# Patient Record
Sex: Female | Born: 1935 | Race: Black or African American | Hispanic: No | Marital: Married | Smoking: Never smoker
Health system: Southern US, Community
[De-identification: ages and names within clinical notes are randomized; demographics above are authoritative.]

## PROBLEM LIST (undated history)

## (undated) DIAGNOSIS — I739 Peripheral vascular disease, unspecified: Secondary | ICD-10-CM

## (undated) DIAGNOSIS — I10 Essential (primary) hypertension: Secondary | ICD-10-CM

## (undated) DIAGNOSIS — R739 Hyperglycemia, unspecified: Secondary | ICD-10-CM

## (undated) DIAGNOSIS — I701 Atherosclerosis of renal artery: Secondary | ICD-10-CM

## (undated) DIAGNOSIS — I071 Rheumatic tricuspid insufficiency: Secondary | ICD-10-CM

## (undated) DIAGNOSIS — I480 Paroxysmal atrial fibrillation: Secondary | ICD-10-CM

## (undated) DIAGNOSIS — I779 Disorder of arteries and arterioles, unspecified: Secondary | ICD-10-CM

## (undated) DIAGNOSIS — I340 Nonrheumatic mitral (valve) insufficiency: Secondary | ICD-10-CM

## (undated) DIAGNOSIS — I773 Arterial fibromuscular dysplasia: Secondary | ICD-10-CM

## (undated) DIAGNOSIS — I4892 Unspecified atrial flutter: Secondary | ICD-10-CM

## (undated) DIAGNOSIS — N183 Chronic kidney disease, stage 3 unspecified: Secondary | ICD-10-CM

## (undated) DIAGNOSIS — N6019 Diffuse cystic mastopathy of unspecified breast: Secondary | ICD-10-CM

## (undated) HISTORY — DX: Essential (primary) hypertension: I10

## (undated) HISTORY — DX: Chronic kidney disease, stage 3 unspecified: N18.30

## (undated) HISTORY — DX: Disorder of arteries and arterioles, unspecified: I77.9

## (undated) HISTORY — PX: HEMORROIDECTOMY: SUR656

## (undated) HISTORY — DX: Unspecified atrial flutter: I48.92

## (undated) HISTORY — DX: Paroxysmal atrial fibrillation: I48.0

## (undated) HISTORY — DX: Nonrheumatic mitral (valve) insufficiency: I34.0

## (undated) HISTORY — DX: Diffuse cystic mastopathy of unspecified breast: N60.19

## (undated) HISTORY — PX: TUBAL LIGATION: SHX77

## (undated) HISTORY — DX: Chronic kidney disease, stage 3 (moderate): N18.3

## (undated) HISTORY — PX: TONSILLECTOMY: SUR1361

## (undated) HISTORY — DX: Peripheral vascular disease, unspecified: I73.9

## (undated) HISTORY — DX: Hyperglycemia, unspecified: R73.9

## (undated) HISTORY — PX: REDUCTION MAMMAPLASTY: SUR839

## (undated) HISTORY — DX: Rheumatic tricuspid insufficiency: I07.1

## (undated) HISTORY — DX: Atherosclerosis of renal artery: I70.1

## (undated) HISTORY — DX: Arterial fibromuscular dysplasia: I77.3

---

## 1998-06-10 ENCOUNTER — Emergency Department (HOSPITAL_COMMUNITY): Admission: EM | Admit: 1998-06-10 | Discharge: 1998-06-10 | Payer: Self-pay | Admitting: Emergency Medicine

## 1998-06-10 ENCOUNTER — Encounter: Payer: Self-pay | Admitting: Emergency Medicine

## 1998-12-19 ENCOUNTER — Encounter: Payer: Self-pay | Admitting: Urology

## 1998-12-19 ENCOUNTER — Encounter: Admission: RE | Admit: 1998-12-19 | Discharge: 1998-12-19 | Payer: Self-pay | Admitting: Urology

## 1999-01-16 ENCOUNTER — Ambulatory Visit (HOSPITAL_BASED_OUTPATIENT_CLINIC_OR_DEPARTMENT_OTHER): Admission: RE | Admit: 1999-01-16 | Discharge: 1999-01-16 | Payer: Self-pay | Admitting: Urology

## 1999-01-31 ENCOUNTER — Other Ambulatory Visit: Admission: RE | Admit: 1999-01-31 | Discharge: 1999-01-31 | Payer: Self-pay | Admitting: Obstetrics and Gynecology

## 1999-01-31 ENCOUNTER — Encounter (INDEPENDENT_AMBULATORY_CARE_PROVIDER_SITE_OTHER): Payer: Self-pay

## 1999-07-17 ENCOUNTER — Other Ambulatory Visit: Admission: RE | Admit: 1999-07-17 | Discharge: 1999-07-17 | Payer: Self-pay | Admitting: Obstetrics and Gynecology

## 1999-09-26 ENCOUNTER — Encounter: Payer: Self-pay | Admitting: Obstetrics and Gynecology

## 1999-09-26 ENCOUNTER — Ambulatory Visit (HOSPITAL_COMMUNITY): Admission: RE | Admit: 1999-09-26 | Discharge: 1999-09-26 | Payer: Self-pay | Admitting: Obstetrics and Gynecology

## 1999-10-30 ENCOUNTER — Encounter (INDEPENDENT_AMBULATORY_CARE_PROVIDER_SITE_OTHER): Payer: Self-pay | Admitting: Specialist

## 1999-10-30 ENCOUNTER — Ambulatory Visit (HOSPITAL_COMMUNITY): Admission: RE | Admit: 1999-10-30 | Discharge: 1999-10-30 | Payer: Self-pay | Admitting: Obstetrics and Gynecology

## 2000-11-30 ENCOUNTER — Other Ambulatory Visit: Admission: RE | Admit: 2000-11-30 | Discharge: 2000-11-30 | Payer: Self-pay | Admitting: Obstetrics and Gynecology

## 2001-05-04 ENCOUNTER — Encounter: Admission: RE | Admit: 2001-05-04 | Discharge: 2001-05-04 | Payer: Self-pay | Admitting: Internal Medicine

## 2001-05-04 ENCOUNTER — Encounter: Payer: Self-pay | Admitting: Internal Medicine

## 2001-05-25 ENCOUNTER — Ambulatory Visit (HOSPITAL_COMMUNITY): Admission: RE | Admit: 2001-05-25 | Discharge: 2001-05-25 | Payer: Self-pay | Admitting: Gastroenterology

## 2002-11-14 ENCOUNTER — Other Ambulatory Visit: Admission: RE | Admit: 2002-11-14 | Discharge: 2002-11-14 | Payer: Self-pay | Admitting: Obstetrics and Gynecology

## 2003-02-16 ENCOUNTER — Ambulatory Visit (HOSPITAL_COMMUNITY): Admission: RE | Admit: 2003-02-16 | Discharge: 2003-02-16 | Payer: Self-pay | Admitting: Internal Medicine

## 2003-03-29 ENCOUNTER — Ambulatory Visit (HOSPITAL_COMMUNITY): Admission: RE | Admit: 2003-03-29 | Discharge: 2003-03-30 | Payer: Self-pay | Admitting: *Deleted

## 2003-11-21 ENCOUNTER — Ambulatory Visit (HOSPITAL_COMMUNITY): Admission: RE | Admit: 2003-11-21 | Discharge: 2003-11-21 | Payer: Self-pay | Admitting: *Deleted

## 2003-12-07 ENCOUNTER — Other Ambulatory Visit: Admission: RE | Admit: 2003-12-07 | Discharge: 2003-12-07 | Payer: Self-pay | Admitting: Obstetrics and Gynecology

## 2004-05-20 ENCOUNTER — Ambulatory Visit (HOSPITAL_COMMUNITY): Admission: RE | Admit: 2004-05-20 | Discharge: 2004-05-20 | Payer: Self-pay | Admitting: *Deleted

## 2004-06-19 ENCOUNTER — Ambulatory Visit (HOSPITAL_COMMUNITY): Admission: RE | Admit: 2004-06-19 | Discharge: 2004-06-20 | Payer: Self-pay | Admitting: *Deleted

## 2004-07-26 ENCOUNTER — Ambulatory Visit (HOSPITAL_COMMUNITY): Admission: RE | Admit: 2004-07-26 | Discharge: 2004-07-26 | Payer: Self-pay | Admitting: *Deleted

## 2004-12-27 ENCOUNTER — Ambulatory Visit (HOSPITAL_COMMUNITY): Admission: RE | Admit: 2004-12-27 | Discharge: 2004-12-27 | Payer: Self-pay | Admitting: *Deleted

## 2005-01-13 ENCOUNTER — Ambulatory Visit (HOSPITAL_BASED_OUTPATIENT_CLINIC_OR_DEPARTMENT_OTHER): Admission: RE | Admit: 2005-01-13 | Discharge: 2005-01-14 | Payer: Self-pay | Admitting: Specialist

## 2005-01-13 ENCOUNTER — Encounter (INDEPENDENT_AMBULATORY_CARE_PROVIDER_SITE_OTHER): Payer: Self-pay | Admitting: *Deleted

## 2005-02-24 ENCOUNTER — Other Ambulatory Visit: Admission: RE | Admit: 2005-02-24 | Discharge: 2005-02-24 | Payer: Self-pay | Admitting: Obstetrics and Gynecology

## 2005-07-16 ENCOUNTER — Ambulatory Visit (HOSPITAL_COMMUNITY): Admission: RE | Admit: 2005-07-16 | Discharge: 2005-07-16 | Payer: Self-pay | Admitting: *Deleted

## 2005-07-21 ENCOUNTER — Ambulatory Visit (HOSPITAL_COMMUNITY): Admission: RE | Admit: 2005-07-21 | Discharge: 2005-07-21 | Payer: Self-pay | Admitting: *Deleted

## 2005-07-23 ENCOUNTER — Ambulatory Visit (HOSPITAL_COMMUNITY): Admission: RE | Admit: 2005-07-23 | Discharge: 2005-07-23 | Payer: Self-pay | Admitting: *Deleted

## 2005-11-05 ENCOUNTER — Encounter: Admission: RE | Admit: 2005-11-05 | Discharge: 2005-11-05 | Payer: Self-pay | Admitting: Cardiology

## 2006-01-06 HISTORY — PX: RENAL ARTERY STENT: SHX2321

## 2006-01-23 ENCOUNTER — Ambulatory Visit (HOSPITAL_COMMUNITY): Admission: RE | Admit: 2006-01-23 | Discharge: 2006-01-23 | Payer: Self-pay | Admitting: *Deleted

## 2006-05-21 ENCOUNTER — Ambulatory Visit (HOSPITAL_COMMUNITY): Admission: RE | Admit: 2006-05-21 | Discharge: 2006-05-21 | Payer: Self-pay | Admitting: Interventional Cardiology

## 2006-06-15 ENCOUNTER — Encounter: Admission: RE | Admit: 2006-06-15 | Discharge: 2006-06-15 | Payer: Self-pay | Admitting: Internal Medicine

## 2006-08-26 ENCOUNTER — Ambulatory Visit: Payer: Self-pay | Admitting: Vascular Surgery

## 2006-09-01 ENCOUNTER — Ambulatory Visit (HOSPITAL_COMMUNITY): Admission: RE | Admit: 2006-09-01 | Discharge: 2006-09-01 | Payer: Self-pay | Admitting: *Deleted

## 2006-09-17 ENCOUNTER — Ambulatory Visit: Payer: Self-pay | Admitting: *Deleted

## 2006-09-23 ENCOUNTER — Ambulatory Visit: Payer: Self-pay | Admitting: *Deleted

## 2006-09-23 ENCOUNTER — Ambulatory Visit (HOSPITAL_COMMUNITY): Admission: RE | Admit: 2006-09-23 | Discharge: 2006-09-23 | Payer: Self-pay | Admitting: *Deleted

## 2007-03-11 ENCOUNTER — Ambulatory Visit: Payer: Self-pay | Admitting: *Deleted

## 2008-02-16 ENCOUNTER — Ambulatory Visit: Payer: Self-pay | Admitting: *Deleted

## 2008-03-16 ENCOUNTER — Ambulatory Visit: Payer: Self-pay | Admitting: *Deleted

## 2008-06-28 ENCOUNTER — Encounter: Admission: RE | Admit: 2008-06-28 | Discharge: 2008-06-28 | Payer: Self-pay | Admitting: Cardiology

## 2009-04-03 ENCOUNTER — Ambulatory Visit: Payer: Self-pay | Admitting: Vascular Surgery

## 2009-05-29 ENCOUNTER — Ambulatory Visit: Payer: Self-pay | Admitting: Vascular Surgery

## 2009-11-18 ENCOUNTER — Emergency Department (HOSPITAL_COMMUNITY)
Admission: EM | Admit: 2009-11-18 | Discharge: 2009-11-18 | Payer: Self-pay | Source: Home / Self Care | Admitting: Family Medicine

## 2009-11-20 ENCOUNTER — Emergency Department (HOSPITAL_COMMUNITY): Admission: EM | Admit: 2009-11-20 | Discharge: 2009-11-20 | Payer: Self-pay | Admitting: Family Medicine

## 2010-01-27 ENCOUNTER — Encounter: Payer: Self-pay | Admitting: *Deleted

## 2010-03-08 ENCOUNTER — Other Ambulatory Visit: Payer: Self-pay

## 2010-03-08 ENCOUNTER — Ambulatory Visit: Payer: Self-pay | Admitting: Vascular Surgery

## 2010-03-22 ENCOUNTER — Other Ambulatory Visit (INDEPENDENT_AMBULATORY_CARE_PROVIDER_SITE_OTHER): Payer: Medicare Other

## 2010-03-22 ENCOUNTER — Encounter (INDEPENDENT_AMBULATORY_CARE_PROVIDER_SITE_OTHER): Payer: Medicare Other | Admitting: Vascular Surgery

## 2010-03-22 DIAGNOSIS — I701 Atherosclerosis of renal artery: Secondary | ICD-10-CM

## 2010-03-22 DIAGNOSIS — Z48812 Encounter for surgical aftercare following surgery on the circulatory system: Secondary | ICD-10-CM

## 2010-03-25 NOTE — Assessment & Plan Note (Signed)
OFFICE VISIT  Donna [name], Donna [name] DOB:  1935/03/18                                       03/22/2010 EGBTD#:[id number]  HISTORY OF PRESENT ILLNESS:  This is a 75 year old female who presents for follow-up on a history of a right renal artery stenting.  At this point the patient notes no changes in her medication regimen, with a normal blood pressure systolically in the 1-teens.  In fact, her regimen has actually decreased in the regards to the number of medications.  She denies any change in her renal function, is urinating as usual.  PAST MEDICAL HISTORY:  Hypertension and right renal artery stenosis.  PAST SURGICAL HISTORY:  Right renal artery stenting, hemorrhoidectomy, tonsillectomy, adenoidectomy and bilateral tubal ligation.  SOCIAL HISTORY:  No tobacco, alcohol or drug use.  FAMILY HISTORY:  Mother had a stroke at 37 and father had abdominal aortic aneurysm and died at 65.  MEDICATIONS:  Avalide, metoprolol, aspirin, Norvasc and a whole slew of vitamins.  ALLERGIES:  Tagamet which gave her hives.  REVIEW OF SYSTEMS:  Today, she noted stent in right renal artery. Otherwise the rest of her review of systems was noted be negative.  PHYSICAL EXAMINATION:  Blood pressure 160/73, heart rate of 50, respirations were 12, temperatures 97.8. General:  Well-developed, well-nourished, no apparent distress.  Alert and oriented x3.  Head:  Normocephalic, atraumatic. ENT:  Hearing grossly intact.  Oropharynx without any erythema or exudate.  The nares without any erythema or drainage. Neck:  Supple neck.  No nuchal rigidity.  No JVD. Eyes:  Pupils were equal, round, reactive to light.  Extraocular movements are intact. Pulmonary:  Symmetric expansion.  Good air movement.  No rales, rhonchi or wheezing. Cardiac:  Regular rate rhythm.  Normal S1-S2.  No murmurs, rubs, thrills or gallops.  Vascular:  Bilateral upper extremity pulses were easily palpable as were  the carotids.  There was no bruit on either side.  Her aorta was not palpable.  Femorals were palpable bilaterally as were the pedals bilaterally. Abdomen:  Soft abdomen, nontender, nondistended.  No guarding, no rebound, no hepatosplenomegaly.  There was no bruit in any portion of this patient's abdomen.  I tried to feel her abdominal aorta but I could not feel the pulsation. Musculoskeletal:  Motor strength was 5/5 in all extremities.  There were no obvious ischemic changes in any extremity or any ulceration. Neuro:  Cranial nerves II-XII were intact.  Motor was as listed above. Sensation was grossly intact in all extremities. Psych:  Judgment was intact and affect appropriate for clinical situation. Skin:  No ulcers or any type or rashes noted. Lymphatic:  No cervical, axillary or inguinal lymphadenopathy.  NONINVASIVE VASCULAR IMAGING:  She had renal duplex completed as well as aortic studies.  The aorta is widely patent with normal velocities.  The celiac artery and SMA are widely patent.  Previously there was an elevated velocity in the SMA but this is not reproduced today.  In terms of the renal artery, the length of the renal artery has changed.  The right side with the stent is actually increased from 7.58 cm up to a 9.5 cm.  On the left side it is from 10.2 cm down to 9.1 cm.  The RAR ratio on the right side is 2.7, on the left it was 3.8.  Velocities within the  stent on the right side went up to 229 c/s versus 182 c/s previously. RAR ratios and resistive indices are pretty much unchanged from previous.  Interpretation would be a patent renal artery stent.  The velocities in the mid aorta is about 76 c/s, so this would suggest that there is no significant stenosis within the stent.  There is elevation in the left renal artery compared to previous exam.  MEDICAL DECISION MAKING:  This is a 75 year old female on stable blood pressure regimen, actually having decreased that  number of agents she needs.  Even though there is an elevation in the left renal artery velocities, this is not clinically consistent with a significant left renal artery stenosis, as in fact the patient is decreasing in number of medications.  I recommended that we just repeat the studies in 3 months. If the kidney continues to decrease in size at that point, possible intervention would be necessary.  Previously when she was angio'd in 2008, there was absolutely no evidence of disease on this side.  In regards to the right side, the stents are patent at this point, and I think no need for immediate intervention is necessary, as the RAR ratios are less than 3.5 which is consistent with less than 60% stenosis.  Thank you for giving [place] the opportunity to participate in the care of this patient.    Fransisco Hertz, MD Electronically Signed  BLC/MEDQ  D:  03/22/2010  T:  03/25/2010  Job:  [id number]  cc:   Candyce Churn, M.D.

## 2010-03-28 NOTE — Procedures (Unsigned)
RENAL ARTERY DUPLEX EVALUATION  INDICATION:  Followup right renal artery stent placed 09/23/2006.  HISTORY: Diabetes: Cardiac: Hypertension:  Yes. Smoking:  RENAL ARTERY DUPLEX FINDINGS: Aorta-Proximal:  83 cm/s Aorta-Mid:  76 cm/s Aorta-Distal:  114 cm/s Celiac Artery Origin:  142 cm/s SMA Origin:  149 cm/s                                   RIGHT               LEFT Renal Artery Origin:             NV                  122/31 cm/s Renal Artery Proximal:           229/43 (stent) cm/s 269/58 cm/s Renal Artery Mid:                183/35 cm/s         317/47 cm/s Renal Artery Distal:             92/21 cm/s          259/20 cm/s Hilar Acceleration Time (AT):    54                  44 Renal-Aortic Ratio (RAR):        2.7                 3.8 Kidney Size:                     9.51 cm             9.13 cm End Diastolic Ratio (EDR): Resistive Index (RI):            0.77/0.75           0.73  IMPRESSION: 1. Widely patent right renal artery stent without stenosis or     hyperplasia observed. 2. Elevated velocities in the left renal artery that are significantly     higher than previous exam of 04/03/2009. 3. Previously reported superior mesenteric artery stenosis could not     be observed, today's velocities are within normal limits. 4. The resistive indices on the right are slightly increased,     resistive indices on the left are within normal limits. 5. Bilateral kidney size is within normal limits.  ___________________________________________ Fransisco Hertz, MD  LT/MEDQ  D:  03/22/2010  T:  03/22/2010  Job:  [id number]

## 2010-04-11 ENCOUNTER — Other Ambulatory Visit: Payer: Self-pay | Admitting: Cardiology

## 2010-04-11 ENCOUNTER — Ambulatory Visit
Admission: RE | Admit: 2010-04-11 | Discharge: 2010-04-11 | Disposition: A | Discharge status: Home / Self Care | Payer: Medicare Other | Source: Ambulatory Visit | Attending: Cardiology | Admitting: Cardiology

## 2010-04-11 DIAGNOSIS — IMO0002 Reserved for concepts with insufficient information to code with codable children: Secondary | ICD-10-CM

## 2010-05-13 ENCOUNTER — Encounter: Payer: Self-pay | Admitting: Vascular Surgery

## 2010-05-21 NOTE — Op Note (Signed)
NAMEMARJON, Donna [name]                ACCOUNT NO.:  [id number]   MEDICAL RECORD NO.:  [id number]          PATIENT TYPE:  AMB   LOCATION:  SDS                          FACILITY:  [facility]   PHYSICIAN:  Balinda Quails, M.D.    DATE OF BIRTH:  Sep 22, 1935   DATE OF PROCEDURE:  09/23/2006  DATE OF DISCHARGE:  09/23/2006                               OPERATIVE REPORT   SURGEON:  Denman George, M.D.   DIAGNOSIS:  Renovascular disease, possible right renal artery occlusion.   PROCEDURE:  1. Abdominal aortogram.  2. Selective right renal arteriogram.   ACCESS:  Right common femoral artery 5-French sheath.   CONTRAST:  Visipaque 40 mL.   COMPLICATIONS:  None apparent.   CLINICAL NOTE:  Grayson White is a 75 year old female with a history of  known renovascular disease.  She has had previous stent placed on the  right main renal artery which was complication by restenosis and repeat  angioplasty.  These procedures were carried out by Dr. Park Liter.  She is followed now in the [facility] office and recently underwent renal  Doppler suspicious for possible occlusion of the right renal artery.  Brought to the cath lab at this time for diagnostic workup.   PROCEDURE NOTE:  The patient was brought to the cath lab in stable  condition.  Placed in supine position.  Both groins prepped and draped  in a sterile fashion.  Administered 25 mcg of fentanyl and 1 mg of  Versed intravenously.   Skin and subcutaneous tissue right groin instilled with 1% Xylocaine.  Needle easily induced right common femoral artery.  A 0.035 J-wire  passed through the needle into the mid abdominal aorta.  Needle removed,  5-French sheath advanced over the guidewire, dilator removed and sheath  flushed with heparin saline solution.   Standard pigtail catheter advanced over the guidewire to the mid  abdominal aorta.  Standard AP mid abdominal aortogram obtained.  This  revealed a widely patent left renal artery with early  branching.  The  right main renal artery did reveal a stent from the ostium to the  proximal third.  There was evidence of restenosis within the right main  renal artery.  It appeared to be patent.   Guidewire then used to exchange the catheter for an IMA catheter.  The  IMA catheter engaged in the right renal artery.  With hand injection of  contrast, right renal arteriography obtained.  This revealed a patent  right main renal artery.  Approximately a 50 to 60% long tubular  restenosis within the stent.   This completed the arteriogram procedure.  No intervention was  undertaken.  Guidewire reinserted and catheter removed.  Right femoral  sheath removed.  No apparent complications.   FINAL IMPRESSION:  1. In-stent restenosis right main renal artery approximately 50 to      60%.  2. Widely patent left main renal artery.   DISPOSITION:  The patient has been informed of these results along with  her husband.  Follow-up will be arranged for 6 months in the office.  Balinda Quails, M.D.  Electronically Signed     PGH/MEDQ  D:  09/23/2006  T:  09/24/2006  Job:  [id number]   cc:   Mindi Slicker. Lowell Guitar, M.D.

## 2010-05-21 NOTE — Procedures (Signed)
RENAL ARTERY DUPLEX EVALUATION   INDICATION:  Followup of a right renal stent.   HISTORY:  Diabetes:  No.  Cardiac:  No.  Hypertension:  Yes.  Smoking:  No.   RENAL ARTERY DUPLEX FINDINGS:  Aorta-Proximal:  119 cm/s  Aorta-Mid:  95 cm/s  Aorta-Distal:  114 cm/s  Celiac Artery Origin:  166 cm/s  SMA Origin:  312 cm/s                                    RIGHT               LEFT  Renal Artery Origin:             142 cm/s            51 cm/s  Renal Artery Proximal:           182 cm/s            51 cm/s  Renal Artery Mid:                224 cm/s            63 cm/s  Renal Artery Distal:             130 cm/s            126 cm/s  Hilar Acceleration Time (AT):  Renal-Aortic Ratio (RAR):        1.8                 1.05  Kidney Size:                     7.58                10.2  End Diastolic Ratio (EDR):  Resistive Index (RI):            68                  74   IMPRESSION:  1. Patent right renal artery with elevated velocity of 224 in the mid      renal.  2. Left renal artery patent with no evidence of stenosis.  3. 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  4. Poor visualization of the right kidney due to bowel gas pattern.   ___________________________________________  Quita Skye. Hart Rochester, M.D.   CJ/MEDQ  D:  04/03/2009  T:  04/04/2009  Job:  [id number]

## 2010-05-21 NOTE — Procedures (Signed)
RENAL ARTERY DUPLEX EVALUATION   INDICATION:  Followup evaluation of renal artery disease.  The patient  has a history of right renal artery stenting and repair of in-stent  restenosis with angioplasty.   HISTORY:  Diabetes:  No.  Cardiac:  No.  Hypertension:  Yes.  Smoking:  No.   RENAL ARTERY DUPLEX FINDINGS:  Aorta-Proximal:  65 cm/s  Aorta-Mid:  76 cm/s  Aorta-Distal:  99 cm/s  Celiac Artery Origin:  148 cm/s  SMA Origin:  177 cm/s                                    RIGHT               LEFT  Renal Artery Origin:             Occluded.           48/9.9  Renal Artery Proximal:           Occluded.           65/15  Renal Artery Mid:                Occluded.           345/74  Renal Artery Distal:             Occluded.           260/52  Hilar Acceleration Time (AT):    Occluded.  Renal-Aortic Ratio (RAR):        Occluded.           5.3  Kidney Size:                     10.6 cm.            10.1 cm  End Diastolic Ratio (EDR):       0.25 to 0.19.       0.31 to 0.18  Resistive Index (RI):            Occluded.           0.77   IMPRESSION:  The right renal artery appears to be occluded throughout  the vessel.  Unable to rule out possible high-grade stenosis with  decreased flow state.  The occlusion does not appear chronic in nature  due to the kidney size and the artery not appearing atretic with  intraluminal echoes visualized.  Kidney still has venous return.   The left renal artery appears to have greater than 60% stenosis at the  mid to distal portion with a renal to aortic ratio of 5.3.  There is  some angulation of the vessel in this area which may be contributing to  the elevated velocities.   ___________________________________________  P. Liliane Bade, M.D.   MC/MEDQ  D:  08/26/2006  T:  08/27/2006  Job:  [id number]

## 2010-05-21 NOTE — Assessment & Plan Note (Signed)
OFFICE VISIT   AZHAR, YOGI  DOB:  07/01/1935                                       03/16/2008  NWGNF#:[id number]   The patient continues to follow up on a yearly basis with renal duplex  scan.  She had a right renal artery stent placed by Dr. Park Liter  with evidence of restenosis within the stent.  She follows up on a  yearly basis here with renal duplex scan.   Her kidneys are well maintained in size at 10.7 and 10 cm in the right  and left respectively.  Duplex of the renal arteries reveals 46%  restenosis in the right renal artery.  Left renal artery is widely  patent.   Blood pressure has been reasonably well controlled.   CURRENT MEDICATIONS:  1. Avalide 300/25 one tablet daily.  2. Metoprolol 25 mg daily.  3. Aspirin 81 mg daily.  4. Norvasc 1 tablet daily.  5. Takes a number of vitamins and other supplements.   She is allergic to Tagamet.   The patient appears well.  BP 111/68, pulse 62 per minute.  She is alert  and oriented, in no distress.  Abdomen is soft and nontender.  No masses  or organomegaly.  Normal bowel sounds without bruits.  Femoral pulses 2+  bilaterally.   Renal duplex scan reveals mild restenosis right renal stent.  Well-  maintained kidney size.  Blood pressure is well controlled.  Will plan  follow-up in 1 year with repeat duplex scan.   Balinda Quails, M.D.  Electronically Signed   PGH/MEDQ  D:  03/16/2008  T:  03/17/2008  Job:  [id number]   cc:   Osvaldo Shipper. Spruill, M.D.  Mindi Slicker. Lowell Guitar, M.D.  Candyce Churn, M.D.

## 2010-05-21 NOTE — Assessment & Plan Note (Signed)
OFFICE VISIT   TANYIAH, LAURICH  DOB:  [birth year]                                       03/11/2007  AVWUJ#:[id number]   The patient returns to the office today having undergone a renal duplex  scan today.  This revealed approximately 40-60% right renal artery  stenosis within the stent.  Left renal artery appears widely patent.  Bilateral kidney size within normal limits at 10.4 cm.  Resistance  indices within normal limits bilaterally.   The patient has been doing generally well.  No complaints.   CURRENT MEDICATIONS:  Avalide 300/25 once daily, Toprol 25 mg daily,  aspirin 81 mg daily.  Multiple vitamins including A, C, E, D, B complex,  calcium, garlic, grape seed extract, and omega 3 fish oil.   Allergic to Tagamet, which causes hives.   The patient appears generally well.  Alert and oriented, in no acute  distress.  Abdomen is soft and nontender.  No masses or organomegaly.  No abdominal bruits.  2+ femoral pulses bilaterally.  BP is 176/88,  pulse 54 per minute, O2 saturation 100% on room air.   These findings certainly are encouraging.  No evidence of progression of  right renal artery stenosis.  Kidney size well-maintained.  Will plan  followup in 1 year with a basic metabolic panel and repeat duplex scan.   Balinda Quails, M.D.  Electronically Signed   PGH/MEDQ  D:  03/11/2007  T:  03/12/2007  Job:  [id number]   cc:   Osvaldo Shipper. Spruill, M.D.  Mindi Slicker. Lowell Guitar, M.D.  Candyce Churn, M.D.

## 2010-05-21 NOTE — Procedures (Signed)
RENAL ARTERY DUPLEX EVALUATION   INDICATION:  Followup, right renal artery stent.   HISTORY:  Diabetes:  No.  Cardiac:  No.  Hypertension:  Yes.  Smoking:  No.   RENAL ARTERY DUPLEX FINDINGS:  Aorta-Proximal:  107 cm/s  Aorta-Mid:  119 cm/s  Aorta-Distal:  110 cm/s  Celiac Artery Origin:  117 cm/s  SMA Origin:  211 cm/s                                    RIGHT               LEFT  Renal Artery Origin:             189 cm/s            102 cm/s  Renal Artery Proximal:           247 cm/s            98 cm/s  Renal Artery Mid:                224 cm/s            98 cm/s  Renal Artery Distal:             142 cm/s            106 cm/s  Hilar Acceleration Time (AT):  Renal-Aortic Ratio (RAR):        2.3                 0.95  Kidney Size:                     10.48 cm            10.34 cm  End Diastolic Ratio (EDR):  Resistive Index (RI):            0.76                0.65   IMPRESSION:  1. Approximately 40-60% stenosis noted in the right renal stent.  2. Left renal artery appears patent with no evidence of stenosis.  3. Bilateral kidneys measured within normal limits.  Right measured      10.48 cm.  The left measured 10.34 cm.  4. Bilateral resistive index is within normal limits.   ___________________________________________  P. Liliane Bade, M.D.   MG/MEDQ  D:  03/11/2007  T:  03/11/2007  Job:  [id number]

## 2010-05-21 NOTE — Procedures (Signed)
RENAL ARTERY DUPLEX EVALUATION   INDICATION:  Followup right renal stent.   HISTORY:  Diabetes:  No.  Cardiac:  No.  Hypertension:  Yes.  Smoking:  No.   RENAL ARTERY DUPLEX FINDINGS:  Aorta-Proximal:  118 cm/s  Aorta-Mid:  85 cm/s  Aorta-Distal:  70 cm/s  Celiac Artery Origin:  253 cm/s  SMA Origin:  259 cm/s                                    RIGHT               LEFT  Renal Artery Origin:             226 cm/s            95 cm/s  Renal Artery Proximal:           254 cm/s            128 cm/s  Renal Artery Mid:                139 cm/s            137 cm/s  Renal Artery Distal:             143 cm/s            145 cm/s  Hilar Acceleration Time (AT):    m/s2                m/s2  Renal-Aortic Ratio (RAR):        2.1                 1.2  Kidney Size:                     10.7 cm             10.0 cm  End Diastolic Ratio (EDR):  Resistive Index (RI):            0.73                0.65   IMPRESSION:  1. 40-60% stenosis noted in the right renal artery stent.  2. The left renal artery appears patent with no evidence of stenosis.  3. Bilateral kidneys measure within normal limits, the right measuring      10.7, left measuring 10.0.  4. Bilateral resistive index appears to be within normal limits.   ___________________________________________  P. Liliane Bade, M.D.   MG/MEDQ  D:  03/17/2008  T:  03/17/2008  Job:  [id number]

## 2010-05-21 NOTE — Assessment & Plan Note (Signed)
OFFICE VISIT   Donna [name], Donna [name]  DOB:  [birth year]                                       05/29/2009  EAVWU#:[id number]   CHIEF COMPLAINT:  Right-sided leg pain.   Patient is a 75 year old woman who had a real stent placed by Dr. Pablo Ledger.  She had an angiogram done by Dr. Madilyn Fireman in 2008 and has had  serial vascular labs to check the right renal stent.  The last renal  duplex scan was done on 04/03/2009 and showed a patent right renal  artery with elevated velocity of 224 in the mid renals.  Left renal  artery patent with no evidence of stenosis.  She also had a superior  mesenteric artery stenosis with elevated velocities of 312.  Her kidney  size is 7.6.  Renal-aortic ratio was 1.8, and the resistance index was  68.  These were pretty much stable.  Her kidney size was slightly  smaller, from 10.7 previously.   The patient states that on this past Sunday she was driving back from  the beach, and she noted some right-sided pain above the hip to the  posterior back.  She said it was quite painful on Sunday but did not  keep her awake at night.  Monday the pain was less, and it was more of a  dull ache.  She had no discoloration or bleeding with urination, and she  has been able to do her water aerobics at the gym without any  discomfort.  She awoke this morning with no pain in the area but wanted  to be seen regarding this issue.   Chronic problems are hypertension.  She has no history of kidney stones.   PHYSICAL EXAMINATION:  This is a well-developed, well-nourished woman in  no acute distress.  Her sats were 100%.  Her heart rate was 61.  Her  respiratory rate was 10.   She had no pain or tenderness over the right flank.  The area was soft  and nontender.   ALLERGIES:  She has no known allergies.   Meds include aspirin, blood pressure medications, and vitamins.  She has  no new medical issues.   ASSESSMENT/PLAN:  Right-sided pain in a patient  with a history of a  patent right renal stent.  The pain is now resolved with no change or  discoloration with urination.   PLAN:  The patient will notify us if the pain returns or becomes more  severe, and we will set up a renal duplex scan to check her stent.   Della Goo, PA-C   Quita Skye. Hart Rochester, M.D.  Electronically Signed   RR/MEDQ  D:  05/29/2009  T:  05/29/2009  Job:  [id number]

## 2010-05-21 NOTE — Assessment & Plan Note (Signed)
OFFICE VISIT   Donna [name], Donna [name]  DOB:  [birth year]                                       09/17/2006  EAVWU#:[id number]   Patient returned to the office today with the results of her most recent  studies.  She underwent a renal duplex scan, carried out on August 26, 2006, revealing possible occlusion of her right renal artery.  There  was, however, a well-maintained kidney size at 10.6 cm along with  evidence of right renal venous flow.  This may reveal a high-grade  proximal stenosis with decreased flow.  Duplex evaluation of the left  renal revealed a greater than 60% stenosis with angulation of the vessel  and well-maintained kidney size at 10.1 cm.   A split renal function study was carried out, which revealed stable  function of 40% on the right and 60% on the left.   I discussed these results further with the patient and her husband,  along with Dr. Lowell Guitar.  I am concerned that there may be a critical  stenosis of the right renal artery, which is preocclusive.  Considering  she has 40% function from her right kidney, which is well maintained in  size, I do think it would be prudent to further investigate this with  arteriography.  She will be protected with Mucomyst and bicarbonate drip  and CO2 use if required.   This is scheduled for September 23, 2006 at [facility].  She  will begin Plavix 75 mg daily four days prior to the procedure.   Balinda Quails, M.D.  Electronically Signed   PGH/MEDQ  D:  09/17/2006  T:  09/18/2006  Job:  [id number]   cc:   Osvaldo Shipper. Spruill, M.D.  Mindi Slicker. Lowell Guitar, M.D.  Candyce Churn, M.D.

## 2010-05-24 NOTE — Op Note (Signed)
Donna [name], Donna [name]                ACCOUNT NO.:  [id number]   MEDICAL RECORD NO.:  [id number]          PATIENT TYPE:  AMB   LOCATION:  DSC                          FACILITY:  [facility]   PHYSICIAN:  Earvin Hansen L. Truesdale, M.D.DATE OF BIRTH:  12-20-1935   DATE OF PROCEDURE:  DATE OF DISCHARGE:                                 OPERATIVE REPORT   75 year old lady with severe macromastia, back and shoulder pain secondary  to large, pendulous breasts.  She has been resistant to conservative  treatment which has caused increased intertriginous changes, pitting of the  shoulders, and neck pain.   PROCEDURES PLANNED:  Bilateral breast reduction using the inferior pedicle  technique.   ANESTHESIA:  General.   Preoperatively the patient was set up and drawn for the bilateral breast  reduction remarking the nipple areolar complexes for 20 cm from the  suprasternal notch.  Left breast is smaller than her right.  She then  underwent general anesthesia, intubated orally.  Prep was done to the  chest/breast areas in a routine fashion using Hibiclens soap and solution  and walled off with sterile towels and draped so as to make a sterile field.  0.25% Xylocaine with epinephrine 1:400,000 concentration was injected  locally at 150 mL per side.  This was allowed to set up and the wounds were  then scored with #15 blades.  The skin over the inferior pedicle was de-  epithelialized with #20 blades.  After proper hemostasis the medial and  lateral fatty dermal pedicles were excised down to underlying pectoralis  fascia and more accessory breast tissue was removed as well as in axillary  regions.  After proper hemostasis a new keyhole area was debulked.  The  flaps were transposed and stayed with 3-0 Prolene.  Subcutaneous closure was  done with 3-0 Monocryl x2 layers and then a running subcuticular stitch of 3-  0 Monocryl and 5-0 Monocryl throughout the inverted T.  The nipple areolar  complex was closed  with 5-0 Monocryl.  The wounds were drained with a #10  Blake drain fully fluted which was placed in the depths of the wound and  brought out to the lateralest most portion of the incision and secured with  3-0 Prolene.  The wounds were cleansed.  At end of the procedure patient had  excellent symmetry, good blood supply to the nipple areolar complexes.  Steri-Strips and soft dressing were applied.  She was then taken to recovery  in excellent condition.      Yaakov Guthrie. Shon Hough, M.D.  Electronically Signed     GLT/MEDQ  D:  01/13/2005  T:  01/13/2005  Job:  [id number]

## 2010-05-24 NOTE — Op Note (Signed)
[facility]  Patient:    Donna [name], Donna [name]                       MRN: [id number] Proc. Date: 10/30/99 Adm. Date:  [id number] Attending:  Dierdre Forth Pearline                           Operative Report  PREOPERATIVE DIAGNOSIS:       Postmenopausal bleeding.  POSTOPERATIVE DIAGNOSIS:      Postmenopausal bleeding.  OPERATION:                    Diagnostic hysteroscopy, diagnostic dilation and curettage.  ANESTHESIA:                   General.  ESTIMATED BLOOD LOSS:         Less than 50 cc.  COMPLICATIONS:                None.  FINDINGS:                     There were no specific intrauterine lesions noted.  There was significant cervical stenosis.  PROCEDURE:                    The patient was taken to the operating room after appropriate identification and placed on the operating table.  After the attainment of adequate general anesthesia she was placed in the lithotomy position.  The perineum and vagina were prepped with multiple layers of Betadine and a Red Robinson catheter used to empty the bladder.  The perineum was draped as a sterile field.  A Graves speculum was placed in the vagina and a single tooth tenaculum placed on the anterior cervix.  The uterus sounded to 7 cm.  The cervix was then dilated to accommodate the diagnostic hysteroscope and this was used to visualize the endometrial cavity.  The cervix could not be further dilated to accommodate the resectoscope, however, sharp curettage of the endometrial cavity was carried out with minimal endometrial curettings.  Further diagnostic evaluation of the endometrial cavity with the hysteroscope revealed no distinctive lesions.  The hysteroscope was then removed and a laceration of the cervix repaired with figure-of-eight sutures of 0 Vicryl.  Hemostasis was noted to be adequate. Specimens to pathology were endometrial curettings.  Fluid deficit was 160 cc. The patient was then  awakened from general anesthesia after removal of all instruments from the vagina and taken to the recovery room in satisfactory condition having tolerated the procedure well with sponge and instrument counts correct. DD:  10/30/99 TD:  10/30/99 Job: [id number]

## 2010-05-24 NOTE — Op Note (Signed)
[facility]  Patient:    Donna [name]                        MRN: [id number] Proc. Date: 01/16/99 Adm. Date:  [id number] Attending:  Lindaann Slough CC:         Pearla Dubonnet, M.D.                           Operative Report  PREOPERATIVE DIAGNOSIS:  Recurrent urinary tract infection.  POSTOPERATIVE DIAGNOSIS:  Recurrent urinary tract infection.  OPERATION PERFORMED:  Cystoscopy and urethral dilation.  SURGEON:  Lindaann Slough, M.D.  ANESTHESIA:  General.  INDICATIONS FOR PROCEDURE:  The patient is a 75 year old female who has a history of recurrent urinary tract infection.  Renal ultrasound shows normal upper tracts. The patient stated that in her past history, she had a cystoscopy and bladder biopsy but she does not recall exactly what the diagnosis was.  She is scheduled today for cystoscopy.  DESCRIPTION OF PROCEDURE:  Under general anesthesia, the patient was prepped and draped and placed in dorsal lithotomy position.  A #21 Wappler cystoscope was inserted in the bladder.  The bladder mucosa is normal.  There is no stone or tumor in the bladder.  The ureteral orifices are in normal position and shape with clear efflux.  There is no evidence of submucosal hemorrhage after refilling the bladder. The cystoscope was then removed.  The urethra was dilated with a #30 [place].  The patient tolerated the procedure well and left the operating room in satisfactory condition to post anesthesia care unit. DD:  01/16/99 TD:  01/16/99 Job: [id number]

## 2010-05-24 NOTE — H&P (Signed)
[facility]  Patient:    Donna [name], Donna [name]                         MRN: [id number] Adm. Date:  10/30/99 Attending:  Erie Noe P. Pennie Rushing, M.D.                         History and Physical  HISTORY OF PRESENT ILLNESS:   The patient is a 75 year old black married female, para 4-0-1-4, who presents for hysteroscopy and D&C after having had an episode of postmenopausal bleeding, and a sonohistogram showing inadequate evaluation of the endometrial cavity, but also, an irregular shape of the endometrial cavity at the fundal region.  In light of this irregularity, D&C and hysteroscopy were recommended.  The patient underwent D&C hysteroscopy in 1988 for postmenopausal bleeding with negative pathology and negative findings at the time of hysteroscopy.  PAST MEDICAL HISTORY:         Obstetrical - four vaginal deliveries and one spontaneous abortion.  Gynecologic - menarche as a teenager with regular menses up through age 45.  Last Pap smear was July 2001, showing benign reactive ________ changes with no significant atypia.  Last mammogram was in October of 2000, and was normal.  PAST SURGICAL HISTORY:        Tonsillectomy in 1968, hemorrhoidectomy in 1968, tubal ligation in 1975, diagnostic laparoscopy with lysis of adhesion in 1976.  BLOOD TRANSFUSIONS:           None.  CURRENT MEDICATION:           Vaseretic and Prempro, and Premarin vaginal cream, vitamin B6, calcium, and vitamin E.  DRUG SENSITIVITIES:           None.  REVIEW OF SYSTEMS:            Negative except as mentioned above.  PHYSICAL EXAMINATION:  LUNGS:                        Clear.  HEART:                        Regular rate and rhythm.  ABDOMEN:                      Soft without masses or organomegaly.  PELVIC:                       EGBUS within normal limits.  The vagina is atrophic.  Cervix is without gross lesions.  The uterus is normal size, shape, and consistency, anterior, mobile,  and nontender.  Adnexae no masses. Rectovaginal no masses.  IMPRESSION:                   Postmenopausal bleeding with an abnormal                               sonohistogram.  DISPOSITION:                  D&C and hysteroscopy is recommended, and the patient understands the risks of anesthesia, bleeding, infection, and damage to adjacent organs, and wishes to proceed at [facility] on October 30, 1999. DD:  10/13/99 TD:  10/13/99 Job: [id number]

## 2010-05-24 NOTE — Procedures (Signed)
[place]  Patient:    Donna [name], Donna [name] Visit Number: [id number] MRN: [id number]          Service Type: END Location: ENDO Attending Physician:  Dennison Bulla Ii Dictated by:   Verlin Grills, M.D. Proc. Date: 05/25/01 Admit Date:  05/25/2001 Discharge Date: 05/25/2001   CC:         Pearla Dubonnet, M.D.   Procedure Report  PROCEDURE:  Esophagogastroduodenoscopy, gastric antrum biopsy, and screening colonoscopy.  REFERRING PHYSICIAN:  Pearla Dubonnet, M.D.  INDICATIONS FOR PROCEDURE:  The patient is a 75 year old female born [birth year].  The patient has non-ulcer dyspepsia.  She has received appropriate H. pylori eradication antibiotic therapy.  Her dyspeptic symptoms persist and she was scheduled for diagnostic esophagogastroduodenoscopy.  The patients 1998 flexible proctosigmoidoscopy was normal.  She is due for her first screening colonoscopy with polypectomy to prevent colon cancer.  ENDOSCOPIST:  Verlin Grills, M.D.  PREMEDICATION:  Fentanyl 75 mcg and Versed 10 mg.  DESCRIPTION OF PROCEDURE - ESOPHAGOGASTRODUODENOSCOPY:  After obtaining informed consent, the patient was placed in the left lateral decubitus position.  I administered intravenous fentanyl and intravenous Versed to achieve conscious sedation for the procedure.  The patients blood pressure, oxygen saturation, and cardiac rhythm were monitored throughout the procedure and documented in the medical record.  The Olympus gastroscope was passed through the posterior hypopharynx into the proximal esophagus without difficulty.  The hypopharynx and larynx appeared normal.  I did not visualize the vocal cords.  Esophagoscopy:  The proximal, mid, and lower segments of the esophagus appeared completely normal.  Gastroscopy:  Retroflexed view of the gastric cardia and fundus was normal. The gastric body, antrum, and pylorus appeared  normal.  Duodenoscopy:  The duodenal bulb and descending duodenum appeared normal.  BIOPSY:  A single biopsy was taken from the distal gastric antrum with the cold biopsy forceps and submitted for CLOtest to rule out Helicobacter pylori antral gastritis.  ASSESSMENT:  Normal esophagogastroduodenoscopy.  CLOtest to rule out Helicobacter pylori antral gastritis pending.  #2 - DESCRIPTION OF PROCEDURE - SCREENING PROCTOCOLONOSCOPY TO THE CECUM: Anal inspection was normal.  Digital rectal exam was normal.  The Olympus pediatric video colonoscope was introduced into the rectum and easily advanced to the cecum.  Colonic preparation for the exam today  w as excellent.  Rectum normal.  Sigmoid colon and descending colon normal.  Splenic flexure normal.  Transverse colon normal.  Hepatic flexure normal.  Ascending colon normal.  Cecum and ileocecal valve normal.  ASSESSMENT:  Normal screening proctocolonoscopy to the cecum.  No endoscopic evidence for the presence of colorectal neoplasia. Dictated by:   Verlin Grills, M.D. Attending Physician:  Dennison Bulla Ii DD:  05/25/01 TD:  05/26/01 Job: [id number]

## 2010-06-28 ENCOUNTER — Ambulatory Visit (INDEPENDENT_AMBULATORY_CARE_PROVIDER_SITE_OTHER): Payer: Medicare Other | Admitting: Vascular Surgery

## 2010-06-28 ENCOUNTER — Encounter: Payer: Self-pay | Admitting: Vascular Surgery

## 2010-06-28 ENCOUNTER — Other Ambulatory Visit (INDEPENDENT_AMBULATORY_CARE_PROVIDER_SITE_OTHER): Payer: Medicare Other

## 2010-06-28 VITALS — BP 145/73 | HR 52 | Resp 12

## 2010-06-28 DIAGNOSIS — I701 Atherosclerosis of renal artery: Secondary | ICD-10-CM | POA: Insufficient documentation

## 2010-06-28 DIAGNOSIS — Z48812 Encounter for surgical aftercare following surgery on the circulatory system: Secondary | ICD-10-CM

## 2010-06-28 NOTE — Progress Notes (Signed)
[facility] [place]  Established Renal Artery Stenosis  History of Present Illness  GAVYN ZOSS is a 75 y.o. female who presents with chief complaint: follow up on renal artery stenosis & R RAS.  Previous renal duplex completed on 03/24/10 reveals: R RA stenosis: <60% and L RA stenosis: >60%  The patient's blood pressure has been stable.  The patient's blood pressure medication regimen has remained stable.  The patient's urinary history has remained stable.  Past Medical History, Past Surgical History, Social History, Family History, Medications, Allergies, and Review of Systems are unchanged from previous evaluation on 03/22/10.  Physical Examination  Filed Vitals:   06/28/10 1048  BP: 145/73  Pulse: 52  Resp: 12    General: A&O x 3, WDWN  Pulmonary: Sym exp, good air movt, CTAB, no rales, rhonchi, & wheezing  Cardiac: RRR, Nl S1, S2, no Murmurs, rubs or gallops  Vascular:   R arm: palpable,    L arm: palpable  R carotid: palpable without bruit,  L carotid: palpable without bruit  Abd. Aorta: non-palpable  R femoral: palpable,   L femoral: palpable  R popliteal: palpable,   L popliteal: palpable  R DP: palpable,    L DP: palpable  R PT: palpable,    L PT: palpable  Gastrointestinal: soft, NTND, -G/R, - HSM, - masses, - CVAT B  Musculoskeletal: M/S 5/5 throughout, Extremities without ischemic changes   Neurologic: CN 2-12 intact, Pain and light touch intact in extremities, Motor exam as listed above  Non-Invasive Vascular Imaging  Renal Duplex (Date: 06/28/10)  R kidney size: 10.1 cm  L kidney size: 10.3 cm  R RA stenosis: <60%  L RA stenosis: <60%  R RI: 0.74  L RI: 0.74   Medical Decision Making  JATARA HUETTNER is a 75 y.o. female who presents with: prev R RAS  Based on the patient's vascular studies and examination, her renal artery stenosis is stable with a widely patent R renal artery stent  I discussed in depth with  the patient the nature of atherosclerosis, and emphasized the importance of maximal medical management including strict control of blood pressure, blood glucose, and lipid levels, obtaining regular exercise, and cessation of smoking.  The patient is aware that without maximal medical management the underlying atherosclerotic disease process will progress, limiting the benefit of any interventions.  We will repeat the study in 6 months as precaution as there was an elevation in velocities in the L RA on the last duplex  If subsequent renal duplexes are stable, we will extend the surveillance interval to 1 year  Thank you for allowing [place] to participate in this patient's care.  Leonides Sake, MD [facility] [place] Office: [phone] Pager: [phone]

## 2010-06-28 NOTE — Progress Notes (Signed)
Seen for 3 month f/u and vasc labs

## 2010-07-08 NOTE — Procedures (Unsigned)
RENAL ARTERY DUPLEX EVALUATION  INDICATION:  Right renal artery stenosis.  HISTORY: Diabetes:  No. Cardiac:  No. Hypertension:  Yes. Smoking:  Yes.  RENAL ARTERY DUPLEX FINDINGS: Aorta-Proximal:  63 cm/s Aorta-Mid:  97 cm/s Aorta-Distal:  92 cm/s Celiac Artery Origin:  154 cm/s SMA Origin:  158 cm/s                                   RIGHT               LEFT Renal Artery Origin:             170 cm/s            80 cm/s Renal Artery Proximal:           242 cm/s            258 cm/s Renal Artery Mid:                201 cm/s            81 cm/s Renal Artery Distal:             151 cm/s            66 cm/s Hilar Acceleration Time (AT): Renal-Aortic Ratio (RAR):        2.5                 2.7 Kidney Size:                     10.1 cm             10.3 cm End Diastolic Ratio (EDR):       0.74                0.74 Resistive Index (RI):  IMPRESSION: 1. Patent right renal artery stenosis with no hemodynamically     significant stenosis of the bilateral renal arteries, based on     renal aortic ratios, however, velocities of >200 cm/s noted in the     bilateral proximal renal arteries, as described above. 2. The bilateral resistive indices and kidney length measurements     appear within normal limits. 3. No evidence of celiac or superior mesenteric artery stenosis noted. 4. Doppler velocities of the left renal artery appear less than     previously recorded when compared to the previous exam on     03/22/2010 with the right renal artery velocities appearing stable.       ___________________________________________ Fransisco Hertz, MD  CH/MEDQ  D:  06/28/2010  T:  06/28/2010  Job:  [id number]

## 2010-08-27 ENCOUNTER — Other Ambulatory Visit: Payer: Self-pay | Admitting: Neurological Surgery

## 2010-08-27 DIAGNOSIS — M545 Low back pain, unspecified: Secondary | ICD-10-CM

## 2010-09-05 ENCOUNTER — Ambulatory Visit
Admission: RE | Admit: 2010-09-05 | Discharge: 2010-09-05 | Disposition: A | Discharge status: Home / Self Care | Payer: Medicare Other | Source: Ambulatory Visit | Attending: Neurological Surgery | Admitting: Neurological Surgery

## 2010-09-05 DIAGNOSIS — M545 Low back pain, unspecified: Secondary | ICD-10-CM

## 2010-10-17 LAB — COMPREHENSIVE METABOLIC PANEL
ALT: 29
AST: 40 — ABNORMAL HIGH
Alkaline Phosphatase: 46
CO2: 26
Calcium: 11.2 — ABNORMAL HIGH
GFR calc Af Amer: >60
Potassium: 4.5
Sodium: 137
Total Protein: 8

## 2010-10-17 LAB — CBC
MCHC: 34.6
RBC: 4.46
RDW: 13.8

## 2010-10-17 LAB — APTT: aPTT: 22 — ABNORMAL LOW

## 2010-12-26 ENCOUNTER — Encounter: Payer: Self-pay | Admitting: Vascular Surgery

## 2010-12-27 ENCOUNTER — Encounter: Payer: Self-pay | Admitting: Vascular Surgery

## 2010-12-27 ENCOUNTER — Ambulatory Visit (INDEPENDENT_AMBULATORY_CARE_PROVIDER_SITE_OTHER): Payer: Medicare Other | Admitting: Vascular Surgery

## 2010-12-27 ENCOUNTER — Ambulatory Visit (INDEPENDENT_AMBULATORY_CARE_PROVIDER_SITE_OTHER): Payer: Medicare Other | Admitting: *Deleted

## 2010-12-27 VITALS — BP 143/81 | HR 63 | Resp 16 | Ht 61.0 in | Wt 111.0 lb

## 2010-12-27 DIAGNOSIS — Z48812 Encounter for surgical aftercare following surgery on the circulatory system: Secondary | ICD-10-CM

## 2010-12-27 DIAGNOSIS — I701 Atherosclerosis of renal artery: Secondary | ICD-10-CM

## 2010-12-27 DIAGNOSIS — N186 End stage renal disease: Secondary | ICD-10-CM | POA: Insufficient documentation

## 2010-12-27 NOTE — Progress Notes (Signed)
[facility] [place]  Established Renal Artery Stenosis  History of Present Illness  Donna [name] is a 75 y.o. female who presents with chief complaint: follow up on renal artery stenosis & R RAS. Previous renal duplex completed on 06/28/10 reveals: R RA stenosis: <60% and L RA stenosis: >60%  The patient's blood pressure has been stable. The patient's blood pressure medication regimen has remained stable. The patient's urinary history has remained stable.   Past Medical History, Past Surgical History, Social History, Family History, Medications, Allergies, and Review of Systems are unchanged from previous evaluation on 06/28/10.   Physical Examination  Filed Vitals:   12/27/10 1116  BP: 143/81  Pulse: 63  Resp: 16  Height: 5\' 1"  (1.549 m)  Weight: 111 lb (50.349 kg)  SpO2: 100%   General: A&O x 3, WDWN   Pulmonary: Sym exp, good air movt, CTAB, no rales, rhonchi, & wheezing   Cardiac: RRR, Nl S1, S2, no Murmurs, rubs or gallops   Vascular: Vessel Right Left  Radial Palpable Palpable  Ulnar Palpable Palpable  Brachial Palpable Palpable  Carotid Palpable, without bruit Palpable, without bruit  Aorta Not palpable N/A  Femoral Palpable Palpable  Popliteal Not palpable Not palpable  PT Palpable Palpable  DP Palpable Palpable   Gastrointestinal: soft, NTND, -G/R, - HSM, - masses, - CVAT B   Musculoskeletal: M/S 5/5 throughout, Extremities without ischemic changes   Neurologic: CN 2-12 intact, Pain and light touch intact in extremities, Motor exam as listed above   Non-Invasive Vascular Imaging  Renal Duplex (Date: 12/27/10)  R kidney size: 10.0 cm  L kidney size: 10.0 cm  R RA stenosis: <60%  L RA stenosis: >60%, PSV 409 c/s  Medical Decision Making  Donna [name] is a 75 y.o. female who presents with: prev R RAS  Based on the patient's vascular studies and examination, her renal artery stenosis is stable with a widely patent R renal artery stent    The left kidney size is unchanged and her BP remains stable, so I don't see an immediate reasonable to intervene on the L RA.   The data for RA stenting at this point is quite controversial so without a good clinical indication, I would not intervene rather I would continue surveillance with q6 month renal duplexes. I discussed in depth with the patient the nature of atherosclerosis, and emphasized the importance of maximal medical management including strict control of blood pressure, blood glucose, and lipid levels, obtaining regular exercise, and cessation of smoking. The patient is aware that without maximal medical management the underlying atherosclerotic disease process will progress, limiting the benefit of any interventions.  Thank you for allowing [place] to participate in this patient's care.  Leonides Sake, MD  [facility] [place]  Office: [phone]  Pager: [phone]

## 2011-01-06 NOTE — Procedures (Unsigned)
RENAL ARTERY DUPLEX EVALUATION  INDICATION:  Follow up right renal artery stent, 09/23/2006.  HISTORY: Diabetes:  No. Cardiac: Hypertension:  Yes. Smoking:  No.  RENAL ARTERY DUPLEX FINDINGS: Aorta-Proximal:  99 cm/s Aorta-Mid:  141 cm/s Aorta-Distal:  77 cm/s Celiac Artery Origin:  230 cm/s SMA Origin:  232 cm/s                                   RIGHT               LEFT Renal Artery Origin:             212 cm/s (stent)    100 cm/s Renal Artery Proximal:           198 cm/s            409 cm/s Renal Artery Mid:                218 cm/s            224 cm/s Renal Artery Distal:             183 cm/s            70 cm/s Hilar Acceleration Time (AT): Renal-Aortic Ratio (RAR): Kidney Size:                     10.0 cm             10.0 cm End Diastolic Ratio (EDR): Resistive Index (RI):  IMPRESSION: 1. Patent right renal artery stent with velocities visualized above.     No hemodynamically significant stenosis. 2. Greater than 60% stenosis at the area of tortuosity.  Increased     velocity noted since prior examination of 03/22/10.  ___________________________________________ Fransisco Hertz, MD  SS/MEDQ  D:  12/27/2010  T:  12/27/2010  Job:  [id number]

## 2011-01-16 DIAGNOSIS — M259 Joint disorder, unspecified: Secondary | ICD-10-CM | POA: Diagnosis not present

## 2011-02-11 ENCOUNTER — Other Ambulatory Visit: Payer: Self-pay | Admitting: Specialist

## 2011-02-11 DIAGNOSIS — D485 Neoplasm of uncertain behavior of skin: Secondary | ICD-10-CM | POA: Diagnosis not present

## 2011-02-11 DIAGNOSIS — M6740 Ganglion, unspecified site: Secondary | ICD-10-CM | POA: Diagnosis not present

## 2011-02-13 DIAGNOSIS — B009 Herpesviral infection, unspecified: Secondary | ICD-10-CM | POA: Diagnosis not present

## 2011-03-06 DIAGNOSIS — M79609 Pain in unspecified limb: Secondary | ICD-10-CM | POA: Diagnosis not present

## 2011-03-06 DIAGNOSIS — L608 Other nail disorders: Secondary | ICD-10-CM | POA: Diagnosis not present

## 2011-03-12 DIAGNOSIS — I10 Essential (primary) hypertension: Secondary | ICD-10-CM | POA: Diagnosis not present

## 2011-03-12 DIAGNOSIS — I701 Atherosclerosis of renal artery: Secondary | ICD-10-CM | POA: Diagnosis not present

## 2011-05-26 ENCOUNTER — Encounter: Payer: Self-pay | Admitting: Obstetrics and Gynecology

## 2011-05-28 ENCOUNTER — Encounter: Payer: Self-pay | Admitting: Obstetrics and Gynecology

## 2011-05-28 ENCOUNTER — Ambulatory Visit (INDEPENDENT_AMBULATORY_CARE_PROVIDER_SITE_OTHER): Payer: Medicare Other | Admitting: Obstetrics and Gynecology

## 2011-05-28 VITALS — BP 110/68 | Ht 61.5 in | Wt 115.0 lb

## 2011-05-28 DIAGNOSIS — Z780 Asymptomatic menopausal state: Secondary | ICD-10-CM

## 2011-05-28 DIAGNOSIS — Z124 Encounter for screening for malignant neoplasm of cervix: Secondary | ICD-10-CM | POA: Diagnosis not present

## 2011-05-28 DIAGNOSIS — N951 Menopausal and female climacteric states: Secondary | ICD-10-CM

## 2011-05-28 DIAGNOSIS — I10 Essential (primary) hypertension: Secondary | ICD-10-CM | POA: Diagnosis not present

## 2011-05-28 NOTE — Progress Notes (Signed)
Last Pap: 04/30/2009 WNL: Yes Regular Periods:no Contraception: none  Monthly Breast exam:yes Tetanus<38yrs:no Nl.Bladder Function:yes Daily BMs:yes Healthy Diet:yes Calcium:yes Mammogram:yes 08/19/2010 Exercise:yes water aerobics Seatbelt: yes Abuse at home: no Stressful work:no Sigmoid-colonoscopy: 2009-wnl per pt Dr.Johnson Bone Density: Yes 08/15/2009-wnl @[facility]  BMI-21 Entered by cw,cma  Subjective:    Donna [name] is a 76 y.o. female [phone] who presents for annual exam.  The patient has no complaints today. She has chronic hbp.  She had a transient episode of vaginal odor, but that resolved spontaneously  The following portions of the patient's history were reviewed and updated as appropriate: allergies, current medications, past family history, past medical history, past social history, past surgical history and problem list.  Review of Systems Pertinent items are noted in HPI. Gastrointestinal:No change in bowel habits, no abdominal pain, no rectal bleeding Genitourinary:negative for dysuria, frequency, hematuria, nocturia and urinary incontinence    Objective:     BP 110/68  Ht 5' 1.5" (1.562 m)  Wt 115 lb (52.164 kg)  BMI 21.38 kg/m2  Weight:  Wt Readings from Last 1 Encounters:  05/28/11 115 lb (52.164 kg)     BMI: Body mass index is 21.38 kg/(m^2). General Appearance: Alert, appropriate appearance for age. No acute distress HEENT: Grossly normal Neck / Thyroid: Supple, no masses, nodes or enlargement Lungs: clear to auscultation bilaterally Back: No CVA tenderness Breast Exam: No masses or nodes.No dimpling, nipple retraction or discharge.s/p reduction mammoplasty Cardiovascular: Regular rate and rhythm. S1, S2, no murmur Gastrointestinal: Soft, non-tender, no masses or organomegaly Pelvic Exam: External genitalia: normal general appearance Vaginal: atrophic mucosa Cervix: normal appearance Adnexa: non palpable Uterus: normal single, nontender vagina  well lubricated Rectovaginal: normal rectal, no masses Lymphatic Exam: Non-palpable nodes in neck, clavicular, axillary, or inguinal regions Skin: no rash or abnormalities Neurologic: Normal gait and speech, no tremor  Psychiatric: Alert and oriented, appropriate affect.    Urinalysis:Not done      Assessment:    Normal gyn exam Menopause No sx   Plan:    No further screening paps after age 45  Continue current regimen of diet and exercise Call for recurrence of vaginal odor for evaluation Follow-up:  for annual exam

## 2011-07-03 ENCOUNTER — Encounter: Payer: Self-pay | Admitting: Neurosurgery

## 2011-07-04 ENCOUNTER — Ambulatory Visit (INDEPENDENT_AMBULATORY_CARE_PROVIDER_SITE_OTHER): Payer: Medicare Other | Admitting: Neurosurgery

## 2011-07-04 ENCOUNTER — Ambulatory Visit (INDEPENDENT_AMBULATORY_CARE_PROVIDER_SITE_OTHER): Payer: Medicare Other | Admitting: Vascular Surgery

## 2011-07-04 ENCOUNTER — Encounter: Payer: Self-pay | Admitting: Neurosurgery

## 2011-07-04 VITALS — BP 153/74 | HR 55 | Resp 14 | Ht 61.0 in | Wt 111.1 lb

## 2011-07-04 DIAGNOSIS — I701 Atherosclerosis of renal artery: Secondary | ICD-10-CM | POA: Insufficient documentation

## 2011-07-04 DIAGNOSIS — N186 End stage renal disease: Secondary | ICD-10-CM

## 2011-07-04 DIAGNOSIS — Z48812 Encounter for surgical aftercare following surgery on the circulatory system: Secondary | ICD-10-CM

## 2011-07-04 DIAGNOSIS — I10 Essential (primary) hypertension: Secondary | ICD-10-CM

## 2011-07-04 NOTE — Progress Notes (Signed)
Renal artery Duplex performed @ [facility] 07/04/2011

## 2011-07-04 NOTE — Progress Notes (Signed)
[facility] [place] [facility]/PAD/PVD Office Note  CC: Six-month renal artery followup Referring Physician: Imogene Burn  History of Present Illness: 76 year old female patient of Dr. Imogene Burn status post right renal artery stent in September 2008. That appears to be unchanged and patent per the duplex today. The patient denies any abdominal or back pain. The patient also denies any new medical diagnoses or recent surgeries.  Past Medical History  Diagnosis Date  . Hypertension   . Atherosclerosis of renal artery   . Fibrocystic breast     ROS: [x]  Positive   [ ]  Denies    General: [ ]  Weight loss, [ ]  Fever, [ ]  chills Neurologic: [ ]  Dizziness, [ ]  Blackouts, [ ]  Seizure [ ]  Stroke, [ ]  "Mini stroke", [ ]  Slurred speech, [ ]  Temporary blindness; [ ]  weakness in arms or legs, [ ]  Hoarseness Cardiac: [ ]  Chest pain/pressure, [ ]  Shortness of breath at rest [ ]  Shortness of breath with exertion, [ ]  Atrial fibrillation or irregular heartbeat Vascular: [ ]  Pain in legs with walking, [ ]  Pain in legs at rest, [ ]  Pain in legs at night,  [ ]  Non-healing ulcer, [ ]  Blood clot in vein/DVT,   Pulmonary: [ ]  Home oxygen, [ ]  Productive cough, [ ]  Coughing up blood, [ ]  Asthma,  [ ]  Wheezing Musculoskeletal:  [ ]  Arthritis, [ ]  Low back pain, [ ]  Joint pain Hematologic: [ ]  Easy Bruising, [ ]  Anemia; [ ]  Hepatitis Gastrointestinal: [ ]  Blood in stool, [ ]  Gastroesophageal Reflux/heartburn, [ ]  Trouble swallowing Urinary: [ ]  chronic Kidney disease, [ ]  on HD - [ ]  MWF or [ ]  TTHS, [ ]  Burning with urination, [ ]  Difficulty urinating Skin: [ ]  Rashes, [ ]  Wounds Psychological: [ ]  Anxiety, [ ]  Depression   Social History History  Substance Use Topics  . Smoking status: Never Smoker   . Smokeless tobacco: Never Used  . Alcohol Use: No    Family History Family History  Problem Relation Age of Onset  . Stroke Mother 18  . Aneurysm Father     abdominal aortic aneurysm  . Cancer  Sister 66    breast cancer    Allergies  Allergen Reactions  . Tagamet (Cimetidine) Hives    Current Outpatient Prescriptions  Medication Sig Dispense Refill  . amLODipine (NORVASC) 5 MG tablet       . aspirin 81 MG EC tablet Take 81 mg by mouth daily.        Marland Kitchen b complex vitamins tablet Take 1 tablet by mouth daily.        . Calcium Carbonate-Vitamin D (CALCIUM 600+D) 600-400 MG-UNIT per tablet Take 1 tablet by mouth daily.        . carvedilol (COREG) 12.5 MG tablet       . fish oil-omega-3 fatty acids 1000 MG capsule Take 1,200 mg by mouth daily.        . Garlic 500 MG CAPS Take by mouth.        . Grape Seed Extract 100 MG CAPS Take by mouth.        . irbesartan-hydrochlorothiazide (AVALIDE) 300-25 MG per tablet Take 1 tablet by mouth daily.        Marland Kitchen losartan-hydrochlorothiazide (HYZAAR) 100-12.5 MG per tablet Take 1 tablet by mouth daily.        . metoprolol tartrate (LOPRESSOR) 25 MG tablet Take 25 mg by mouth 2 (two) times daily.        Marland Kitchen  Multiple Vitamin (MULTIVITAMIN PO) Take by mouth.        Marland Kitchen NITROSTAT 0.4 MG SL tablet       . VIMOVO 500-20 MG TBEC       . amoxicillin (AMOXIL) 500 MG capsule         Physical Examination  Filed Vitals:   07/04/11 1029  BP: 153/74  Pulse: 55  Resp: 14    Body mass index is 20.99 kg/(m^2).  General:  WDWN in NAD Gait: Normal HEENT: WNL Eyes: Pupils equal Pulmonary: normal non-labored breathing , without Rales, rhonchi,  wheezing Cardiac: RRR, without  Murmurs, rubs or gallops; No carotid bruits Abdomen: soft, NT, no masses Skin: no rashes, ulcers noted Vascular Exam/Pulses: Palpable femoral and lower extremity pulses bilaterally  Extremities without ischemic changes, no Gangrene , no cellulitis; no open wounds;  Musculoskeletal: no muscle wasting or atrophy  Neurologic: A&O X 3; Appropriate Affect ; SENSATION: normal; MOTOR FUNCTION:  moving all extremities equally. Speech is fluent/normal  Non-Invasive Vascular Imaging:  Renal artery duplex shows a patent right renal artery stent that is poorly visualized due to acoustic shadowing and small vessel size. Stenosis is less than 60%. R AR is 2.6, left renal artery stenosis greater than 60% with  RAR of 3.6 essentially unchanged from previous exam December 2012  ASSESSMENT/PLAN: I spoke with Dr. Imogene Burn regarding the results of the duplex, the patient can followup in 6 months with repeat renal artery duplex. Her questions were encouraged and answered, she is in agreement with this plan.  Lauree Chandler ANP  Clinic M.D.: Imogene Burn

## 2011-07-08 NOTE — Procedures (Unsigned)
RENAL ARTERY DUPLEX EVALUATION  INDICATION:  Renal artery stenosis, end-stage renal disease, right renal artery stent placed 09/23/2006.  HISTORY: Diabetes:  No Cardiac:  No Hypertension:  Yes Smoking:  No  RENAL ARTERY DUPLEX FINDINGS: Aorta-Proximal:  81 cm/s Aorta-Mid:  99 cm/s Aorta-Distal:  96 cm/s Celiac Artery Origin:  207 cm/s SMA Origin:  217 cm/s                                   RIGHT               LEFT Renal Artery Origin:             135 cm/s            121 cm/s Renal Artery Proximal:           213 cm/s            267 cm/s Renal Artery Mid:                150 cm/s            290 cm/s Renal Artery Distal:             195 cm/s            88 cm/s Hilar Acceleration Time (AT): Renal-Aortic Ratio (RAR):        2.6                 3.6 Kidney Size:                     10 cm               9.7 cm End Diastolic Ratio (EDR): Resistive Index (RI):            0.69                0.81  IMPRESSION: 1. Patent right renal artery stent, poorly visualized due to acoustic     shadowing and small vessel size. 2. Right renal artery stenosis of less than 60% present, with a renal     aortic ratio of 2.6. 3. Left renal artery stenosis present of greater than 60%, with renal     aortic ratio of 3.6. 4. Essentially unchanged since previous study on 12/26/2010.  ___________________________________________ Fransisco Hertz, MD  SH/MEDQ  D:  07/04/2011  T:  07/04/2011  Job:  [id number]

## 2011-07-10 DIAGNOSIS — M25559 Pain in unspecified hip: Secondary | ICD-10-CM | POA: Diagnosis not present

## 2011-07-10 DIAGNOSIS — R52 Pain, unspecified: Secondary | ICD-10-CM | POA: Diagnosis not present

## 2011-07-11 ENCOUNTER — Emergency Department (HOSPITAL_COMMUNITY): Payer: Medicare Other

## 2011-07-11 ENCOUNTER — Encounter (HOSPITAL_COMMUNITY): Payer: Self-pay | Admitting: Emergency Medicine

## 2011-07-11 ENCOUNTER — Emergency Department (HOSPITAL_COMMUNITY)
Admission: EM | Admit: 2011-07-11 | Discharge: 2011-07-11 | Disposition: A | Discharge status: Home / Self Care | Payer: Medicare Other | Attending: Emergency Medicine | Admitting: Emergency Medicine

## 2011-07-11 DIAGNOSIS — M169 Osteoarthritis of hip, unspecified: Secondary | ICD-10-CM | POA: Diagnosis not present

## 2011-07-11 DIAGNOSIS — M25859 Other specified joint disorders, unspecified hip: Secondary | ICD-10-CM | POA: Diagnosis not present

## 2011-07-11 DIAGNOSIS — M545 Low back pain, unspecified: Secondary | ICD-10-CM | POA: Diagnosis not present

## 2011-07-11 DIAGNOSIS — I10 Essential (primary) hypertension: Secondary | ICD-10-CM | POA: Insufficient documentation

## 2011-07-11 DIAGNOSIS — M949 Disorder of cartilage, unspecified: Secondary | ICD-10-CM | POA: Insufficient documentation

## 2011-07-11 DIAGNOSIS — M25559 Pain in unspecified hip: Secondary | ICD-10-CM | POA: Diagnosis not present

## 2011-07-11 DIAGNOSIS — M47817 Spondylosis without myelopathy or radiculopathy, lumbosacral region: Secondary | ICD-10-CM | POA: Insufficient documentation

## 2011-07-11 DIAGNOSIS — M1610 Unilateral primary osteoarthritis, unspecified hip: Secondary | ICD-10-CM | POA: Insufficient documentation

## 2011-07-11 DIAGNOSIS — M899 Disorder of bone, unspecified: Secondary | ICD-10-CM | POA: Diagnosis not present

## 2011-07-11 DIAGNOSIS — M549 Dorsalgia, unspecified: Secondary | ICD-10-CM

## 2011-07-11 MED ORDER — HYDROCODONE-ACETAMINOPHEN 5-325 MG PO TABS: 1.0000 | ORAL_TABLET | ORAL | Status: AC | PRN

## 2011-07-11 MED ORDER — HYDROCODONE-ACETAMINOPHEN 5-325 MG PO TABS
1.0000 | ORAL_TABLET | Freq: Once | ORAL | Status: AC
  Administered 2011-07-11: 1 via ORAL
  Filled 2011-07-11: qty 1

## 2011-07-11 MED ORDER — HYDROCODONE-ACETAMINOPHEN 5-325 MG PO TABS: 1.0000 | ORAL_TABLET | ORAL | Status: DC | PRN

## 2011-07-11 NOTE — ED Provider Notes (Signed)
History     CSN: [id number]  Arrival date & time 07/11/11  0006   First MD Initiated Contact with Patient 07/11/11 0022      Chief Complaint  Patient presents with  . Hip Pain    (Consider location/radiation/quality/duration/timing/severity/associated sxs/prior treatment) HPI Comments: LINDA GRIMMER is a 76 y.o. Female who is here for evaluation of a tender nodule in her right buttock. She has pain only with movement. She's never noticed a nodule before. She took an unknown arthritis pill today without relief. She denies known trauma. No change in her bowel or urinary habits. She's been using her regular medication, without relief.  Patient is a 76 y.o. female presenting with hip pain. The history is provided by the patient.  Hip Pain    Past Medical History  Diagnosis Date  . Hypertension   . Atherosclerosis of renal artery   . Fibrocystic breast     Past Surgical History  Procedure Date  . Renal artery stent 2008    Right renal artery by Dr. Liliane Bade  . Hemorroidectomy   . Tubal ligation 1970's  . Tonsillectomy     Family History  Problem Relation Age of Onset  . Stroke Mother 62  . Aneurysm Father     abdominal aortic aneurysm  . Cancer Sister 69    breast cancer    History  Substance Use Topics  . Smoking status: Never Smoker   . Smokeless tobacco: Never Used  . Alcohol Use: No    OB History    Grav Para Term Preterm Abortions TAB SAB Ect Mult Living   4 4 4  0 0 0 0 0 0 4      Review of Systems  All other systems reviewed and are negative.    Allergies  Tagamet  Home Medications   Current Outpatient Rx  Name Route Sig Dispense Refill  . AMLODIPINE BESYLATE 5 MG PO TABS Oral Take 5 mg by mouth daily.     . ASPIRIN 81 MG PO TBEC Oral Take 81 mg by mouth daily.      . B COMPLEX PO TABS Oral Take 1 tablet by mouth daily.      Marland Kitchen CALCIUM CARBONATE-VITAMIN D 600-400 MG-UNIT PO TABS Oral Take 1 tablet by mouth daily.      . OMEGA-3 FATTY ACIDS  1000 MG PO CAPS Oral Take 1,200 mg by mouth daily.      Marland Kitchen GARLIC 500 MG PO CAPS Oral Take 500 mg by mouth daily.     Marland Kitchen GRAPE SEED EXTRACT 100 MG PO CAPS Oral Take 100 mg by mouth daily.     . IRBESARTAN-HYDROCHLOROTHIAZIDE 300-25 MG PO TABS Oral Take 1 tablet by mouth daily.      Marland Kitchen LOSARTAN POTASSIUM-HCTZ 100-12.5 MG PO TABS Oral Take 1 tablet by mouth daily.      Marland Kitchen METOPROLOL TARTRATE 25 MG PO TABS Oral Take 25 mg by mouth 2 (two) times daily.      . MULTIVITAMIN PO Oral Take 1 tablet by mouth daily.     Marland Kitchen NITROSTAT 0.4 MG SL SUBL Sublingual Place 0.4 mg under the tongue every 5 (five) minutes as needed. For chest pain    . HYDROCODONE-ACETAMINOPHEN 5-325 MG PO TABS Oral Take 1 tablet by mouth every 4 (four) hours as needed for pain. 20 tablet 0    BP 144/65  Pulse 68  Temp 98.8 F (37.1 C) (Oral)  Resp 20  SpO2 100%  Physical Exam  Nursing note and vitals reviewed. Constitutional: She is oriented to person, place, and time. She appears well-developed and well-nourished.  HENT:  Head: Normocephalic and atraumatic.  Eyes: Conjunctivae and EOM are normal. Pupils are equal, round, and reactive to light.  Neck: Normal range of motion and phonation normal. Neck supple.  Cardiovascular: Normal rate.   Pulmonary/Chest: Effort normal. She exhibits no tenderness.  Musculoskeletal: Normal range of motion.       Mobile nodule right upper buttocks at SI region, NTTP. There is tenderness of the right SI joint. There is also pain in this region when she moves about on the stretcher.   Neurological: She is alert and oriented to person, place, and time. She has normal strength. She exhibits normal muscle tone.  Skin: Skin is warm and dry.  Psychiatric: She has a normal mood and affect. Her behavior is normal. Judgment and thought content normal.    ED Course  Procedures (including critical care time)  Labs Reviewed - No data to display Dg Pelvis 1-2 Views  07/11/2011  *RADIOLOGY REPORT*   Clinical Data: Right hip pain  PELVIS - 1-2 VIEW  Comparison: None.  Findings: Degraded by rotation.  Osteopenia.  Mild bilateral hip and pubic symphysis DJD. Advanced degenerative changes of the lower lumbar spine with associated leftward curvature.  There are multiple soft tissue calcific densities projecting superior to both hips. Atherosclerotic vascular calcifications  IMPRESSION: Within limitations of a single view, no acute fracture or dislocation identified.  Recommend dedicated hip radiographs if concern for acute fracture persists.  Multiple nonspecific calcific densities project superior to the hip joints (though may be within the posterior soft tissues). May reflect injection granuloma.  Original Report Authenticated By: Waneta Martins, M.D.     1. Back pain       MDM  Musculoskeletal pain, consistent with degenerative joint disease from a local pelvic process or possibly from a lumbar source.Doubt metabolic instability, serious bacterial infection or impending vascular collapse; the patient is stable for discharge.   Plan: Home Medications- Norco; Home Treatments- heat therapy; Recommended follow up- PCP or Neurosurg. If not better in 1 week        Flint Melter, MD 07/11/11 [phone]

## 2011-07-11 NOTE — ED Notes (Signed)
Pt presented to ED, brought by EMS with the complaint of hip pain which started this evening.She has difficulty in walking and complains that it is a radiating pain .

## 2011-07-11 NOTE — ED Notes (Signed)
Pt for discharge.Vital signs stable and GCS 15 

## 2011-07-11 NOTE — ED Notes (Signed)
Pt complain of hip pain radiating

## 2011-07-16 DIAGNOSIS — M47817 Spondylosis without myelopathy or radiculopathy, lumbosacral region: Secondary | ICD-10-CM | POA: Diagnosis not present

## 2011-08-13 ENCOUNTER — Telehealth: Payer: Self-pay | Admitting: Obstetrics and Gynecology

## 2011-08-13 NOTE — Telephone Encounter (Signed)
Donna [name]/[facility] PT °

## 2011-08-14 NOTE — Telephone Encounter (Signed)
Tc to pt per telephone call. Order for DEXA faxed to [facility] [phone]. Pt agrees.

## 2011-08-20 DIAGNOSIS — Z803 Family history of malignant neoplasm of breast: Secondary | ICD-10-CM | POA: Diagnosis not present

## 2011-08-20 DIAGNOSIS — Z1231 Encounter for screening mammogram for malignant neoplasm of breast: Secondary | ICD-10-CM | POA: Diagnosis not present

## 2011-08-20 DIAGNOSIS — Z13820 Encounter for screening for osteoporosis: Secondary | ICD-10-CM | POA: Diagnosis not present

## 2011-09-29 DIAGNOSIS — Z23 Encounter for immunization: Secondary | ICD-10-CM | POA: Diagnosis not present

## 2011-10-06 DIAGNOSIS — I739 Peripheral vascular disease, unspecified: Secondary | ICD-10-CM | POA: Diagnosis not present

## 2011-10-06 DIAGNOSIS — E785 Hyperlipidemia, unspecified: Secondary | ICD-10-CM | POA: Diagnosis not present

## 2011-10-06 DIAGNOSIS — K222 Esophageal obstruction: Secondary | ICD-10-CM | POA: Diagnosis not present

## 2011-10-06 DIAGNOSIS — I10 Essential (primary) hypertension: Secondary | ICD-10-CM | POA: Diagnosis not present

## 2011-10-09 ENCOUNTER — Ambulatory Visit (HOSPITAL_COMMUNITY)
Admission: RE | Admit: 2011-10-09 | Discharge: 2011-10-09 | Disposition: A | Discharge status: Home / Self Care | Payer: Medicare Other | Source: Ambulatory Visit | Attending: Cardiology | Admitting: Cardiology

## 2011-10-09 DIAGNOSIS — R0989 Other specified symptoms and signs involving the circulatory and respiratory systems: Secondary | ICD-10-CM | POA: Insufficient documentation

## 2011-10-09 DIAGNOSIS — I739 Peripheral vascular disease, unspecified: Secondary | ICD-10-CM

## 2011-10-09 NOTE — Progress Notes (Signed)
VASCULAR LAB PRELIMINARY  PRELIMINARY  PRELIMINARY  PRELIMINARY  Carotid duplex  completed.    Preliminary report:  Bilateral:  No evidence of hemodynamically significant internal carotid artery stenosis.   Vertebral artery flow is antegrade.      Donna [name], RVT 10/09/2011, 2:44 PM

## 2011-12-19 ENCOUNTER — Ambulatory Visit: Payer: Medicare Other | Admitting: Neurosurgery

## 2011-12-19 ENCOUNTER — Other Ambulatory Visit: Payer: Medicare Other

## 2012-01-09 ENCOUNTER — Other Ambulatory Visit: Payer: Self-pay

## 2012-01-09 ENCOUNTER — Ambulatory Visit: Payer: Self-pay | Admitting: Neurosurgery

## 2012-01-22 ENCOUNTER — Encounter: Payer: Self-pay | Admitting: Vascular Surgery

## 2012-01-23 ENCOUNTER — Other Ambulatory Visit (INDEPENDENT_AMBULATORY_CARE_PROVIDER_SITE_OTHER): Payer: Medicare Other | Admitting: *Deleted

## 2012-01-23 ENCOUNTER — Ambulatory Visit (INDEPENDENT_AMBULATORY_CARE_PROVIDER_SITE_OTHER): Payer: Medicare Other | Admitting: Vascular Surgery

## 2012-01-23 ENCOUNTER — Encounter: Payer: Self-pay | Admitting: Vascular Surgery

## 2012-01-23 VITALS — BP 152/88 | HR 61 | Ht 61.0 in | Wt 114.6 lb

## 2012-01-23 DIAGNOSIS — I701 Atherosclerosis of renal artery: Secondary | ICD-10-CM

## 2012-01-23 DIAGNOSIS — Z48812 Encounter for surgical aftercare following surgery on the circulatory system: Secondary | ICD-10-CM

## 2012-01-23 NOTE — Progress Notes (Signed)
[facility] [place]   Established Renal Artery Stenosis   History of Present Illness  Donna [name] is a 77 y.o. female who presents with chief complaint: follow up on possible B renal artery stenosis and h/o R RAS.  Previous renal duplex completed on 07/04/11 reveals: R RA stenosis: <60% and L RA stenosis: >60%  The patient's blood pressure has been stable. The patient's blood pressure medication regimen has remained stable. The patient's urinary history has remained stable.  The patient has made significant lifestyle and dietary changes over the last few years.  Past Medical History, Past Surgical History, Social History, Family History, Medications, Allergies, and Review of Systems are unchanged from previous evaluation on 07/04/11  Physical Examination   Filed Vitals:   01/23/12 1021  BP: 152/88  Pulse: 61  Height: 5\' 1"  (1.549 m)  Weight: 114 lb 9.6 oz (51.982 kg)  SpO2: 100%   General: A&O x 3, WDWN   Pulmonary: Sym exp, good air movt, CTAB, no rales, rhonchi, & wheezing   Cardiac: RRR, Nl S1, S2, no Murmurs, rubs or gallops   Vascular:  Vessel  Right  Left   Radial  Palpable  Palpable   Ulnar  Palpable  Palpable   Brachial  Palpable  Palpable   Carotid  Palpable, without bruit  Palpable, without bruit   Aorta  Not palpable  N/A   Femoral  Palpable  Palpable   Popliteal  Not palpable  Not palpable   PT  Palpable  Palpable   DP  Palpable  Palpable    Gastrointestinal: soft, NTND, -G/R, - HSM, - masses, - CVAT B , no abdominal bruits  Musculoskeletal: M/S 5/5 throughout, Extremities without ischemic changes   Neurologic: Pain and light touch intact in extremities, Motor exam as listed above   Non-Invasive Vascular Imaging  Renal Duplex (Date: 01/23/12)  R kidney size: 9.94 cm  L kidney size: 10.0 cm  R RA stenosis: <60%  L RA stenosis: <60%,  Medical Decision Making  Donna [name] is a 77 y.o. female who presents with: s/p R RA stenting,  h/o L Renal artery stenosis  Based on the patient's vascular studies and examination, her renal artery stenosis is stable with a widely patent R renal artery stent  L RA demonstrates not stenosis today. I recommended annual B renal artery duplex to continue surveillance on both renal arteries. I discussed in depth with the patient the nature of atherosclerosis, and emphasized the importance of maximal medical management including strict control of blood pressure, blood glucose, and lipid levels, obtaining regular exercise, and cessation of smoking. The patient is aware that without maximal medical management the underlying atherosclerotic disease process will progress, limiting the benefit of any interventions.  Thank you for allowing [place] to participate in this patient's care.   Leonides Sake, MD  [facility] [place]  Office: [phone]  Pager: [phone]

## 2012-01-26 NOTE — Addendum Note (Signed)
Addended by: Sharee Pimple on: 01/26/2012 08:13 AM   Modules accepted: Orders

## 2012-02-06 DIAGNOSIS — I10 Essential (primary) hypertension: Secondary | ICD-10-CM | POA: Diagnosis not present

## 2012-02-06 DIAGNOSIS — M25569 Pain in unspecified knee: Secondary | ICD-10-CM | POA: Diagnosis not present

## 2012-03-01 ENCOUNTER — Other Ambulatory Visit (HOSPITAL_COMMUNITY): Payer: Self-pay | Admitting: Cardiology

## 2012-03-01 DIAGNOSIS — R55 Syncope and collapse: Secondary | ICD-10-CM

## 2012-03-01 DIAGNOSIS — K222 Esophageal obstruction: Secondary | ICD-10-CM | POA: Diagnosis not present

## 2012-03-03 ENCOUNTER — Ambulatory Visit (HOSPITAL_COMMUNITY)
Admission: RE | Admit: 2012-03-03 | Discharge: 2012-03-03 | Disposition: A | Discharge status: Home / Self Care | Payer: Medicare Other | Source: Ambulatory Visit | Attending: Cardiology | Admitting: Cardiology

## 2012-03-03 DIAGNOSIS — I708 Atherosclerosis of other arteries: Secondary | ICD-10-CM | POA: Insufficient documentation

## 2012-03-03 DIAGNOSIS — R55 Syncope and collapse: Secondary | ICD-10-CM | POA: Diagnosis not present

## 2012-03-03 DIAGNOSIS — I120 Hypertensive chronic kidney disease with stage 5 chronic kidney disease or end stage renal disease: Secondary | ICD-10-CM | POA: Insufficient documentation

## 2012-03-03 DIAGNOSIS — I10 Essential (primary) hypertension: Secondary | ICD-10-CM | POA: Diagnosis not present

## 2012-03-03 DIAGNOSIS — N186 End stage renal disease: Secondary | ICD-10-CM | POA: Insufficient documentation

## 2012-03-03 LAB — CBC
MCH: 31.5 pg (ref 26.0–34.0)
MCHC: 35.1 g/dL (ref 30.0–36.0)
Platelets: 177 10*3/uL (ref 150–400)
RBC: 4.28 MIL/uL (ref 3.87–5.11)

## 2012-03-03 LAB — COMPREHENSIVE METABOLIC PANEL
ALT: 20 U/L (ref 0–35)
AST: 19 U/L (ref 0–37)
Calcium: 9.9 mg/dL (ref 8.4–10.5)
Potassium: 3.6 mEq/L (ref 3.5–5.1)
Sodium: 139 mEq/L (ref 135–145)
Total Protein: 7.4 g/dL (ref 6.0–8.3)

## 2012-03-03 NOTE — Progress Notes (Signed)
*  PRELIMINARY RESULTS* Vascular Ultrasound Carotid Duplex (Doppler) has been completed.  Preliminary findings: Bilateral:  No evidence of hemodynamically significant internal carotid artery stenosis.   Vertebral artery flow is antegrade.      Farrel Demark, RDMS, RVT  03/03/2012, 9:41 AM

## 2012-03-31 DIAGNOSIS — M25819 Other specified joint disorders, unspecified shoulder: Secondary | ICD-10-CM | POA: Diagnosis not present

## 2012-03-31 DIAGNOSIS — M12519 Traumatic arthropathy, unspecified shoulder: Secondary | ICD-10-CM | POA: Diagnosis not present

## 2012-05-03 DIAGNOSIS — H2510 Age-related nuclear cataract, unspecified eye: Secondary | ICD-10-CM | POA: Diagnosis not present

## 2012-05-05 DIAGNOSIS — Z803 Family history of malignant neoplasm of breast: Secondary | ICD-10-CM | POA: Diagnosis not present

## 2012-05-05 DIAGNOSIS — N644 Mastodynia: Secondary | ICD-10-CM | POA: Diagnosis not present

## 2012-05-06 DIAGNOSIS — N6009 Solitary cyst of unspecified breast: Secondary | ICD-10-CM | POA: Diagnosis not present

## 2012-05-06 DIAGNOSIS — N644 Mastodynia: Secondary | ICD-10-CM | POA: Diagnosis not present

## 2012-05-13 ENCOUNTER — Other Ambulatory Visit: Payer: Self-pay | Admitting: Obstetrics and Gynecology

## 2012-05-13 DIAGNOSIS — N644 Mastodynia: Secondary | ICD-10-CM

## 2012-06-01 DIAGNOSIS — F4323 Adjustment disorder with mixed anxiety and depressed mood: Secondary | ICD-10-CM | POA: Diagnosis not present

## 2012-06-29 ENCOUNTER — Ambulatory Visit
Admission: RE | Admit: 2012-06-29 | Discharge: 2012-06-29 | Disposition: A | Discharge status: Home / Self Care | Payer: Medicare Other | Source: Ambulatory Visit | Attending: Internal Medicine | Admitting: Internal Medicine

## 2012-06-29 ENCOUNTER — Other Ambulatory Visit: Payer: Self-pay | Admitting: Internal Medicine

## 2012-06-29 DIAGNOSIS — R1033 Periumbilical pain: Secondary | ICD-10-CM | POA: Diagnosis not present

## 2012-06-29 DIAGNOSIS — R109 Unspecified abdominal pain: Secondary | ICD-10-CM

## 2012-06-29 DIAGNOSIS — K219 Gastro-esophageal reflux disease without esophagitis: Secondary | ICD-10-CM | POA: Diagnosis not present

## 2012-07-06 DIAGNOSIS — F4323 Adjustment disorder with mixed anxiety and depressed mood: Secondary | ICD-10-CM | POA: Diagnosis not present

## 2012-07-14 ENCOUNTER — Ambulatory Visit (INDEPENDENT_AMBULATORY_CARE_PROVIDER_SITE_OTHER): Payer: Medicare Other | Admitting: Podiatry

## 2012-07-14 DIAGNOSIS — B351 Tinea unguium: Secondary | ICD-10-CM

## 2012-07-14 DIAGNOSIS — M25579 Pain in unspecified ankle and joints of unspecified foot: Secondary | ICD-10-CM | POA: Diagnosis not present

## 2012-07-14 NOTE — Progress Notes (Signed)
Subjective: 77 year old female presents requesting toe nails trimmed.  Objective: Dermatologic: Thick yellow deformed nails x 1-10.  Vascular: Dorsalis pedis pulses and Posterior tibial pulses are palpable faintly. Orthopedic: Rectus foot with decreased plantar fat pad.  Neurologic: All epicritic and tactile sensations grossly intact.   Assessment:  Dystrophic mycotic nails x 10.   Treatment: All mycotic nails, corns, calluses debrided.  Return in 3 months or as needed.

## 2012-09-16 DIAGNOSIS — Z01419 Encounter for gynecological examination (general) (routine) without abnormal findings: Secondary | ICD-10-CM | POA: Diagnosis not present

## 2012-09-16 DIAGNOSIS — Z124 Encounter for screening for malignant neoplasm of cervix: Secondary | ICD-10-CM | POA: Diagnosis not present

## 2012-09-18 DIAGNOSIS — F4323 Adjustment disorder with mixed anxiety and depressed mood: Secondary | ICD-10-CM | POA: Diagnosis not present

## 2012-09-21 DIAGNOSIS — E039 Hypothyroidism, unspecified: Secondary | ICD-10-CM | POA: Diagnosis not present

## 2012-09-21 DIAGNOSIS — E559 Vitamin D deficiency, unspecified: Secondary | ICD-10-CM | POA: Diagnosis not present

## 2012-09-21 DIAGNOSIS — N951 Menopausal and female climacteric states: Secondary | ICD-10-CM | POA: Diagnosis not present

## 2012-09-27 DIAGNOSIS — R7309 Other abnormal glucose: Secondary | ICD-10-CM | POA: Diagnosis not present

## 2012-09-29 DIAGNOSIS — Z23 Encounter for immunization: Secondary | ICD-10-CM | POA: Diagnosis not present

## 2012-10-07 DIAGNOSIS — R7309 Other abnormal glucose: Secondary | ICD-10-CM | POA: Diagnosis not present

## 2012-10-07 DIAGNOSIS — N951 Menopausal and female climacteric states: Secondary | ICD-10-CM | POA: Diagnosis not present

## 2012-10-15 ENCOUNTER — Encounter: Payer: Self-pay | Admitting: Podiatry

## 2012-10-15 ENCOUNTER — Ambulatory Visit (INDEPENDENT_AMBULATORY_CARE_PROVIDER_SITE_OTHER): Payer: Medicare Other | Admitting: Podiatry

## 2012-10-15 VITALS — BP 136/69 | HR 60 | Ht 61.0 in | Wt 112.0 lb

## 2012-10-15 DIAGNOSIS — M25579 Pain in unspecified ankle and joints of unspecified foot: Secondary | ICD-10-CM | POA: Diagnosis not present

## 2012-10-15 DIAGNOSIS — B351 Tinea unguium: Secondary | ICD-10-CM

## 2012-10-15 NOTE — Patient Instructions (Signed)
Seen for hypertrophic nails x 10.  Return as needed.

## 2012-10-15 NOTE — Progress Notes (Signed)
Subjective:  77 year old female presents requesting toe nails trimmed.   Objective: Dermatologic: Thick yellow deformed nails x 1-10.  Vascular: Dorsalis pedis pulses and Posterior tibial pulses are palpable faintly.  Orthopedic: Rectus foot with decreased plantar fat pad.  Neurologic: All epicritic and tactile sensations grossly intact.   Assessment:  Dystrophic mycotic nails x 10.   Treatment: All mycotic nails debrided.  Return in 3 months or as needed.

## 2012-11-08 ENCOUNTER — Ambulatory Visit: Payer: Medicare Other | Admitting: Dietician

## 2012-11-26 ENCOUNTER — Encounter: Payer: Self-pay | Admitting: *Deleted

## 2012-11-26 ENCOUNTER — Encounter: Payer: Medicare Other | Attending: Obstetrics and Gynecology | Admitting: *Deleted

## 2012-11-26 VITALS — Ht 61.0 in | Wt 109.8 lb

## 2012-11-26 DIAGNOSIS — R7309 Other abnormal glucose: Secondary | ICD-10-CM | POA: Diagnosis not present

## 2012-11-26 DIAGNOSIS — Z713 Dietary counseling and surveillance: Secondary | ICD-10-CM | POA: Insufficient documentation

## 2012-11-26 NOTE — Progress Notes (Signed)
Medical Nutrition Therapy:  Appt start time: 0800 end time:  0900.  Assessment:  Patient here today with elevated HgbA1c of 6.6. She has never had a problem with diabetes prior to this, but both her parents and sister were diagnosed with diabetes later in life. She reports that she tries to eat healthy and exercises daily. She does not eat pork due to concerns about hypertension, and eats a lot of vegetables. She tries to stay away from starches and a lot of sugars. She is at a normal weight.   MEDICATIONS: Reviewed   DIETARY INTAKE:   Usual eating pattern includes 3 meals and 0-1 snacks per day.  24-hr recall:  B ( AM): Cheerios with almonds/unsalted peanuts, skim milk, banana, water  Snk ( AM): None  L ( PM): Salad (broccoli, onion, carrots, chicken, walnut vinegrette) Snk ( PM): None D ( PM): Salad with grilled chicken/chicken nugget OR fish, greens Snk ( PM): Yogurt, granola bar, sugar-free ice cream Beverages: Water  Usual physical activity: [employer] 6 days weekly, water aerobics  Estimated energy needs: 1300 calories 163 g carbohydrates 81 g protein 36 g fat  Progress Towards Goal(s):  In progress.   Nutritional Diagnosis:  NB-1.1 Food and nutrition-related knowledge deficit As related to prediabetes.  As evidenced by no prior education.    Intervention:  Nutrition counseling. We discussed basic carb counting, including foods with carbs, label reading, portion size, and meal planning.   Goals:  1. 2-3 carb servings per meal, 1 serving per snack 2. Read food labels for carb content of foods.  3. Eat consistent carbs throughout the day.  4. Continue exercising as currently.   Handouts given during visit include:  Carbohydrate Counting handout  Monitoring/Evaluation:  Dietary intake, exercise, blood glucose, and body weight prn.

## 2012-12-09 DIAGNOSIS — N63 Unspecified lump in unspecified breast: Secondary | ICD-10-CM | POA: Diagnosis not present

## 2012-12-09 DIAGNOSIS — Z09 Encounter for follow-up examination after completed treatment for conditions other than malignant neoplasm: Secondary | ICD-10-CM | POA: Diagnosis not present

## 2012-12-22 DIAGNOSIS — R7309 Other abnormal glucose: Secondary | ICD-10-CM | POA: Diagnosis not present

## 2012-12-22 DIAGNOSIS — R61 Generalized hyperhidrosis: Secondary | ICD-10-CM | POA: Diagnosis not present

## 2012-12-22 DIAGNOSIS — I10 Essential (primary) hypertension: Secondary | ICD-10-CM | POA: Diagnosis not present

## 2012-12-22 DIAGNOSIS — R634 Abnormal weight loss: Secondary | ICD-10-CM | POA: Diagnosis not present

## 2012-12-22 DIAGNOSIS — R7989 Other specified abnormal findings of blood chemistry: Secondary | ICD-10-CM | POA: Diagnosis not present

## 2012-12-27 ENCOUNTER — Encounter: Payer: Self-pay | Admitting: Podiatry

## 2012-12-27 ENCOUNTER — Ambulatory Visit (INDEPENDENT_AMBULATORY_CARE_PROVIDER_SITE_OTHER): Payer: Medicare Other | Admitting: Podiatry

## 2012-12-27 VITALS — BP 160/110 | HR 62 | Ht 61.0 in | Wt 108.0 lb

## 2012-12-27 DIAGNOSIS — R234 Changes in skin texture: Secondary | ICD-10-CM

## 2012-12-27 DIAGNOSIS — M25579 Pain in unspecified ankle and joints of unspecified foot: Secondary | ICD-10-CM

## 2012-12-27 DIAGNOSIS — L988 Other specified disorders of the skin and subcutaneous tissue: Secondary | ICD-10-CM | POA: Diagnosis not present

## 2012-12-27 DIAGNOSIS — L84 Corns and callosities: Secondary | ICD-10-CM | POA: Diagnosis not present

## 2012-12-27 DIAGNOSIS — B351 Tinea unguium: Secondary | ICD-10-CM

## 2012-12-27 DIAGNOSIS — L0889 Other specified local infections of the skin and subcutaneous tissue: Secondary | ICD-10-CM

## 2012-12-27 NOTE — Progress Notes (Signed)
Subjective:  77 year old female presents with painful heel with fissure.  Objective: Dermatologic: Thick fissured heel bilateral, painful. Vascular: Dorsalis pedis pulses and Posterior tibial pulses are palpable faintly.  Orthopedic: Rectus foot with decreased plantar fat pad.  Neurologic: All epicritic and tactile sensations grossly intact.   Assessment:  Fissured heel with callus bilateral painful.   Treatment: All heel lesions debrided.  Return in 1 month or as needed

## 2012-12-27 NOTE — Patient Instructions (Addendum)
Seen for painful nails and calluses. All lesions debrided. Return in 1 month.

## 2013-01-11 DIAGNOSIS — J029 Acute pharyngitis, unspecified: Secondary | ICD-10-CM | POA: Diagnosis not present

## 2013-01-11 DIAGNOSIS — J00 Acute nasopharyngitis [common cold]: Secondary | ICD-10-CM | POA: Diagnosis not present

## 2013-01-14 ENCOUNTER — Ambulatory Visit: Payer: Medicare Other | Admitting: Podiatry

## 2013-01-14 DIAGNOSIS — J00 Acute nasopharyngitis [common cold]: Secondary | ICD-10-CM | POA: Diagnosis not present

## 2013-01-14 DIAGNOSIS — R5381 Other malaise: Secondary | ICD-10-CM | POA: Diagnosis not present

## 2013-01-14 DIAGNOSIS — R5383 Other fatigue: Secondary | ICD-10-CM | POA: Diagnosis not present

## 2013-01-14 DIAGNOSIS — J029 Acute pharyngitis, unspecified: Secondary | ICD-10-CM | POA: Diagnosis not present

## 2013-01-21 DIAGNOSIS — R5383 Other fatigue: Secondary | ICD-10-CM | POA: Diagnosis not present

## 2013-01-21 DIAGNOSIS — I10 Essential (primary) hypertension: Secondary | ICD-10-CM | POA: Diagnosis not present

## 2013-01-21 DIAGNOSIS — J029 Acute pharyngitis, unspecified: Secondary | ICD-10-CM | POA: Diagnosis not present

## 2013-01-21 DIAGNOSIS — R7309 Other abnormal glucose: Secondary | ICD-10-CM | POA: Diagnosis not present

## 2013-01-21 DIAGNOSIS — R61 Generalized hyperhidrosis: Secondary | ICD-10-CM | POA: Diagnosis not present

## 2013-01-21 DIAGNOSIS — R5381 Other malaise: Secondary | ICD-10-CM | POA: Diagnosis not present

## 2013-01-21 DIAGNOSIS — J00 Acute nasopharyngitis [common cold]: Secondary | ICD-10-CM | POA: Diagnosis not present

## 2013-01-21 DIAGNOSIS — R634 Abnormal weight loss: Secondary | ICD-10-CM | POA: Diagnosis not present

## 2013-01-21 DIAGNOSIS — N951 Menopausal and female climacteric states: Secondary | ICD-10-CM | POA: Diagnosis not present

## 2013-01-26 DIAGNOSIS — N62 Hypertrophy of breast: Secondary | ICD-10-CM | POA: Diagnosis not present

## 2013-01-27 ENCOUNTER — Encounter: Payer: Self-pay | Admitting: Family

## 2013-01-28 ENCOUNTER — Inpatient Hospital Stay (HOSPITAL_COMMUNITY): Admission: RE | Admit: 2013-01-28 | Payer: Medicare Other | Source: Ambulatory Visit

## 2013-01-28 ENCOUNTER — Ambulatory Visit: Payer: Medicare Other | Admitting: Vascular Surgery

## 2013-01-28 ENCOUNTER — Ambulatory Visit: Payer: Medicare Other | Admitting: Family

## 2013-01-28 ENCOUNTER — Other Ambulatory Visit: Payer: Medicare Other

## 2013-01-29 ENCOUNTER — Emergency Department (HOSPITAL_COMMUNITY): Payer: Medicare Other

## 2013-01-29 ENCOUNTER — Emergency Department (HOSPITAL_COMMUNITY)
Admission: EM | Admit: 2013-01-29 | Discharge: 2013-01-29 | Disposition: A | Discharge status: Home / Self Care | Payer: Medicare Other | Attending: Emergency Medicine | Admitting: Emergency Medicine

## 2013-01-29 ENCOUNTER — Encounter (HOSPITAL_COMMUNITY): Payer: Self-pay | Admitting: Emergency Medicine

## 2013-01-29 DIAGNOSIS — Y9389 Activity, other specified: Secondary | ICD-10-CM | POA: Insufficient documentation

## 2013-01-29 DIAGNOSIS — I10 Essential (primary) hypertension: Secondary | ICD-10-CM | POA: Insufficient documentation

## 2013-01-29 DIAGNOSIS — S59919A Unspecified injury of unspecified forearm, initial encounter: Principal | ICD-10-CM

## 2013-01-29 DIAGNOSIS — Y929 Unspecified place or not applicable: Secondary | ICD-10-CM | POA: Insufficient documentation

## 2013-01-29 DIAGNOSIS — X503XXA Overexertion from repetitive movements, initial encounter: Secondary | ICD-10-CM | POA: Insufficient documentation

## 2013-01-29 DIAGNOSIS — Z8679 Personal history of other diseases of the circulatory system: Secondary | ICD-10-CM | POA: Diagnosis not present

## 2013-01-29 DIAGNOSIS — Z8742 Personal history of other diseases of the female genital tract: Secondary | ICD-10-CM | POA: Insufficient documentation

## 2013-01-29 DIAGNOSIS — M25539 Pain in unspecified wrist: Secondary | ICD-10-CM | POA: Insufficient documentation

## 2013-01-29 DIAGNOSIS — S6990XA Unspecified injury of unspecified wrist, hand and finger(s), initial encounter: Principal | ICD-10-CM | POA: Insufficient documentation

## 2013-01-29 DIAGNOSIS — Z79899 Other long term (current) drug therapy: Secondary | ICD-10-CM | POA: Insufficient documentation

## 2013-01-29 DIAGNOSIS — Z7982 Long term (current) use of aspirin: Secondary | ICD-10-CM | POA: Insufficient documentation

## 2013-01-29 DIAGNOSIS — S59909A Unspecified injury of unspecified elbow, initial encounter: Secondary | ICD-10-CM | POA: Insufficient documentation

## 2013-01-29 DIAGNOSIS — X500XXA Overexertion from strenuous movement or load, initial encounter: Secondary | ICD-10-CM | POA: Insufficient documentation

## 2013-01-29 DIAGNOSIS — M25531 Pain in right wrist: Secondary | ICD-10-CM

## 2013-01-29 MED ORDER — NAPROXEN 375 MG PO TABS: 375.0000 mg | ORAL_TABLET | Freq: Two times a day (BID) | ORAL | Status: DC

## 2013-01-29 NOTE — Discharge Instructions (Signed)
Read the information below.  Use the prescribed medication as directed.  Please discuss all new medications with your pharmacist.  You may return to the Emergency Department at any time for worsening condition or any new symptoms that concern you.  If you develop uncontrolled pain, weakness or numbness of the extremity, severe discoloration of the skin, or you are unable to move your wrist, return to the ER for a recheck.      Wrist Pain Wrist injuries are frequent in adults and children. A sprain is an injury to the ligaments that hold your bones together. A strain is an injury to muscle or muscle cord-like structures (tendons) from stretching or pulling. Generally, when wrists are moderately tender to touch following a fall or injury, a break in the bone (fracture) may be present. Most wrist sprains or strains are better in 3 to 5 days, but complete healing may take several weeks. HOME CARE INSTRUCTIONS   Put ice on the injured area.  Put ice in a plastic bag.  Place a towel between your skin and the bag.  Leave the ice on for 15-20 minutes, 03-04 times a day, for the first 2 days.  Keep your arm raised above the level of your heart whenever possible to reduce swelling and pain.  Rest the injured area for at least 48 hours or as directed by your caregiver.  If a splint or elastic bandage has been applied, use it for as long as directed by your caregiver or until seen by a caregiver for a follow-up exam.  Only take over-the-counter or prescription medicines for pain, discomfort, or fever as directed by your caregiver.  Keep all follow-up appointments. You may need to follow up with a specialist or have follow-up X-rays. Improvement in pain level is not a guarantee that you did not fracture a bone in your wrist. The only way to determine whether or not you have a broken bone is by X-ray. SEEK IMMEDIATE MEDICAL CARE IF:   Your fingers are swollen, very red, white, or cold and blue.  Your  fingers are numb or tingling.  You have increasing pain.  You have difficulty moving your fingers. MAKE SURE YOU:   Understand these instructions.  Will watch your condition.  Will get help right away if you are not doing well or get worse. Document Released: 10/02/2004 Document Revised: 03/17/2011 Document Reviewed: 02/13/2010 [facility] Patient Information 2014 [place].

## 2013-01-29 NOTE — ED Notes (Signed)
Rt. Hand pain , began this am, denies any injuries.  Was going thru magazines yesterday, might have sprained rt. Hand, no deformity or swelling noted

## 2013-01-29 NOTE — ED Provider Notes (Signed)
CSN: [id number]     Arrival date & time 01/29/13  0920 History   None   This chart was scribed for Clayton Bibles PA-C, a non-physician practitioner working with Leota Jacobsen, MD by Denice Bors, ED Scribe. This patient was seen in room TR05C/TR05C and the patient's care was started at 9:25 AM     Chief Complaint  Patient presents with  . Hand Pain   (Consider location/radiation/quality/duration/timing/severity/associated sxs/prior Treatment) The history is provided by the patient. No language interpreter was used.   HPI Comments: Donna [name] is a 78 y.o. female who presents to the Emergency Department complaining of intermittent moderate right hand pain onset this morning while "picking up some magazines to throw away." Reports pain is exacerbated by touch and movement. Reports pain is alleviated at rest. Denies that this was a heavy pile she was lifting. Denies associated swelling, recent trauma, neck pain, back pain, fever, recent fall, and numbness. Denies PMHx of right wrist pain.  Past Medical History  Diagnosis Date  . Hypertension   . Atherosclerosis of renal artery   . Fibrocystic breast    Past Surgical History  Procedure Laterality Date  . Renal artery stent  2008    Right renal artery by Dr. Drucie Opitz  . Hemorroidectomy    . Tubal ligation  1970's  . Tonsillectomy     Family History  Problem Relation Age of Onset  . Stroke Mother 36  . Aneurysm Father     abdominal aortic aneurysm  . Cancer Sister 54    breast cancer   History  Substance Use Topics  . Smoking status: Never Smoker   . Smokeless tobacco: Never Used  . Alcohol Use: Yes     Comment: occasional   OB History   Grav Para Term Preterm Abortions TAB SAB Ect Mult Living   4 4 4  0 0 0 0 0 0 4     Review of Systems  Constitutional: Negative for fever.  Musculoskeletal: Positive for arthralgias. Negative for back pain and neck pain.  Skin: Negative for color change.  Neurological: Negative  for weakness and numbness.    Allergies  Tagamet  Home Medications   Current Outpatient Rx  Name  Route  Sig  Dispense  Refill  . ALPRAZolam (XANAX) 0.25 MG tablet   Oral   Take 0.25 mg by mouth daily.          Marland Kitchen amLODipine (NORVASC) 5 MG tablet   Oral   Take 5 mg by mouth daily.          Marland Kitchen aspirin 81 MG EC tablet   Oral   Take 81 mg by mouth daily.           Marland Kitchen b complex vitamins tablet   Oral   Take 1 tablet by mouth daily.           . Calcium Carbonate-Vitamin D (CALCIUM 600+D) 600-400 MG-UNIT per tablet   Oral   Take 1 tablet by mouth daily.           . carvedilol (COREG) 12.5 MG tablet   Oral   Take 12.5 mg by mouth daily.          . Grape Seed Extract 100 MG CAPS   Oral   Take 100 mg by mouth daily.          Marland Kitchen losartan-hydrochlorothiazide (HYZAAR) 100-12.5 MG per tablet   Oral   Take 1 tablet by mouth daily.           Marland Kitchen  metoprolol tartrate (LOPRESSOR) 25 MG tablet   Oral   Take 25 mg by mouth daily.          . Multiple Vitamin (MULTIVITAMIN PO)   Oral   Take 1 tablet by mouth daily.          Marland Kitchen NITROSTAT 0.4 MG SL tablet   Sublingual   Place 0.4 mg under the tongue every 5 (five) minutes as needed. For chest pain          There were no vitals taken for this visit. Physical Exam  Nursing note and vitals reviewed. Constitutional: She appears well-developed and well-nourished. No distress.  HENT:  Head: Normocephalic and atraumatic.  Neck: Neck supple.  Cardiovascular: Intact distal pulses.   Pulses:      Radial pulses are 2+ on the right side.  Cap refill < 3 seconds   Pulmonary/Chest: Effort normal.  Musculoskeletal:       Right elbow: Normal.      Right wrist: She exhibits tenderness. She exhibits normal range of motion, no swelling, no effusion, no crepitus, no deformity and no laceration.       Right forearm: Normal.       Right hand: Normal.  TTP over ulnar aspect of right wrist    Neurological: She is alert.   Skin: She is not diaphoretic.    ED Course  Procedures  COORDINATION OF CARE:  Nursing notes reviewed. Vital signs reviewed. Initial pt interview and examination performed.   9:36 AM-Discussed work up plan with pt at bedside, x-ray of right wrist. Pt agrees with plan.   Treatment plan initiated:Medications - No data to display   Initial diagnostic testing ordered.    Labs Review Labs Reviewed - No data to display Imaging Review Dg Wrist Complete Right  01/29/2013   CLINICAL DATA:  Wrist injury.  Medial wrist pain.  EXAM: RIGHT WRIST - COMPLETE 3+ VIEW  COMPARISON:  Hand radiograph 06/28/2008.  FINDINGS: Four views of the right wrist demonstrate no definite acute displaced fracture, subluxation, dislocation, joint or soft tissue abnormality. Tiny ossific fragment adjacent to the tip of the ulnar styloid, similar to remote prior study 06/28/2008, suggesting remote ulnar styloid avulsion fracture.  IMPRESSION: 1. No acute radiographic abnormality of the right wrist.   Electronically Signed   By: Vinnie Langton M.D.   On: 01/29/2013 10:17    EKG Interpretation   None      9:42 AM Dr Zenia Resides made aware of the patient.   MDM   1. Right wrist pain     Pt with intermittent right wrist pain with movement that began while sorting magazines yesterday.  Neurovascularly intact.  Xray negative.  Pt placed in velcro splint, recommended RICE treatment, PCP follow up.  Discussed result, findings, treatment, and follow up  with patient.  Pt given return precautions.  Pt verbalizes understanding and agrees with plan.      I personally performed the services described in this documentation, which was scribed in my presence. The recorded information has been reviewed and is accurate.    Clayton Bibles, PA-C 01/29/13 1037

## 2013-01-31 ENCOUNTER — Ambulatory Visit (INDEPENDENT_AMBULATORY_CARE_PROVIDER_SITE_OTHER): Payer: Medicare Other | Admitting: Podiatry

## 2013-01-31 ENCOUNTER — Ambulatory Visit: Payer: Medicare Other | Admitting: Podiatry

## 2013-01-31 ENCOUNTER — Encounter: Payer: Self-pay | Admitting: Podiatry

## 2013-01-31 VITALS — BP 143/87 | HR 71

## 2013-01-31 DIAGNOSIS — L988 Other specified disorders of the skin and subcutaneous tissue: Secondary | ICD-10-CM

## 2013-01-31 DIAGNOSIS — M25579 Pain in unspecified ankle and joints of unspecified foot: Secondary | ICD-10-CM | POA: Diagnosis not present

## 2013-01-31 DIAGNOSIS — R234 Changes in skin texture: Secondary | ICD-10-CM

## 2013-01-31 NOTE — Patient Instructions (Signed)
Seen for hypertrophic nails and calluses. All nails and calluses debrided. Return in 3 months or as needed.  

## 2013-01-31 NOTE — Progress Notes (Signed)
Subjective:  78 year old female presents for a follow up on painful heel with fissure.   Objective: Dermatologic: Thick fissured heel bilateral, painful.  Hypertrophic nails x 10.  Vascular: Dorsalis pedis pulses and Posterior tibial pulses are palpable faintly.  Orthopedic: Rectus foot with decreased plantar fat pad.  Neurologic: All epicritic and tactile sensations grossly intact.   Assessment:  Fissured heel with callus bilateral painful.  Onychomycosis x 10.  Treatment: All heel lesions debrided.  Return in 1 month or as needed

## 2013-02-04 NOTE — ED Provider Notes (Signed)
Medical screening examination/treatment/procedure(s) were performed by non-physician practitioner and as supervising physician I was immediately available for consultation/collaboration.  Leota Jacobsen, MD 02/04/13 [phone]

## 2013-02-21 DIAGNOSIS — I10 Essential (primary) hypertension: Secondary | ICD-10-CM | POA: Diagnosis not present

## 2013-02-21 DIAGNOSIS — R5383 Other fatigue: Secondary | ICD-10-CM | POA: Diagnosis not present

## 2013-02-21 DIAGNOSIS — N951 Menopausal and female climacteric states: Secondary | ICD-10-CM | POA: Diagnosis not present

## 2013-02-21 DIAGNOSIS — R634 Abnormal weight loss: Secondary | ICD-10-CM | POA: Diagnosis not present

## 2013-02-21 DIAGNOSIS — R61 Generalized hyperhidrosis: Secondary | ICD-10-CM | POA: Diagnosis not present

## 2013-02-21 DIAGNOSIS — R5381 Other malaise: Secondary | ICD-10-CM | POA: Diagnosis not present

## 2013-03-01 DIAGNOSIS — N952 Postmenopausal atrophic vaginitis: Secondary | ICD-10-CM | POA: Diagnosis not present

## 2013-03-01 DIAGNOSIS — N951 Menopausal and female climacteric states: Secondary | ICD-10-CM | POA: Diagnosis not present

## 2013-03-02 DIAGNOSIS — I10 Essential (primary) hypertension: Secondary | ICD-10-CM | POA: Diagnosis not present

## 2013-03-02 DIAGNOSIS — M545 Low back pain, unspecified: Secondary | ICD-10-CM | POA: Diagnosis not present

## 2013-03-07 ENCOUNTER — Ambulatory Visit (HOSPITAL_COMMUNITY)
Admission: RE | Admit: 2013-03-07 | Discharge: 2013-03-07 | Disposition: A | Discharge status: Home / Self Care | Payer: Medicare Other | Source: Ambulatory Visit | Attending: Vascular Surgery | Admitting: Vascular Surgery

## 2013-03-07 ENCOUNTER — Ambulatory Visit: Payer: Medicare Other | Admitting: Family

## 2013-03-07 DIAGNOSIS — I701 Atherosclerosis of renal artery: Secondary | ICD-10-CM | POA: Diagnosis not present

## 2013-03-07 DIAGNOSIS — Z48812 Encounter for surgical aftercare following surgery on the circulatory system: Secondary | ICD-10-CM | POA: Insufficient documentation

## 2013-04-11 NOTE — Patient Instructions (Signed)
Dear [name],  Your Right Renal Artery stent is fine.  The Left Renal Artery has high velocities - your PCP  Or Cardiologist or Nephrologists should keep close watch on your blood pressure.  We will order Lab Test for Blood  " Creatinine" when you return if not recently done. Follow up in One Year.        Renal Artery Stenosis Renal artery stenosis (RAS) is the narrowing of the artery that supplies blood to the kidney. If the narrowing is critical and the kidney does not get enough blood, hypertension (high blood pressure) can develop. This is called renal vascular hypertension (RVH). This is a common, uncommon cause of secondary hypertension. It does not usually happen until there is at least a 70% narrowing of the artery. Decreased blood flow through the renal artery causes the kidney to release increased amounts of a hormone. It is called renin. Renin is a strong blood pressure regulator. When it is high, it causes changes that lead to hypertension. Eventually the kidney not receiving enough blood may shrink in size and become less useful. The high blood pressure that is produced can eventually damage and destroy the remaining kidney. This is called hypertensive nephrosclerosis. If both kidneys fail, it will lead to chronic renal failure.  CAUSES  Most renal artery stenosis is caused by a hardening of the arteries (atherosclerosis). This is called Atherosclerotic Renal Artery Stenosis (AS-RAS). It is caused by a build-up of cholesterol (plaques) on the inner lining of the renal artery. A much less common cause is Fibromuscular Dysplasia (FMD). With it, there is an abnormality in the muscular lining of the renal artery. FMD-RAS occurs almost exclusively in women aged 35 to 20. It rarely affects African Americans or Asians.  SYMPTOMS  Often high blood pressure is discovered on a routine blood pressure check. It may be the only sign that something is wrong. Other problems that may occur are:  You  may develop calf pain when walking. This is called intermittent claudication. It may be a sign of bad circulation in the legs.  Inability to use certain blood pressure pills such as angiotensin-I (ACE-I) inhibitors or angiotensin receptor blockers (ARB's). These could cause sudden drops in blood pressure with worsening of kidney function.  More than three antihypertensive medications may be needed for blood pressure control.  New onset of high blood pressure if you are over 55. DIAGNOSIS  Your caregiver may find suggestions of this on exam if he finds bruits (like murmurs) on listening to your abdomen (belly) or the large arteries in your neck. Your caregiver may also suspect this there is a sudden worsening of your blood pressure when it has been well controlled and you are over age 70. Additional testing that may be done includes:  One diagnostic method used for renal artery stenosis (RAS) is to measure and compare the level of renin, (blood pressure-regulating hormone released by the kidneys), in the right to the left renal veins. If the amount of renin released by one-side is markedly higher than the other, this identifies a high renin-releasing kidney consistent with RAS.  FMD-RAS is often found on renal scan with ACE-inhibitor challenge, or ultrasound with Doppler.  FMD responds well to angioplasty and stenting. The results of stenting in FMD are usually long lasting. RISK FACTORS  Most renal artery stenosis is caused by a hardening of the arteries. This is called atherosclerosis. Other risk factors associated with the development of atherosclerotic RAS include the following:  Carotid artery disease.  Obesity.  High blood pressure.  Heredity.  Old age.  Fibromuscular dysplasia.  Diabetes mellitus.  Smoking.  Hardening of the arteries. TREATMENT   Renal vascular hypertension can be very severe. It can also be difficult to control.  Medication is used to control high blood  pressure (hypertension). Blood pressure medications that directly affect the renin angiotensin pathway can be used toe help control blood pressure. ACE inhibitors and angiotensin receptor blockers (ARB's) are often effective in patients with unilateral RAS. In some cases, patients with RAS are resistant to these medications.  In patients with bilateral RAS, these medications must be used carefully. They may cause acute renal failure (ARF). If acute renal failure develops (if creatinine increases by more than 30%), the medication is discontinued. The patient is evaluated for bilateral RAS.  Angioplasty and stenting may be used to improve blood flow. The goal is to improve the circulation of blood flow to the kidney and prevent the release of excess renin, which can help to decrease blood pressure. This helps to prevent atrophy of the kidney. In general, patients with AS-RAS should have stenting done. This is because plasty by itself has a high incidence of re-stenosis.  Surgery to bypass the narrowing may be done. If the kidney with RAS has diminished in size or strength (atrophied ), surgical removal of the kidney may be advised. This is called nephrectomy. Document Released: 09/18/2004 Document Revised: 03/17/2011 Document Reviewed: 12/23/2007 [facility] Patient Information 2014 [place].

## 2013-04-13 ENCOUNTER — Encounter: Payer: Self-pay | Admitting: Surgery

## 2013-04-15 NOTE — Progress Notes (Signed)
Lab only 

## 2013-04-29 DIAGNOSIS — M25569 Pain in unspecified knee: Secondary | ICD-10-CM | POA: Diagnosis not present

## 2013-04-29 DIAGNOSIS — Z733 Stress, not elsewhere classified: Secondary | ICD-10-CM | POA: Diagnosis not present

## 2013-05-03 ENCOUNTER — Ambulatory Visit (INDEPENDENT_AMBULATORY_CARE_PROVIDER_SITE_OTHER): Payer: Medicare Other | Admitting: Podiatry

## 2013-05-03 ENCOUNTER — Encounter: Payer: Self-pay | Admitting: Podiatry

## 2013-05-03 VITALS — BP 146/77 | HR 52

## 2013-05-03 DIAGNOSIS — L988 Other specified disorders of the skin and subcutaneous tissue: Secondary | ICD-10-CM

## 2013-05-03 DIAGNOSIS — B351 Tinea unguium: Secondary | ICD-10-CM

## 2013-05-03 DIAGNOSIS — R234 Changes in skin texture: Secondary | ICD-10-CM

## 2013-05-03 DIAGNOSIS — M25579 Pain in unspecified ankle and joints of unspecified foot: Secondary | ICD-10-CM | POA: Diagnosis not present

## 2013-05-03 NOTE — Patient Instructions (Signed)
Seen for hypertrophic nails. All nails debrided. Return in 3 months or as needed.  

## 2013-05-03 NOTE — Progress Notes (Signed)
Subjective:  78 year old female presents for a follow up on painful heel with fissure and toe nails. She has tried and kept up with foot care as instructed.   Objective: Dermatologic: Improved heel with minimum callus bilateral.  Hypertrophic nails x 10.  Vascular: Dorsalis pedis pulses and Posterior tibial pulses are palpable faintly.  Orthopedic: Rectus foot with decreased plantar fat pad.  Neurologic: All epicritic and tactile sensations grossly intact.   Assessment:  Fissured heel with callus bilateral improved.  Onychomycosis x 10.   Treatment: All heel lesions debrided.  All nails debrided. Return in 3 month or as needed

## 2013-05-10 DIAGNOSIS — I10 Essential (primary) hypertension: Secondary | ICD-10-CM | POA: Diagnosis not present

## 2013-05-10 DIAGNOSIS — Z733 Stress, not elsewhere classified: Secondary | ICD-10-CM | POA: Diagnosis not present

## 2013-05-17 DIAGNOSIS — H2589 Other age-related cataract: Secondary | ICD-10-CM | POA: Diagnosis not present

## 2013-05-23 DIAGNOSIS — R209 Unspecified disturbances of skin sensation: Secondary | ICD-10-CM | POA: Diagnosis not present

## 2013-05-31 ENCOUNTER — Other Ambulatory Visit: Payer: Self-pay | Admitting: *Deleted

## 2013-05-31 DIAGNOSIS — R208 Other disturbances of skin sensation: Secondary | ICD-10-CM

## 2013-06-06 DIAGNOSIS — R51 Headache: Secondary | ICD-10-CM | POA: Diagnosis not present

## 2013-06-10 ENCOUNTER — Emergency Department (HOSPITAL_COMMUNITY)
Admission: EM | Admit: 2013-06-10 | Discharge: 2013-06-10 | Disposition: A | Discharge status: Home / Self Care | Payer: Medicare Other | Attending: Emergency Medicine | Admitting: Emergency Medicine

## 2013-06-10 ENCOUNTER — Encounter (HOSPITAL_COMMUNITY): Payer: Self-pay | Admitting: Emergency Medicine

## 2013-06-10 DIAGNOSIS — Z8742 Personal history of other diseases of the female genital tract: Secondary | ICD-10-CM | POA: Diagnosis not present

## 2013-06-10 DIAGNOSIS — Z7982 Long term (current) use of aspirin: Secondary | ICD-10-CM | POA: Insufficient documentation

## 2013-06-10 DIAGNOSIS — Z79899 Other long term (current) drug therapy: Secondary | ICD-10-CM | POA: Diagnosis not present

## 2013-06-10 DIAGNOSIS — Z791 Long term (current) use of non-steroidal anti-inflammatories (NSAID): Secondary | ICD-10-CM | POA: Diagnosis not present

## 2013-06-10 DIAGNOSIS — G44209 Tension-type headache, unspecified, not intractable: Secondary | ICD-10-CM | POA: Diagnosis not present

## 2013-06-10 DIAGNOSIS — I10 Essential (primary) hypertension: Secondary | ICD-10-CM | POA: Insufficient documentation

## 2013-06-10 DIAGNOSIS — R51 Headache: Secondary | ICD-10-CM | POA: Diagnosis not present

## 2013-06-10 MED ORDER — ACETAMINOPHEN 325 MG PO TABS: 650.0000 mg | ORAL_TABLET | Freq: Four times a day (QID) | ORAL | Status: DC | PRN

## 2013-06-10 NOTE — ED Provider Notes (Signed)
CSN: [id number]     Arrival date & time 06/10/13  1745 History   First MD Initiated Contact with Patient 06/10/13 2307     Chief Complaint  Patient presents with  . Headache     (Consider location/radiation/quality/duration/timing/severity/associated sxs/prior Treatment) HPI This patient is a pleasant 78 year old woman with a history of well-controlled hypertension who is otherwise healthy. She presents because she has had intermittent episodes of short-lived pain at any point region of the left occipital skull. She's had this pain on average once or twice a day. It seems to come on randomly. It resolves without intervention. It is not associated with any neurologic deficits or any other neurologic symptoms.  The patient says she experiences pain tonight, told her husband and he told her that before coming to the emergency department to get to the bottom of the problem.  The patient denies any recent fevers. No neck pain. She notes that she has been doing water aerobics for exercise for muscle relaxation. However, a skin condition called to stop doing this and, since then, she has not really been doing any physical exercise  Past Medical History  Diagnosis Date  . Hypertension   . Atherosclerosis of renal artery   . Fibrocystic breast    Past Surgical History  Procedure Laterality Date  . Renal artery stent  2008    Right renal artery by Dr. Drucie Opitz  . Hemorroidectomy    . Tubal ligation  1970's  . Tonsillectomy     Family History  Problem Relation Age of Onset  . Stroke Mother 34  . Aneurysm Father     abdominal aortic aneurysm  . Cancer Sister 76    breast cancer   History  Substance Use Topics  . Smoking status: Never Smoker   . Smokeless tobacco: Never Used  . Alcohol Use: Yes     Comment: occasional   OB History   Grav Para Term Preterm Abortions TAB SAB Ect Mult Living   4 4 4  0 0 0 0 0 0 4     Review of Systems Ten point review of symptoms performed and is  negative with the exception of symptoms noted above.     Allergies  Tagamet  Home Medications   Prior to Admission medications   Medication Sig Start Date End Date Taking? Authorizing Provider  amLODipine (NORVASC) 5 MG tablet Take 5 mg by mouth 2 (two) times daily.    Yes Historical Provider, MD  aspirin 81 MG EC tablet Take 81 mg by mouth daily.     Yes Historical Provider, MD  Calcium Carbonate-Vitamin D (CALCIUM 600+D) 600-400 MG-UNIT per tablet Take 1 tablet by mouth daily.     Yes Historical Provider, MD  carvedilol (COREG) 12.5 MG tablet Take 12.5 mg by mouth 2 (two) times daily.  04/21/12  Yes Historical Provider, MD  losartan-hydrochlorothiazide (HYZAAR) 100-12.5 MG per tablet Take 1 tablet by mouth daily.     Yes Historical Provider, MD  Multiple Vitamin (MULTIVITAMIN PO) Take 1 tablet by mouth daily.    Yes Historical Provider, MD  naproxen (NAPROSYN) 375 MG tablet Take 375 mg by mouth 2 (two) times daily with a meal. PRN pain 01/29/13  Yes Clayton Bibles, PA-C  NITROSTAT 0.4 MG SL tablet Place 0.4 mg under the tongue every 5 (five) minutes as needed. For chest pain    Historical Provider, MD   BP 185/96  Pulse 63  Temp(Src) 98.5 F (36.9 C) (Oral)  Resp 18  Wt 116 lb (52.617 kg)  SpO2 100% Physical Exam Gen: well developed and well nourished appearing Head: NCAT Eyes: PERL, EOMI Nose: no epistaixis or rhinorrhea Mouth/throat: mucosa is moist and pink Neck: no midline ttp, palpable myofascial tension over the myofascia of the right parieto-occipital region region on right. Point TTP. Some mild excecerbation of pain can be brought on with passive ROM of the next.  Lungs: CTA B, no wheezing, rhonchi or rales CV: regular rate and rythm, good distal pulses.  Abd: soft, notender, nondistended Back: no ttp, no cva ttp Skin: warm and dry Ext: no edema, normal to inspection Neuro: CN ii-xii grossly intact, no focal deficits, normal speech, normal gait.  Psyche; normal affect,   calm and cooperative.  ED Course  Procedures (including critical care time)    MDM   I believe that the patient's symptoms are secondary to tension headache. We have discussed other broad differential diagnoses possibilities. The patient does not have risk factors for subarachnoid hemorrhage. Her symptoms are not consistent with any mass effect. She is exhibiting no signs of infection to suggest that this is a meningitic or encephalitic problem. She has no neurologic deficits. At this time, no imaging is indicated. The patient says she is about to start doing yoga with her husband. I suggested that she do this on regular basis probably should experience resolution of her pain with it. In the meantime, should she have exacerbations of pain, I have suggested Tylenol.    Elyn Peers, MD 06/10/13 [phone]

## 2013-06-10 NOTE — Discharge Instructions (Signed)

## 2013-06-10 NOTE — ED Notes (Signed)
Pt c/o intermittent pain to left lateral head, sts she had it first on Monday. Went to PCP on Monday and was seen for the same thing, he wasn't very impressed with it. sts every morning she has woken up with it but throughout the day it goes away sometimes. Nad, skin warm and dry, resp e/u.

## 2013-06-13 DIAGNOSIS — M531 Cervicobrachial syndrome: Secondary | ICD-10-CM | POA: Diagnosis not present

## 2013-06-16 ENCOUNTER — Ambulatory Visit (INDEPENDENT_AMBULATORY_CARE_PROVIDER_SITE_OTHER): Payer: Medicare Other | Admitting: Neurology

## 2013-06-16 DIAGNOSIS — R209 Unspecified disturbances of skin sensation: Secondary | ICD-10-CM

## 2013-06-16 DIAGNOSIS — R208 Other disturbances of skin sensation: Secondary | ICD-10-CM

## 2013-06-16 NOTE — Procedures (Signed)
[facility]  [address], [address]  [place], [place] [address] Tel: [phone] Fax:  [phone] Test Date:  06/16/2013  Patient: Donna [name] DOB: 01-Nov-1935 Physician: Narda Amber, DO  Sex: Female Height: 5\' 0"  Ref Phys: Wenda Low  ID#: [id number] Temp: 34.8C Technician:    Patient Complaints: This is a 78 year-old female presenting for evaluation of right hand numbness and tingling.  NCV & EMG Findings: Extensive electrodiagnostic evaluation of the right upper extremity reveals:  1. Normal median, radial, and palmar sensory responses.  2. Evaluation of the right ulnar motor nerve showed decreased conduction velocity (A Elbow-B Elbow, 45 m/s), with preserved amplitude and latency.  The median motor responses within normal limits.  3. There is sparse chronic motor axon loss changes affecting C5-myotomes on the right side without accompanied active denervation.  Ulnar-innervated muscles show normal motor unit configuration and recruitment pattern.  Impression: 1. 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  2. Chronic right C5 radiculopathy, mild in degree electrically. 3. There is no evidence of carpal tunnel syndrome affecting the right upper extremity.   ___________________________ Narda Amber, DO    Nerve Conduction Studies Anti Sensory Summary Table   Site NR Peak (ms) Norm Peak (ms) P-T Amp (V) Norm P-T Amp  Right Median Anti Sensory (2nd Digit)  Wrist    3.0 <3.8 21.7 >10  Right Radial Anti Sensory (Base 1st Digit)  Wrist    2.2 <2.8 36.6 >10  Right Ulnar Anti Sensory (5th Digit)  Wrist    2.7 <3.2 28.2 >5   Motor Summary Table   Site NR Onset (ms) Norm Onset (ms) O-P Amp (mV) Norm O-P Amp Site1 Site2 Delta-0 (ms) Dist (cm) Vel (m/s) Norm Vel (m/s)  Right Median Motor (Abd Poll Brev)  Wrist    2.9 <4.0 8.8 >5 Elbow Wrist 5.0 27.0 54 >50  Elbow    7.9  8.1         Right Ulnar Motor (Abd Dig Minimi)    Wrist    2.1 <3.1 11.9 >7 B Elbow Wrist 3.4 22.0 65 >50  B Elbow    5.5  9.7  A Elbow B Elbow 2.2 10.0 45 >50  A Elbow    7.7  9.6         Right Ulnar (FDI) Motor (1st DI)  Wrist    3.6 <4.5 12.4 >7         Comparison Summary Table   Site NR Peak (ms) Norm Peak (ms) P-T Amp (V) Site1 Site2 Delta-P (ms) Norm Delta (ms)  Right Median/Ulnar Palm Comparison (Wrist - 8cm)  Median Palm    1.7 <2.2 56.9 Median Palm Ulnar Palm 0.1   Ulnar Palm    1.6 <2.2 18.2       EMG   Side Muscle Ins Act Fibs Psw Fasc Number Recrt Dur Dur. Amp Amp. Poly Poly. Comment  Right 1stDorInt Nml Nml Nml Nml Nml Nml Nml Nml Nml Nml Nml Nml N/A  Right FlexPolLong Nml Nml Nml Nml Nml Nml Nml Nml Nml Nml Nml Nml N/A  Right PronatorTeres Nml Nml Nml Nml Nml Nml Nml Nml Nml Nml Nml Nml N/A  Right Biceps Nml Nml Nml Nml 1- Mod-R Some 1+ Few 1+ Nml Nml N/A  Right Triceps Nml Nml Nml Nml Nml Nml Nml Nml Nml Nml Nml Nml N/A  Right Deltoid Nml Nml Nml Nml 1- Mod-R Few 1+ Some 1+ Nml Nml N/A  Right FlexDigProf 4,5 Nml Nml Nml Nml Nml Nml Nml Nml Nml Nml Nml Nml N/A  Right Infraspinatus Nml Nml Nml Nml 1- Mod Few 1+ Nml Nml Nml Nml N/A      Waveforms:

## 2013-06-21 DIAGNOSIS — Z733 Stress, not elsewhere classified: Secondary | ICD-10-CM | POA: Diagnosis not present

## 2013-06-21 DIAGNOSIS — Z09 Encounter for follow-up examination after completed treatment for conditions other than malignant neoplasm: Secondary | ICD-10-CM | POA: Diagnosis not present

## 2013-06-21 DIAGNOSIS — I10 Essential (primary) hypertension: Secondary | ICD-10-CM | POA: Diagnosis not present

## 2013-06-21 DIAGNOSIS — R51 Headache: Secondary | ICD-10-CM | POA: Diagnosis not present

## 2013-06-21 DIAGNOSIS — M531 Cervicobrachial syndrome: Secondary | ICD-10-CM | POA: Diagnosis not present

## 2013-06-21 DIAGNOSIS — R5381 Other malaise: Secondary | ICD-10-CM | POA: Diagnosis not present

## 2013-06-21 DIAGNOSIS — N6009 Solitary cyst of unspecified breast: Secondary | ICD-10-CM | POA: Diagnosis not present

## 2013-06-21 DIAGNOSIS — R5383 Other fatigue: Secondary | ICD-10-CM | POA: Diagnosis not present

## 2013-06-24 ENCOUNTER — Other Ambulatory Visit: Payer: Self-pay | Admitting: Internal Medicine

## 2013-06-24 DIAGNOSIS — R51 Headache: Secondary | ICD-10-CM

## 2013-06-27 DIAGNOSIS — F4323 Adjustment disorder with mixed anxiety and depressed mood: Secondary | ICD-10-CM | POA: Diagnosis not present

## 2013-06-28 ENCOUNTER — Ambulatory Visit
Admission: RE | Admit: 2013-06-28 | Discharge: 2013-06-28 | Disposition: A | Discharge status: Home / Self Care | Payer: Medicare Other | Source: Ambulatory Visit | Attending: Internal Medicine | Admitting: Internal Medicine

## 2013-06-28 DIAGNOSIS — R51 Headache: Secondary | ICD-10-CM

## 2013-07-21 ENCOUNTER — Encounter: Payer: Medicare Other | Admitting: Neurology

## 2013-07-27 DIAGNOSIS — F4323 Adjustment disorder with mixed anxiety and depressed mood: Secondary | ICD-10-CM | POA: Diagnosis not present

## 2013-08-02 ENCOUNTER — Ambulatory Visit: Payer: Medicare Other | Admitting: Podiatry

## 2013-08-12 DIAGNOSIS — Z113 Encounter for screening for infections with a predominantly sexual mode of transmission: Secondary | ICD-10-CM | POA: Diagnosis not present

## 2013-08-12 DIAGNOSIS — Z124 Encounter for screening for malignant neoplasm of cervix: Secondary | ICD-10-CM | POA: Diagnosis not present

## 2013-08-29 ENCOUNTER — Telehealth (INDEPENDENT_AMBULATORY_CARE_PROVIDER_SITE_OTHER): Payer: Self-pay

## 2013-08-29 NOTE — Telephone Encounter (Signed)
Called to r/s appt on 9/4 b/c this appt was placed in a wrong template for a breast cancer pt. I r/s the appt to 9/28 to see Dr Donne Hazel in a new pt template. Pt's husband will notify the pt.

## 2013-09-06 DIAGNOSIS — Z23 Encounter for immunization: Secondary | ICD-10-CM | POA: Diagnosis not present

## 2013-09-06 DIAGNOSIS — E559 Vitamin D deficiency, unspecified: Secondary | ICD-10-CM | POA: Diagnosis not present

## 2013-09-06 DIAGNOSIS — M531 Cervicobrachial syndrome: Secondary | ICD-10-CM | POA: Diagnosis not present

## 2013-09-06 DIAGNOSIS — Z0000 Encounter for general adult medical examination without abnormal findings: Secondary | ICD-10-CM | POA: Diagnosis not present

## 2013-09-06 DIAGNOSIS — Z1331 Encounter for screening for depression: Secondary | ICD-10-CM | POA: Diagnosis not present

## 2013-09-06 DIAGNOSIS — I10 Essential (primary) hypertension: Secondary | ICD-10-CM | POA: Diagnosis not present

## 2013-09-06 DIAGNOSIS — Z733 Stress, not elsewhere classified: Secondary | ICD-10-CM | POA: Diagnosis not present

## 2013-09-09 ENCOUNTER — Ambulatory Visit (INDEPENDENT_AMBULATORY_CARE_PROVIDER_SITE_OTHER): Payer: Self-pay | Admitting: General Surgery

## 2013-09-17 ENCOUNTER — Emergency Department (HOSPITAL_COMMUNITY): Payer: Medicare Other

## 2013-09-17 ENCOUNTER — Encounter (HOSPITAL_COMMUNITY): Payer: Self-pay | Admitting: Emergency Medicine

## 2013-09-17 ENCOUNTER — Emergency Department (HOSPITAL_COMMUNITY)
Admission: EM | Admit: 2013-09-17 | Discharge: 2013-09-17 | Disposition: A | Discharge status: Home / Self Care | Payer: Medicare Other | Attending: Emergency Medicine | Admitting: Emergency Medicine

## 2013-09-17 DIAGNOSIS — M542 Cervicalgia: Secondary | ICD-10-CM | POA: Diagnosis not present

## 2013-09-17 DIAGNOSIS — Z8742 Personal history of other diseases of the female genital tract: Secondary | ICD-10-CM | POA: Diagnosis not present

## 2013-09-17 DIAGNOSIS — Z79899 Other long term (current) drug therapy: Secondary | ICD-10-CM | POA: Insufficient documentation

## 2013-09-17 DIAGNOSIS — M5380 Other specified dorsopathies, site unspecified: Secondary | ICD-10-CM | POA: Diagnosis not present

## 2013-09-17 DIAGNOSIS — I6529 Occlusion and stenosis of unspecified carotid artery: Secondary | ICD-10-CM | POA: Diagnosis not present

## 2013-09-17 DIAGNOSIS — I10 Essential (primary) hypertension: Secondary | ICD-10-CM | POA: Insufficient documentation

## 2013-09-17 DIAGNOSIS — M62838 Other muscle spasm: Secondary | ICD-10-CM

## 2013-09-17 DIAGNOSIS — M531 Cervicobrachial syndrome: Secondary | ICD-10-CM | POA: Diagnosis not present

## 2013-09-17 DIAGNOSIS — R079 Chest pain, unspecified: Secondary | ICD-10-CM | POA: Diagnosis not present

## 2013-09-17 DIAGNOSIS — Z791 Long term (current) use of non-steroidal anti-inflammatories (NSAID): Secondary | ICD-10-CM | POA: Diagnosis not present

## 2013-09-17 DIAGNOSIS — Z7982 Long term (current) use of aspirin: Secondary | ICD-10-CM | POA: Diagnosis not present

## 2013-09-17 DIAGNOSIS — R9431 Abnormal electrocardiogram [ECG] [EKG]: Secondary | ICD-10-CM | POA: Diagnosis not present

## 2013-09-17 DIAGNOSIS — M25512 Pain in left shoulder: Secondary | ICD-10-CM

## 2013-09-17 DIAGNOSIS — I517 Cardiomegaly: Secondary | ICD-10-CM | POA: Diagnosis not present

## 2013-09-17 LAB — CBC WITH DIFFERENTIAL/PLATELET
BASOS PCT: 1 % (ref 0–1)
Basophils Absolute: 0 10*3/uL (ref 0.0–0.1)
Eosinophils Absolute: 0.1 10*3/uL (ref 0.0–0.7)
Eosinophils Relative: 3 % (ref 0–5)
HEMATOCRIT: 36.2 % (ref 36.0–46.0)
HEMOGLOBIN: 12.6 g/dL (ref 12.0–15.0)
LYMPHS ABS: 1.2 10*3/uL (ref 0.7–4.0)
Lymphocytes Relative: 34 % (ref 12–46)
MCH: 31.6 pg (ref 26.0–34.0)
MCHC: 34.8 g/dL (ref 30.0–36.0)
MCV: 90.7 fL (ref 78.0–100.0)
MONOS PCT: 10 % (ref 3–12)
Monocytes Absolute: 0.3 10*3/uL (ref 0.1–1.0)
NEUTROS ABS: 1.8 10*3/uL (ref 1.7–7.7)
Neutrophils Relative %: 52 % (ref 43–77)
Platelets: 173 10*3/uL (ref 150–400)
RBC: 3.99 MIL/uL (ref 3.87–5.11)
RDW: 13.1 % (ref 11.5–15.5)
WBC: 3.4 10*3/uL — ABNORMAL LOW (ref 4.0–10.5)

## 2013-09-17 LAB — COMPREHENSIVE METABOLIC PANEL
ALT: 15 U/L (ref 0–35)
ANION GAP: 11 (ref 5–15)
AST: 18 U/L (ref 0–37)
Albumin: 3.6 g/dL (ref 3.5–5.2)
Alkaline Phosphatase: 40 U/L (ref 39–117)
BILIRUBIN TOTAL: 0.7 mg/dL (ref 0.3–1.2)
BUN: 20 mg/dL (ref 6–23)
CHLORIDE: 105 meq/L (ref 96–112)
CO2: 24 mEq/L (ref 19–32)
Calcium: 9.4 mg/dL (ref 8.4–10.5)
Creatinine, Ser: 0.98 mg/dL (ref 0.50–1.10)
GFR calc non Af Amer: 55 mL/min — ABNORMAL LOW (ref >90)
GFR, EST AFRICAN AMERICAN: 63 mL/min — AB (ref >90)
GLUCOSE: 156 mg/dL — AB (ref 70–99)
Potassium: 4 mEq/L (ref 3.7–5.3)
Sodium: 140 mEq/L (ref 137–147)
Total Protein: 6.6 g/dL (ref 6.0–8.3)

## 2013-09-17 LAB — TROPONIN I

## 2013-09-17 MED ORDER — OXYCODONE-ACETAMINOPHEN 5-325 MG PO TABS
1.0000 | ORAL_TABLET | Freq: Once | ORAL | Status: AC
  Administered 2013-09-17: 1 via ORAL
  Filled 2013-09-17: qty 1

## 2013-09-17 MED ORDER — IOHEXOL 350 MG/ML SOLN
50.0000 mL | Freq: Once | INTRAVENOUS | Status: AC | PRN
  Administered 2013-09-17: 50 mL via INTRAVENOUS

## 2013-09-17 MED ORDER — LIDOCAINE HCL (PF) 1 % IJ SOLN
30.0000 mL | Freq: Once | INTRAMUSCULAR | Status: AC
  Administered 2013-09-17: 30 mL
  Filled 2013-09-17: qty 30

## 2013-09-17 MED ORDER — NAPROXEN 375 MG PO TABS: 375.0000 mg | ORAL_TABLET | Freq: Two times a day (BID) | ORAL | Status: DC

## 2013-09-17 NOTE — Discharge Instructions (Signed)
Trigger Point Injection Trigger points are areas where you have muscle pain. A trigger point injection is a shot given in the trigger point to relieve that pain. A trigger point might feel like a knot in your muscle. It hurts to press on a trigger point. Sometimes the pain spreads out (radiates) to other parts of the body. For example, pressing on a trigger point in your shoulder might cause pain in your arm or neck. You might have one trigger point. Or, you might have more than one. People often have trigger points in their upper back and lower back. They also occur often in the neck and shoulders. Pain from a trigger point lasts for a long time. It can make it hard to keep moving. You might not be able to do the exercise or physical therapy that could help you deal with the pain. A trigger point injection may help. It does not work for everyone. But, it may relieve your pain for a few days or a few months. A trigger point injection does not cure long-lasting (chronic) pain. LET YOUR CAREGIVER KNOW ABOUT:  Any allergies (especially to latex, lidocaine, or steroids).  Blood-thinning medicines that you take. These drugs can lead to bleeding or bruising after an injection. They include:  Aspirin.  Ibuprofen.  Clopidogrel.  Warfarin.  Other medicines you take. This includes all vitamins, herbs, eyedrops, over-the-counter medicines, and creams.  Use of steroids.  Recent infections.  Past problems with numbing medicines.  Bleeding problems.  Surgeries you have had.  Other health problems. RISKS AND COMPLICATIONS A trigger point injection is a safe treatment. However, problems may develop, such as:  Minor side effects usually go away in 1 to 2 days. These may include:  Soreness.  Bruising.  Stiffness.  More serious problems are rare. But, they may include:  Bleeding under the skin (hematoma).  Skin infection.  Breaking off of the needle under your skin.  Lung  puncture.  The trigger point injection may not work for you. BEFORE THE PROCEDURE You may need to stop taking any medicine that thins your blood. This is to prevent bleeding and bruising. Usually these medicines are stopped several days before the injection. No other preparation is needed. PROCEDURE  A trigger point injection can be given in your caregiver's office or in a clinic. Each injection takes 2 minutes or less.  Your caregiver will feel for trigger points. The caregiver may use a marker to circle the area for the injection.  The skin over the trigger point will be washed with a germ-killing (antiseptic) solution.  The caregiver pinches the spot for the injection.  Then, a very thin needle is used for the shot. You may feel pain or a twitching feeling when the needle enters the trigger point.  A numbing solution may be injected into the trigger point. Sometimes a drug to keep down swelling, redness, and warmth (inflammation) is also injected.  Your caregiver moves the needle around the trigger zone until the tightness and twitching goes away.  After the injection, your caregiver may put gentle pressure over the injection site.  Then it is covered with a bandage. AFTER THE PROCEDURE  You can go right home after the injection.  The bandage can be taken off after a few hours.  You may feel sore and stiff for 1 to 2 days.  Go back to your regular activities slowly. Your caregiver may ask you to stretch your muscles. Do not do anything that takes   extra energy for a few days.  Follow your caregiver's instructions to manage and treat other pain. Document Released: 12/12/2010 Document Revised: 04/19/2012 Document Reviewed: 12/12/2010 [facility] Patient Information 2015 [place], [place]. This information is not intended to replace advice given to you by your health care provider. Make sure you discuss any questions you have with your health care provider.  Heat Therapy Heat therapy  can help ease sore, stiff, injured, and tight muscles and joints. Heat relaxes your muscles, which may help ease your pain.  RISKS AND COMPLICATIONS If you have any of the following conditions, do not use heat therapy unless your health care provider has approved:  Poor circulation.  Healing wounds or scarred skin in the area being treated.  Diabetes, heart disease, or high blood pressure.  Not being able to feel (numbness) the area being treated.  Unusual swelling of the area being treated.  Active infections.  Blood clots.  Cancer.  Inability to communicate pain. This may include young children and people who have problems with their brain function (dementia).  Pregnancy. Heat therapy should only be used on old, pre-existing, or long-lasting (chronic) injuries. Do not use heat therapy on new injuries unless directed by your health care provider. HOW TO USE HEAT THERAPY There are several different kinds of heat therapy, including:  Moist heat pack.  Warm water bath.  Hot water bottle.  Electric heating pad.  Heated gel pack.  Heated wrap.  Electric heating pad. Use the heat therapy method suggested by your health care provider. Follow your health care provider's instructions on when and how to use heat therapy. GENERAL HEAT THERAPY RECOMMENDATIONS  Do not sleep while using heat therapy. Only use heat therapy while you are awake.  Your skin may turn pink while using heat therapy. Do not use heat therapy if your skin turns red.  Do not use heat therapy if you have new pain.  High heat or long exposure to heat can cause burns. Be careful when using heat therapy to avoid burning your skin.  Do not use heat therapy on areas of your skin that are already irritated, such as with a rash or sunburn. SEEK MEDICAL CARE IF:  You have blisters, redness, swelling, or numbness.  You have new pain.  Your pain is worse. MAKE SURE YOU:  Understand these  instructions.  Will watch your condition.  Will get help right away if you are not doing well or get worse. Document Released: 03/17/2011 Document Revised: 05/09/2013 Document Reviewed: 02/15/2013 [facility] Patient Information 2015 [place], [place]. This information is not intended to replace advice given to you by your health care provider. Make sure you discuss any questions you have with your health care provider.

## 2013-09-17 NOTE — ED Notes (Signed)
Pt. Stated, i started having left side neck pain last night

## 2013-09-17 NOTE — ED Notes (Signed)
PT moved to A-2 for cardiac monitoring because of changes in B/P and HR.

## 2013-09-17 NOTE — ED Provider Notes (Signed)
CSN: [id number]     Arrival date & time 09/17/13  0808 History   First MD Initiated Contact with Patient 09/17/13 0840     Chief Complaint  Patient presents with  . Neck Pain     (Consider location/radiation/quality/duration/timing/severity/associated sxs/prior Treatment) HPI Donna [name] This 78 year old female with past medical history of hypertension, renal artery disease who presents to the emergency department with chief complaint of left neck pain. Patient states that she awoke this morning with pain in the left shoulder and neck. She states that it feels like a muscle spasm and is worse with movement of the neck or head. Better when she keeps her head straight and upright. She denies chest pain, shortness of breath, radiation down the left arm, paresthesia. She denies any headaches, changes in vision. Patient applied analgesic ointment to the left shoulder without relief.  Past Medical History  Diagnosis Date  . Hypertension   . Atherosclerosis of renal artery   . Fibrocystic breast    Past Surgical History  Procedure Laterality Date  . Renal artery stent  2008    Right renal artery by Dr. Drucie Opitz  . Hemorroidectomy    . Tubal ligation  1970's  . Tonsillectomy     Family History  Problem Relation Age of Onset  . Stroke Mother 88  . Aneurysm Father     abdominal aortic aneurysm  . Cancer Sister 38    breast cancer   History  Substance Use Topics  . Smoking status: Never Smoker   . Smokeless tobacco: Never Used  . Alcohol Use: Yes     Comment: occasional   OB History   Grav Para Term Preterm Abortions TAB SAB Ect Mult Living   4 4 4  0 0 0 0 0 0 4     Review of Systems Ten systems reviewed and are negative for acute change, except as noted in the HPI.     Allergies  Tagamet  Home Medications   Prior to Admission medications   Medication Sig Start Date End Date Taking? Authorizing Provider  amLODipine (NORVASC) 5 MG tablet Take 5 mg by mouth 2 (two)  times daily.     Historical Provider, MD  aspirin 81 MG EC tablet Take 81 mg by mouth daily.      Historical Provider, MD  Calcium Carbonate-Vitamin D (CALCIUM 600+D) 600-400 MG-UNIT per tablet Take 1 tablet by mouth daily.      Historical Provider, MD  carvedilol (COREG) 12.5 MG tablet Take 12.5 mg by mouth 2 (two) times daily.  04/21/12   Historical Provider, MD  losartan-hydrochlorothiazide (HYZAAR) 100-12.5 MG per tablet Take 1 tablet by mouth daily.      Historical Provider, MD  Multiple Vitamin (MULTIVITAMIN PO) Take 1 tablet by mouth daily.     Historical Provider, MD  naproxen (NAPROSYN) 375 MG tablet Take 375 mg by mouth 2 (two) times daily with a meal. PRN pain 01/29/13   Clayton Bibles, PA-C  NITROSTAT 0.4 MG SL tablet Place 0.4 mg under the tongue every 5 (five) minutes as needed. For chest pain    Historical Provider, MD   BP 150/63  Pulse 56  Temp(Src) 97.5 F (36.4 C) (Oral)  Resp 16  Ht 5\' 2"  (1.575 m)  Wt 114 lb (51.71 kg)  BMI 20.85 kg/m2  SpO2 100% Physical Exam  Nursing note and vitals reviewed. Constitutional: She is oriented to person, place, and time. She appears well-developed and well-nourished. No distress.  HENT:  Head: Normocephalic and atraumatic.    Eyes: Conjunctivae are normal. No scleral icterus.  Neck: Normal range of motion.  Cardiovascular: Normal rate, regular rhythm and normal heart sounds.  Exam reveals no gallop and no friction rub.   No murmur heard. Pulmonary/Chest: Effort normal and breath sounds normal. No respiratory distress.  Abdominal: Soft. Bowel sounds are normal. She exhibits no distension and no mass. There is no tenderness. There is no guarding.  Musculoskeletal:  Limited ROM with movement. Pain with flexion and R lateral flexion of the neck. Pain is reproducible with palpation of the neck.   Neurological: She is alert and oriented to person, place, and time.  Skin: Skin is warm and dry. She is not diaphoretic.    ED Course    Procedures (including critical care time) Labs Review Labs Reviewed - No data to display  Imaging Review No results found.   EKG Interpretation None        A trigger point injection was performed at the site of maximal tenderness using 1% plain Lidocaine. This was well tolerated, and followed by complete pain relief relief of pain.  Trigger point injection Performed by: [name] Consent: Verbal consent obtained. Time out: Immediately prior to procedure a "time out" was called to verify the correct patient, procedure, equipment, support staff and site/side marked as required.  Indication: pain relief Trigger point body site: Left shoulder  Preparation: Patient was prepped and draped in the usual sterile fashion. Needle gauge: 60 G Location technique: anatomical landmarks  Local anesthetic: 1% lidocaine Anesthetic total: 4 ml  Outcome: pain improved Patient tolerance: Patient tolerated the procedure well with no immediate complications.   MDM   Final diagnoses:  Muscle spasms of neck  Trigger point of left shoulder region    Patient with trigger points pain improved with injections will d/c with pcp f/u and nsaids    9:24 AM Filed Vitals:   09/17/13 0812 09/17/13 0916 09/17/13 0918  BP: 150/63  93/47  Pulse: 56  37  Temp: 97.5 F (36.4 C) 97.7 F (36.5 C)   TempSrc: Oral Oral   Resp: 16  12  Height: 5\' 2"  (1.575 m)    Weight: 114 lb (51.71 kg)    SpO2: 100% 94%     Patient with significant drop in her blood pressure and heart rate. The patient was only given 4 mL of 1% lidocaine was should not affect her cardiac activity. I will start a workup to make sure that she is stable. Patient states she did take all of her antihypertensive medications just prior to arrival at the emergency department. I've spoken with Dr. Wyvonnia Dusky who asks that she will move to higher acuity.  Care assumed by Dr. Rogene Houston, PA-C 09/21/13 [phone]

## 2013-09-17 NOTE — ED Notes (Signed)
Patient transported to CT 

## 2013-09-17 NOTE — ED Provider Notes (Signed)
Medical screening examination/treatment/procedure(s) were conducted as a shared visit with non-physician practitioner(s) and myself.  I personally evaluated the patient during the encounter.  Atraumatic left posterior neck pain. No weakness, numbness or tingling. No chest pain or shortness of breath. Patient noted to be bradycardic in fast track and moved to acute bed. Denies any dizziness or lightheadedness. Sinus bradycardia of 44 on EKG. Equal grip strengths and pulses bilaterally. Left carotid bruit on exam. 60% stenosis of left carotid artery seen but is not flow limiting. fibro muscular dysplasia as well. She  Is already on aspirin.  D/w Dr. Armida Sans who agrees. She sees Dr. Bridgett Larsson for her renal artery stenosis and will be able to follow up with him. HR improved to 60s.  Similar to previous ED visits. She is on amlodipine.  Denies CP, SOB, dizziness, lightheadedness, palpitations.    EKG Interpretation  Date/Time:  Saturday September 17 2013 09:38:00 EDT Ventricular Rate:  44 PR Interval:  206 QRS Duration: 87 QT Interval:  450 QTC Calculation: 385 R Axis:   90 Text Interpretation:  Right and left arm electrode reversal, interpretation assumes no reversal Sinus bradycardia Anterior infarct, old No significant change was found Confirmed by Wyvonnia Dusky  MD, Leola Fiore [phone]) on 09/17/2013 9:43:27 AM          Ezequiel Essex, MD 09/17/13 1723

## 2013-09-19 ENCOUNTER — Encounter (HOSPITAL_COMMUNITY): Payer: Self-pay | Admitting: Emergency Medicine

## 2013-09-19 ENCOUNTER — Emergency Department (HOSPITAL_COMMUNITY)
Admission: EM | Admit: 2013-09-19 | Discharge: 2013-09-19 | Disposition: A | Discharge status: Home / Self Care | Payer: Medicare Other | Attending: Emergency Medicine | Admitting: Emergency Medicine

## 2013-09-19 DIAGNOSIS — X58XXXA Exposure to other specified factors, initial encounter: Secondary | ICD-10-CM | POA: Diagnosis not present

## 2013-09-19 DIAGNOSIS — Z8742 Personal history of other diseases of the female genital tract: Secondary | ICD-10-CM | POA: Diagnosis not present

## 2013-09-19 DIAGNOSIS — I10 Essential (primary) hypertension: Secondary | ICD-10-CM | POA: Insufficient documentation

## 2013-09-19 DIAGNOSIS — Z791 Long term (current) use of non-steroidal anti-inflammatories (NSAID): Secondary | ICD-10-CM | POA: Insufficient documentation

## 2013-09-19 DIAGNOSIS — Y939 Activity, unspecified: Secondary | ICD-10-CM | POA: Insufficient documentation

## 2013-09-19 DIAGNOSIS — Z79899 Other long term (current) drug therapy: Secondary | ICD-10-CM | POA: Insufficient documentation

## 2013-09-19 DIAGNOSIS — S46819D Strain of other muscles, fascia and tendons at shoulder and upper arm level, unspecified arm, subsequent encounter: Secondary | ICD-10-CM

## 2013-09-19 DIAGNOSIS — S43499A Other sprain of unspecified shoulder joint, initial encounter: Secondary | ICD-10-CM | POA: Diagnosis not present

## 2013-09-19 DIAGNOSIS — Z7982 Long term (current) use of aspirin: Secondary | ICD-10-CM | POA: Insufficient documentation

## 2013-09-19 DIAGNOSIS — S46819A Strain of other muscles, fascia and tendons at shoulder and upper arm level, unspecified arm, initial encounter: Principal | ICD-10-CM

## 2013-09-19 DIAGNOSIS — Y929 Unspecified place or not applicable: Secondary | ICD-10-CM | POA: Insufficient documentation

## 2013-09-19 DIAGNOSIS — M546 Pain in thoracic spine: Secondary | ICD-10-CM | POA: Diagnosis not present

## 2013-09-19 DIAGNOSIS — M542 Cervicalgia: Secondary | ICD-10-CM | POA: Insufficient documentation

## 2013-09-19 MED ORDER — ORPHENADRINE CITRATE ER 100 MG PO TB12
100.0000 mg | ORAL_TABLET | Freq: Once | ORAL | Status: DC
  Filled 2013-09-19: qty 1

## 2013-09-19 MED ORDER — HYDROCODONE-ACETAMINOPHEN 5-325 MG PO TABS: 2.0000 | ORAL_TABLET | ORAL | Status: DC | PRN

## 2013-09-19 MED ORDER — ORPHENADRINE CITRATE ER 100 MG PO TB12: 100.0000 mg | ORAL_TABLET | Freq: Two times a day (BID) | ORAL | Status: DC | PRN

## 2013-09-19 MED ORDER — HYDROCODONE-ACETAMINOPHEN 5-325 MG PO TABS
2.0000 | ORAL_TABLET | Freq: Once | ORAL | Status: AC
  Administered 2013-09-19: 2 via ORAL
  Filled 2013-09-19: qty 2

## 2013-09-19 NOTE — ED Provider Notes (Signed)
CSN: [id number]     Arrival date & time 09/19/13  3545 History   First MD Initiated Contact with Patient 09/19/13 1339     Chief Complaint  Patient presents with  . Neck Pain     (Consider location/radiation/quality/duration/timing/severity/associated sxs/prior Treatment) HPI Comments: 78 year-old female with high blood pressure, renal artery stenosis presents to the ER for worsening upper back and lower neck pain. Pain is worse with certain positions and palpation. Patient denies weakness, numbness, bowel or bladder changes. No history of surgery in the area. No significant weight loss or fevers. Patient was recently seen in the ER and had CT angina the neck and understands she is to followup for her carotid artery stenosis outpatient. Patient has no stroke symptoms. Pain worse in the trapezius bilateral.  Patient is a 78 y.o. female presenting with neck pain. The history is provided by the patient.  Neck Pain Associated symptoms: no chest pain, no fever, no headaches, no numbness and no weakness     Past Medical History  Diagnosis Date  . Hypertension   . Atherosclerosis of renal artery   . Fibrocystic breast    Past Surgical History  Procedure Laterality Date  . Renal artery stent  2008    Right renal artery by Dr. Drucie Opitz  . Hemorroidectomy    . Tubal ligation  1970's  . Tonsillectomy     Family History  Problem Relation Age of Onset  . Stroke Mother 67  . Aneurysm Father     abdominal aortic aneurysm  . Cancer Sister 21    breast cancer   History  Substance Use Topics  . Smoking status: Never Smoker   . Smokeless tobacco: Never Used  . Alcohol Use: Yes     Comment: occasional   OB History   Grav Para Term Preterm Abortions TAB SAB Ect Mult Living   4 4 4  0 0 0 0 0 0 4     Review of Systems  Constitutional: Negative for fever and chills.  HENT: Negative for congestion.   Eyes: Negative for visual disturbance.  Respiratory: Negative for shortness of  breath.   Cardiovascular: Negative for chest pain.  Gastrointestinal: Negative for vomiting and abdominal pain.  Genitourinary: Negative for dysuria and flank pain.  Musculoskeletal: Positive for back pain and neck pain. Negative for neck stiffness.  Skin: Negative for rash.  Neurological: Negative for weakness, light-headedness, numbness and headaches.      Allergies  Tagamet  Home Medications   Prior to Admission medications   Medication Sig Start Date End Date Taking? Authorizing Provider  amLODipine (NORVASC) 5 MG tablet Take 5 mg by mouth 2 (two) times daily.     Historical Provider, MD  aspirin 81 MG EC tablet Take 81 mg by mouth daily.      Historical Provider, MD  Calcium Carbonate-Vitamin D (CALCIUM 600+D) 600-400 MG-UNIT per tablet Take 1 tablet by mouth daily.      Historical Provider, MD  carvedilol (COREG) 12.5 MG tablet Take 12.5 mg by mouth 2 (two) times daily.  04/21/12   Historical Provider, MD  HYDROcodone-acetaminophen (NORCO) 5-325 MG per tablet Take 2 tablets by mouth every 4 (four) hours as needed. 09/19/13   [name], MD  losartan-hydrochlorothiazide (HYZAAR) 100-12.5 MG per tablet Take 1 tablet by mouth daily.      Historical Provider, MD  Multiple Vitamin (MULTIVITAMIN PO) Take 1 tablet by mouth daily.     Historical Provider, MD  naproxen (NAPROSYN)  375 MG tablet Take 1 tablet (375 mg total) by mouth 2 (two) times daily with a meal. PRN pain 09/17/13   [name], PA-C  NITROSTAT 0.4 MG SL tablet Place 0.4 mg under the tongue every 5 (five) minutes as needed for chest pain.     Historical Provider, MD  orphenadrine (NORFLEX) 100 MG tablet Take 1 tablet (100 mg total) by mouth 2 (two) times daily as needed for muscle spasms. 09/19/13   [name], MD   BP 159/74  Pulse 60  Temp(Src) 97.7 F (36.5 C) (Oral)  Resp 18  Ht 5\' 1"  (1.549 m)  Wt 115 lb (52.164 kg)  BMI 21.74 kg/m2  SpO2 100% Physical Exam  Nursing note and vitals  reviewed. Constitutional: She is oriented to person, place, and time. She appears well-developed and well-nourished.  HENT:  Head: Normocephalic and atraumatic.  Eyes: Conjunctivae are normal. Right eye exhibits no discharge. Left eye exhibits no discharge.  Neck: Normal range of motion. Neck supple. No tracheal deviation present.  Cardiovascular: Normal rate and regular rhythm.   Pulmonary/Chest: Effort normal and breath sounds normal.  Abdominal: Soft. She exhibits no distension. There is no tenderness. There is no guarding.  Musculoskeletal: She exhibits tenderness. She exhibits no edema.  Patient has tight trapezius and upper paraspinal thoracic tenderness. No midline cervical, thoracic or lumbar tenderness.  Neurological: She is alert and oriented to person, place, and time. GCS eye subscore is 4. GCS verbal subscore is 5. GCS motor subscore is 6.  Reflex Scores:      Patellar reflexes are 1+ on the right side and 1+ on the left side.      Achilles reflexes are 1+ on the right side and 1+ on the left side. Patient has 5+ range in upper and lower extremities bilateral at major joints. Sensation intact upper and lower extremity is equal bilateral.  Skin: Skin is warm. No rash noted.  Psychiatric: She has a normal mood and affect.    ED Course  Procedures (including critical care time) Labs Review Labs Reviewed - No data to display  Imaging Review No results found.   EKG Interpretation None      MDM   Final diagnoses:  Trapezius strain, unspecified laterality, subsequent encounter  Thoracic back pain, unspecified back pain laterality   Reviewed recent CT angina results and discussed with patient, patient has followup. Patient feels general soreness in the muscles beside the spine. Normal neuro exam the ER. Low suspicion for significant etiology at this time. Told patient likely musculoskeletal however symptoms don't improve or she gets neuro deficits she is to be evaluated  urgently.  Medicines given in ER. And prescription for home  Results and differential diagnosis were discussed with the patient/parent/guardian. Close follow up outpatient was discussed, comfortable with the plan.   Medications  orphenadrine (NORFLEX) 12 hr tablet 100 mg (not administered)  HYDROcodone-acetaminophen (NORCO/VICODIN) 5-325 MG per tablet 2 tablet (2 tablets Oral Given 09/19/13 1414)    Filed Vitals:   09/19/13 1030  BP: 159/74  Pulse: 60  Temp: 97.7 F (36.5 C)  TempSrc: Oral  Resp: 18  Height: 5\' 1"  (1.549 m)  Weight: 115 lb (52.164 kg)  SpO2: 100%         [name], MD 09/19/13 1434

## 2013-09-19 NOTE — ED Notes (Signed)
Was seen  2 days ago for same pain and was told to f/u but she has taken advil and has not helped she is still haVING [place] no new injury

## 2013-09-21 DIAGNOSIS — I6529 Occlusion and stenosis of unspecified carotid artery: Secondary | ICD-10-CM | POA: Diagnosis not present

## 2013-09-21 DIAGNOSIS — M5380 Other specified dorsopathies, site unspecified: Secondary | ICD-10-CM | POA: Diagnosis not present

## 2013-09-21 NOTE — ED Provider Notes (Signed)
Medical screening examination/treatment/procedure(s) were conducted as a shared visit with non-physician practitioner(s) and myself.  I personally evaluated the patient during the encounter.  See my additional note   EKG Interpretation   Date/Time:  Saturday September 17 2013 09:38:00 EDT Ventricular Rate:  44 PR Interval:  206 QRS Duration: 87 QT Interval:  450 QTC Calculation: 385 R Axis:   90 Text Interpretation:  Right and left arm electrode reversal,  interpretation assumes no reversal Sinus bradycardia Anterior infarct, old  No significant change was found Confirmed by Wyvonnia Dusky  MD, Nadir Vasques [phone])  on 09/17/2013 9:43:27 AM       Ezequiel Essex, MD 09/21/13 1039

## 2013-10-03 ENCOUNTER — Ambulatory Visit (INDEPENDENT_AMBULATORY_CARE_PROVIDER_SITE_OTHER): Payer: Self-pay | Admitting: General Surgery

## 2013-10-03 DIAGNOSIS — L723 Sebaceous cyst: Secondary | ICD-10-CM | POA: Diagnosis not present

## 2013-10-05 DIAGNOSIS — E618 Deficiency of other specified nutrient elements: Secondary | ICD-10-CM | POA: Diagnosis not present

## 2013-10-05 DIAGNOSIS — M5380 Other specified dorsopathies, site unspecified: Secondary | ICD-10-CM | POA: Diagnosis not present

## 2013-10-05 DIAGNOSIS — M531 Cervicobrachial syndrome: Secondary | ICD-10-CM | POA: Diagnosis not present

## 2013-10-05 DIAGNOSIS — I10 Essential (primary) hypertension: Secondary | ICD-10-CM | POA: Diagnosis not present

## 2013-10-05 DIAGNOSIS — I6529 Occlusion and stenosis of unspecified carotid artery: Secondary | ICD-10-CM | POA: Diagnosis not present

## 2013-10-11 DIAGNOSIS — M7061 Trochanteric bursitis, right hip: Secondary | ICD-10-CM | POA: Diagnosis not present

## 2013-11-07 ENCOUNTER — Encounter (HOSPITAL_COMMUNITY): Payer: Self-pay | Admitting: Emergency Medicine

## 2013-11-08 ENCOUNTER — Encounter: Payer: Self-pay | Admitting: Podiatry

## 2013-11-08 ENCOUNTER — Ambulatory Visit (INDEPENDENT_AMBULATORY_CARE_PROVIDER_SITE_OTHER): Payer: Medicare Other | Admitting: Podiatry

## 2013-11-08 DIAGNOSIS — I701 Atherosclerosis of renal artery: Secondary | ICD-10-CM | POA: Diagnosis not present

## 2013-11-08 DIAGNOSIS — B353 Tinea pedis: Secondary | ICD-10-CM | POA: Diagnosis not present

## 2013-11-08 DIAGNOSIS — Z124 Encounter for screening for malignant neoplasm of cervix: Secondary | ICD-10-CM | POA: Diagnosis not present

## 2013-11-08 NOTE — Patient Instructions (Signed)
Seen for itch spot on 5th digit left. May benefit from Vinegar soak, antifungal cream, and place cotton ball to keep the area aerated.

## 2013-11-08 NOTE — Progress Notes (Signed)
Subjective: 78 year old female presents with a concern on 5th digit left foot. Been itching a lot. She wants to know what is happening.  Objective: Itch area examined under the 4th and 5th digit left. Positive of mild erythema on skin under 5th digit without broken skin. No blister or drainage noted. No structural change noted.  Assessment: Early stage of Tinea pedis under 5th digit left.  Plan: Instructed to soak in Vinegar water. Use antifungal cream and place cotton ball spacer to aerate in between digits.  Return as needed.

## 2013-11-28 ENCOUNTER — Encounter: Payer: Self-pay | Admitting: Interventional Cardiology

## 2013-11-28 ENCOUNTER — Ambulatory Visit (INDEPENDENT_AMBULATORY_CARE_PROVIDER_SITE_OTHER): Payer: Medicare Other | Admitting: Interventional Cardiology

## 2013-11-28 VITALS — BP 150/72 | HR 58 | Ht 61.0 in | Wt 110.0 lb

## 2013-11-28 DIAGNOSIS — I701 Atherosclerosis of renal artery: Secondary | ICD-10-CM

## 2013-11-28 DIAGNOSIS — N186 End stage renal disease: Secondary | ICD-10-CM

## 2013-11-28 DIAGNOSIS — I6522 Occlusion and stenosis of left carotid artery: Secondary | ICD-10-CM | POA: Diagnosis not present

## 2013-11-28 DIAGNOSIS — I10 Essential (primary) hypertension: Secondary | ICD-10-CM

## 2013-11-28 NOTE — Patient Instructions (Addendum)
Your physician recommends that you continue on your current medications as directed. Please refer to the Current Medication list given to you today.  You have been referred to [facility]  Your physician has requested that you have a carotid duplex. This test is an ultrasound of the carotid arteries in your neck. It looks at blood flow through these arteries that supply the brain with blood. Allow one hour for this exam. There are no restrictions or special instructions.( to be scheduled in January 2016 @ [facility])  Your physician recommends that you schedule a follow-up appointment as needed

## 2013-11-28 NOTE — Progress Notes (Signed)
Patient ID: TIYE HUWE, female   DOB: [birth year], 78 y.o.   MRN: [id number]    [address], [address] [place], [place]  [address] Phone: [phone] Fax:  [phone]  Date:  11/28/2013   ID:  IA LEEB, DOB December 20, 1935, MRN [id number]  PCP:  Henrine Screws, MD   ASSESSMENT:  1. Renal artery stenosis 2. Renovascular hypertension, with marginal blood pressure control. Repeat blood pressure 140/78 mmHg 3. 60% left carotid stenosis, fibromuscular dysplasia based on CT and she performed in September  PLAN:  1. Carotid duplex Doppler study to establish appearance on Doppler and for longitudinal follow-up. This redundancy six-month's. No specific cardiac workup at this time   SUBJECTIVE: SANDIA PFUND is a 78 y.o. female who has no cardiac complaints. She did have an emergency room visit in September with head pain. The emergency room physician felt that this was related to musculoskeletal pain. Head CT was unremarkable. CT angioma demonstrated 60% left carotid stenosis. No transient neurological complaints related to the finding. She denies orthopnea, PND, syncope, angina, and claudication.   Wt Readings from Last 3 Encounters:  11/28/13 110 lb (49.896 kg)  09/19/13 115 lb (52.164 kg)  09/17/13 114 lb (51.71 kg)     Past Medical History  Diagnosis Date  . Hypertension   . Atherosclerosis of renal artery   . Fibrocystic breast     Current Outpatient Prescriptions  Medication Sig Dispense Refill  . amLODipine (NORVASC) 5 MG tablet Take 5 mg by mouth 2 (two) times daily.     Marland Kitchen aspirin 81 MG EC tablet Take 81 mg by mouth daily.      . Calcium Carbonate-Vitamin D (CALCIUM 600+D) 600-400 MG-UNIT per tablet Take 1 tablet by mouth daily.      . carvedilol (COREG) 12.5 MG tablet Take 12.5 mg by mouth 2 (two) times daily.     Marland Kitchen gabapentin (NEURONTIN) 100 MG capsule Take 100 mg by mouth daily.  3  . HYDROcodone-acetaminophen (NORCO) 5-325 MG per tablet Take 2 tablets  by mouth every 4 (four) hours as needed. 10 tablet 0  . losartan-hydrochlorothiazide (HYZAAR) 100-12.5 MG per tablet Take 1 tablet by mouth daily.      . Multiple Vitamin (MULTIVITAMIN PO) Take 1 tablet by mouth daily.     . naproxen (NAPROSYN) 375 MG tablet Take 1 tablet (375 mg total) by mouth 2 (two) times daily with a meal. PRN pain 20 tablet 0  . NITROSTAT 0.4 MG SL tablet Place 0.4 mg under the tongue every 5 (five) minutes as needed for chest pain.      No current facility-administered medications for this visit.    Allergies:    Allergies  Allergen Reactions  . Tagamet [Cimetidine] Hives    Social History:  The patient  reports that she has never smoked. She has never used smokeless tobacco. She reports that she drinks alcohol. She reports that she does not use illicit drugs.   ROS:  Please see the history of present illness.   Concerned about her husband's alcohol drinking. She denies seizure activity, weight loss, and anorexia. No orthopnea, PND, or edema.   All other systems reviewed and negative.   OBJECTIVE: VS:  BP 150/72 mmHg  Pulse 58  Ht 5\' 1"  (1.549 m)  Wt 110 lb (49.896 kg)  BMI 20.80 kg/m2 Well nourished, well developed, in no acute distress, slender and elderly HEENT: normal Neck: JVD flat. Carotid bruit absent  Cardiac:  normal  S1, S2; RRR; no murmur Lungs:  clear to auscultation bilaterally, no wheezing, rhonchi or rales Abd: soft, nontender, no hepatomegaly Ext: Edema absent. Pulses 2+ Skin: warm and dry Neuro:  CNs 2-12 intact, no focal abnormalities noted  EKG:  EKG is September   with sinus bradycardia but otherwise unremarkable    Signed, Illene Labrador III, MD 11/28/2013 10:54 AM

## 2013-12-12 DIAGNOSIS — R51 Headache: Secondary | ICD-10-CM | POA: Diagnosis not present

## 2013-12-23 ENCOUNTER — Other Ambulatory Visit: Payer: Self-pay

## 2013-12-23 DIAGNOSIS — I6522 Occlusion and stenosis of left carotid artery: Secondary | ICD-10-CM

## 2014-01-05 DIAGNOSIS — M75101 Unspecified rotator cuff tear or rupture of right shoulder, not specified as traumatic: Secondary | ICD-10-CM | POA: Diagnosis not present

## 2014-01-12 ENCOUNTER — Encounter: Payer: Self-pay | Admitting: Vascular Surgery

## 2014-01-13 ENCOUNTER — Encounter: Payer: Self-pay | Admitting: Vascular Surgery

## 2014-01-13 ENCOUNTER — Ambulatory Visit (HOSPITAL_COMMUNITY)
Admission: RE | Admit: 2014-01-13 | Discharge: 2014-01-13 | Disposition: A | Discharge status: Home / Self Care | Payer: Commercial Managed Care - HMO | Source: Ambulatory Visit | Attending: Vascular Surgery | Admitting: Vascular Surgery

## 2014-01-13 ENCOUNTER — Other Ambulatory Visit: Payer: Self-pay | Admitting: Vascular Surgery

## 2014-01-13 ENCOUNTER — Ambulatory Visit (INDEPENDENT_AMBULATORY_CARE_PROVIDER_SITE_OTHER): Payer: Commercial Managed Care - HMO | Admitting: Vascular Surgery

## 2014-01-13 VITALS — BP 155/75 | HR 61 | Ht 61.0 in | Wt 112.0 lb

## 2014-01-13 DIAGNOSIS — I6523 Occlusion and stenosis of bilateral carotid arteries: Secondary | ICD-10-CM | POA: Insufficient documentation

## 2014-01-13 DIAGNOSIS — I6522 Occlusion and stenosis of left carotid artery: Secondary | ICD-10-CM

## 2014-01-13 DIAGNOSIS — I701 Atherosclerosis of renal artery: Secondary | ICD-10-CM

## 2014-01-13 NOTE — Progress Notes (Signed)
Established Renal Artery Stenosis  History of Present Illness  Donna [name] is a 79 y.o. (11/29/1935) female who presents with chief complaint: follow up on renal artery stenosis.  Previous renal duplex completed reveals: patent R renal artery stent and L RA stenosis: >60% (stable kidney size).   The patient's blood pressure has not been stable per Dr. Tamala Julian.  He thinks she might have renovascular hypertension.  The patient's blood pressure medication regimen has remained stable.  The patient's urinary history has remained stable.  Additionally, she was referred her for surveillance for her carotid disease.  Previous carotid studies demonstrated: minimal disease R carotid with evidence of FMD in L carotid.  Patient has no history of TIA or stroke symptom.  The patient has never had amaurosis fugax or monocular blindness.  The patient has never had facial drooping or hemiplegia.  The patient has never had receptive or expressive aphasia.    The patient's PMH, PSH, SH, FamHx, Med, and Allergies are unchanged from 04/15/13.  On ROS today: no stroke sx, no renovascular sx  Physical Examination  Filed Vitals:   01/13/14 0959 01/13/14 1003  BP: 149/92 155/75  Pulse: 61   Height: 5\' 1"  (1.549 m)   Weight: 112 lb (50.803 kg)   SpO2: 100%    Body mass index is 21.17 kg/(m^2).  General: A&O x 3, WD, thin  Pulmonary: Sym exp, good air movt, CTAB, no rales, rhonchi, & wheezing  Cardiac: RRR, Nl S1, S2, no Murmurs, rubs or gallops  Vascular: Vessel Right Left  Radial Palpable Palpable  Brachial Palpable Palpable  Carotid Palpable, without bruit Palpable, without bruit  Aorta Not palpable N/A  Femoral Palpable Palpable  Popliteal Not palpable Not palpable  PT Palpable Palpable  DP Palpable Palpable   Gastrointestinal: soft, NTND, -G/R, - HSM, - masses, - CVAT B, no flank bruit  Musculoskeletal: M/S 5/5 throughout , Extremities without ischemic changes   Neurologic: Pain and  light touch intact in extremities , CN 2-12 itnact, Motor exam as listed above  Non-Invasive Vascular Imaging  Renal Duplex: ordered   CAROTID DUPLEX (Date: 01/13/2014):   R ICA stenosis: <40%  R VA: patent and antegrade  L ICA stenosis: <40%, some evidence of FMD  L VA: patent and antegrade   Medical Decision Making  Donna [name] is a 79 y.o. female who presents with: s/p R renal artery stenting, possible L RAS, asx L ICA stenosis likely due to FMD   Based on the patient's vascular studies and examination, I have offered the patient: annual carotid surveillance.  Pt will return in 2-4 weeks for B renal duplex.  I would start with the duplex.  If L renal artery findings and kidney size are stable, I doubt there is any benefit to renal angiography with intervention.    The CORAL trial greatly questions the therapeutic value of Renal artery stenting.  In general, I reserve the procedure now to recalcitrant cases of HTN strongly suggestive of renovascular HTN and renal artery salvage.  I discussed in depth with the patient the nature of atherosclerosis, and emphasized the importance of maximal medical management including strict control of blood pressure, blood glucose, and lipid levels, antiplatelet agents, obtaining regular exercise, and cessation of smoking.  The patient is aware that without maximal medical management the underlying atherosclerotic disease process will progress, limiting the benefit of any interventions.  Thank you for allowing [place] to participate in this patient's care.  Adele Barthel, MD  [facility] Office: [phone] Pager: [phone]  01/13/2014, 10:41 AM

## 2014-01-20 DIAGNOSIS — F4323 Adjustment disorder with mixed anxiety and depressed mood: Secondary | ICD-10-CM | POA: Diagnosis not present

## 2014-02-15 ENCOUNTER — Other Ambulatory Visit: Payer: Self-pay | Admitting: *Deleted

## 2014-02-15 DIAGNOSIS — I701 Atherosclerosis of renal artery: Secondary | ICD-10-CM

## 2014-02-16 ENCOUNTER — Encounter: Payer: Self-pay | Admitting: Vascular Surgery

## 2014-02-17 ENCOUNTER — Ambulatory Visit: Payer: Medicare Other | Admitting: Vascular Surgery

## 2014-02-17 ENCOUNTER — Inpatient Hospital Stay (HOSPITAL_COMMUNITY): Admission: RE | Admit: 2014-02-17 | Payer: Medicare Other | Source: Ambulatory Visit

## 2014-02-27 ENCOUNTER — Encounter (HOSPITAL_COMMUNITY): Payer: Self-pay | Admitting: *Deleted

## 2014-02-27 ENCOUNTER — Emergency Department (HOSPITAL_COMMUNITY): Payer: Commercial Managed Care - HMO

## 2014-02-27 ENCOUNTER — Emergency Department (HOSPITAL_COMMUNITY)
Admission: EM | Admit: 2014-02-27 | Discharge: 2014-02-28 | Disposition: A | Discharge status: Home / Self Care | Payer: Commercial Managed Care - HMO | Attending: Emergency Medicine | Admitting: Emergency Medicine

## 2014-02-27 DIAGNOSIS — Y998 Other external cause status: Secondary | ICD-10-CM | POA: Diagnosis not present

## 2014-02-27 DIAGNOSIS — Z79899 Other long term (current) drug therapy: Secondary | ICD-10-CM | POA: Diagnosis not present

## 2014-02-27 DIAGNOSIS — Y9389 Activity, other specified: Secondary | ICD-10-CM | POA: Diagnosis not present

## 2014-02-27 DIAGNOSIS — Y9289 Other specified places as the place of occurrence of the external cause: Secondary | ICD-10-CM | POA: Insufficient documentation

## 2014-02-27 DIAGNOSIS — I10 Essential (primary) hypertension: Secondary | ICD-10-CM | POA: Diagnosis not present

## 2014-02-27 DIAGNOSIS — Z8742 Personal history of other diseases of the female genital tract: Secondary | ICD-10-CM | POA: Diagnosis not present

## 2014-02-27 DIAGNOSIS — S56912A Strain of unspecified muscles, fascia and tendons at forearm level, left arm, initial encounter: Secondary | ICD-10-CM | POA: Diagnosis not present

## 2014-02-27 DIAGNOSIS — X58XXXA Exposure to other specified factors, initial encounter: Secondary | ICD-10-CM | POA: Diagnosis not present

## 2014-02-27 DIAGNOSIS — S59912A Unspecified injury of left forearm, initial encounter: Secondary | ICD-10-CM | POA: Diagnosis present

## 2014-02-27 DIAGNOSIS — Z7982 Long term (current) use of aspirin: Secondary | ICD-10-CM | POA: Insufficient documentation

## 2014-02-27 DIAGNOSIS — T148XXA Other injury of unspecified body region, initial encounter: Secondary | ICD-10-CM

## 2014-02-27 LAB — CBC WITH DIFFERENTIAL/PLATELET
BASOS PCT: 0 % (ref 0–1)
Basophils Absolute: 0 10*3/uL (ref 0.0–0.1)
Eosinophils Absolute: 0.1 10*3/uL (ref 0.0–0.7)
Eosinophils Relative: 2 % (ref 0–5)
HCT: 38.9 % (ref 36.0–46.0)
Hemoglobin: 13.3 g/dL (ref 12.0–15.0)
LYMPHS PCT: 29 % (ref 12–46)
Lymphs Abs: 1.4 10*3/uL (ref 0.7–4.0)
MCH: 31.2 pg (ref 26.0–34.0)
MCHC: 34.2 g/dL (ref 30.0–36.0)
MCV: 91.3 fL (ref 78.0–100.0)
MONOS PCT: 12 % (ref 3–12)
Monocytes Absolute: 0.6 10*3/uL (ref 0.1–1.0)
Neutro Abs: 2.7 10*3/uL (ref 1.7–7.7)
Neutrophils Relative %: 57 % (ref 43–77)
Platelets: 185 10*3/uL (ref 150–400)
RBC: 4.26 MIL/uL (ref 3.87–5.11)
RDW: 13.6 % (ref 11.5–15.5)
WBC: 4.7 10*3/uL (ref 4.0–10.5)

## 2014-02-27 LAB — BASIC METABOLIC PANEL
Anion gap: 8 (ref 5–15)
BUN: 25 mg/dL — AB (ref 6–23)
CALCIUM: 9.5 mg/dL (ref 8.4–10.5)
CHLORIDE: 104 mmol/L (ref 96–112)
CO2: 25 mmol/L (ref 19–32)
CREATININE: 0.87 mg/dL (ref 0.50–1.10)
GFR calc Af Amer: 73 mL/min — ABNORMAL LOW (ref >90)
GFR calc non Af Amer: 63 mL/min — ABNORMAL LOW (ref >90)
Glucose, Bld: 165 mg/dL — ABNORMAL HIGH (ref 70–99)
Potassium: 3.9 mmol/L (ref 3.5–5.1)
Sodium: 137 mmol/L (ref 135–145)

## 2014-02-27 LAB — TROPONIN I

## 2014-02-27 NOTE — ED Provider Notes (Signed)
CSN: [id number]     Arrival date & time 02/27/14  2021 History  This chart was scribe for Donna [name] K Shelbia Scinto-Rasch, MD by Judithann Sauger, ED Scribe. The patient was seen in room D31C/D31C and the patient's care was started at 11:55 PM.    Chief Complaint  Patient presents with  . Arm Pain   Patient is a 79 y.o. female presenting with extremity pain. The history is provided by the patient. No language interpreter was used.  Extremity Pain This is a new problem. The current episode started yesterday. The problem occurs constantly. The problem has not changed since onset.Pertinent negatives include no chest pain, no abdominal pain, no headaches and no shortness of breath. Nothing aggravates the symptoms. Nothing relieves the symptoms. She has tried nothing for the symptoms. The treatment provided no relief.   HPI Comments: Donna [name] is a 79 y.o. female who presents to the Emergency Department complaining of a gradually worsening L arm pain onset today. She reports that the pain started when she lifted her arm. She states that her R arm was in pain earlier too but has since resolved. She denies CP, SOB, and diaphoresis. She denies taking any medication for her pain. Her husband adds that her arm muscle does not look normal, as if it has a bulge.    Past Medical History  Diagnosis Date  . Hypertension   . Atherosclerosis of renal artery   . Fibrocystic breast    Past Surgical History  Procedure Laterality Date  . Renal artery stent  2008    Right renal artery by Dr. Drucie Opitz  . Hemorroidectomy    . Tubal ligation  1970's  . Tonsillectomy     Family History  Problem Relation Age of Onset  . Stroke Mother 68  . Aneurysm Father     abdominal aortic aneurysm  . Cancer Sister 2    breast cancer   History  Substance Use Topics  . Smoking status: Never Smoker   . Smokeless tobacco: Never Used  . Alcohol Use: No     Comment: occasional   OB History    Gravida Para Term Preterm  AB TAB SAB Ectopic Multiple Living   4 4 4  0 0 0 0 0 0 4     Review of Systems  Constitutional: Negative for diaphoresis.  Respiratory: Negative for shortness of breath.   Cardiovascular: Negative for chest pain.  Gastrointestinal: Negative for abdominal pain.  Musculoskeletal: Positive for arthralgias (L arm).  Neurological: Negative for headaches.  All other systems reviewed and are negative.     Allergies  Tagamet  Home Medications   Prior to Admission medications   Medication Sig Start Date End Date Taking? Authorizing Provider  amLODipine (NORVASC) 5 MG tablet Take 5 mg by mouth 2 (two) times daily.    Yes Historical Provider, MD  aspirin 81 MG EC tablet Take 81 mg by mouth daily.     Yes Historical Provider, MD  carvedilol (COREG) 12.5 MG tablet Take 12.5 mg by mouth 2 (two) times daily.  04/21/12  Yes Historical Provider, MD  gabapentin (NEURONTIN) 100 MG capsule Take 100 mg by mouth daily. 08/27/13  Yes Historical Provider, MD  losartan-hydrochlorothiazide (HYZAAR) 100-12.5 MG per tablet Take 1 tablet by mouth daily.     Yes Historical Provider, MD  Multiple Vitamin (MULTIVITAMIN PO) Take 1 tablet by mouth daily.    Yes Historical Provider, MD  NITROSTAT 0.4 MG SL tablet Place 0.4 mg under  the tongue every 5 (five) minutes as needed for chest pain.    Yes Historical Provider, MD  HYDROcodone-acetaminophen (NORCO) 5-325 MG per tablet Take 2 tablets by mouth every 4 (four) hours as needed. Patient not taking: Reported on 01/13/2014 09/19/13   [name], MD  naproxen (NAPROSYN) 375 MG tablet Take 1 tablet (375 mg total) by mouth 2 (two) times daily with a meal. PRN pain Patient not taking: Reported on 02/27/2014 09/17/13   [name], PA-C   BP 145/79 mmHg  Pulse 64  Temp(Src) 98.3 F (36.8 C)  Resp 17  SpO2 100% Physical Exam  Constitutional: She is oriented to person, place, and time. She appears well-developed and well-nourished. No distress.  HENT:  Head:  Normocephalic and atraumatic.  Mouth/Throat: Oropharynx is clear and moist.  Eyes: Conjunctivae and EOM are normal. Pupils are equal, round, and reactive to light.  Neck: Normal range of motion. Neck supple. No tracheal deviation present.  Cardiovascular: Normal rate, regular rhythm and normal heart sounds.   Pulmonary/Chest: Effort normal and breath sounds normal. No respiratory distress. She has no wheezes. She has no rales.  Abdominal: Soft. Bowel sounds are normal. There is no tenderness. There is no rebound and no guarding.  Musculoskeletal: Normal range of motion. She exhibits no edema.       Left elbow: Normal.       Left wrist: Normal.       Left upper arm: She exhibits no bony tenderness, no swelling, no edema, no deformity and no laceration.       Left hand: She exhibits normal range of motion, no bony tenderness, normal two-point discrimination and normal capillary refill. Normal sensation noted.  Bicep tendons is intact, both tendons. No snuffbox, Cap refill in left fingers less than 2 seconds. 5/5 strength   Neurological: She is alert and oriented to person, place, and time.  Skin: Skin is warm and dry.  Psychiatric: She has a normal mood and affect. Her behavior is normal.  Nursing note and vitals reviewed.   ED Course  Procedures (including critical care time) DIAGNOSTIC STUDIES: Oxygen Saturation is 100% on RA, normal by my interpretation.    COORDINATION OF CARE: 11:59 PM- Pt advised of plan for treatment and pt agrees.    Labs Review Labs Reviewed  BASIC METABOLIC PANEL - Abnormal; Notable for the following:    Glucose, Bld 165 (*)    BUN 25 (*)    GFR calc non Af Amer 63 (*)    GFR calc Af Amer 73 (*)    All other components within normal limits  CBC WITH DIFFERENTIAL/PLATELET  TROPONIN I  TROPONIN I    Imaging Review Dg Chest 2 View  02/27/2014   CLINICAL DATA:  Left arm pain and numbness this afternoon. Weakness in the left arm began this evening.  EXAM:  CHEST  2 VIEW  COMPARISON:  09/17/2013  FINDINGS: Atherosclerotic calcification of the aortic arch. Mild cardiomegaly, cardiothoracic index 56%. No edema. Dextroconvex lower thoracic scoliosis. Levoconvex lumbar scoliosis.  The lungs appear clear.  No pleural effusion.  IMPRESSION: 1. Mild enlargement of the cardiopericardial silhouette, without edema. 2. Lower thoracic and lumbar scoliosis.   Electronically Signed   By: Van Clines M.D.   On: 02/27/2014 21:31     EKG Interpretation   Date/Time:  Monday February 27 2014 20:41:04 EST Ventricular Rate:  64 PR Interval:  200 QRS Duration: 74 QT Interval:  396 QTC Calculation: 408 R Axis:  88 Text Interpretation:  Sinus rhythm with occasional Premature ventricular  complexes Low voltage QRS Septal infarct , age undetermined Abnormal ECG  When compared with ECG of 09/17/2013, Premature ventricular complexes are  now Present Confirmed by [facility]  MD, DAVID ([id number]) on 02/27/2014 8:46:21 PM      MDM   Final diagnoses:  None    MUSCLE STRAIN.  NSAIDS ICE ELEVATION AND CLOSE FOLLOW UP DOUBT ACS.  RULED OUT WITH ENZYMES AND TROPONINS. BICEPS TENDONS ARE INTACT I personally performed the services described in this documentation, which was scribed in my presence. The recorded information has been reviewed and is accurate.     Carlisle Beers, MD 02/28/14 [id number]

## 2014-02-27 NOTE — ED Notes (Addendum)
Patient presents stating she started with left arm pain (was in the garage supervising someone telling them where to put things and not lifting anything) and then it eased up but the right arm started hurting.  Denies CP, SOB, sweating, etc  First BP was 170/134

## 2014-02-28 ENCOUNTER — Encounter (HOSPITAL_COMMUNITY): Payer: Self-pay | Admitting: Emergency Medicine

## 2014-02-28 DIAGNOSIS — S56912A Strain of unspecified muscles, fascia and tendons at forearm level, left arm, initial encounter: Secondary | ICD-10-CM | POA: Diagnosis not present

## 2014-02-28 LAB — TROPONIN I: Troponin I: <0.03 ng/mL (ref <0.031)

## 2014-02-28 MED ORDER — METHOCARBAMOL 500 MG PO TABS: 500.0000 mg | ORAL_TABLET | Freq: Two times a day (BID) | ORAL | Status: DC

## 2014-02-28 MED ORDER — NAPROXEN 375 MG PO TABS: 375.0000 mg | ORAL_TABLET | Freq: Two times a day (BID) | ORAL | Status: DC

## 2014-02-28 MED ORDER — KETOROLAC TROMETHAMINE 60 MG/2ML IM SOLN
60.0000 mg | Freq: Once | INTRAMUSCULAR | Status: AC
  Administered 2014-02-28: 60 mg via INTRAMUSCULAR
  Filled 2014-02-28: qty 2

## 2014-02-28 MED ORDER — METHOCARBAMOL 500 MG PO TABS
1000.0000 mg | ORAL_TABLET | Freq: Once | ORAL | Status: AC
  Administered 2014-02-28: 1000 mg via ORAL
  Filled 2014-02-28: qty 2

## 2014-02-28 NOTE — Discharge Instructions (Signed)

## 2014-04-14 ENCOUNTER — Ambulatory Visit: Payer: Medicare Other | Admitting: Family

## 2014-04-14 ENCOUNTER — Other Ambulatory Visit (HOSPITAL_COMMUNITY): Payer: Medicare Other

## 2014-04-20 ENCOUNTER — Encounter: Payer: Self-pay | Admitting: Vascular Surgery

## 2014-04-21 ENCOUNTER — Ambulatory Visit (HOSPITAL_COMMUNITY)
Admission: RE | Admit: 2014-04-21 | Discharge: 2014-04-21 | Disposition: A | Discharge status: Home / Self Care | Payer: Commercial Managed Care - HMO | Source: Ambulatory Visit | Attending: Vascular Surgery | Admitting: Vascular Surgery

## 2014-04-21 ENCOUNTER — Encounter: Payer: Self-pay | Admitting: Vascular Surgery

## 2014-04-21 ENCOUNTER — Ambulatory Visit (INDEPENDENT_AMBULATORY_CARE_PROVIDER_SITE_OTHER): Payer: Commercial Managed Care - HMO | Admitting: Vascular Surgery

## 2014-04-21 VITALS — BP 140/75 | HR 60 | Ht 61.0 in | Wt 115.7 lb

## 2014-04-21 DIAGNOSIS — I6522 Occlusion and stenosis of left carotid artery: Secondary | ICD-10-CM

## 2014-04-21 DIAGNOSIS — I701 Atherosclerosis of renal artery: Secondary | ICD-10-CM | POA: Diagnosis present

## 2014-04-21 NOTE — Addendum Note (Signed)
Addended by: Dorthula Rue L on: 04/21/2014 04:32 PM   Modules accepted: Orders

## 2014-04-21 NOTE — Progress Notes (Signed)
    Established Renal Artery Stenosis  History of Present Illness  Donna [name] is a 79 y.o. ([birth year]) female who presents with chief complaint: stable BP.  Pt has been seen for prior R RA stenting and possible renovascular HTN.  Her BP regimen is stable at this point.  She has been on three med at a time.  The patient's urinary history has remained stable.  In regards to possible prior CT suggests of FMD, she continues to be complete asx in regards to carotid disease.  The patient's PMH, PSH, SH, FamHx, Med, and Allergies are unchanged from 01/13/14.  On ROS today: no CVA or TIA sx, no change in anti-HTN regimen   Physical Examination  Filed Vitals:   04/21/14 1047 04/21/14 1052  BP: 145/59 140/75  Pulse: 55 60  Height: 5\' 1"  (1.549 m)   Weight: 115 lb 11.2 oz (52.481 kg)   SpO2: 100%    Body mass index is 21.87 kg/(m^2).  General: A&O x 3, WDWN  Pulmonary: Sym exp, good air movt, CTAB, no rales, rhonchi, & wheezing  Cardiac: RRR, Nl S1, S2, no Murmurs, rubs or gallops  Vascular: Vessel Right Left  Radial Palpable Palpable  Brachial Palpable Palpable  Carotid Palpable, without bruit Palpable, without bruit  Aorta Not palpable N/A  Femoral Palpable Palpable  Popliteal Not palpable Not palpable  PT Palpable Palpable  DP Palpable Palpable   Gastrointestinal: soft, NTND, -G/R, - HSM, - masses, - CVAT B  Musculoskeletal: M/S 5/5 throughout , Extremities without ischemic changes   Neurologic: Pain and light touch intact in extremities , Motor exam as listed above  Non-Invasive Vascular Imaging  Renal Duplex (Date: 04/21/2014 )  R kidney size: 9.4 cm  L kidney size: 10.4 cm  R RA stenosis: patent RA stent  L RA stenosis: <60% (209 c/s PSV)  Medical Decision Making  Donna [name] is a 79 y.o. female who presents with: s/p R renal artery stenting (2008), <60% L RA stenosis   Based on the patient's vascular studies and examination, I have offered the patient:  annual renal artery duplex, q2 years carotid duplex  I discussed in depth with the patient the nature of atherosclerosis, and emphasized the importance of maximal medical management including strict control of blood pressure, blood glucose, and lipid levels, antiplatelet agents, obtaining regular exercise, and cessation of smoking.  The patient is aware that without maximal medical management the underlying atherosclerotic disease process will progress, limiting the benefit of any interventions. The patient is currently not on a statin: as not medically indicated. The patient is currently on an anti-platelet: ASA.  Thank you for allowing [place] to participate in this patient's care.  Adele Barthel, MD [facility] Office: [phone] Pager: [phone]  04/21/2014, 2:35 PM

## 2014-04-26 ENCOUNTER — Telehealth: Payer: Self-pay | Admitting: Interventional Cardiology

## 2014-04-26 NOTE — Telephone Encounter (Signed)
New message     Pt saw Dr Bridgett Larsson on 04-21-14.  He asked her why she did not have a presc for nitro.  She said she did not know.  Please call

## 2014-04-27 NOTE — Telephone Encounter (Signed)
Returned pt call. Lmom. A message will be fwd to Dr.Smith for an Rx approval for Nitro-glycerin

## 2014-04-27 NOTE — Telephone Encounter (Signed)
Called to give pt Dr.Smith's response. Unable to lmom pt phone rings out

## 2014-04-27 NOTE — Telephone Encounter (Signed)
She has never had an indication for nitroglycerin. She does not have coronary artery disease or angina unless it is a recent development.

## 2014-07-25 ENCOUNTER — Emergency Department (HOSPITAL_COMMUNITY): Payer: Commercial Managed Care - HMO

## 2014-07-25 ENCOUNTER — Encounter (HOSPITAL_COMMUNITY): Payer: Self-pay | Admitting: Physical Medicine and Rehabilitation

## 2014-07-25 ENCOUNTER — Emergency Department (HOSPITAL_COMMUNITY)
Admission: EM | Admit: 2014-07-25 | Discharge: 2014-07-26 | Disposition: A | Discharge status: Home / Self Care | Payer: Commercial Managed Care - HMO | Attending: Emergency Medicine | Admitting: Emergency Medicine

## 2014-07-25 DIAGNOSIS — Z8742 Personal history of other diseases of the female genital tract: Secondary | ICD-10-CM | POA: Insufficient documentation

## 2014-07-25 DIAGNOSIS — I10 Essential (primary) hypertension: Secondary | ICD-10-CM | POA: Diagnosis not present

## 2014-07-25 DIAGNOSIS — Z79899 Other long term (current) drug therapy: Secondary | ICD-10-CM | POA: Diagnosis not present

## 2014-07-25 DIAGNOSIS — Z7982 Long term (current) use of aspirin: Secondary | ICD-10-CM | POA: Diagnosis not present

## 2014-07-25 DIAGNOSIS — R079 Chest pain, unspecified: Secondary | ICD-10-CM | POA: Diagnosis present

## 2014-07-25 DIAGNOSIS — I4891 Unspecified atrial fibrillation: Secondary | ICD-10-CM | POA: Diagnosis not present

## 2014-07-25 LAB — COMPREHENSIVE METABOLIC PANEL
ALT: 48 U/L (ref 14–54)
AST: 46 U/L — ABNORMAL HIGH (ref 15–41)
Albumin: 3.8 g/dL (ref 3.5–5.0)
Alkaline Phosphatase: 41 U/L (ref 38–126)
Anion gap: 10 (ref 5–15)
BUN: 26 mg/dL — ABNORMAL HIGH (ref 6–20)
CALCIUM: 9.6 mg/dL (ref 8.9–10.3)
CO2: 20 mmol/L — ABNORMAL LOW (ref 22–32)
CREATININE: 1.05 mg/dL — AB (ref 0.44–1.00)
Chloride: 108 mmol/L (ref 101–111)
GFR calc Af Amer: 58 mL/min — ABNORMAL LOW (ref >60)
GFR calc non Af Amer: 50 mL/min — ABNORMAL LOW (ref >60)
GLUCOSE: 107 mg/dL — AB (ref 65–99)
Potassium: 4.3 mmol/L (ref 3.5–5.1)
Sodium: 138 mmol/L (ref 135–145)
TOTAL PROTEIN: 6.5 g/dL (ref 6.5–8.1)
Total Bilirubin: 1.7 mg/dL — ABNORMAL HIGH (ref 0.3–1.2)

## 2014-07-25 LAB — CBC WITH DIFFERENTIAL/PLATELET
Basophils Absolute: 0 10*3/uL (ref 0.0–0.1)
Basophils Relative: 1 % (ref 0–1)
Eosinophils Absolute: 0 10*3/uL (ref 0.0–0.7)
Eosinophils Relative: 1 % (ref 0–5)
HCT: 41.5 % (ref 36.0–46.0)
Hemoglobin: 14.2 g/dL (ref 12.0–15.0)
LYMPHS PCT: 31 % (ref 12–46)
Lymphs Abs: 1.2 10*3/uL (ref 0.7–4.0)
MCH: 32.3 pg (ref 26.0–34.0)
MCHC: 34.2 g/dL (ref 30.0–36.0)
MCV: 94.5 fL (ref 78.0–100.0)
MONO ABS: 0.4 10*3/uL (ref 0.1–1.0)
MONOS PCT: 9 % (ref 3–12)
Neutro Abs: 2.4 10*3/uL (ref 1.7–7.7)
Neutrophils Relative %: 58 % (ref 43–77)
Platelets: 138 10*3/uL — ABNORMAL LOW (ref 150–400)
RBC: 4.39 MIL/uL (ref 3.87–5.11)
RDW: 13.6 % (ref 11.5–15.5)
WBC: 4 10*3/uL (ref 4.0–10.5)

## 2014-07-25 LAB — I-STAT TROPONIN, ED
Troponin i, poc: 0.01 ng/mL (ref 0.00–0.08)
Troponin i, poc: 0 ng/mL (ref 0.00–0.08)

## 2014-07-25 MED ORDER — CARVEDILOL 12.5 MG PO TABS
12.5000 mg | ORAL_TABLET | Freq: Once | ORAL | Status: AC
  Administered 2014-07-25: 12.5 mg via ORAL
  Filled 2014-07-25: qty 1

## 2014-07-25 MED ORDER — ASPIRIN 81 MG PO CHEW
324.0000 mg | CHEWABLE_TABLET | Freq: Once | ORAL | Status: AC
  Administered 2014-07-25: 324 mg via ORAL
  Filled 2014-07-25: qty 4

## 2014-07-25 NOTE — ED Notes (Signed)
Pt presents to department for evaluation of midsternal non radiating chest pain. Onset today. 6/10 pain at the time. Respirations unlabored. Pt is alert and oriented x4.

## 2014-07-25 NOTE — ED Notes (Signed)
Heart Healthy diet ordered per Dr. Rogene Houston.  [place] sandwich and gingerale provided to family

## 2014-07-26 DIAGNOSIS — I4891 Unspecified atrial fibrillation: Secondary | ICD-10-CM | POA: Diagnosis not present

## 2014-07-26 MED ORDER — RIVAROXABAN 15 MG PO TABS
15.0000 mg | ORAL_TABLET | Freq: Once | ORAL | Status: AC
  Administered 2014-07-26: 15 mg via ORAL
  Filled 2014-07-26: qty 1

## 2014-07-26 MED ORDER — RIVAROXABAN 15 MG PO TABS: 15.0000 mg | ORAL_TABLET | Freq: Every day | ORAL | Status: DC

## 2014-07-26 NOTE — Discharge Instructions (Signed)
Atrial Fibrillation Atrial fibrillation is a condition that causes your heart to beat irregularly. It may also cause your heart to beat faster than normal. Atrial fibrillation can prevent your heart from pumping blood normally. It increases your risk of stroke and heart problems. HOME CARE  Take medications as told by your doctor.  Only take medications that your doctor says are safe. Some medications can make the condition worse or happen again.  If blood thinners were prescribed by your doctor, take them exactly as told. Too much can cause bleeding. Too little and you will not have the needed protection against stroke and other problems.  Perform blood tests at home if told by your doctor.  Perform blood tests exactly as told by your doctor.  Do not drink alcohol.  Do not drink beverages with caffeine such as coffee, soda, and some teas.  Maintain a healthy weight.  Do not use diet pills unless your doctor says they are safe. They may make heart problems worse.  Follow diet instructions as told by your doctor.  Exercise regularly as told by your doctor.  Keep all follow-up appointments. GET HELP IF:  You notice a change in the speed, rhythm, or strength of your heartbeat.  You suddenly begin peeing (urinating) more often.  You get tired more easily when moving or exercising. GET HELP RIGHT AWAY IF:   You have chest or belly (abdominal) pain.  You feel sick to your stomach (nauseous).  You are short of breath.  You suddenly have swollen feet and ankles.  You feel dizzy.  You face, arms, or legs feel numb or weak.  There is a change in your vision or speech. MAKE SURE YOU:   Understand these instructions.  Will watch your condition.  Will get help right away if you are not doing well or get worse. Document Released: 10/02/2007 Document Revised: 05/09/2013 Document Reviewed: 02/03/2012 [facility] Patient Information 2015 [place], [place]. This information is not  intended to replace advice given to you by your health care provider. Make sure you discuss any questions you have with your health care provider.  Take the blood thinner as directed once a day. Call your cardiologist tomorrow for follow-up. Continue your Coreg as directed. Return for any new or worse symptoms. Return for any recurrent chest pain lasting 15 or 20 minutes or longer.

## 2014-07-26 NOTE — ED Notes (Signed)
EDP at bedside  

## 2014-07-26 NOTE — ED Provider Notes (Addendum)
CSN: [id number]     Arrival date & time 07/25/14  1330 History   First MD Initiated Contact with Patient 07/25/14 1728     Chief Complaint  Patient presents with  . Chest Pain     (Consider location/radiation/quality/duration/timing/severity/associated sxs/prior Treatment) The history is provided by the patient and the spouse.   79 year old female onset of left-sided chest pain at 11:00 this morning that continued until 2:00 this afternoon and then resolved. Described as an ache. No shortness of breath no nausea vomiting patient's followed by Daneen Schick from cardiology. Patient did not take her aspirin today. Patient did take her blood pressure medicines this morning. Pain is nonradiating. Pain described as the 6 out of 10.  Past Medical History  Diagnosis Date  . Hypertension   . Atherosclerosis of renal artery   . Fibrocystic breast    Past Surgical History  Procedure Laterality Date  . Renal artery stent  2008    Right renal artery by Dr. Drucie Opitz  . Hemorroidectomy    . Tubal ligation  1970's  . Tonsillectomy     Family History  Problem Relation Age of Onset  . Stroke Mother 22  . Diabetes Mother   . Hypertension Mother   . Aneurysm Father     abdominal aortic aneurysm  . Diabetes Father   . Hypertension Father   . Cancer Sister 40    breast cancer   History  Substance Use Topics  . Smoking status: Never Smoker   . Smokeless tobacco: Never Used  . Alcohol Use: No   OB History    Gravida Para Term Preterm AB TAB SAB Ectopic Multiple Living   4 4 4  0 0 0 0 0 0 4     Review of Systems  Constitutional: Negative for fever.  HENT: Negative for congestion.   Eyes: Negative for visual disturbance.  Respiratory: Negative for shortness of breath.   Cardiovascular: Positive for chest pain. Negative for palpitations and leg swelling.  Gastrointestinal: Negative for abdominal pain.  Genitourinary: Negative for dysuria.  Musculoskeletal: Negative for back pain.   Skin: Negative for rash.  Neurological: Negative for dizziness and light-headedness.  Psychiatric/Behavioral: Negative for confusion.      Allergies  Tagamet  Home Medications   Prior to Admission medications   Medication Sig Start Date End Date Taking? Authorizing Provider  amLODipine (NORVASC) 5 MG tablet Take 5 mg by mouth 2 (two) times daily.    Yes Historical Provider, MD  aspirin 81 MG EC tablet Take 81 mg by mouth daily.     Yes Historical Provider, MD  carvedilol (COREG) 12.5 MG tablet Take 12.5 mg by mouth 2 (two) times daily.  04/21/12  Yes Historical Provider, MD  gabapentin (NEURONTIN) 100 MG capsule Take 100 mg by mouth daily. 08/27/13  Yes Historical Provider, MD  losartan-hydrochlorothiazide (HYZAAR) 100-12.5 MG per tablet Take 1 tablet by mouth daily.     Yes Historical Provider, MD  methocarbamol (ROBAXIN) 500 MG tablet Take 1 tablet (500 mg total) by mouth 2 (two) times daily. 02/28/14  Yes April Palumbo, MD  Multiple Vitamin (MULTIVITAMIN PO) Take 1 tablet by mouth daily.    Yes Historical Provider, MD  HYDROcodone-acetaminophen (NORCO) 5-325 MG per tablet Take 2 tablets by mouth every 4 (four) hours as needed. Patient not taking: Reported on 04/21/2014 09/19/13   Elnora Morrison, MD  naproxen (NAPROSYN) 375 MG tablet Take 1 tablet (375 mg total) by mouth 2 (two) times daily. Patient not  taking: Reported on 07/25/2014 02/28/14   April Palumbo, MD  NITROSTAT 0.4 MG SL tablet Place 0.4 mg under the tongue every 5 (five) minutes as needed for chest pain.     Historical Provider, MD  Rivaroxaban (XARELTO) 15 MG TABS tablet Take 1 tablet (15 mg total) by mouth daily with supper. 07/26/14   Fredia Sorrow, MD   BP 116/76 mmHg  Pulse 52  Temp(Src) 97.7 F (36.5 C) (Oral)  Resp 19  SpO2 98% Physical Exam  Constitutional: She is oriented to person, place, and time. She appears well-developed and well-nourished. No distress.  HENT:  Head: Normocephalic and atraumatic.   Mouth/Throat: Oropharynx is clear and moist.  Eyes: Conjunctivae and EOM are normal. Pupils are equal, round, and reactive to light.  Neck: Normal range of motion.  Cardiovascular: Normal rate.   No murmur heard. Slightly tachycardic at times and irregular  Pulmonary/Chest: Effort normal and breath sounds normal. No respiratory distress.  Abdominal: Soft. Bowel sounds are normal.  Musculoskeletal: Normal range of motion. She exhibits no edema.  Neurological: She is alert and oriented to person, place, and time. No cranial nerve deficit. She exhibits normal muscle tone. Coordination normal.  Skin: Skin is warm.  Nursing note and vitals reviewed.   ED Course  Procedures (including critical care time) Labs Review Labs Reviewed  CBC WITH DIFFERENTIAL/PLATELET - Abnormal; Notable for the following:    Platelets 138 (*)    All other components within normal limits  COMPREHENSIVE METABOLIC PANEL - Abnormal; Notable for the following:    CO2 20 (*)    Glucose, Bld 107 (*)    BUN 26 (*)    Creatinine, Ser 1.05 (*)    AST 46 (*)    Total Bilirubin 1.7 (*)    GFR calc non Af Amer 50 (*)    GFR calc Af Amer 58 (*)    All other components within normal limits  I-STAT TROPOININ, ED  I-STAT TROPOININ, ED   Results for orders placed or performed during the hospital encounter of 07/25/14  CBC with Differential  Result Value Ref Range   WBC 4.0 4.0 - 10.5 K/uL   RBC 4.39 3.87 - 5.11 MIL/uL   Hemoglobin 14.2 12.0 - 15.0 g/dL   HCT 41.5 36.0 - 46.0 %   MCV 94.5 78.0 - 100.0 fL   MCH 32.3 26.0 - 34.0 pg   MCHC 34.2 30.0 - 36.0 g/dL   RDW 13.6 11.5 - 15.5 %   Platelets 138 (L) 150 - 400 K/uL   Neutrophils Relative % 58 43 - 77 %   Lymphocytes Relative 31 12 - 46 %   Monocytes Relative 9 3 - 12 %   Eosinophils Relative 1 0 - 5 %   Basophils Relative 1 0 - 1 %   Neutro Abs 2.4 1.7 - 7.7 K/uL   Lymphs Abs 1.2 0.7 - 4.0 K/uL   Monocytes Absolute 0.4 0.1 - 1.0 K/uL   Eosinophils Absolute  0.0 0.0 - 0.7 K/uL   Basophils Absolute 0.0 0.0 - 0.1 K/uL   Smear Review MORPHOLOGY UNREMARKABLE   Comprehensive metabolic panel  Result Value Ref Range   Sodium 138 135 - 145 mmol/L   Potassium 4.3 3.5 - 5.1 mmol/L   Chloride 108 101 - 111 mmol/L   CO2 20 (L) 22 - 32 mmol/L   Glucose, Bld 107 (H) 65 - 99 mg/dL   BUN 26 (H) 6 - 20 mg/dL   Creatinine, Ser 1.05 (  H) 0.44 - 1.00 mg/dL   Calcium 9.6 8.9 - 10.3 mg/dL   Total Protein 6.5 6.5 - 8.1 g/dL   Albumin 3.8 3.5 - 5.0 g/dL   AST 46 (H) 15 - 41 U/L   ALT 48 14 - 54 U/L   Alkaline Phosphatase 41 38 - 126 U/L   Total Bilirubin 1.7 (H) 0.3 - 1.2 mg/dL   GFR calc non Af Amer 50 (L) >60 mL/min   GFR calc Af Amer 58 (L) >60 mL/min   Anion gap 10 5 - 15  I-Stat Troponin, ED (not at [facility])  Result Value Ref Range   Troponin i, poc 0.01 0.00 - 0.08 ng/mL   Comment 3          I-Stat Troponin, ED (not at [facility])  Result Value Ref Range   Troponin i, poc 0.00 0.00 - 0.08 ng/mL   Comment 3             Imaging Review Dg Chest 2 View  07/25/2014   CLINICAL DATA:  Chest pain for 1 day.  EXAM: CHEST  2 VIEW  COMPARISON:  Chest x-ray dated 02/27/2014.  FINDINGS: Mild cardiomegaly is unchanged. Mild atherosclerotic change of the grossly normal-caliber thoracic aortic arch is again seen. Overall cardiomediastinal silhouette is stable. Lungs are clear. No pleural effusion. No pneumothorax.  No acute osseous abnormality seen. Dystrophic calcifications underlying the right glenohumeral joint space are likely sequela of chronic calcific tendinitis or old labral injury. Scoliosis of the thoracolumbar spine is unchanged.  IMPRESSION: Mild cardiomegaly is stable.  No evidence of acute cardiopulmonary abnormality. Lungs are clear. No evidence of pneumonia. No evidence of congestive heart failure.   Electronically Signed   By: Franki Cabot M.D.   On: 07/25/2014 14:45     EKG Interpretation None      MDM   Final diagnoses:  Atrial fibrillation, new  onset     Patient wouldn't patient with new onset atrial fib. Initially with a little bit of rate control running from 100-120 but patient was given her evening dose of Coreg which brought the heart rate down but stayed in atrial fib. Discussed with cardiology they recommend the [facility], as a blood thinner. Based on the patient's renal function discussed with pharmacist and they recommended going with 15 mg dose. Patient is followed by Daneen Schick from cardiology and she's got a call for follow-up in the do the rest as an outpatient workup. Patient originally came for chest pain but chest pain resolved at about 2:00 in the afternoon repeat troponin 6 hours later was negative so patient cleared from an acute coronary syndrome problem and does not need admission. EKG without any acute changes other than the atrial fib. Chest x-ray also negative.       Fredia Sorrow, MD 07/26/14 0023  Fredia Sorrow, MD 07/26/14 [phone]

## 2014-07-30 DIAGNOSIS — I4891 Unspecified atrial fibrillation: Secondary | ICD-10-CM

## 2014-07-31 ENCOUNTER — Telehealth: Payer: Self-pay

## 2014-07-31 DIAGNOSIS — I4891 Unspecified atrial fibrillation: Secondary | ICD-10-CM

## 2014-07-31 NOTE — Telephone Encounter (Signed)
-----   Message from Belva Crome, MD sent at 07/30/2014 12:13 PM EDT ----- Regarding: A fib with recent Xarelto start on 07/26/14. Have her discontinue aspirin and Naprosyn, due to risk of bleeding. She needs and echocardiogram, and after echo, an OV with me within next 7 days, unless symptoms (then have seen by DOD/PA ).

## 2014-07-31 NOTE — Telephone Encounter (Signed)
Called to give pt Dr.Smith's recommendation below. [email or web address] 

## 2014-08-01 NOTE — Telephone Encounter (Signed)
Pt aware of Dr.Smith recommendations below. D/c Aspirin and NSAIDS. Adv her that a scheduler form our office will call her to schedule her echo. Pt has post hp f/u already scheduled with Antony Salmon for 8/3 Pt would like to have echo scheduled the same day as her appt.

## 2014-08-09 ENCOUNTER — Ambulatory Visit (INDEPENDENT_AMBULATORY_CARE_PROVIDER_SITE_OTHER): Payer: Commercial Managed Care - HMO | Admitting: Nurse Practitioner

## 2014-08-09 ENCOUNTER — Ambulatory Visit (HOSPITAL_COMMUNITY): Payer: Commercial Managed Care - HMO | Attending: Cardiology

## 2014-08-09 ENCOUNTER — Encounter: Payer: Self-pay | Admitting: Nurse Practitioner

## 2014-08-09 ENCOUNTER — Other Ambulatory Visit: Payer: Self-pay

## 2014-08-09 VITALS — BP 130/80 | HR 102 | Ht 61.0 in | Wt 111.0 lb

## 2014-08-09 DIAGNOSIS — I10 Essential (primary) hypertension: Secondary | ICD-10-CM

## 2014-08-09 DIAGNOSIS — I4891 Unspecified atrial fibrillation: Secondary | ICD-10-CM

## 2014-08-09 DIAGNOSIS — I739 Peripheral vascular disease, unspecified: Secondary | ICD-10-CM

## 2014-08-09 DIAGNOSIS — I313 Pericardial effusion (noninflammatory): Secondary | ICD-10-CM | POA: Insufficient documentation

## 2014-08-09 DIAGNOSIS — I340 Nonrheumatic mitral (valve) insufficiency: Secondary | ICD-10-CM | POA: Insufficient documentation

## 2014-08-09 DIAGNOSIS — I071 Rheumatic tricuspid insufficiency: Secondary | ICD-10-CM | POA: Diagnosis not present

## 2014-08-09 DIAGNOSIS — I773 Arterial fibromuscular dysplasia: Secondary | ICD-10-CM | POA: Diagnosis not present

## 2014-08-09 DIAGNOSIS — I779 Disorder of arteries and arterioles, unspecified: Secondary | ICD-10-CM | POA: Insufficient documentation

## 2014-08-09 LAB — TSH: TSH: 0.58 u[IU]/mL (ref 0.35–4.50)

## 2014-08-09 MED ORDER — CARVEDILOL 12.5 MG PO TABS: 18.7500 mg | ORAL_TABLET | Freq: Two times a day (BID) | ORAL | Status: DC

## 2014-08-09 MED ORDER — APIXABAN 5 MG PO TABS: 5.0000 mg | ORAL_TABLET | Freq: Two times a day (BID) | ORAL | Status: DC

## 2014-08-09 NOTE — Patient Instructions (Signed)
Medication Instructions:  Your physician has recommended you make the following change in your medication:  1)STOP Xarelto 2)START Eliquis 5mg  daily. An Rx has been sent to your pharmacy 3)INCREASE Carvedilol to 18.75mg  (1 and 1/2 tablet) Twice daily   Labwork: Tsh today  Testing/Procedures: None   Follow-Up: Your physician recommends that you schedule a follow-up appointment in: 4 weeks with an APP a day that Dr.Smith is in the office  Any Other Special Instructions Will Be Listed Below (If Applicable).

## 2014-08-09 NOTE — Progress Notes (Signed)
Patient Name: Donna [name] Date of Encounter: 08/09/2014  Primary Care Provider:  Henrine Screws, MD Primary Cardiologist:  Linard Millers, MD   Chief Complaint  79 year old female with a history of hypertension, renal artery stenosis, and carotid arterial disease, who presents today secondary to a new diagnosis of atrial fibrillation/flutter.  Past Medical History   Past Medical History  Diagnosis Date  . Essential hypertension   . Atherosclerosis of renal artery     a. s/p R RA stenting;  b. 04/2014 Renal Art duplex: Patent R RA stent, <60% L RA stenosis.  . Fibrocystic breast   . Fibromuscular dysplasia   . Carotid arterial disease     a. 01/2014 Carotid U/S: RICA 37%, LICA 62%.  . Atrial Fibrillation/Flutter     a. Dx 07/26/2014, CHA2DS2VASc = 5-->Eliquis;  c. 08/2014 Echo: EF 55-60%, no rwma, mildly dil LA/RA, mod-sev TR, PASP 68mmHg.   Past Surgical History  Procedure Laterality Date  . Renal artery stent  2008    Right renal artery by Dr. Drucie Opitz  . Hemorroidectomy    . Tubal ligation  1970's  . Tonsillectomy      Allergies  Allergies  Allergen Reactions  . Tagamet [Cimetidine] Hives    HPI  79 year old female with the above, promised. She does have a history of hypertension, renal artery stenosis status post stenting, and carotid arterial disease. On July 19, she was evaluated in the [facility] emergency department secondary to complaints of palpitations and chest discomfort. Upon arrival, she was noted to be in atrial fibrillation with relative rate control in the low 100s. Her troponins were normal. Her case was apparently discussed with cardiology and recommendation was made to start xarelto and continue beta blocker therapy with outpatient follow-up. Patient says that she does not think that she ever took the xarelto. Since discharge from the ER, she has felt fine without any recurrence of palpitations, chest discomfort, or dyspnea. Further, she denies PND,  orthopnea, dizziness, sick B, edema, or early satiety. She had an echo this morning that shows normal LV function with moderate to severe tricuspid regurgitation.  Home Medications  Prior to Admission medications   Medication Sig Start Date End Date Taking? Authorizing Provider  amLODipine (NORVASC) 5 MG tablet Take 5 mg by mouth 2 (two) times daily.    Yes Historical Provider, MD  carvedilol (COREG) 12.5 MG tablet Take 1.5 tablets (18.75 mg total) by mouth 2 (two) times daily. 08/09/14  Yes Rogelia Mire, NP  gabapentin (NEURONTIN) 100 MG capsule Take 100 mg by mouth daily. 08/27/13  Yes Historical Provider, MD  losartan-hydrochlorothiazide (HYZAAR) 100-12.5 MG per tablet Take 1 tablet by mouth daily.     Yes Historical Provider, MD  Multiple Vitamin (MULTIVITAMIN PO) Take 1 tablet by mouth daily.    Yes Historical Provider, MD  NITROSTAT 0.4 MG SL tablet Place 0.4 mg under the tongue every 5 (five) minutes as needed for chest pain.    Yes Historical Provider, MD  apixaban (ELIQUIS) 5 MG TABS tablet Take 1 tablet (5 mg total) by mouth 2 (two) times daily. 08/09/14   Rogelia Mire, NP    Review of Systems  As above, she's been doing well since her discharge from the emergency department.  She denies chest pain, palpitations, dyspnea, pnd, orthopnea, n, v, dizziness, syncope, edema, weight gain, or early satiety.  All other systems reviewed and are otherwise negative except as noted above.  Physical Exam  VS:  BP  130/80 mmHg  Pulse 102  Ht 5\' 1"  (1.549 m)  Wt 111 lb (50.349 kg)  BMI 20.98 kg/m2 , BMI Body mass index is 20.98 kg/(m^2). GEN: Well nourished, well developed, in no acute distress. HEENT: normal. Neck: Supple, no JVD, carotid bruits, or masses. Cardiac: Irregular S1 and S2, 2/6 systolic murmur at the left lower sternal border, no rubs, or gallops. No clubbing, cyanosis, edema.  Radials/DP/PT 2+ and equal bilaterally.  Respiratory:  Respirations regular and unlabored,  clear to auscultation bilaterally. GI: Soft, nontender, nondistended, BS + x 4. MS: no deformity or atrophy. Skin: warm and dry, no rash. Neuro:  Strength and sensation are intact. Psych: Normal affect.  Accessory Clinical Findings  ECG - ECG today is more consistent with atrial flutter at a rate of 102, rightward axis. No acute ST or T changes. ECG in the ER July 19 is less organized and more consistent with atrial fibrillation.  Assessment & Plan  1.  Atrial flutter/fibrillation: Patient presented to the ED on July 19 with complaints of palpitations and was found to be in A. fib. She was supposed to be started on xarelto but she says that she doesn't think she's been taking it. Review of ER records shows that she was given a prescription for just 10 pills. She says it's possible that she took them and already finished them, but she is not taking them as of today.  I reviewed her ECGs with Dr. Caryl Comes to determine whether or not he felt she was a potential candidate for ablation. As she did have what appears to be atrial fibrillation in the emergency department with what is most likely an atypical atrial flutter today, he does not feel that ablation would be appropriate first approach. As a result, we will initiate eliquis 5 mg twice a day. As she is currently asymptomatic, I will increase her carvedilol to 18.75 mg twice a day for improved rate control. We will arrange for follow-up in 3-4 weeks at which point if she remains in atrial fib/flutter, we can arrange for cardioversion. Her labs in the ER were unrevealing however she did not have a TSH. We will order this for today. If you have any recurrence of chest discomfort, I would have a low threshold to obtain a Cardiolite.  2. Essential hypertension: Stable. I am increasing beta blocker in the setting of elevated heart rates.  3. Renal artery stenosis: Stable by most recent renal duplex.  4. Carotid arterial disease: Followed by vascular  surgery.  6. Disposition: Follow-up in 3-4 weeks to consider cardioversion.    Murray Hodgkins, NP 08/09/2014, 5:30 PM

## 2014-08-10 ENCOUNTER — Telehealth: Payer: Self-pay

## 2014-08-10 NOTE — Telephone Encounter (Signed)
-----   Message from Belva Crome, MD sent at 08/09/2014 10:02 PM EDT ----- Normal LV function. Mild increased wall thickness.  Mild increase PA pressure and moderate TR. Overall, no serious abnormality noted

## 2014-08-10 NOTE — Telephone Encounter (Signed)
Called pt twice to give echo results.pt unable to hear me. Will attempt to call agian

## 2014-08-10 NOTE — Telephone Encounter (Signed)
Pt aware of echo results  Normal LV function. Mild increased wall thickness. Mild increase PA pressure and moderate TR. Overall, no serious abnormality noted Pt verbalized understanding.

## 2014-08-16 ENCOUNTER — Emergency Department (HOSPITAL_COMMUNITY): Payer: Commercial Managed Care - HMO

## 2014-08-16 ENCOUNTER — Encounter (HOSPITAL_COMMUNITY): Payer: Self-pay | Admitting: Nurse Practitioner

## 2014-08-16 ENCOUNTER — Emergency Department (HOSPITAL_COMMUNITY)
Admission: EM | Admit: 2014-08-16 | Discharge: 2014-08-17 | Disposition: A | Discharge status: Home / Self Care | Payer: Commercial Managed Care - HMO | Attending: Emergency Medicine | Admitting: Emergency Medicine

## 2014-08-16 DIAGNOSIS — I4891 Unspecified atrial fibrillation: Secondary | ICD-10-CM | POA: Insufficient documentation

## 2014-08-16 DIAGNOSIS — Z8742 Personal history of other diseases of the female genital tract: Secondary | ICD-10-CM | POA: Diagnosis not present

## 2014-08-16 DIAGNOSIS — I10 Essential (primary) hypertension: Secondary | ICD-10-CM | POA: Insufficient documentation

## 2014-08-16 DIAGNOSIS — R011 Cardiac murmur, unspecified: Secondary | ICD-10-CM | POA: Insufficient documentation

## 2014-08-16 DIAGNOSIS — Z79899 Other long term (current) drug therapy: Secondary | ICD-10-CM | POA: Diagnosis not present

## 2014-08-16 DIAGNOSIS — I499 Cardiac arrhythmia, unspecified: Secondary | ICD-10-CM | POA: Insufficient documentation

## 2014-08-16 DIAGNOSIS — R079 Chest pain, unspecified: Secondary | ICD-10-CM | POA: Diagnosis present

## 2014-08-16 LAB — BASIC METABOLIC PANEL
ANION GAP: 9 (ref 5–15)
BUN: 27 mg/dL — ABNORMAL HIGH (ref 6–20)
CHLORIDE: 108 mmol/L (ref 101–111)
CO2: 23 mmol/L (ref 22–32)
CREATININE: 1.31 mg/dL — AB (ref 0.44–1.00)
Calcium: 9.7 mg/dL (ref 8.9–10.3)
GFR calc non Af Amer: 38 mL/min — ABNORMAL LOW (ref >60)
GFR, EST AFRICAN AMERICAN: 44 mL/min — AB (ref >60)
Glucose, Bld: 114 mg/dL — ABNORMAL HIGH (ref 65–99)
Potassium: 3.8 mmol/L (ref 3.5–5.1)
SODIUM: 140 mmol/L (ref 135–145)

## 2014-08-16 LAB — CBC
HCT: 40.4 % (ref 36.0–46.0)
Hemoglobin: 13.5 g/dL (ref 12.0–15.0)
MCH: 31.8 pg (ref 26.0–34.0)
MCHC: 33.4 g/dL (ref 30.0–36.0)
MCV: 95.3 fL (ref 78.0–100.0)
PLATELETS: 183 10*3/uL (ref 150–400)
RBC: 4.24 MIL/uL (ref 3.87–5.11)
RDW: 14.8 % (ref 11.5–15.5)
WBC: 4.1 10*3/uL (ref 4.0–10.5)

## 2014-08-16 LAB — I-STAT TROPONIN, ED: Troponin i, poc: 0 ng/mL (ref 0.00–0.08)

## 2014-08-16 NOTE — ED Provider Notes (Signed)
CSN: [id number]     Arrival date & time 08/16/14  1810 History  This chart was scribed for Varney Biles, MD by Irene Pap, ED Scribe. This patient was seen in room B14C/B14C and patient care was started at 11:52 PM.   Chief Complaint  Patient presents with  . Chest Pain   The history is provided by the patient. No language interpreter was used.  HPI Comments: Donna [name] is a 79 y.o. Female with a hx of CAD, HTN and atrial fibrilation who presents to the Emergency Department complaining of left sided chest pain lasting 8 minutes onset 6 hours ago. Pt states that she was driving home when she began to experience sharp, aching, non-radiating, mild left sided chest pain that she has never experienced before. She denies anything makes the pain better or worse and denies any other episodes since arriving in the ED. Husband states that she was recently seen by cardiology and diagnosed with Afib, with symptoms of palpitations and chest discomfort. She states that she was prescribed Eliquis. Pt states that today's pain was worse than her previous chest discomfort.  Pt denies SOB, nausea, numbness, tingling or vomiting. Pt denies hx of smoking or DM. Pt denies chest pain or shortness of breath with exertion.  Past Medical History  Diagnosis Date  . Essential hypertension   . Atherosclerosis of renal artery     a. s/p R RA stenting;  b. 04/2014 Renal Art duplex: Patent R RA stent, <60% L RA stenosis.  . Fibrocystic breast   . Fibromuscular dysplasia   . Carotid arterial disease     a. 01/2014 Carotid U/S: RICA 98%, LICA 33%.  . Atrial Fibrillation/Flutter     a. Dx 07/26/2014, CHA2DS2VASc = 5-->Eliquis;  c. 08/2014 Echo: EF 55-60%, no rwma, mildly dil LA/RA, mod-sev TR, PASP 37mmHg.   Past Surgical History  Procedure Laterality Date  . Renal artery stent  2008    Right renal artery by Dr. Drucie Opitz  . Hemorroidectomy    . Tubal ligation  1970's  . Tonsillectomy     Family History  Problem  Relation Age of Onset  . Stroke Mother 32  . Diabetes Mother   . Hypertension Mother   . Aneurysm Father     abdominal aortic aneurysm  . Diabetes Father   . Hypertension Father   . Cancer Sister 82    breast cancer   Social History  Substance Use Topics  . Smoking status: Never Smoker   . Smokeless tobacco: Never Used  . Alcohol Use: No   OB History    Gravida Para Term Preterm AB TAB SAB Ectopic Multiple Living   4 4 4  0 0 0 0 0 0 4     Review of Systems A complete 10 system review of systems was obtained and all systems are negative except as noted in the HPI and PMH.   Allergies  Tagamet  Home Medications   Prior to Admission medications   Medication Sig Start Date End Date Taking? Authorizing Provider  apixaban (ELIQUIS) 5 MG TABS tablet Take 1 tablet (5 mg total) by mouth 2 (two) times daily. 08/09/14  Yes Rogelia Mire, NP  carvedilol (COREG) 12.5 MG tablet Take 1.5 tablets (18.75 mg total) by mouth 2 (two) times daily. 08/09/14  Yes Rogelia Mire, NP  metoprolol tartrate (LOPRESSOR) 25 MG tablet Take 12.5 mg by mouth daily.   Yes Historical Provider, MD  Multiple Vitamin (MULTIVITAMIN PO) Take 1  tablet by mouth daily.    Yes Historical Provider, MD  NITROSTAT 0.4 MG SL tablet Place 0.4 mg under the tongue every 5 (five) minutes as needed for chest pain.    Yes Historical Provider, MD   BP 140/62 mmHg  Pulse 71  Temp(Src) 97.6 F (36.4 C) (Oral)  Resp 16  Ht 5\' 1"  (1.549 m)  Wt 112 lb (50.803 kg)  BMI 21.17 kg/m2  SpO2 100%  Physical Exam  Constitutional: She appears well-developed and well-nourished.  HENT:  Head: Normocephalic and atraumatic.  Eyes: Conjunctivae are normal. Pupils are equal, round, and reactive to light. Right eye exhibits no discharge. Left eye exhibits no discharge.  Neck: Normal range of motion. No JVD present.  Cardiovascular: Normal rate.  An irregularly irregular rhythm present.  Murmur heard. Pulses:      Radial pulses  are 2+ on the right side, and 2+ on the left side.  Pulmonary/Chest: Effort normal. No respiratory distress.  Abdominal: Soft. There is no tenderness.  Neurological: She is alert. Coordination normal.  Skin: Skin is warm and dry. No rash noted. She is not diaphoretic. No erythema.  Psychiatric: She has a normal mood and affect.  Nursing note and vitals reviewed.   ED Course  Procedures (including critical care time) DIAGNOSTIC STUDIES: Oxygen Saturation is 100% on RA, normal by my interpretation.    COORDINATION OF CARE: 11:58 PM-Discussed treatment plan which includes 1 dose of aspirin, cardiac enzyme blood work and follow up with cardiologist with pt at bedside and pt agreed to plan.   Labs Review Labs Reviewed  BASIC METABOLIC PANEL - Abnormal; Notable for the following:    Glucose, Bld 114 (*)    BUN 27 (*)    Creatinine, Ser 1.31 (*)    GFR calc non Af Amer 38 (*)    GFR calc Af Amer 44 (*)    All other components within normal limits  CBC  I-STAT TROPOININ, ED  I-STAT TROPOININ, ED    Imaging Review Dg Chest 2 View  08/16/2014   CLINICAL DATA:  Chest pain  EXAM: CHEST  2 VIEW  COMPARISON:  July 25, 2014 and September 17, 2013  FINDINGS: There is mild bibasilar atelectatic change. There is no edema or consolidation. Heart is slightly enlarged with pulmonary vascularity within normal limits. No adenopathy. There is atherosclerotic change in the aortic arch region. There is cystic change in the distal clavicles bilaterally, stable.  IMPRESSION: Bibasilar atelectatic change. No edema or consolidation. Stable mild cardiomegaly.   Electronically Signed   By: Lowella Grip III M.D.   On: 08/16/2014 18:46     EKG Interpretation   Date/Time:  Wednesday August 16 2014 18:15:15 EDT Ventricular Rate:  97 PR Interval:    QRS Duration: 76 QT Interval:  290 QTC Calculation: 368 R Axis:   87 Text Interpretation:  Atrial fibrillation Low voltage QRS Septal infarct ,  age  undetermined Abnormal ECG since last tracing no significant change  Confirmed by MILLER  MD, BRIAN ([id number]) on 08/16/2014 6:19:18 PM      MDM   Final diagnoses:  Chest pain, unspecified chest pain type    I personally performed the services described in this documentation, which was scribed in my presence. The recorded information has been reviewed and is accurate.  Pt comes in with cc of chest pain. Chest pain is atypical, and lasted for few minutes few hours ago. No recurrence since then. She has no hx of CAD that  we know off, but has never had any provocative testing. She was recently diagnosed with afib and is in fib at the present. Pt has no exertional chest pain, dib - and she is fairly active person. HEAR score is 3. Trops x 2 ordered, if neg, we will discharge her with strict return precautions.  Given that she has some thrombosis (carotids, renal) and new onset adib- we have emailed the Cardiology service to ensure she gets a stat f/u to see if she needs any provocative testing.    Varney Biles, MD 08/17/14 0120

## 2014-08-16 NOTE — ED Notes (Addendum)
Pt was driving and began to have L sided sharp CP that resolved after about 10 minutes. She denies SOB, nausea. Denies pain now. She reports recent change in BP medication and new dx of Afib

## 2014-08-17 DIAGNOSIS — R079 Chest pain, unspecified: Secondary | ICD-10-CM | POA: Diagnosis not present

## 2014-08-17 LAB — I-STAT TROPONIN, ED: TROPONIN I, POC: 0 ng/mL (ref 0.00–0.08)

## 2014-08-17 MED ORDER — ASPIRIN 81 MG PO CHEW
324.0000 mg | CHEWABLE_TABLET | Freq: Once | ORAL | Status: AC
  Administered 2014-08-17: 324 mg via ORAL
  Filled 2014-08-17: qty 4

## 2014-08-17 MED ORDER — ASPIRIN 81 MG PO CHEW: 81.0000 mg | CHEWABLE_TABLET | Freq: Once | ORAL | Status: DC

## 2014-08-17 NOTE — Discharge Instructions (Signed)
We saw you in the ER for the chest pain/shortness of breath. All of our cardiac workup is normal, including labs, EKG and chest X-RAY are normal. We are not sure what is causing your discomfort, but we feel comfortable sending you home at this time.    The workup in the ER is not complete, and so it is important that you follow up with Cardiology doctors. We have given you the phone number for the Cardiology team, and also messaged the cardiology team to get in touch with you, so that you can be seen soon. Return to the ER if there is repeat chest pain.   Chest Pain (Nonspecific) It is often hard to give a specific diagnosis for the cause of chest pain. There is always a chance that your pain could be related to something serious, such as a heart attack or a blood clot in the lungs. You need to follow up with your health care provider for further evaluation. CAUSES   Heartburn.  Pneumonia or bronchitis.  Anxiety or stress.  Inflammation around your heart (pericarditis) or lung (pleuritis or pleurisy).  A blood clot in the lung.  A collapsed lung (pneumothorax). It can develop suddenly on its own (spontaneous pneumothorax) or from trauma to the chest.  Shingles infection (herpes zoster virus). The chest wall is composed of bones, muscles, and cartilage. Any of these can be the source of the pain.  The bones can be bruised by injury.  The muscles or cartilage can be strained by coughing or overwork.  The cartilage can be affected by inflammation and become sore (costochondritis). DIAGNOSIS  Lab tests or other studies may be needed to find the cause of your pain. Your health care provider may have you take a test called an ambulatory electrocardiogram (ECG). An ECG records your heartbeat patterns over a 24-hour period. You may also have other tests, such as:  Transthoracic echocardiogram (TTE). During echocardiography, sound waves are used to evaluate how blood flows through your  heart.  Transesophageal echocardiogram (TEE).  Cardiac monitoring. This allows your health care provider to monitor your heart rate and rhythm in real time.  Holter monitor. This is a portable device that records your heartbeat and can help diagnose heart arrhythmias. It allows your health care provider to track your heart activity for several days, if needed.  Stress tests by exercise or by giving medicine that makes the heart beat faster. TREATMENT   Treatment depends on what may be causing your chest pain. Treatment may include:  Acid blockers for heartburn.  Anti-inflammatory medicine.  Pain medicine for inflammatory conditions.  Antibiotics if an infection is present.  You may be advised to change lifestyle habits. This includes stopping smoking and avoiding alcohol, caffeine, and chocolate.  You may be advised to keep your head raised (elevated) when sleeping. This reduces the chance of acid going backward from your stomach into your esophagus. Most of the time, nonspecific chest pain will improve within 2-3 days with rest and mild pain medicine.  HOME CARE INSTRUCTIONS   If antibiotics were prescribed, take them as directed. Finish them even if you start to feel better.  For the next few days, avoid physical activities that bring on chest pain. Continue physical activities as directed.  Do not use any tobacco products, including cigarettes, chewing tobacco, or electronic cigarettes.  Avoid drinking alcohol.  Only take medicine as directed by your health care provider.  Follow your health care provider's suggestions for further testing if  your chest pain does not go away.  Keep any follow-up appointments you made. If you do not go to an appointment, you could develop lasting (chronic) problems with pain. If there is any problem keeping an appointment, call to reschedule. SEEK MEDICAL CARE IF:   Your chest pain does not go away, even after treatment.  You have a rash  with blisters on your chest.  You have a fever. SEEK IMMEDIATE MEDICAL CARE IF:   You have increased chest pain or pain that spreads to your arm, neck, jaw, back, or abdomen.  You have shortness of breath.  You have an increasing cough, or you cough up blood.  You have severe back or abdominal pain.  You feel nauseous or vomit.  You have severe weakness.  You faint.  You have chills. This is an emergency. Do not wait to see if the pain will go away. Get medical help at once. Call your local emergency services (911 in [place] Do not drive yourself to the hospital. MAKE SURE YOU:   Understand these instructions.  Will watch your condition.  Will get help right away if you are not doing well or get worse. Document Released: 10/02/2004 Document Revised: 12/28/2012 Document Reviewed: 07/29/2007 [facility] Patient Information 2015 [place], [place]. This information is not intended to replace advice given to you by your health care provider. Make sure you discuss any questions you have with your health care provider.

## 2014-08-21 ENCOUNTER — Encounter: Payer: Self-pay | Admitting: Interventional Cardiology

## 2014-08-22 ENCOUNTER — Telehealth: Payer: Self-pay | Admitting: Interventional Cardiology

## 2014-08-22 MED ORDER — AMLODIPINE BESYLATE 5 MG PO TABS: 5.0000 mg | ORAL_TABLET | Freq: Every day | ORAL | Status: DC

## 2014-08-22 NOTE — Telephone Encounter (Signed)
Waited for patient to call back, decided to call her instead. No answer, left message for patient to call back.

## 2014-08-22 NOTE — Telephone Encounter (Signed)
New message      I called pt to confirm her appt with Dayna for thurs.  She said she is very weak and not sure she can make it until thurs.  I told her I would have a nurse call her back.  Please call

## 2014-08-22 NOTE — Telephone Encounter (Signed)
Called patient back. Patient complaining of feeling bad and weakness. Patient stated in a tearful voice, "I am a high energy person, but I feel like a zombie". Patient saw Dr. Volney American at her PCP doctor's office last week. He started patient on Furosemide 20 mg by mouth as needed for feet swelling. Patient stated she has taken the Furosemide every day and she still has swollen feet. Questioned patient about any other medications she is taking and phone was disconnected. Tried to call patient back, but phone was busy.   Called patient again. Patient confirmed all her medications that she is taking at this time. BP 130/81 and HR 77. Patient is not sure if her BP machine is accurate, so she is getting off the phone to go fine her new BP machine. Patient will call back.

## 2014-08-22 NOTE — Telephone Encounter (Signed)
Patient walked in to the office with her BP reading of 127/105, instead of calling. Patient is only taking Coreg 18.75 BID, Lasix 20 mg PRN for swelling, Eliquis 5 mg BID, and multiple vitamin 1 tab daily. Checked vital signs here at the office BP 140/112, HR 62, Resp. 18 and O2 98% on room air. Patient complaining of low energy and some swelling in feet. Patient has some swelling in ankles. Advised patient to elevated her feet when sitting. Consulted Scott [place] PA (FLEX) about patient's medication list and her VS. Nicki Reaper restarted patient on Norvasc 5 mg daily and patient can follow-up with Melina Copa PA on this Thursday. Patient verbalized understanding and her medication has been sent to her pharmacy of choice.

## 2014-08-24 ENCOUNTER — Ambulatory Visit (INDEPENDENT_AMBULATORY_CARE_PROVIDER_SITE_OTHER): Payer: Commercial Managed Care - HMO | Admitting: Physician Assistant

## 2014-08-24 ENCOUNTER — Encounter: Payer: Self-pay | Admitting: Physician Assistant

## 2014-08-24 VITALS — BP 122/78 | HR 138 | Ht 61.0 in | Wt 115.0 lb

## 2014-08-24 DIAGNOSIS — I10 Essential (primary) hypertension: Secondary | ICD-10-CM

## 2014-08-24 DIAGNOSIS — R079 Chest pain, unspecified: Secondary | ICD-10-CM

## 2014-08-24 DIAGNOSIS — I480 Paroxysmal atrial fibrillation: Secondary | ICD-10-CM

## 2014-08-24 DIAGNOSIS — I4892 Unspecified atrial flutter: Secondary | ICD-10-CM | POA: Diagnosis not present

## 2014-08-24 DIAGNOSIS — N183 Chronic kidney disease, stage 3 unspecified: Secondary | ICD-10-CM

## 2014-08-24 MED ORDER — DILTIAZEM HCL ER COATED BEADS 120 MG PO CP24: 120.0000 mg | ORAL_CAPSULE | Freq: Every day | ORAL | Status: DC

## 2014-08-24 NOTE — Progress Notes (Addendum)
Cardiology Office Note Date:  08/24/2014  Patient ID:  Donna [name], Donna [name] Oct 09, 1935, MRN [id number] PCP:  Henrine Screws, MD  Cardiologist:  Linard Millers   Chief Complaint: f/u ER visit for chest pain, "feeling funny"  History of Present Illness: Donna [name] is a 79 y.o. female with history of hypertension, renal artery stenosis, moderate carotid arterial disease 01/2014, recently diagnosed paroxysmal atrial fibrillation/flutter who presents for follow-up.   With regard to recent history, her AF was diagnosed in July. She was supposed to be discharged with Xarelto and BB therapy but the patient didn't think she had ever took this. She was seen by Ignacia Bayley NP on 08/09/14 at which time she was started on Eliquis. The case has been discussed with Dr. Caryl Comes who felt that since she had atrial fib and atypical atrial flutter, she would not be an ablation candidate. The plan was to see back in 3-4 weeks to discuss DCCV. TSH WNL. 2D Echo 08/09/14: EF 55-60%, mild biatrial enlargement, mild MR, mod-severe TR, mild-mod elevation in pulm pressure. She was then seen in the ER 08/16/14 complaining of chest pain. Troponin was negative and chest pain was felt atypical by EDP thus she was discharged with plan for outpatient follow-up. ER notable for BUN/Cr 27/1.31, glucose 114, normal CBC. F/u labs 08/18/14 at PCP showed BUN 27/Cr 1.28, K 4.0, BNP 954. Dr. Tamala Julian felt the elevated BNP was likely 2/2 stress of the arrhythmia.  She called in today because she was feeling funny. She reports continued palpitations. Energy is not as good. She denies any further CP. No SOB. She endorses mild ankle edema and takes Lasix PRN. Surprisingly she says she feels "excellent" today despite having a HR of 130s in clinic, which occasionally varies in the 110-130 range by auscultation.   Past Medical History  Diagnosis Date  . Essential hypertension   . Atherosclerosis of renal artery     a. s/p R RA stenting;  b. 04/2014 Renal  Art duplex: Patent R RA stent, <60% L RA stenosis.  . Fibrocystic breast   . Fibromuscular dysplasia   . Carotid arterial disease     a. 01/2014 Carotid U/S: RICA 94%, LICA 85%.  . Atrial Fibrillation/Flutter     a. Dx 07/26/2014, CHA2DS2VASc = 5-->Eliquis;  c. 08/2014 Echo: EF 55-60%, no rwma, mildly dil LA/RA, mod-sev TR, PASP 6mmHg.  Marland Kitchen Hyperglycemia   . CKD (chronic kidney disease), stage III   . Tricuspid regurgitation   . Mitral regurgitation     Past Surgical History  Procedure Laterality Date  . Renal artery stent  2008    Right renal artery by Dr. Drucie Opitz  . Hemorroidectomy    . Tubal ligation  1970's  . Tonsillectomy      Current Outpatient Prescriptions  Medication Sig Dispense Refill  . amLODipine (NORVASC) 5 MG tablet Take 1 tablet (5 mg total) by mouth daily. 30 tablet 1  . apixaban (ELIQUIS) 5 MG TABS tablet Take 1 tablet (5 mg total) by mouth 2 (two) times daily. 60 tablet 5  . carvedilol (COREG) 12.5 MG tablet Take 1.5 tablets (18.75 mg total) by mouth 2 (two) times daily. 45 tablet 5  . ESTRACE VAGINAL 0.1 MG/GM vaginal cream Place 1 application vaginally at bedtime.    . furosemide (LASIX) 20 MG tablet Take 20 mg by mouth as needed for edema.    . Multiple Vitamin (MULTIVITAMIN PO) Take 1 tablet by mouth daily.     Marland Kitchen  NITROSTAT 0.4 MG SL tablet Place 0.4 mg under the tongue every 5 (five) minutes as needed for chest pain.      No current facility-administered medications for this visit.    Allergies:   Tagamet   Social History:  The patient  reports that she has never smoked. She has never used smokeless tobacco. She reports that she does not drink alcohol or use illicit drugs.   Family History:  The patient's family history includes Aneurysm in her father; Cancer (age of onset: 76) in her sister; Diabetes in her father and mother; Hypertension in her father and mother; Stroke (age of onset: 14) in her mother.  ROS:  Please see the history of present  illness.  All other systems are reviewed and otherwise negative.   PHYSICAL EXAM:  VS:  BP 122/78 mmHg  Pulse 138  Ht 5\' 1"  (1.549 m)  Wt 115 lb (52.164 kg)  BMI 21.74 kg/m2  SpO2 97% BMI: Body mass index is 21.74 kg/(m^2). Well nourished, well developed thin AAF, in no acute distress, lively, smiling HEENT: normocephalic, atraumatic Neck: no JVD, carotid bruits or masses Cardiac:  Irregularly irregular, tachycardic, S1, S2; no murmurs, rubs, or gallops Lungs:  clear to auscultation bilaterally, no wheezing, rhonchi or rales Abd: soft, nontender, no hepatomegaly, + BS MS: no deformity or atrophy Ext: minimal pedal edema non pitting Skin: warm and dry, no rash Neuro:  moves all extremities spontaneously, no focal abnormalities noted, follows commands Psych: euthymic mood, full affect   EKG:  Done today shows atrial fib with RVR 138bpm, low voltage QRS nonspecific ST-T changes, possible prior anterolateral infarct. No sig change from prior except rate is faster.  Recent Labs: 07/25/2014: ALT 48 08/09/2014: TSH 0.58 08/16/2014: BUN 27*; Creatinine, Ser 1.31*; Hemoglobin 13.5; Platelets 183; Potassium 3.8; Sodium 140  No results found for requested labs within last 365 days.   Estimated Creatinine Clearance: 27.1 mL/min (by C-G formula based on Cr of 1.31).   Wt Readings from Last 3 Encounters:  08/24/14 115 lb (52.164 kg)  08/16/14 112 lb (50.803 kg)  08/09/14 111 lb (50.349 kg)     Other studies reviewed: Additional studies/records reviewed today include: summarized above  ASSESSMENT AND PLAN:  1. Paroxysmal atrial fibrillation/paroxysmal atrial flutter - elevated HR in clinic today. She is minimally symptomatic today but does report feeling "funny" in her chest with palpitations. I discussed case with Dr. Caryl Comes. Will d/c amlodipine and start Diltiazem CD 120mg  daily. Will arrange DCCV for after 08/30/14 (3 weeks after initiation of Eliquis on 08/09/14). She reports full med  compliance with Eliquis. Importance of not missing doses reinforced. Risks, benefits, alternatives to cardioversion discussed and she is agreeable to proceeding. Suspect minimal edema is due to some diastolic dysfunction in setting of AF. This should improve with better rate control. Hesitant to push diuretics given renal insufficiency. 2. Chest pain - recent eval for such with negative troponins. May be due to the above, but in light of carotid disease and HTN, will arrange [place] nuclear stress test to exclude ischemia as contributor to the above. 3. Essential HTN - follow BP with changes above. 4. CKD stage III - we will need to follow this with time because if Cr goes >1.5, she will require dose reduction to 2.5mg  BID. Consider f/u BMET at next [place]. 5. Valvular heart disease - mod-severe TR, mild MR by recent echo. Follow clinically.  Disposition: F/u with Dr. Tamala Julian 2 weeks after DCCV.  Current medicines are  reviewed at length with the patient today.  The patient did not have any concerns regarding medicines.  Signed, Melina Copa PA-C 08/24/2014 2:26 PM     [address] [place] [address] [phone] (office)  [phone] (fax)

## 2014-08-24 NOTE — Patient Instructions (Addendum)
Medication Instructions:  STOP Amlodipine  START Diltiazem CD 120mg  daily. An Rx has been sent to your pharmacy  Labwork: Your physician recommends that you return for lab work on the same day as your lexiscan   Testing/Procedures: Your physician has requested that you have a lexiscan myoview. For further information please visit [email or web address]. Please follow instruction sheet, as given.  Your physician has recommended that you have a Cardioversion (DCCV). Electrical Cardioversion uses a jolt of electricity to your heart either through paddles or wired patches attached to your chest. This is a controlled, usually prescheduled, procedure. Defibrillation is done under light anesthesia in the hospital, and you usually go home the day of the procedure. This is done to get your heart back into a normal rhythm. You are not awake for the procedure. Please see the instruction sheet given to you today.    Follow-Up: Your physician recommends that you schedule a follow-up appointment in: 3 weeks with an APP a day that Dr.Smith is in the office   Any Other Special Instructions Will Be Listed Below (If Applicable).

## 2014-08-25 ENCOUNTER — Telehealth (HOSPITAL_COMMUNITY): Payer: Self-pay | Admitting: *Deleted

## 2014-08-25 NOTE — Telephone Encounter (Signed)
Patient given detailed instructions per Myocardial Perfusion Study Information Sheet for test on 08/28/14 at 7:45. Patient Notified to arrive 15 minutes early, and that it is imperative to arrive on time for appointment to keep from having the test rescheduled. Patient verbalized understanding. Veronia Beets

## 2014-08-28 ENCOUNTER — Ambulatory Visit (HOSPITAL_COMMUNITY): Payer: Commercial Managed Care - HMO | Attending: Internal Medicine

## 2014-08-28 DIAGNOSIS — I739 Peripheral vascular disease, unspecified: Secondary | ICD-10-CM | POA: Diagnosis not present

## 2014-08-28 DIAGNOSIS — I10 Essential (primary) hypertension: Secondary | ICD-10-CM | POA: Diagnosis not present

## 2014-08-28 DIAGNOSIS — I6529 Occlusion and stenosis of unspecified carotid artery: Secondary | ICD-10-CM | POA: Diagnosis not present

## 2014-08-28 DIAGNOSIS — I4891 Unspecified atrial fibrillation: Secondary | ICD-10-CM | POA: Insufficient documentation

## 2014-08-28 DIAGNOSIS — R079 Chest pain, unspecified: Secondary | ICD-10-CM | POA: Diagnosis not present

## 2014-08-28 LAB — MYOCARDIAL PERFUSION IMAGING
CHL CUP NUCLEAR SDS: 2
CHL CUP NUCLEAR SSS: 5
Peak HR: 123 {beats}/min
RATE: 0.25
Rest HR: 105 {beats}/min
SRS: 3
TID: 1.12

## 2014-08-28 MED ORDER — TECHNETIUM TC 99M SESTAMIBI GENERIC - CARDIOLITE
10.3000 | Freq: Once | INTRAVENOUS | Status: AC | PRN
  Administered 2014-08-28: 10 via INTRAVENOUS

## 2014-08-28 MED ORDER — TECHNETIUM TC 99M SESTAMIBI GENERIC - CARDIOLITE
32.8000 | Freq: Once | INTRAVENOUS | Status: AC | PRN
  Administered 2014-08-28: 32.8 via INTRAVENOUS

## 2014-08-28 MED ORDER — REGADENOSON 0.4 MG/5ML IV SOLN
0.4000 mg | Freq: Once | INTRAVENOUS | Status: AC
  Administered 2014-08-28: 0.4 mg via INTRAVENOUS

## 2014-08-29 ENCOUNTER — Other Ambulatory Visit: Payer: Self-pay | Admitting: Physician Assistant

## 2014-09-01 ENCOUNTER — Encounter (HOSPITAL_COMMUNITY)
Admission: RE | Disposition: A | Discharge status: Home / Self Care | Payer: Self-pay | Source: Ambulatory Visit | Attending: Cardiovascular Disease

## 2014-09-01 ENCOUNTER — Ambulatory Visit (HOSPITAL_COMMUNITY): Payer: Commercial Managed Care - HMO | Admitting: Anesthesiology

## 2014-09-01 ENCOUNTER — Ambulatory Visit (HOSPITAL_COMMUNITY)
Admission: RE | Admit: 2014-09-01 | Discharge: 2014-09-01 | Disposition: A | Discharge status: Home / Self Care | Payer: Commercial Managed Care - HMO | Source: Ambulatory Visit | Attending: Cardiovascular Disease | Admitting: Cardiovascular Disease

## 2014-09-01 ENCOUNTER — Encounter (HOSPITAL_COMMUNITY): Payer: Self-pay | Admitting: Anesthesiology

## 2014-09-01 DIAGNOSIS — I4892 Unspecified atrial flutter: Secondary | ICD-10-CM | POA: Insufficient documentation

## 2014-09-01 DIAGNOSIS — Z79899 Other long term (current) drug therapy: Secondary | ICD-10-CM | POA: Insufficient documentation

## 2014-09-01 DIAGNOSIS — I129 Hypertensive chronic kidney disease with stage 1 through stage 4 chronic kidney disease, or unspecified chronic kidney disease: Secondary | ICD-10-CM | POA: Insufficient documentation

## 2014-09-01 DIAGNOSIS — I480 Paroxysmal atrial fibrillation: Secondary | ICD-10-CM | POA: Diagnosis present

## 2014-09-01 DIAGNOSIS — Z7901 Long term (current) use of anticoagulants: Secondary | ICD-10-CM | POA: Insufficient documentation

## 2014-09-01 DIAGNOSIS — N183 Chronic kidney disease, stage 3 (moderate): Secondary | ICD-10-CM | POA: Diagnosis not present

## 2014-09-01 DIAGNOSIS — I081 Rheumatic disorders of both mitral and tricuspid valves: Secondary | ICD-10-CM | POA: Insufficient documentation

## 2014-09-01 DIAGNOSIS — I4891 Unspecified atrial fibrillation: Secondary | ICD-10-CM | POA: Diagnosis not present

## 2014-09-01 HISTORY — PX: CARDIOVERSION: SHX1299

## 2014-09-01 SURGERY — CARDIOVERSION
Anesthesia: General

## 2014-09-01 MED ORDER — PROPOFOL 10 MG/ML IV BOLUS
INTRAVENOUS | Status: DC | PRN
  Administered 2014-09-01: 50 mg via INTRAVENOUS

## 2014-09-01 MED ORDER — SODIUM CHLORIDE 0.9 % IJ SOLN: 3.0000 mL | Freq: Two times a day (BID) | INTRAMUSCULAR | Status: DC

## 2014-09-01 MED ORDER — LIDOCAINE HCL (CARDIAC) 20 MG/ML IV SOLN
INTRAVENOUS | Status: DC | PRN
  Administered 2014-09-01: 40 mg via INTRAVENOUS

## 2014-09-01 MED ORDER — SODIUM CHLORIDE 0.9 % IV SOLN
250.0000 mL | INTRAVENOUS | Status: DC
  Administered 2014-09-01: 500 mL via INTRAVENOUS

## 2014-09-01 MED ORDER — SODIUM CHLORIDE 0.9 % IJ SOLN: 3.0000 mL | INTRAMUSCULAR | Status: DC | PRN

## 2014-09-01 NOTE — Anesthesia Postprocedure Evaluation (Signed)
  Anesthesia Post-op Note  Patient: Donna [name]  Procedure(s) Performed: Procedure(s): CARDIOVERSION (N/A)  Patient Location: PACU  Anesthesia Type:General  Level of Consciousness: awake, alert  and oriented  Airway and Oxygen Therapy: Patient Spontanous Breathing  Post-op Pain: none  Post-op Assessment: Post-op Vital signs reviewed              Post-op Vital Signs: Reviewed  Last Vitals:  Filed Vitals:   09/01/14 0943  BP: 122/76  Pulse: 66  Temp:   Resp: 15    Complications: No apparent anesthesia complications

## 2014-09-01 NOTE — Discharge Instructions (Signed)

## 2014-09-01 NOTE — Transfer of Care (Signed)
Immediate Anesthesia Transfer of Care Note  Patient: Donna [name]  Procedure(s) Performed: Procedure(s): CARDIOVERSION (N/A)  Patient Location: PACU and Endoscopy Unit  Anesthesia Type:MAC  Level of Consciousness: awake, sedated and patient cooperative  Airway & Oxygen Therapy: Patient Spontanous Breathing and Patient connected to nasal cannula oxygen  Post-op Assessment: Report given to RN, Post -op Vital signs reviewed and stable and Patient moving all extremities  Post vital signs: Reviewed and stable  Last Vitals:  Filed Vitals:   09/01/14 0928  BP: 99/62  Pulse: 70  Temp:   Resp: 21    Complications: No apparent anesthesia complications

## 2014-09-01 NOTE — H&P (View-Only) (Signed)
Cardiology Office Note Date:  08/24/2014  Patient ID:  Donna [name], Donna [name] 1935/11/19, MRN [id number] PCP:  Henrine Screws, MD  Cardiologist:  Linard Millers   Chief Complaint: f/u ER visit for chest pain, "feeling funny"  History of Present Illness: Donna [name] is a 79 y.o. female with history of hypertension, renal artery stenosis, moderate carotid arterial disease 01/2014, recently diagnosed paroxysmal atrial fibrillation/flutter who presents for follow-up.   With regard to recent history, her AF was diagnosed in July. She was supposed to be discharged with Xarelto and BB therapy but the patient didn't think she had ever took this. She was seen by Ignacia Bayley NP on 08/09/14 at which time she was started on Eliquis. The case has been discussed with Dr. Caryl Comes who felt that since she had atrial fib and atypical atrial flutter, she would not be an ablation candidate. The plan was to see back in 3-4 weeks to discuss DCCV. TSH WNL. 2D Echo 08/09/14: EF 55-60%, mild biatrial enlargement, mild MR, mod-severe TR, mild-mod elevation in pulm pressure. She was then seen in the ER 08/16/14 complaining of chest pain. Troponin was negative and chest pain was felt atypical by EDP thus she was discharged with plan for outpatient follow-up. ER notable for BUN/Cr 27/1.31, glucose 114, normal CBC. F/u labs 08/18/14 at PCP showed BUN 27/Cr 1.28, K 4.0, BNP 954. Dr. Tamala Julian felt the elevated BNP was likely 2/2 stress of the arrhythmia.  She called in today because she was feeling funny. She reports continued palpitations. Energy is not as good. She denies any further CP. No SOB. She endorses mild ankle edema and takes Lasix PRN. Surprisingly she says she feels "excellent" today despite having a HR of 130s in clinic, which occasionally varies in the 110-130 range by auscultation.   Past Medical History  Diagnosis Date  . Essential hypertension   . Atherosclerosis of renal artery     a. s/p R RA stenting;  b. 04/2014 Renal  Art duplex: Patent R RA stent, <60% L RA stenosis.  . Fibrocystic breast   . Fibromuscular dysplasia   . Carotid arterial disease     a. 01/2014 Carotid U/S: RICA 68%, LICA 12%.  . Atrial Fibrillation/Flutter     a. Dx 07/26/2014, CHA2DS2VASc = 5-->Eliquis;  c. 08/2014 Echo: EF 55-60%, no rwma, mildly dil LA/RA, mod-sev TR, PASP 35mmHg.  Marland Kitchen Hyperglycemia   . CKD (chronic kidney disease), stage III   . Tricuspid regurgitation   . Mitral regurgitation     Past Surgical History  Procedure Laterality Date  . Renal artery stent  2008    Right renal artery by Dr. Drucie Opitz  . Hemorroidectomy    . Tubal ligation  1970's  . Tonsillectomy      Current Outpatient Prescriptions  Medication Sig Dispense Refill  . amLODipine (NORVASC) 5 MG tablet Take 1 tablet (5 mg total) by mouth daily. 30 tablet 1  . apixaban (ELIQUIS) 5 MG TABS tablet Take 1 tablet (5 mg total) by mouth 2 (two) times daily. 60 tablet 5  . carvedilol (COREG) 12.5 MG tablet Take 1.5 tablets (18.75 mg total) by mouth 2 (two) times daily. 45 tablet 5  . ESTRACE VAGINAL 0.1 MG/GM vaginal cream Place 1 application vaginally at bedtime.    . furosemide (LASIX) 20 MG tablet Take 20 mg by mouth as needed for edema.    . Multiple Vitamin (MULTIVITAMIN PO) Take 1 tablet by mouth daily.     Marland Kitchen  NITROSTAT 0.4 MG SL tablet Place 0.4 mg under the tongue every 5 (five) minutes as needed for chest pain.      No current facility-administered medications for this visit.    Allergies:   Tagamet   Social History:  The patient  reports that she has never smoked. She has never used smokeless tobacco. She reports that she does not drink alcohol or use illicit drugs.   Family History:  The patient's family history includes Aneurysm in her father; Cancer (age of onset: 36) in her sister; Diabetes in her father and mother; Hypertension in her father and mother; Stroke (age of onset: 41) in her mother.  ROS:  Please see the history of present  illness.  All other systems are reviewed and otherwise negative.   PHYSICAL EXAM:  VS:  BP 122/78 mmHg  Pulse 138  Ht 5\' 1"  (1.549 m)  Wt 115 lb (52.164 kg)  BMI 21.74 kg/m2  SpO2 97% BMI: Body mass index is 21.74 kg/(m^2). Well nourished, well developed thin AAF, in no acute distress, lively, smiling HEENT: normocephalic, atraumatic Neck: no JVD, carotid bruits or masses Cardiac:  Irregularly irregular, tachycardic, S1, S2; no murmurs, rubs, or gallops Lungs:  clear to auscultation bilaterally, no wheezing, rhonchi or rales Abd: soft, nontender, no hepatomegaly, + BS MS: no deformity or atrophy Ext: minimal pedal edema non pitting Skin: warm and dry, no rash Neuro:  moves all extremities spontaneously, no focal abnormalities noted, follows commands Psych: euthymic mood, full affect   EKG:  Done today shows atrial fib with RVR 138bpm, low voltage QRS nonspecific ST-T changes, possible prior anterolateral infarct. No sig change from prior except rate is faster.  Recent Labs: 07/25/2014: ALT 48 08/09/2014: TSH 0.58 08/16/2014: BUN 27*; Creatinine, Ser 1.31*; Hemoglobin 13.5; Platelets 183; Potassium 3.8; Sodium 140  No results found for requested labs within last 365 days.   Estimated Creatinine Clearance: 27.1 mL/min (by C-G formula based on Cr of 1.31).   Wt Readings from Last 3 Encounters:  08/24/14 115 lb (52.164 kg)  08/16/14 112 lb (50.803 kg)  08/09/14 111 lb (50.349 kg)     Other studies reviewed: Additional studies/records reviewed today include: summarized above  ASSESSMENT AND PLAN:  1. Paroxysmal atrial fibrillation/paroxysmal atrial flutter - elevated HR in clinic today. She is minimally symptomatic today but does report feeling "funny" in her chest with palpitations. I discussed case with Dr. Caryl Comes. Will d/c amlodipine and start Diltiazem CD 120mg  daily. Will arrange DCCV for after 08/30/14 (3 weeks after initiation of Eliquis on 08/09/14). She reports full med  compliance with Eliquis. Importance of not missing doses reinforced. Risks, benefits, alternatives to cardioversion discussed and she is agreeable to proceeding. Suspect minimal edema is due to some diastolic dysfunction in setting of AF. This should improve with better rate control. Hesitant to push diuretics given renal insufficiency. 2. Chest pain - recent eval for such with negative troponins. May be due to the above, but in light of carotid disease and HTN, will arrange [place] nuclear stress test to exclude ischemia as contributor to the above. 3. Essential HTN - follow BP with changes above. 4. CKD stage III - we will need to follow this with time because if Cr goes >1.5, she will require dose reduction to 2.5mg  BID. Consider f/u BMET at next [place]. 5. Valvular heart disease - mod-severe TR, mild MR by recent echo. Follow clinically.  Disposition: F/u with Dr. Tamala Julian 2 weeks after DCCV.  Current medicines are  reviewed at length with the patient today.  The patient did not have any concerns regarding medicines.  Signed, Melina Copa PA-C 08/24/2014 2:26 PM     [address] [place] [address] [phone] (office)  [phone] (fax)

## 2014-09-01 NOTE — Interval H&P Note (Signed)
History and Physical Interval Note:  09/01/2014 7:20 AM  Donna [name]  has presented today for surgery, with the diagnosis of AFIB  The various methods of treatment have been discussed with the patient and family. After consideration of risks, benefits and other options for treatment, the patient has consented to  Procedure(s): CARDIOVERSION (N/A) as a surgical intervention .  The patient's history has been reviewed, patient examined, no change in status, stable for surgery.  I have reviewed the patient's chart and labs.  Questions were answered to the patient's satisfaction.     [name]

## 2014-09-01 NOTE — CV Procedure (Signed)
DCC:  On Rx Eliquis with no missed doses Lidocaine 40 mg  Propofol 50 mg  DCC x1 150J biphasic synch.  Converted from afib at rate 115 to SR rate 68 No immediate neurologic sequelae  [name]

## 2014-09-01 NOTE — Anesthesia Preprocedure Evaluation (Addendum)
Anesthesia Evaluation  Patient identified by MRN, date of birth, ID band Patient awake    Reviewed: Allergy & Precautions, NPO status , Patient's Chart, lab work & pertinent test results  Airway Mallampati: II  TM Distance: >3 FB Neck ROM: Full    Dental   Pulmonary neg pulmonary ROS,  breath sounds clear to auscultation        Cardiovascular hypertension, Pt. on medications and Pt. on home beta blockers + Peripheral Vascular Disease (renal art stent) + dysrhythmias Atrial Fibrillation Rhythm:Irregular Rate:Normal  08/2014 Echo: EF 55-60%, no rwma, mildly dil LA/RA, mod-sev TR, mild MR 8/16 Stess: normal perfusion, no ischemia   Neuro/Psych negative neurological ROS     GI/Hepatic Neg liver ROS,   Endo/Other  negative endocrine ROS  Renal/GU Renal disease     Musculoskeletal   Abdominal   Peds  Hematology  (+) Blood dyscrasia (eliquis), ,   Anesthesia Other Findings   Reproductive/Obstetrics                           Anesthesia Physical Anesthesia Plan  ASA: II  Anesthesia Plan: General   Post-op Pain Management:    Induction: Intravenous  Airway Management Planned: Mask  Additional Equipment:   Intra-op Plan:   Post-operative Plan:   Informed Consent: I have reviewed the patients History and Physical, chart, labs and discussed the procedure including the risks, benefits and alternatives for the proposed anesthesia with the patient or authorized representative who has indicated his/her understanding and acceptance.   Dental advisory given  Plan Discussed with: CRNA  Anesthesia Plan Comments:         Anesthesia Quick Evaluation

## 2014-09-05 ENCOUNTER — Emergency Department (HOSPITAL_COMMUNITY)
Admission: EM | Admit: 2014-09-05 | Discharge: 2014-09-05 | Disposition: A | Discharge status: Home / Self Care | Payer: Commercial Managed Care - HMO | Attending: Emergency Medicine | Admitting: Emergency Medicine

## 2014-09-05 ENCOUNTER — Other Ambulatory Visit: Payer: Self-pay | Admitting: Internal Medicine

## 2014-09-05 ENCOUNTER — Encounter (HOSPITAL_COMMUNITY): Payer: Self-pay | Admitting: Cardiovascular Disease

## 2014-09-05 DIAGNOSIS — R4182 Altered mental status, unspecified: Secondary | ICD-10-CM

## 2014-09-05 DIAGNOSIS — M25551 Pain in right hip: Secondary | ICD-10-CM

## 2014-09-05 DIAGNOSIS — N183 Chronic kidney disease, stage 3 (moderate): Secondary | ICD-10-CM | POA: Insufficient documentation

## 2014-09-05 DIAGNOSIS — I129 Hypertensive chronic kidney disease with stage 1 through stage 4 chronic kidney disease, or unspecified chronic kidney disease: Secondary | ICD-10-CM | POA: Insufficient documentation

## 2014-09-05 DIAGNOSIS — Z79899 Other long term (current) drug therapy: Secondary | ICD-10-CM | POA: Diagnosis not present

## 2014-09-05 MED ORDER — ACETAMINOPHEN-CODEINE #3 300-30 MG PO TABS: 1.0000 | ORAL_TABLET | Freq: Four times a day (QID) | ORAL | Status: DC | PRN

## 2014-09-05 NOTE — ED Provider Notes (Signed)
History  This chart was scribed for non-physician practitioner, Domenic Moras, PA-C,working with Varney Biles, MD, by Marlowe Kays, ED Scribe. This patient was seen in room TR10C/TR10C and the patient's care was started at 4:32 PM.  Chief Complaint  Patient presents with  . Hip Pain   The history is provided by the patient and medical records. No language interpreter was used.    HPI Comments:  Donna [name] is a 79 y.o. female who presents to the Emergency Department complaining of moderate, sharp, stabbing, non-radiating, right hip pain that began two days ago. She states this is new for her and has never experienced this pain before. She has not taken anything for her pain. Movement makes the pain worse. Sitting or lying down helps to alleviate the pain. She notice a nodule to her R hip/buttock at the site of the pain.  Nodule only became painful with pressure or with movement. She denies any trauma, injury or fall. She denies light-headedness, dizziness, CP, fever, chills, rash, bowel or bladder changes, nausea or vomiting. She reports taking Eliquis daily for atrial fibrillation. She denies any abnormal bleeding. She has been ambulatory since the pain began. PCP is Dr. Mertha Finders.  Past Medical History  Diagnosis Date  . Essential hypertension   . Atherosclerosis of renal artery     a. s/p R RA stenting;  b. 04/2014 Renal Art duplex: Patent R RA stent, <60% L RA stenosis.  . Fibrocystic breast   . Fibromuscular dysplasia   . Carotid arterial disease     a. 01/2014 Carotid U/S: RICA 33%, LICA 35%.  . Atrial Fibrillation/Flutter     a. Dx 07/26/2014, CHA2DS2VASc = 5-->Eliquis;  c. 08/2014 Echo: EF 55-60%, no rwma, mildly dil LA/RA, mod-sev TR, PASP 50mmHg.  Marland Kitchen Hyperglycemia   . CKD (chronic kidney disease), stage III   . Tricuspid regurgitation   . Mitral regurgitation    Past Surgical History  Procedure Laterality Date  . Renal artery stent  2008    Right renal artery by Dr.  Drucie Opitz  . Hemorroidectomy    . Tubal ligation  1970's  . Tonsillectomy    . Cardioversion N/A 09/01/2014    Procedure: CARDIOVERSION;  Surgeon: Josue Hector, MD;  Location: [facility] ENDOSCOPY;  Service: Cardiovascular;  Laterality: N/A;   Family History  Problem Relation Age of Onset  . Stroke Mother 71  . Diabetes Mother   . Hypertension Mother   . Aneurysm Father     abdominal aortic aneurysm  . Diabetes Father   . Hypertension Father   . Cancer Sister 24    breast cancer   Social History  Substance Use Topics  . Smoking status: Never Smoker   . Smokeless tobacco: Never Used  . Alcohol Use: No   OB History    Gravida Para Term Preterm AB TAB SAB Ectopic Multiple Living   4 4 4  0 0 0 0 0 0 4     Review of Systems  Constitutional: Negative for fever and chills.  Cardiovascular: Negative for chest pain.  Gastrointestinal: Negative for nausea and vomiting.  Genitourinary:       No bowel or bladder incontinence  Musculoskeletal: Positive for arthralgias.  Skin: Negative for color change, rash and wound.  Neurological: Negative for dizziness, weakness, light-headedness and numbness.    Allergies  Tagamet  Home Medications   Prior to Admission medications   Medication Sig Start Date End Date Taking? Authorizing Provider  apixaban (ELIQUIS) 5  MG TABS tablet Take 1 tablet (5 mg total) by mouth 2 (two) times daily. 08/09/14   Rogelia Mire, NP  carvedilol (COREG) 12.5 MG tablet Take 1.5 tablets (18.75 mg total) by mouth 2 (two) times daily. 08/09/14   Rogelia Mire, NP  diltiazem (CARDIZEM CD) 120 MG 24 hr capsule Take 1 capsule (120 mg total) by mouth daily. Patient not taking: Reported on 08/31/2014 08/24/14   Dayna N Dunn, PA-C  ESTRACE VAGINAL 0.1 MG/GM vaginal cream Place 1 Applicatorful vaginally every 3 (three) days.  08/23/14   Historical Provider, MD  furosemide (LASIX) 20 MG tablet Take 20 mg by mouth daily.  08/18/14   Historical Provider, MD  Multiple  Vitamin (MULTIVITAMIN PO) Take 1 tablet by mouth daily.     Historical Provider, MD  NITROSTAT 0.4 MG SL tablet Place 0.4 mg under the tongue every 5 (five) minutes as needed for chest pain.     Historical Provider, MD   Triage Vitals: BP 142/70 mmHg  Pulse 72  Temp(Src) 97.8 F (36.6 C) (Oral)  Resp 14  Ht 5\' 1"  (1.549 m)  Wt 114 lb (51.71 kg)  BMI 21.55 kg/m2  SpO2 98% Physical Exam  Constitutional: She is oriented to person, place, and time. She appears well-developed and well-nourished.  HENT:  Head: Normocephalic and atraumatic.  Eyes: EOM are normal.  Neck: Normal range of motion.  Cardiovascular: Normal rate.   Brisk cap refill. Pedal pulses palpable.  Pulmonary/Chest: Effort normal.  Musculoskeletal: Normal range of motion.  Right paralumbar spinal tenderness. Tenderness to lumbosacral region. No foot drop. Tenderness to palpation to right lateral hip.  Neurological: She is alert and oriented to person, place, and time.  Patellar DT intact bilaterally. Bilateral lower extremities with 5/5 strength.  Skin: Skin is warm and dry. No rash noted.  Right gluteal region with a subcutaneous mobile nodule measuring 2 cm in diameter that is tender to palpation. No overlying skin changes. Skin without rash.  Psychiatric: She has a normal mood and affect. Her behavior is normal.  Nursing note and vitals reviewed.   ED Course  Procedures (including critical care time) DIAGNOSTIC STUDIES: Oxygen Saturation is 98% on RA, normal by my interpretation.   COORDINATION OF CARE: 4:40 PM- pt has a subcutaneous nodule to R hip/R gluteal region that is tender to palpation.  It does not appear to be an abscess, DVT or bony protuberance.  No hx of cancer.  This will need to be further evaluate by PCP.  Will prescribe Tylenol #3 and have patient follow up with PCP. Pt verbalizes understanding and agrees to plan.    MDM   Final diagnoses:  Right hip pain    BP 142/70 mmHg  Pulse 72   Temp(Src) 97.8 F (36.6 C) (Oral)  Resp 14  Ht 5\' 1"  (1.549 m)  Wt 114 lb (51.71 kg)  BMI 21.55 kg/m2  SpO2 98%   I personally performed the services described in this documentation, which was scribed in my presence. The recorded information has been reviewed and is accurate.    Domenic Moras, PA-C 09/05/14 1647  Varney Biles, MD 09/08/14 [phone]

## 2014-09-05 NOTE — ED Notes (Signed)
Pt c/o right hip pain, onset 2 days ago.  Pt denies any injury

## 2014-09-05 NOTE — Discharge Instructions (Signed)
You have a subcutaneous nodule in your right hip region.  Please follow up with your doctor for further evaluation.  In the mean time, take tylenol #3 as needed for pain.

## 2014-09-07 ENCOUNTER — Ambulatory Visit
Admission: RE | Admit: 2014-09-07 | Discharge: 2014-09-07 | Disposition: A | Discharge status: Home / Self Care | Payer: Commercial Managed Care - HMO | Source: Ambulatory Visit | Attending: Internal Medicine | Admitting: Internal Medicine

## 2014-09-07 ENCOUNTER — Ambulatory Visit: Payer: Commercial Managed Care - HMO | Admitting: Physician Assistant

## 2014-09-07 DIAGNOSIS — R4182 Altered mental status, unspecified: Secondary | ICD-10-CM

## 2014-09-09 ENCOUNTER — Other Ambulatory Visit: Payer: Commercial Managed Care - HMO

## 2014-09-27 ENCOUNTER — Encounter: Payer: Self-pay | Admitting: Physician Assistant

## 2014-09-27 NOTE — Progress Notes (Addendum)
Cardiology Office Note Date:  09/28/2014  Patient ID:  Donna [name] 10/01/1935, MRN [id number] PCP:  Henrine Screws, MD  Cardiologist: Dr. Linard Millers   Chief Complaint: f/u cardioversion  History of Present Illness: Donna [name] is a 79 y.o. female with history of hypertension, renal artery stenosis, moderate carotid arterial disease 01/2014, recently diagnosed paroxysmal atrial fibrillation/flutter who presents for follow-up.   With regard to recent history, her AF was diagnosed in July. She was supposed to be discharged with Xarelto and BB therapy but the patient didn't think she had ever took this. She was seen by Ignacia Bayley NP on 08/09/14 at which time she was started on Eliquis. The case has been discussed with Dr. Caryl Comes who felt that since she had atrial fib and atypical atrial flutter, she would not be an ablation candidate. The plan was to see back in 3-4 weeks to discuss DCCV. TSH WNL. 2D Echo 08/09/14: EF 55-60%, mild biatrial enlargement, mild MR, mod-severe TR, mild-mod elevation in pulm pressure. She was then seen in the ER 08/16/14 complaining of chest pain. Troponin was negative and chest pain was felt atypical by EDP thus she was discharged with plan for outpatient follow-up. ER notable for BUN/Cr 27/1.31, glucose 114, normal CBC. F/u labs 08/18/14 at PCP showed BUN 27/Cr 1.28, K 4.0, BNP 954. Dr. Tamala Julian felt the elevated BNP was likely 2/2 stress of the arrhythmia. She was seen in clinic 08/24/14 due to "feeling funny." She reported continued palpitations and decreased energy but overall still felt well. She was still in rapid atrial fib, HR 110-130s. Given recent chest pain, Lexiscan nuclear stress test was done 08/2014 which was negative for ischemia. Diltiazem was added and outpatient DCCV was planned. She underwent this 09/01/14 with conversion from AF 115 to SR 68bpm.  She comes in to clinic today feeling fine - says she is feeling "great!" She is back in atrial flutter with a  rate in the low 100s. She is completely unaware of this. No further CP, SOB, palpitations, LEE. She does notice she has lost a few pounds since last visit unintentionally. No bleeding issues.    Past Medical History  Diagnosis Date  . Essential hypertension   . Atherosclerosis of renal artery     a. s/p R RA stenting;  b. 04/2014 Renal Art duplex: Patent R RA stent, <60% L RA stenosis.  . Fibrocystic breast   . Fibromuscular dysplasia   . Carotid arterial disease     a. 01/2014 Carotid U/S: RICA 85%, LICA 46%.  . Atrial Fibrillation/Flutter     a. Dx 07/26/2014, CHA2DS2VASc = 5-->Eliquis;  c. 08/2014 Echo: EF 55-60%, no rwma, mildly dil LA/RA, mod-sev TR, PASP 61mmHg. b. s/p DCCV 08/2014.  Marland Kitchen Hyperglycemia   . CKD (chronic kidney disease), stage III   . Tricuspid regurgitation   . Mitral regurgitation     Past Surgical History  Procedure Laterality Date  . Renal artery stent  2008    Right renal artery by Dr. Drucie Opitz  . Hemorroidectomy    . Tubal ligation  1970's  . Tonsillectomy    . Cardioversion N/A 09/01/2014    Procedure: CARDIOVERSION;  Surgeon: Josue Hector, MD;  Location: [facility] ENDOSCOPY;  Service: Cardiovascular;  Laterality: N/A;    Current Outpatient Prescriptions  Medication Sig Dispense Refill  . acetaminophen-codeine (TYLENOL #3) 300-30 MG per tablet Take 1-2 tablets by mouth every 6 (six) hours as needed for moderate pain. 15 tablet 0  .  apixaban (ELIQUIS) 5 MG TABS tablet Take 1 tablet (5 mg total) by mouth 2 (two) times daily. 60 tablet 5  . carvedilol (COREG) 12.5 MG tablet Take 1.5 tablets (18.75 mg total) by mouth 2 (two) times daily. 45 tablet 5  . diltiazem (CARDIZEM CD) 120 MG 24 hr capsule Take 120 mg by mouth daily.    Marland Kitchen ESTRACE VAGINAL 0.1 MG/GM vaginal cream Place 1 Applicatorful vaginally every 3 (three) days.     . furosemide (LASIX) 20 MG tablet Take 20 mg by mouth daily.     . Multiple Vitamin (MULTIVITAMIN PO) Take 1 tablet by mouth daily.     Marland Kitchen  NITROSTAT 0.4 MG SL tablet Place 0.4 mg under the tongue every 5 (five) minutes as needed for chest pain.      No current facility-administered medications for this visit.    Allergies:   Tagamet   Social History:  The patient  reports that she has never smoked. She has never used smokeless tobacco. She reports that she does not drink alcohol or use illicit drugs.   Family History:  The patient's family history includes Aneurysm in her father; Cancer (age of onset: 24) in her sister; Diabetes in her father and mother; Hypertension in her father and mother; Stroke (age of onset: 53) in her mother.  ROS:  Please see the history of present illness.  All other systems are reviewed and otherwise negative.   PHYSICAL EXAM:  VS:  BP 126/84 mmHg  Pulse 104  Ht 5\' 1"  (1.549 m)  Wt 106 lb 1.9 oz (48.136 kg)  BMI 20.06 kg/m2 BMI: Body mass index is 20.06 kg/(m^2). Thin, well developed AAF, in no acute distress HEENT: normocephalic, atraumatic Neck: no JVD, carotid bruits or masses Cardiac:  Irregularly irregular, mildly elevated rate; no murmurs, rubs, or gallops Lungs:  clear to auscultation bilaterally, no wheezing, rhonchi or rales Abd: soft, nontender, no hepatomegaly, + BS MS: no deformity or atrophy Ext: no edema Skin: warm and dry, no rash Neuro:  moves all extremities spontaneously, no focal abnormalities noted, follows commands Psych: euthymic mood, full affect   EKG:  Done today shows atrial flutter 104bpm, low voltage QRS, possible prior anterolateral infarct, nonspecific ST-T changes  Recent Labs: 07/25/2014: ALT 48 08/09/2014: TSH 0.58 08/16/2014: BUN 27*; Creatinine, Ser 1.31*; Hemoglobin 13.5; Platelets 183; Potassium 3.8; Sodium 140  No results found for requested labs within last 365 days.   CrCl cannot be calculated (Patient has no serum creatinine result on file.).   Wt Readings from Last 3 Encounters:  09/28/14 106 lb 1.9 oz (48.136 kg)  09/05/14 114 lb (51.71 kg)    09/01/14 115 lb (52.164 kg)     Other studies reviewed: Additional studies/records reviewed today include: summarized above  ASSESSMENT AND PLAN:  1. Paroxysmal atrial fibrillation/paroxysmal atrial flutter - s/p DCCV. She is back in atrial flutter today but with improved rate compared to prior. She is completely asymptomatic. We discussed strategies of rhythm control vs rate control. At this time she has elected rate control since she feels fine in this rhythm. She is supposed to be on Diltiazem but she has her husband manage her pills. He did not recognize this medication. We have asked them to check as soon as they get home - if she is not on it, we will plan to start it at 120mg  daily. If they confirm she has been taking it, we will increase dose to 180mg  daily. I asked her to follow  HR at home and call if running >100 after our med change or to let us know if she develops symptoms. She is tolerating Eliquis well. Of note the patient thought she was on amlodipine still but the patient's husband confirmed she is not. 2. Essential HTN  - follow. 3. CKD stage III - will recheck BMET today. This will need to be followed periodically because if this goes >1.5, her Eliquis dose will need to be adjusted. When she turns 82 it will definitely need to be adjusted given wt <60kg. 4. Valvular heart disease - mod-severe TR, mild MR by recent echo. Follow clinically. 5. Carotid disease and renal artery disease - followed by [facility].   Disposition: F/u with Dr. Tamala Julian in 3 months. I also asked her to discuss recent weight loss with her primary care doctor. Note recent TSH was normal so other causes may need to be investigated.  Current medicines are reviewed at length with the patient today.  The patient did not have any concerns regarding medicines.  Raechel Ache PA-C 09/28/2014 9:41 AM     [address] [place] [address] [place] [id number] [phone] (office)  [phone] (fax)

## 2014-09-28 ENCOUNTER — Other Ambulatory Visit: Payer: Self-pay | Admitting: *Deleted

## 2014-09-28 ENCOUNTER — Ambulatory Visit (INDEPENDENT_AMBULATORY_CARE_PROVIDER_SITE_OTHER): Payer: Commercial Managed Care - HMO | Admitting: Physician Assistant

## 2014-09-28 ENCOUNTER — Encounter: Payer: Self-pay | Admitting: Physician Assistant

## 2014-09-28 ENCOUNTER — Telehealth: Payer: Self-pay | Admitting: *Deleted

## 2014-09-28 VITALS — BP 126/84 | HR 104 | Ht 61.0 in | Wt 106.1 lb

## 2014-09-28 DIAGNOSIS — I739 Peripheral vascular disease, unspecified: Secondary | ICD-10-CM

## 2014-09-28 DIAGNOSIS — I480 Paroxysmal atrial fibrillation: Secondary | ICD-10-CM | POA: Diagnosis not present

## 2014-09-28 DIAGNOSIS — I10 Essential (primary) hypertension: Secondary | ICD-10-CM

## 2014-09-28 DIAGNOSIS — I38 Endocarditis, valve unspecified: Secondary | ICD-10-CM

## 2014-09-28 DIAGNOSIS — I4892 Unspecified atrial flutter: Secondary | ICD-10-CM | POA: Diagnosis not present

## 2014-09-28 DIAGNOSIS — N183 Chronic kidney disease, stage 3 unspecified: Secondary | ICD-10-CM

## 2014-09-28 LAB — BASIC METABOLIC PANEL
BUN: 22 mg/dL (ref 6–23)
CHLORIDE: 107 meq/L (ref 96–112)
CO2: 25 meq/L (ref 19–32)
CREATININE: 0.88 mg/dL (ref 0.40–1.20)
Calcium: 9.6 mg/dL (ref 8.4–10.5)
GFR: 80.1 mL/min (ref >60.00)
GLUCOSE: 119 mg/dL — AB (ref 70–99)
Potassium: 3.5 mEq/L (ref 3.5–5.1)
Sodium: 140 mEq/L (ref 135–145)

## 2014-09-28 MED ORDER — DILTIAZEM HCL ER COATED BEADS 180 MG PO CP24: 180.0000 mg | ORAL_CAPSULE | Freq: Every day | ORAL | Status: DC

## 2014-09-28 MED ORDER — POTASSIUM CHLORIDE CRYS ER 20 MEQ PO TBCR: 20.0000 meq | EXTENDED_RELEASE_TABLET | Freq: Every day | ORAL | Status: AC

## 2014-09-28 NOTE — Telephone Encounter (Signed)
Called pt and spoke with her and her spouse, Donna [name].  They was supposed to call us back once they returned home from their visit this morning to confirm whether or not the pt was taking Cardizem.  We received her labs back as well, so I went ahead and called and let them know that her labs were good.  Her Kidney function was back to normal, but her Potassium was low so we added 20 meq Potassium for her to start taking 1 X daily.  They was also advised that pt's sugar was still elevated and they needed to contact her PCP re: that.   They both verbalized understanding and appreciation and confirmed that both rx's was called into [facility].Marland Kitchen

## 2014-09-28 NOTE — Patient Instructions (Signed)
Medication Instructions:  Please check your medications when you get home. Call us to let us know if you are taking Diltiazem or not (other name is Cardizem). If you are not, we plan to start this medicine. If you are, we will plan to increase the dose. Call us as soon as you find out.  Labwork: TODAY:  BMET  Testing/Procedures: None ordered  Follow-Up: Your physician recommends that you schedule a follow-up appointment in: [place]   Any Other Special Instructions Will Be Listed Below (If Applicable).  Please follow up with your Primary Care Physician for your weight loss.

## 2014-09-28 NOTE — Progress Notes (Signed)
Quick Note:  Please let patient know good news - kidney function now normal.  Blood sugar still running a bit high - needs to f/u with PCP to monitor for diabetes. Potassium level could be a little better but this may be due to Lasix - please add KCl 64meq daily. Also - when the patient checked in, her med list populated amlodipine from outside pharmacy. She and her husband confirmed she is NOT taking this medicine anymore. This ended up on her AVS. However, she was going to let us know this morning when she got home if she was taking Diltiazem or not (see office note). Please make patient aware that she should NOT be taking amlodipine, and find out if she has the diltiazem at home or not. Thank you! Dayna Dunn PA-C  ______

## 2014-09-28 NOTE — Addendum Note (Signed)
Addended by: Charlie Pitter on: 09/28/2014 02:07 PM   Modules accepted: Orders, Medications

## 2014-09-28 NOTE — Telephone Encounter (Signed)
-----   Message from Charlie Pitter, [place] sent at 09/28/2014  2:10 PM EDT ----- (I removed amlodipine from the med list and added the diltiazem she thought she was taking back for now, pending their further update.) Melina Copa PA-C

## 2014-11-06 ENCOUNTER — Emergency Department (HOSPITAL_COMMUNITY)
Admission: EM | Admit: 2014-11-06 | Discharge: 2014-11-06 | Disposition: A | Discharge status: Home / Self Care | Payer: Commercial Managed Care - HMO | Attending: Emergency Medicine | Admitting: Emergency Medicine

## 2014-11-06 ENCOUNTER — Encounter (HOSPITAL_COMMUNITY): Payer: Self-pay | Admitting: Emergency Medicine

## 2014-11-06 DIAGNOSIS — N183 Chronic kidney disease, stage 3 (moderate): Secondary | ICD-10-CM | POA: Insufficient documentation

## 2014-11-06 DIAGNOSIS — I480 Paroxysmal atrial fibrillation: Secondary | ICD-10-CM | POA: Diagnosis not present

## 2014-11-06 DIAGNOSIS — R600 Localized edema: Secondary | ICD-10-CM | POA: Insufficient documentation

## 2014-11-06 DIAGNOSIS — Z8742 Personal history of other diseases of the female genital tract: Secondary | ICD-10-CM | POA: Insufficient documentation

## 2014-11-06 DIAGNOSIS — I129 Hypertensive chronic kidney disease with stage 1 through stage 4 chronic kidney disease, or unspecified chronic kidney disease: Secondary | ICD-10-CM | POA: Insufficient documentation

## 2014-11-06 DIAGNOSIS — Z79899 Other long term (current) drug therapy: Secondary | ICD-10-CM | POA: Diagnosis not present

## 2014-11-06 DIAGNOSIS — Z7902 Long term (current) use of antithrombotics/antiplatelets: Secondary | ICD-10-CM | POA: Diagnosis not present

## 2014-11-06 DIAGNOSIS — R5383 Other fatigue: Secondary | ICD-10-CM | POA: Diagnosis present

## 2014-11-06 LAB — CBC WITH DIFFERENTIAL/PLATELET
BASOS PCT: 1 %
Basophils Absolute: 0.1 10*3/uL (ref 0.0–0.1)
EOS ABS: 0.1 10*3/uL (ref 0.0–0.7)
Eosinophils Relative: 2 %
HCT: 38.3 % (ref 36.0–46.0)
HEMOGLOBIN: 12.9 g/dL (ref 12.0–15.0)
LYMPHS ABS: 1 10*3/uL (ref 0.7–4.0)
Lymphocytes Relative: 25 %
MCH: 31.6 pg (ref 26.0–34.0)
MCHC: 33.7 g/dL (ref 30.0–36.0)
MCV: 93.9 fL (ref 78.0–100.0)
MONO ABS: 0.5 10*3/uL (ref 0.1–1.0)
MONOS PCT: 13 %
NEUTROS PCT: 59 %
Neutro Abs: 2.4 10*3/uL (ref 1.7–7.7)
Platelets: 155 10*3/uL (ref 150–400)
RBC: 4.08 MIL/uL (ref 3.87–5.11)
RDW: 16.7 % — AB (ref 11.5–15.5)
WBC: 4.1 10*3/uL (ref 4.0–10.5)

## 2014-11-06 LAB — URINALYSIS, ROUTINE W REFLEX MICROSCOPIC
Bilirubin Urine: NEGATIVE
GLUCOSE, UA: NEGATIVE mg/dL
Hgb urine dipstick: NEGATIVE
Ketones, ur: NEGATIVE mg/dL
LEUKOCYTES UA: NEGATIVE
NITRITE: NEGATIVE
PH: 6.5 (ref 5.0–8.0)
Protein, ur: NEGATIVE mg/dL
SPECIFIC GRAVITY, URINE: 1.008 (ref 1.005–1.030)
Urobilinogen, UA: 0.2 mg/dL (ref 0.0–1.0)

## 2014-11-06 LAB — BRAIN NATRIURETIC PEPTIDE: B Natriuretic Peptide: 927 pg/mL — ABNORMAL HIGH (ref 0.0–100.0)

## 2014-11-06 LAB — I-STAT TROPONIN, ED: TROPONIN I, POC: 0 ng/mL (ref 0.00–0.08)

## 2014-11-06 LAB — BASIC METABOLIC PANEL
ANION GAP: 13 (ref 5–15)
BUN: 16 mg/dL (ref 6–20)
CO2: 22 mmol/L (ref 22–32)
Calcium: 9.8 mg/dL (ref 8.9–10.3)
Chloride: 105 mmol/L (ref 101–111)
Creatinine, Ser: 0.95 mg/dL (ref 0.44–1.00)
GFR, EST NON AFRICAN AMERICAN: 56 mL/min — AB (ref >60)
Glucose, Bld: 110 mg/dL — ABNORMAL HIGH (ref 65–99)
POTASSIUM: 3.7 mmol/L (ref 3.5–5.1)
SODIUM: 140 mmol/L (ref 135–145)

## 2014-11-06 NOTE — Discharge Instructions (Signed)
All of your lab work looked fine today. I have scheduled you a follow up appointment with your Cardiologist on 11/8 at 10:00AM.   Atrial Fibrillation Atrial fibrillation is a type of heartbeat that is irregular or fast (rapid). If you have this condition, your heart keeps quivering in a weird (chaotic) way. This condition can make it so your heart cannot pump blood normally. Having this condition gives a person more risk for stroke, heart failure, and other heart problems. There are different types of atrial fibrillation. Talk with your doctor to learn about the type that you have. HOME CARE  Take over-the-counter and prescription medicines only as told by your doctor.  If your doctor prescribed a blood-thinning medicine, take it exactly as told. Taking too much of it can cause bleeding. If you do not take enough of it, you will not have the protection that you need against stroke and other problems.  Do not use any tobacco products. These include cigarettes, chewing tobacco, and e-cigarettes. If you need help quitting, ask your doctor.  If you have apnea (obstructive sleep apnea), manage it as told by your doctor.  Do not drink alcohol.  Do not drink beverages that have caffeine. These include coffee, soda, and tea.  Maintain a healthy weight. Do not use diet pills unless your doctor says they are safe for you. Diet pills may make heart problems worse.  Follow diet instructions as told by your doctor.  Exercise regularly as told by your doctor.  Keep all follow-up visits as told by your doctor. This is important. GET HELP IF:  You notice a change in the speed, rhythm, or strength of your heartbeat.  You are taking a blood-thinning medicine and you notice more bruising.  You get tired more easily when you move or exercise. GET HELP RIGHT AWAY IF:  You have pain in your chest or your belly (abdomen).  You have sweating or weakness.  You feel sick to your stomach  (nauseous).  You notice blood in your throw up (vomit), poop (stool), or pee (urine).  You are short of breath.  You suddenly have swollen feet and ankles.  You feel dizzy.  Your suddenly get weak or numb in your face, arms, or legs, especially if it happens on one side of your body.  You have trouble talking, trouble understanding, or both.  Your face or your eyelid droops on one side. These symptoms may be an emergency. Do not wait to see if the symptoms will go away. Get medical help right away. Call your local emergency services (911 in the [place] Do not drive yourself to the hospital.   This information is not intended to replace advice given to you by your health care provider. Make sure you discuss any questions you have with your health care provider.   Document Released: 10/02/2007 Document Revised: 09/13/2014 Document Reviewed: 04/19/2014 Elsevier Interactive Patient Education [employer].

## 2014-11-06 NOTE — ED Notes (Signed)
Patient states has been really tired the last few days.   Patient states has bilateral foot swelling x 10 days.   Patient denies other symptoms.    Patient states she had been taking some new medications and started having problems about a month ago.

## 2014-11-06 NOTE — ED Provider Notes (Signed)
CSN: [id number]     Arrival date & time 11/06/14  0737 History   First MD Initiated Contact with Patient 11/06/14 [phone]     Chief Complaint  Patient presents with  . Fatigue  . Foot Swelling   (Consider location/radiation/quality/duration/timing/severity/associated sxs/prior Treatment) The history is provided by the patient and the spouse.   [name] is a 79 year old female with a history of HTN, PAF, CAD presenting to the ED today with a several week history of generalized fatigue. She reports that over the past several weeks she has had no energy and fatigued. The fatigue is diffuse with no focal weakness. She denies any shortness of breath, chest pain, headache, abdominal pain, dysuria, diarrhea, fevers or chills. She does reports bilateral ankle swelling for the past 2 weeks that she reports she has never had before. Does note some occasional palpations.    Past Medical History  Diagnosis Date  . Essential hypertension   . Atherosclerosis of renal artery (HCC)     a. s/p R RA stenting;  b. 04/2014 Renal Art duplex: Patent R RA stent, <60% L RA stenosis.  . Fibrocystic breast   . Fibromuscular dysplasia ([place])   . Carotid arterial disease ([place])     a. 01/2014 Carotid U/S: RICA 69%, LICA 48%.  . Atrial Fibrillation/Flutter     a. Dx 07/26/2014, CHA2DS2VASc = 5-->Eliquis;  c. 08/2014 Echo: EF 55-60%, no rwma, mildly dil LA/RA, mod-sev TR, PASP 53mmHg. b. s/p DCCV 08/2014.  Marland Kitchen Hyperglycemia   . CKD (chronic kidney disease), stage III   . Tricuspid regurgitation   . Mitral regurgitation    Past Surgical History  Procedure Laterality Date  . Renal artery stent  2008    Right renal artery by Dr. Drucie Opitz  . Hemorroidectomy    . Tubal ligation  1970's  . Tonsillectomy    . Cardioversion N/A 09/01/2014    Procedure: CARDIOVERSION;  Surgeon: Josue Hector, MD;  Location: [facility] ENDOSCOPY;  Service: Cardiovascular;  Laterality: N/A;   Family History  Problem Relation Age of Onset  .  Stroke Mother 107  . Diabetes Mother   . Hypertension Mother   . Aneurysm Father     abdominal aortic aneurysm  . Diabetes Father   . Hypertension Father   . Cancer Sister 22    breast cancer   Social History  Substance Use Topics  . Smoking status: Never Smoker   . Smokeless tobacco: Never Used  . Alcohol Use: No   OB History    Gravida Para Term Preterm AB TAB SAB Ectopic Multiple Living   4 4 4  0 0 0 0 0 0 4     Review of Systems  Constitutional: Positive for fatigue. Negative for fever and chills.  HENT: Negative.   Eyes: Negative.   Respiratory: Negative.   Cardiovascular: Positive for palpitations and leg swelling.  Gastrointestinal: Negative.   Endocrine: Negative.   Genitourinary: Negative.   Musculoskeletal: Negative.   Skin: Negative.   Allergic/Immunologic: Negative.   Neurological: Negative.   Hematological: Negative.   Psychiatric/Behavioral: Negative.    Allergies  Tagamet  Home Medications   Prior to Admission medications   Medication Sig Start Date End Date Taking? Authorizing Provider  acetaminophen-codeine (TYLENOL #3) 300-30 MG per tablet Take 1-2 tablets by mouth every 6 (six) hours as needed for moderate pain. 09/05/14  Yes Domenic Moras, PA-C  apixaban (ELIQUIS) 5 MG TABS tablet Take 1 tablet (5 mg total) by mouth  2 (two) times daily. 08/09/14  Yes Rogelia Mire, NP  carvedilol (COREG) 12.5 MG tablet Take 1.5 tablets (18.75 mg total) by mouth 2 (two) times daily. 08/09/14  Yes Rogelia Mire, NP  diltiazem (CARDIZEM CD) 180 MG 24 hr capsule Take 1 capsule (180 mg total) by mouth daily. 09/28/14  Yes Dayna N Dunn, PA-C  ESTRACE VAGINAL 0.1 MG/GM vaginal cream Place 1 Applicatorful vaginally every Monday, Wednesday, and Friday.  08/23/14  Yes Historical Provider, MD  furosemide (LASIX) 20 MG tablet Take 20 mg by mouth every Monday, Wednesday, and Friday.  08/18/14  Yes Historical Provider, MD  Multiple Vitamin (MULTIVITAMIN PO) Take 1 tablet by  mouth daily.    Yes Historical Provider, MD  potassium chloride SA (KLOR-CON M20) 20 MEQ tablet Take 1 tablet (20 mEq total) by mouth daily. 09/28/14  Yes Dayna N Dunn, PA-C  NITROSTAT 0.4 MG SL tablet Place 0.4 mg under the tongue every 5 (five) minutes as needed for chest pain.     Historical Provider, MD   BP 138/82 mmHg  Pulse 58  Temp(Src) 97.5 F (36.4 C) (Oral)  Resp 18  Ht 5\' 1"  (1.549 m)  Wt 105 lb (47.628 kg)  BMI 19.85 kg/m2  SpO2 96% Physical Exam  Constitutional: She is oriented to person, place, and time. She appears well-developed and well-nourished. No distress.  HENT:  Head: Normocephalic and atraumatic.  Mouth/Throat: No oropharyngeal exudate.  Eyes: Conjunctivae and EOM are normal. Pupils are equal, round, and reactive to light.  Neck: Normal range of motion. Neck supple.  Cardiovascular: Normal rate.  An irregularly irregular rhythm present.  Pulmonary/Chest: Effort normal and breath sounds normal. She has no wheezes. She has no rales.  Abdominal: Soft. Bowel sounds are normal. There is no tenderness.  Musculoskeletal: Normal range of motion. She exhibits edema.  Neurological: She is oriented to person, place, and time.  Skin: Skin is warm and dry.  Psychiatric: She has a normal mood and affect.    ED Course  Procedures (including critical care time) Labs Review Labs Reviewed  BASIC METABOLIC PANEL  BRAIN NATRIURETIC PEPTIDE  CBC WITH DIFFERENTIAL/PLATELET  URINALYSIS, ROUTINE W REFLEX MICROSCOPIC (NOT AT [facility])  I-STAT TROPOININ, ED    Imaging Review No results found. I have personally reviewed and evaluated these images and lab results as part of my medical decision-making.   EKG Interpretation None      MDM   Final diagnoses:  None    Patient with complaints of generalized fatigue for the past 3-4 weeks. She is irregularly irregular on exam but rate controlled 60-90 bpm. Does have some mild lower extremity edema at the ankles that is  symmetrical. No JVD. TSH in 08/2014 was wnl.  Will check CBC to r/o anemia (on Eliquis), UA, basic labs.   CBC and BMP unremarkable. Troponin negative x 1. EKG with A. Fib. BNP elevated at 927. UA clear. Vitals are stable.  Etiology unclear at this point. Most likely related to her A. Fib. She is on a BB but reports she has been on one for years. She will follow up with her Cardiologist's office on 11/8 for further management. Discharge today in stable condition.  Maryellen Pile, MD 11/06/14 [place], MD 11/08/14 [phone]

## 2014-11-06 NOTE — ED Provider Notes (Signed)
The patient is 80 years old, she has a history of atrial fibrillation, she is on a beta blocker and anticoagulation, states she has had gradual onset of persistent fatigue, this is generalized, she cannot specifically say where she is weak, she has no chest pain shortness of breath headache abdominal pain dysuria diarrhea or rectal bleeding. She does have some swelling in her bilateral ankles which she states is new and which she states she has never had before.  On exam she has a soft nontender abdomen, clear lung sounds, no rales wheezing or rhonchi, irregularly irregular rhythm which is rate controlled between 60 and 80 bpm. She has minimal lower extremity edema at the ankles bilaterally, this is symmetrical. Oropharynx is clear and moist, phonation is normal, no swelling of the neck, no JVD  Etiology of the fatigue is unclear, check CBC to rule out anemia as she is anticoagulated, urinalysis, basic labs including a BNP.   EKG Interpretation  Date/Time:  Monday November 06 2014 11:00:27 EDT Ventricular Rate:  103 PR Interval:    QRS Duration: 96 QT Interval:  406 QTC Calculation: 531 R Axis:   131 Text Interpretation:  Atrial fibrillation Paired ventricular premature complexes Anterior infarct, old ED PHYSICIAN INTERPRETATION AVAILABLE IN [place] Confirmed by TEST, Record ([id number]) on 11/07/2014 7:12:49 AM      I saw and evaluated the patient, reviewed the resident's note and I agree with the findings and plan.  Please see my separate note regarding my evaluation of the patient.     Noemi Chapel, MD 11/08/14 [phone]

## 2014-11-09 ENCOUNTER — Other Ambulatory Visit: Payer: Self-pay | Admitting: Nurse Practitioner

## 2014-11-12 ENCOUNTER — Encounter: Payer: Self-pay | Admitting: Physician Assistant

## 2014-11-12 NOTE — Progress Notes (Signed)
Cardiology Office Note Date:  11/14/2014  Patient ID:  [name], [name] 05/22/1935, MRN [id number] PCP:  Donna Screws, MD  Cardiologist:  Donna [name]   Chief Complaint: fatigue  History of Present Illness: Donna [name] is a 79 y.o. female with history of hypertension, renal artery stenosis as below (followed by [facility]), moderate carotid arterial disease 01/2014 (followed by [facility]), paroxysmal atrial fibrillation/flutter, probable CKD stage II-III, valvular heart disease (mod-severe TR and mild MR by echo 08/2014) who presents for ER follow-up.   With regard to recent history, her AF was diagnosed in July 2016. She was supposed to be discharged with Xarelto and BB therapy but the patient didn't recall taking this. She was seen back in August and was started on Eliquis. The case was discussed with Dr. Caryl [name] who felt that since she had atrial fib and atypical atrial flutter, she would not be an ablation candidate. The plan was to bring her back in for DCCV in 3-4 weeks. 2D Echo 08/09/14: EF 55-60%, mild biatrial enlargement, mild MR, mod-severe TR, mild-mod elevation in pulm pressure. She was then seen in the ER 08/16/14 complaining of chest pain. Troponin was negative and chest pain was felt atypical by EDP thus she was discharged with plan for outpatient follow-up. Lexiscan nuclear stress test was done 08/2014 which was negative for ischemia. Diltiazem was added for elevated rate and she underwent DCCV 09/01/14. When seen back in the office on 09/27/14, she was back in atrial flutter with HR low 100s but was completely asymptomatic. Strategies of rhythm vs rate control were discussed with the patient and in the absence of symptoms she elected to continue rate control. Diltiazem was further increased (but see below - patient never took the higher dose). She had also reported recent progressive weight loss (in setting of normal TSH) and was advised to discuss with PCP. She was seen in the ER 11/06/14 with  general fatigue but no focal abnormalities. She was in atrial fib at that time, with controlled HR 60-90BPM. Labs notable for neg UA, Cr 0.95, K 3.7, glucose 110, Hgb 12.9, troponin negative, BNP 927. She was asked to f/u here but again presented back again to the ER 11/13/14 with generalized weakness. She again was in rate controlled atrial fib with reassuring vitals. BNP 820, Hgb again normal, glucose 140, BUN/Cr 24/1.00. CXR showed small bilateral effusions with bibasilar atelectasis or infiltrates. She was treated with IV Lasix 20mg  x 1 and discharged home.  She [name] back in with her family today. Although her husband is managing her medications I am not certain they have a good grasp on what she should actually be taking. (I.E. The last time she was in, there was confusion about her being on both amlodipine and diltiazem that we had to get sorted out.) This visit they inform me she apparently has not been taking the higher dose of diltiazem at all. Her husband thought this was the same as carvedilol so she hasn't been taking this medicine recently. Their daughter says they tend to keep the old pill bottles which likely confounds the confusion. The patient again describes a sensation of overall fatigue and possible decreased exercise tolerance. No chest pain or exertional dyspnea noted.   Past Medical History  Diagnosis Date  . Essential hypertension   . Atherosclerosis of renal artery (HCC)     a. s/p R RA stenting;  b. 04/2014 Renal Art duplex: Patent R RA stent, <60% L RA stenosis. Followed by  [facility].  . Fibrocystic breast   . Fibromuscular dysplasia ([place])   . Carotid arterial disease ([place])     a. 01/2014 Carotid U/S: RICA 85%, LICA 92%. Followed by [facility].  . Paroxysmal atrial fibrillation ([place])     a. Dx 07/26/2014, CHA2DS2VASc = 5-->Eliquis;  c. 08/2014 Echo: EF 55-60%, no rwma, mildly dil LA/RA, mod-sev TR, PASP 81mmHg. b. s/p DCCV 08/2014. c. back in atrial flutter 09/2014 -> rate control pursued.  .  Paroxysmal atrial flutter ([place])     a. Dx 07/26/2014, CHA2DS2VASc = 5-->Eliquis;  c. 08/2014 Echo: EF 55-60%, no rwma, mildly dil LA/RA, mod-sev TR, PASP 29mmHg. b. s/p DCCV 08/2014. c. back in atrial flutter 09/2014 -> rate control pursued.  . CKD (chronic kidney disease), stage III     Stage II/III  . Tricuspid regurgitation     a. Echo 08/2014: mod-severe.  . Mitral regurgitation     a. Echo 08/2014: mild MR.  Marland Kitchen Hyperglycemia     Past Surgical History  Procedure Laterality Date  . Renal artery stent  2008    Right renal artery by Dr. Drucie [name]  . Hemorroidectomy    . Tubal ligation  1970's  . Tonsillectomy    . Cardioversion N/A 09/01/2014    Procedure: CARDIOVERSION;  Surgeon: Donna Hector, MD;  Location: [facility] ENDOSCOPY;  Service: Cardiovascular;  Laterality: N/A;    Current Outpatient Prescriptions  Medication Sig Dispense Refill  . acetaminophen-codeine (TYLENOL #3) 300-30 MG per tablet Take 1-2 tablets by mouth every 6 (six) hours as needed for moderate pain. 15 tablet 0  . apixaban (ELIQUIS) 5 MG TABS tablet Take 1 tablet (5 mg total) by mouth 2 (two) times daily. 60 tablet 5  . B Complex-C (SUPER B COMPLEX PO) Take 1 tablet by mouth daily.    . carvedilol (COREG) 12.5 MG tablet Take 1.5 tablets (18.75 mg total) by mouth 2 (two) times daily. 90 tablet 10  . ESTRACE VAGINAL 0.1 MG/GM vaginal cream Place 1 Applicatorful vaginally every Monday, Wednesday, and Friday.     . furosemide (LASIX) 20 MG tablet Take 20 mg by mouth every Monday, Wednesday, and Friday.     . Multiple Vitamin (MULTIVITAMIN PO) Take 1 tablet by mouth daily.     . nitroGLYCERIN (NITROSTAT) 0.4 MG SL tablet Place 1 tablet (0.4 mg total) under the tongue every 5 (five) minutes as needed for chest pain. 25 tablet 3  . potassium chloride SA (KLOR-CON M20) 20 MEQ tablet Take 1 tablet (20 mEq total) by mouth daily. 90 tablet 3  . diltiazem (CARDIZEM CD) 180 MG 24 hr capsule Take 1 capsule (180 mg total) by mouth daily.  90 capsule 3   No current facility-administered medications for this visit.    Allergies:   Tagamet   Social History:  The patient  reports that she has never smoked. She has never used smokeless tobacco. She reports that she does not drink alcohol or use illicit drugs.   Family History:  The patient's family history includes Aneurysm in her father; Cancer (age of onset: 37) in her sister; Diabetes in her father and mother; Heart attack in her father; Hypertension in her father and mother; Stroke (age of onset: 68) in her mother.  ROS:  Please see the history of present illness.  All other systems are reviewed and otherwise negative.   PHYSICAL EXAM:  VS:  BP 130/70 mmHg  Pulse 92  Ht 5\' 1"  (1.549 m)  Wt 108 lb (  48.988 kg)  BMI 20.42 kg/m2 BMI: Body mass index is 20.42 kg/(m^2). Well developed thin AAF, in no acute distress HEENT: normocephalic, atraumatic Neck: no JVD, carotid bruits or masses Cardiac:  Irregularly irregular, no murmurs, rubs, or gallops Lungs:  clear to auscultation bilaterally, no wheezing, rhonchi or rales Abd: soft, nontender, no hepatomegaly, + BS MS: no deformity or atrophy Ext: minimal ankle edema Skin: warm and dry, no rash Neuro:  moves all extremities spontaneously, no focal abnormalities noted, follows commands Psych: euthymic mood, full affect  EKG:  Done today shows atrial fib 92bpm, nonspeciifc T wave changes  Recent Labs: 07/25/2014: ALT 48 08/09/2014: TSH 0.58 11/13/2014: B Natriuretic Peptide 820.6*; BUN 24*; Creatinine, Ser 1.00; Hemoglobin 13.4; Platelets 167; Potassium 3.5; Sodium 139  No results found for requested labs within last 365 days.   Estimated Creatinine Clearance: 35.6 mL/min (by C-G formula based on Cr of 1).   Wt Readings from Last 3 Encounters:  11/14/14 108 lb (48.988 kg)  11/06/14 105 lb (47.628 kg)  09/28/14 106 lb 1.9 oz (48.136 kg)     Other studies reviewed: Additional studies/records reviewed today include:  summarized above  ASSESSMENT AND PLAN:  1. Fatigue - tricky to know if this is related to her atrial arrhythmias because she felt completely fine when last seen in 09/2014 when she was back in atrial flutter at that time with elevated rates. However, I have a suspicion that symptoms arose around the time when she ran out of the Diltiazem 120mg  tablets and she never started the 180mg  tablets as recommended. Will resume 180mg  daily and follow symptoms. I reviewed process of med reconciliation with the patient, her husband, and daughter. Will refer to AF clinic for recheck in 1 week to consider antiarrhythmic therapy if no improvement. 2. Chronic diastolic CHF - suspect this will improve with better rate control. Continue Lasix, potassium. Follow. 3. PAF/paroxysmal atrial flutter - See above. Continue Eliquis. When she turns 82 it will definitely need to be adjusted given wt <60kg. 4. Essential HTN - follow with addition of med above. 5. CKD stage II-III - creatinine earlier this year when feeling well was 1.2-1.3, stable recently in the ER.  Disposition: F/u with Roderic Palau in AF clinic in 1 week.  Current medicines are reviewed at length with the patient today.  The patient did not have any concerns regarding medicines.  Raechel Ache PA-C 11/14/2014 10:53 AM     [facility] [place] [address] [phone] (office)  [phone] (fax)

## 2014-11-13 ENCOUNTER — Emergency Department (HOSPITAL_COMMUNITY)
Admission: EM | Admit: 2014-11-13 | Discharge: 2014-11-13 | Disposition: A | Discharge status: Home / Self Care | Payer: Commercial Managed Care - HMO | Attending: Emergency Medicine | Admitting: Emergency Medicine

## 2014-11-13 ENCOUNTER — Emergency Department (HOSPITAL_COMMUNITY): Payer: Commercial Managed Care - HMO

## 2014-11-13 ENCOUNTER — Encounter (HOSPITAL_COMMUNITY): Payer: Self-pay | Admitting: *Deleted

## 2014-11-13 DIAGNOSIS — I4891 Unspecified atrial fibrillation: Secondary | ICD-10-CM | POA: Diagnosis present

## 2014-11-13 DIAGNOSIS — N183 Chronic kidney disease, stage 3 (moderate): Secondary | ICD-10-CM | POA: Insufficient documentation

## 2014-11-13 DIAGNOSIS — R531 Weakness: Secondary | ICD-10-CM | POA: Diagnosis not present

## 2014-11-13 DIAGNOSIS — Z79899 Other long term (current) drug therapy: Secondary | ICD-10-CM | POA: Diagnosis not present

## 2014-11-13 DIAGNOSIS — I509 Heart failure, unspecified: Secondary | ICD-10-CM | POA: Diagnosis not present

## 2014-11-13 DIAGNOSIS — I129 Hypertensive chronic kidney disease with stage 1 through stage 4 chronic kidney disease, or unspecified chronic kidney disease: Secondary | ICD-10-CM | POA: Diagnosis not present

## 2014-11-13 DIAGNOSIS — I482 Chronic atrial fibrillation, unspecified: Secondary | ICD-10-CM

## 2014-11-13 LAB — URINALYSIS, ROUTINE W REFLEX MICROSCOPIC
Bilirubin Urine: NEGATIVE
Glucose, UA: NEGATIVE mg/dL
Hgb urine dipstick: NEGATIVE
Ketones, ur: NEGATIVE mg/dL
LEUKOCYTES UA: NEGATIVE
NITRITE: NEGATIVE
PH: 7 (ref 5.0–8.0)
Protein, ur: NEGATIVE mg/dL
SPECIFIC GRAVITY, URINE: 1.011 (ref 1.005–1.030)
Urobilinogen, UA: 0.2 mg/dL (ref 0.0–1.0)

## 2014-11-13 LAB — CBC
HEMATOCRIT: 39.4 % (ref 36.0–46.0)
HEMOGLOBIN: 13.4 g/dL (ref 12.0–15.0)
MCH: 31.9 pg (ref 26.0–34.0)
MCHC: 34 g/dL (ref 30.0–36.0)
MCV: 93.8 fL (ref 78.0–100.0)
Platelets: 167 10*3/uL (ref 150–400)
RBC: 4.2 MIL/uL (ref 3.87–5.11)
RDW: 17.4 % — ABNORMAL HIGH (ref 11.5–15.5)
WBC: 4.2 10*3/uL (ref 4.0–10.5)

## 2014-11-13 LAB — BASIC METABOLIC PANEL
Anion gap: 11 (ref 5–15)
BUN: 24 mg/dL — AB (ref 6–20)
CHLORIDE: 107 mmol/L (ref 101–111)
CO2: 21 mmol/L — ABNORMAL LOW (ref 22–32)
CREATININE: 1 mg/dL (ref 0.44–1.00)
Calcium: 9.9 mg/dL (ref 8.9–10.3)
GFR, EST NON AFRICAN AMERICAN: 53 mL/min — AB (ref >60)
Glucose, Bld: 140 mg/dL — ABNORMAL HIGH (ref 65–99)
POTASSIUM: 3.5 mmol/L (ref 3.5–5.1)
SODIUM: 139 mmol/L (ref 135–145)

## 2014-11-13 LAB — BRAIN NATRIURETIC PEPTIDE: B Natriuretic Peptide: 820.6 pg/mL — ABNORMAL HIGH (ref 0.0–100.0)

## 2014-11-13 LAB — PROTIME-INR
INR: 1.95 — ABNORMAL HIGH (ref 0.00–1.49)
PROTHROMBIN TIME: 22.2 s — AB (ref 11.6–15.2)

## 2014-11-13 MED ORDER — FUROSEMIDE 10 MG/ML IJ SOLN
20.0000 mg | INTRAMUSCULAR | Status: AC
  Administered 2014-11-13: 20 mg via INTRAVENOUS
  Filled 2014-11-13: qty 2

## 2014-11-13 NOTE — ED Notes (Signed)
Pt states that she has not felt well/generalized weakness since last night. Pt states that she has a fib and thinks that she is back in that rhythm. Pt denies chest pain.

## 2014-11-13 NOTE — Discharge Instructions (Signed)

## 2014-11-13 NOTE — ED Notes (Signed)
MD at bedside. 

## 2014-11-13 NOTE — ED Notes (Signed)
Patient transported to X-ray 

## 2014-11-13 NOTE — ED Provider Notes (Signed)
CSN: [id number]     Arrival date & time 11/13/14  0946 History   First MD Initiated Contact with Patient 11/13/14 [phone]     Chief Complaint  Patient presents with  . Atrial Fibrillation     (Consider location/radiation/quality/duration/timing/severity/associated sxs/prior Treatment) HPI Comments: 79 year old female with past medical history including atrial fibrillation, CK D, hypertension, fibromuscular dysplasia, CHF who presents with weakness. The patient reports generalized weakness for the past few weeks that got worse last night. She is concerned that it is due to her atrial fibrillation that she denies any associated chest pain or shortness of breath. She has been compliant with all of her medications. She denies any fevers, vomiting, headache, visual changes, abdominal pain, diarrhea, or recent illness. No urinary symptoms. She was evaluated on 11/2 in the emergency department for generalized fatigue and workup at that time was negative. She has an appointment tomorrow with cardiology.  The history is provided by the patient.    Past Medical History  Diagnosis Date  . Essential hypertension   . Atherosclerosis of renal artery (HCC)     a. s/p R RA stenting;  b. 04/2014 Renal Art duplex: Patent R RA stent, <60% L RA stenosis. Followed by [facility].  . Fibrocystic breast   . Fibromuscular dysplasia ([place])   . Carotid arterial disease ([place])     a. 01/2014 Carotid U/S: RICA 09%, LICA 32%. Followed by [facility].  . Paroxysmal atrial fibrillation ([place])     a. Dx 07/26/2014, CHA2DS2VASc = 5-->Eliquis;  c. 08/2014 Echo: EF 55-60%, no rwma, mildly dil LA/RA, mod-sev TR, PASP 54mmHg. b. s/p DCCV 08/2014. c. back in atrial flutter 09/2014 -> rate control pursued.  . Paroxysmal atrial flutter ([place])     a. Dx 07/26/2014, CHA2DS2VASc = 5-->Eliquis;  c. 08/2014 Echo: EF 55-60%, no rwma, mildly dil LA/RA, mod-sev TR, PASP 18mmHg. b. s/p DCCV 08/2014. c. back in atrial flutter 09/2014 -> rate control pursued.  . CKD  (chronic kidney disease), stage III     Stage II/III  . Tricuspid regurgitation     a. Echo 08/2014: mod-severe.  . Mitral regurgitation     a. Echo 08/2014: mild MR.  Marland Kitchen Hyperglycemia    Past Surgical History  Procedure Laterality Date  . Renal artery stent  2008    Right renal artery by Dr. Drucie Opitz  . Hemorroidectomy    . Tubal ligation  1970's  . Tonsillectomy    . Cardioversion N/A 09/01/2014    Procedure: CARDIOVERSION;  Surgeon: Josue Hector, MD;  Location[facility] ENDOSCOPY;  Service: Cardiovascular;  Laterality: N/A;   Family History  Problem Relation Age of Onset  . Stroke Mother 39  . Diabetes Mother   . Hypertension Mother   . Aneurysm Father     abdominal aortic aneurysm  . Diabetes Father   . Hypertension Father   . Cancer Sister 15    breast cancer   Social History  Substance Use Topics  . Smoking status: Never Smoker   . Smokeless tobacco: Never Used  . Alcohol Use: No   OB History    Gravida Para Term Preterm AB TAB SAB Ectopic Multiple Living   4 4 4  0 0 0 0 0 0 4     Review of Systems    Allergies  Tagamet  Home Medications   Prior to Admission medications   Medication Sig Start Date End Date Taking? Authorizing Provider  acetaminophen-codeine (TYLENOL #3) 300-30 MG per tablet Take 1-2 tablets  by mouth every 6 (six) hours as needed for moderate pain. 09/05/14  Yes Domenic Moras, PA-C  apixaban (ELIQUIS) 5 MG TABS tablet Take 1 tablet (5 mg total) by mouth 2 (two) times daily. 08/09/14  Yes Rogelia Mire, NP  B Complex-C (SUPER B COMPLEX PO) Take 1 tablet by mouth daily.   Yes Historical Provider, MD  carvedilol (COREG) 12.5 MG tablet TAKE ONE & ONE-HALF TABLETS BY MOUTH TWICE DAILY 11/10/14  Yes Rogelia Mire, NP  diltiazem (CARDIZEM CD) 180 MG 24 hr capsule Take 1 capsule (180 mg total) by mouth daily. 09/28/14  Yes Dayna N Dunn, PA-C  ESTRACE VAGINAL 0.1 MG/GM vaginal cream Place 1 Applicatorful vaginally every Monday, Wednesday, and  Friday.  08/23/14  Yes Historical Provider, MD  furosemide (LASIX) 20 MG tablet Take 20 mg by mouth every Monday, Wednesday, and Friday.  08/18/14  Yes Historical Provider, MD  Multiple Vitamin (MULTIVITAMIN PO) Take 1 tablet by mouth daily.    Yes Historical Provider, MD  NITROSTAT 0.4 MG SL tablet Place 0.4 mg under the tongue every 5 (five) minutes as needed for chest pain.    Yes Historical Provider, MD  potassium chloride SA (KLOR-CON M20) 20 MEQ tablet Take 1 tablet (20 mEq total) by mouth daily. 09/28/14  Yes Dayna N Dunn, PA-C   BP 137/85 mmHg  Pulse 78  Temp(Src) 97.5 F (36.4 C) (Oral)  Resp 18  SpO2 98% Physical Exam  Constitutional: She is oriented to person, place, and time. She appears well-developed and well-nourished. No distress.  Pleasant  HENT:  Head: Normocephalic and atraumatic.  Mouth/Throat: Oropharynx is clear and moist.  Moist mucous membranes  Eyes: Conjunctivae are normal. Pupils are equal, round, and reactive to light.  Neck: Neck supple.  Cardiovascular: Normal rate and normal heart sounds.   Irregularly irregular rhythm, 3/6 systolic murmur  Pulmonary/Chest: Effort normal and breath sounds normal.  Abdominal: Soft. Bowel sounds are normal. She exhibits no distension. There is no tenderness.  Musculoskeletal:  5/5 strength throughout; 1+ pitting bilateral lower extremity edema  Neurological: She is alert and oriented to person, place, and time.  Fluent speech  Skin: Skin is warm and dry.  Psychiatric: She has a normal mood and affect. Judgment normal.  Nursing note and vitals reviewed.   ED Course  Procedures (including critical care time) Labs Review Labs Reviewed  BASIC METABOLIC PANEL - Abnormal; Notable for the following:    CO2 21 (*)    Glucose, Bld 140 (*)    BUN 24 (*)    GFR calc non Af Amer 53 (*)    All other components within normal limits  CBC - Abnormal; Notable for the following:    RDW 17.4 (*)    All other components within  normal limits  PROTIME-INR - Abnormal; Notable for the following:    Prothrombin Time 22.2 (*)    INR 1.95 (*)    All other components within normal limits  BRAIN NATRIURETIC PEPTIDE - Abnormal; Notable for the following:    B Natriuretic Peptide 820.6 (*)    All other components within normal limits  URINALYSIS, ROUTINE W REFLEX MICROSCOPIC (NOT AT [facility])    Imaging Review Dg Chest 2 View  11/13/2014  CLINICAL DATA:  Chest discomfort for a few days. Atrial fibrillation. EXAM: CHEST  2 VIEW COMPARISON:  10/12/2014 FINDINGS: There is mild cardiomegaly and vascular congestion. Airspace opacity in both lower lobes could reflect atelectasis or infiltrates. Small bilateral effusions. No  acute bony abnormality. IMPRESSION: Small bilateral pleural effusions with bibasilar atelectasis or infiltrates. Electronically Signed   By: Rolm Baptise M.D.   On: 11/13/2014 11:01   I have personally reviewed and evaluated these lab results as part of my medical decision-making.   EKG Interpretation   Date/Time:  Monday November 13 2014 09:54:49 EST Ventricular Rate:  88 PR Interval:    QRS Duration: 84 QT Interval:  360 QTC Calculation: 435 R Axis:   120 Text Interpretation:  Atrial fibrillation Low voltage, extremity and  precordial leads Probable right ventricular hypertrophy Abnormal lateral Q  waves similar to previous EKG Confirmed by LITTLE MD, RACHEL [phone]) on  11/13/2014 10:41:21 AM     Medications  furosemide (LASIX) injection 20 mg (20 mg Intravenous Given 11/13/14 1314)    MDM   Final diagnoses:  Weakness  Congestive heart failure, unspecified congestive heart failure chronicity, unspecified congestive heart failure type (HCC)  Chronic atrial fibrillation ([place])    This is a very pleasant 79 year old female who presents with several weeks of generalized weakness that became worse last night. Patient well-appearing at presentation. Vital signs stable. EKG showed atrial fibrillation  without ischemic changes. She had mild lower extremity edema on exam but was breathing comfortably. No neuro deficits. Obtained above lab work which showed INR 1.95, BNP 820 which is slightly less than previous. EKG showed small bilateral pleural effusions. Gave the patient a dose of 20 mg IV Lasix given her evidence of volume overload on chest x-ray. She is otherwise well-appearing with reassuring labs and vitals. She has an appointment with cardiology tomorrow and I have instructed her to follow-up regarding her symptoms. I have also instructed her to contact her PCP for an appointment for further workup of generalized weakness. I spent a while educating her husband and the patient regarding the diagnosis of atrial fibrillation and reassured them that she is well controlled on her current medications. Return precautions reviewed. Patient discharged in satisfactory condition.  Sharlett Iles, MD 11/13/14 [phone]

## 2014-11-14 ENCOUNTER — Ambulatory Visit (INDEPENDENT_AMBULATORY_CARE_PROVIDER_SITE_OTHER): Payer: Commercial Managed Care - HMO | Admitting: Physician Assistant

## 2014-11-14 ENCOUNTER — Encounter: Payer: Self-pay | Admitting: Physician Assistant

## 2014-11-14 VITALS — BP 130/70 | HR 92 | Ht 61.0 in | Wt 108.0 lb

## 2014-11-14 DIAGNOSIS — I10 Essential (primary) hypertension: Secondary | ICD-10-CM | POA: Diagnosis not present

## 2014-11-14 DIAGNOSIS — N182 Chronic kidney disease, stage 2 (mild): Secondary | ICD-10-CM

## 2014-11-14 DIAGNOSIS — R5383 Other fatigue: Secondary | ICD-10-CM

## 2014-11-14 DIAGNOSIS — I4892 Unspecified atrial flutter: Secondary | ICD-10-CM

## 2014-11-14 DIAGNOSIS — I480 Paroxysmal atrial fibrillation: Secondary | ICD-10-CM

## 2014-11-14 DIAGNOSIS — I5032 Chronic diastolic (congestive) heart failure: Secondary | ICD-10-CM

## 2014-11-14 MED ORDER — CARVEDILOL 12.5 MG PO TABS: 18.7500 mg | ORAL_TABLET | Freq: Two times a day (BID) | ORAL | Status: DC

## 2014-11-14 MED ORDER — DILTIAZEM HCL ER COATED BEADS 180 MG PO CP24: 180.0000 mg | ORAL_CAPSULE | Freq: Every day | ORAL | Status: DC

## 2014-11-14 MED ORDER — NITROGLYCERIN 0.4 MG SL SUBL: 0.4000 mg | SUBLINGUAL_TABLET | SUBLINGUAL | Status: AC | PRN

## 2014-11-14 NOTE — Patient Instructions (Addendum)
Medication Instructions:  Your physician recommends that you continue on your current medications as directed. Please refer to the Current Medication list given to you today.   Labwork: None ordered    Testing/Procedures: None ordered  Follow-Up: Your physician recommends that you schedule a follow-up appointment in: [place]  Any Other Special Instructions Will Be Listed Below (If Applicable).   If you need a refill on your cardiac medications before your next appointment, please call your pharmacy.

## 2014-11-15 ENCOUNTER — Ambulatory Visit (HOSPITAL_COMMUNITY)
Admission: RE | Admit: 2014-11-15 | Discharge: 2014-11-15 | Disposition: A | Discharge status: Home / Self Care | Payer: Commercial Managed Care - HMO | Source: Ambulatory Visit | Attending: Nurse Practitioner | Admitting: Nurse Practitioner

## 2014-11-15 VITALS — BP 118/66 | HR 93 | Ht 61.0 in | Wt 110.0 lb

## 2014-11-15 DIAGNOSIS — I4819 Other persistent atrial fibrillation: Secondary | ICD-10-CM

## 2014-11-15 DIAGNOSIS — I481 Persistent atrial fibrillation: Secondary | ICD-10-CM | POA: Diagnosis not present

## 2014-11-16 ENCOUNTER — Encounter (HOSPITAL_COMMUNITY): Payer: Self-pay | Admitting: Nurse Practitioner

## 2014-11-16 ENCOUNTER — Ambulatory Visit (INDEPENDENT_AMBULATORY_CARE_PROVIDER_SITE_OTHER): Payer: Commercial Managed Care - HMO

## 2014-11-16 DIAGNOSIS — I481 Persistent atrial fibrillation: Secondary | ICD-10-CM

## 2014-11-16 DIAGNOSIS — I4819 Other persistent atrial fibrillation: Secondary | ICD-10-CM

## 2014-11-16 NOTE — Progress Notes (Signed)
Patient ID: Donna [name], female   DOB: 06-04-1935, 79 y.o.   MRN: [id number]     Primary Care Physician: Henrine Screws, MD Referring Physician: Sharrell Ku, PA   Donna [name] is a 79 y.o. female with a h/o  hypertension, renal artery stenosis as below (followed by [facility]), moderate carotid arterial disease 01/2014 (followed by [facility]), paroxysmal atrial fibrillation/flutter, probable CKD stage II-III, valvular heart disease (mod-severe TR and mild MR by echo 08/2014) who presents for ER follow-up.   Per Sharrell Ku, PA, note from 11/8.... With regard to recent history, her AF was diagnosed in July 2016. She was supposed to be discharged with Xarelto and BB therapy but the patient didn't recall taking this. She was seen back in August and was started on Eliquis. The case was discussed with Dr. Caryl Comes who felt that since she had atrial fib and atypical atrial flutter, she would not be an ablation candidate. The plan was to bring her back in for DCCV in 3-4 weeks. 2D Echo 08/09/14: EF 55-60%, mild biatrial enlargement, mild MR, mod-severe TR, mild-mod elevation in pulm pressure. She was then seen in the ER 08/16/14 complaining of chest pain. Troponin was negative and chest pain was felt atypical by EDP thus she was discharged with plan for outpatient follow-up. Lexiscan nuclear stress test was done 08/2014 which was negative for ischemia. Diltiazem was added for elevated rate and she underwent DCCV 09/01/14. When seen back in the office on 09/27/14, she was back in atrial flutter with HR low 100s but was completely asymptomatic. Strategies of rhythm vs rate control were discussed with the patient and in the absence of symptoms she elected to continue rate control. Diltiazem was further increased (but see below - patient never took the higher dose). She had also reported recent progressive weight loss (in setting of normal TSH) and was advised to discuss with PCP. She was seen in the ER 11/06/14 with general fatigue  but no focal abnormalities. She was in atrial fib at that time, with controlled HR 60-90BPM. Labs notable for neg UA, Cr 0.95, K 3.7, glucose 110, Hgb 12.9, troponin negative, BNP 927. She was asked to f/u here but again presented back again to the ER 11/13/14 with generalized weakness. She again was in rate controlled atrial fib with reassuring vitals. BNP 820, Hgb again normal, glucose 140, BUN/Cr 24/1.00. CXR showed small bilateral effusions with bibasilar atelectasis or infiltrates. She was treated with IV Lasix 20mg  x 1 and discharged home.  She comes back in with her family today. Although her husband is managing her medications I am not certain they have a good grasp on what she should actually be taking. (I.E. The last time she was in, there was confusion about her being on both amlodipine and diltiazem that we had to get sorted out.) This visit they inform me she apparently has not been taking the higher dose of diltiazem at all. Her husband thought this was the same as carvedilol so she hasn't been taking this medicine recently. Their daughter says they tend to keep the old pill bottles which likely confounds the confusion. The patient again describes a sensation of overall fatigue and possible decreased exercise tolerance. No chest pain or exertional dyspnea noted.  She is being evaluated in the afib clinic the next day after seeing Hinton Dyer. She were advised to f/u in one week but husband wanted sooner. She wanted to see effect of cardizem that Hinton Dyer thought had never been added. However,  when the husband went home and studied her drugs, he found that she had been taking all along. I don't know if the wife has some dementia but the husband keeps up with all the details of her medical management and he is very confident that she is getting her blood thinner on a regular basis.The pt will answer simple questions directly but if the answer involves any details she will look to her husband to answer. EKG shows  afib at 93 bpm, with one long r/r interval and when i was  auscultating heart sounds, I heard at least a 1-2 second pause.   Today, she denies symptoms of palpitations, chest pain, shortness of breath, orthopnea, PND, lower extremity edema, dizziness, presyncope, syncope, or neurologic sequela.Complains of some mild fatigue. The patient is tolerating medications without difficulties and is otherwise without complaint today.   Past Medical History  Diagnosis Date  . Essential hypertension   . Atherosclerosis of renal artery (HCC)     a. s/p R RA stenting;  b. 04/2014 Renal Art duplex: Patent R RA stent, <60% L RA stenosis. Followed by [facility].  . Fibrocystic breast   . Fibromuscular dysplasia ([place])   . Carotid arterial disease ([place])     a. 01/2014 Carotid U/S: RICA [id number], LICA [id number]. Followed by [facility].  . Paroxysmal atrial fibrillation ([place])     a. Dx 07/26/2014, CHA2DS2VASc = 5-->Eliquis;  c. 08/2014 Echo: EF 55-60%, no rwma, mildly dil LA/RA, mod-sev TR, PASP 47mmHg. b. s/p DCCV 08/2014. c. back in atrial flutter 09/2014 -> rate control pursued.  . Paroxysmal atrial flutter ([place])     a. Dx 07/26/2014, CHA2DS2VASc = 5-->Eliquis;  c. 08/2014 Echo: EF 55-60%, no rwma, mildly dil LA/RA, mod-sev TR, PASP 83mmHg. b. s/p DCCV 08/2014. c. back in atrial flutter 09/2014 -> rate control pursued.  . CKD (chronic kidney disease), stage III     Stage II/III  . Tricuspid regurgitation     a. Echo 08/2014: mod-severe.  . Mitral regurgitation     a. Echo 08/2014: mild MR.  Marland Kitchen Hyperglycemia    Past Surgical History  Procedure Laterality Date  . Renal artery stent  2008    Right renal artery by Dr. Drucie Opitz  . Hemorroidectomy    . Tubal ligation  1970's  . Tonsillectomy    . Cardioversion N/A 09/01/2014    Procedure: CARDIOVERSION;  Surgeon: Josue Hector, MD;  Location: [facility] ENDOSCOPY;  Service: Cardiovascular;  Laterality: N/A;    Current Outpatient Prescriptions  Medication Sig Dispense Refill  .  acetaminophen-codeine (TYLENOL #3) 300-30 MG per tablet Take 1-2 tablets by mouth every 6 (six) hours as needed for moderate pain. 15 tablet 0  . apixaban (ELIQUIS) 5 MG TABS tablet Take 1 tablet (5 mg total) by mouth 2 (two) times daily. 60 tablet 5  . B Complex-C (SUPER B COMPLEX PO) Take 1 tablet by mouth daily.    . carvedilol (COREG) 12.5 MG tablet Take 1.5 tablets (18.75 mg total) by mouth 2 (two) times daily. 90 tablet 10  . diltiazem (CARDIZEM CD) 180 MG 24 hr capsule Take 1 capsule (180 mg total) by mouth daily. 90 capsule 3  . ESTRACE VAGINAL 0.1 MG/GM vaginal cream Place 1 Applicatorful vaginally every Monday, Wednesday, and Friday.     . furosemide (LASIX) 20 MG tablet Take 20 mg by mouth every Monday, Wednesday, and Friday.     . Multiple Vitamin (MULTIVITAMIN PO) Take 1 tablet by mouth daily.     Marland Kitchen  potassium chloride SA (KLOR-CON M20) 20 MEQ tablet Take 1 tablet (20 mEq total) by mouth daily. 90 tablet 3  . nitroGLYCERIN (NITROSTAT) 0.4 MG SL tablet Place 1 tablet (0.4 mg total) under the tongue every 5 (five) minutes as needed for chest pain. (Patient not taking: Reported on 11/15/2014) 25 tablet 3   No current facility-administered medications for this encounter.    Allergies  Allergen Reactions  . Tagamet [Cimetidine] Hives    Social History   Social History  . Marital Status: Married    Spouse Name: N/A  . Number of Children: N/A  . Years of Education: N/A   Occupational History  . Not on file.   Social History Main Topics  . Smoking status: Never Smoker   . Smokeless tobacco: Never Used  . Alcohol Use: No  . Drug Use: No  . Sexual Activity: Yes    Birth Control/ Protection: Surgical     Comment: BTL   Other Topics Concern  . Not on file   Social History Narrative    Family History  Problem Relation Age of Onset  . Stroke Mother 58  . Diabetes Mother   . Hypertension Mother   . Aneurysm Father     abdominal aortic aneurysm  . Diabetes Father   .  Hypertension Father   . Cancer Sister 46    breast cancer  . Heart attack Father     ROS- All systems are reviewed and negative except as per the HPI above  Physical Exam: Filed Vitals:   11/15/14 1425  BP: 118/66  Pulse: 93  Height: 5\' 1"  (1.549 m)  Weight: 110 lb (49.896 kg)    GEN- The patient is well appearing, alert and oriented x 3 today.   Head- normocephalic, atraumatic Eyes-  Sclera clear, conjunctiva pink Ears- hearing intact Oropharynx- clear Neck- supple, no JVP Lymph- no cervical lymphadenopathy Lungs- Clear to ausculation bilaterally, normal work of breathing Heart- Irregular rate and rhythm, no murmurs, rubs or gallops, PMI not laterally displaced GI- soft, NT, ND, + BS Extremities- no clubbing, cyanosis, or edema MS- no significant deformity or atrophy Skin- no rash or lesion Psych- euthymic mood, full affect Neuro- strength and sensation are intact  EKG- Afib at 93 bpm, with one long r/r interval, qrs int 72 ms, qtc 432 ms. Epic records reviewed  Assessment and Plan: 1. Persistent afib Previous DCCV with ERAF Minimally symptomatic Husband assures me that his wife is on correct meds as per outlined yesterday with Hinton Dyer. For now will not change rate control due to long r/r on ekg and auscultated pause 24 hour monitor to be placed  2. Chadsvasc of at least 53 Husband is adamant that she is taking apixaban bid without missed doses. No bleeding issues Steady of feet  F/u in one week for monitor review and further treatment decisions  Butch Penny C. Alanny Rivers, [facility] [address] [place], [place] [address] [phone]

## 2014-11-18 ENCOUNTER — Inpatient Hospital Stay (HOSPITAL_COMMUNITY)
Admission: EM | Admit: 2014-11-18 | Discharge: 2014-11-24 | DRG: 291 | Disposition: A | Discharge status: Home / Self Care | Payer: Commercial Managed Care - HMO | Attending: Internal Medicine | Admitting: Internal Medicine

## 2014-11-18 ENCOUNTER — Encounter (HOSPITAL_COMMUNITY): Payer: Self-pay | Admitting: *Deleted

## 2014-11-18 ENCOUNTER — Emergency Department (HOSPITAL_COMMUNITY): Payer: Commercial Managed Care - HMO

## 2014-11-18 DIAGNOSIS — Z95828 Presence of other vascular implants and grafts: Secondary | ICD-10-CM

## 2014-11-18 DIAGNOSIS — Z681 Body mass index (BMI) 19 or less, adult: Secondary | ICD-10-CM

## 2014-11-18 DIAGNOSIS — I10 Essential (primary) hypertension: Secondary | ICD-10-CM | POA: Diagnosis present

## 2014-11-18 DIAGNOSIS — I081 Rheumatic disorders of both mitral and tricuspid valves: Secondary | ICD-10-CM | POA: Diagnosis present

## 2014-11-18 DIAGNOSIS — I5033 Acute on chronic diastolic (congestive) heart failure: Secondary | ICD-10-CM | POA: Diagnosis not present

## 2014-11-18 DIAGNOSIS — N183 Chronic kidney disease, stage 3 unspecified: Secondary | ICD-10-CM

## 2014-11-18 DIAGNOSIS — Z7901 Long term (current) use of anticoagulants: Secondary | ICD-10-CM

## 2014-11-18 DIAGNOSIS — R410 Disorientation, unspecified: Secondary | ICD-10-CM | POA: Diagnosis not present

## 2014-11-18 DIAGNOSIS — E876 Hypokalemia: Secondary | ICD-10-CM | POA: Diagnosis not present

## 2014-11-18 DIAGNOSIS — R634 Abnormal weight loss: Secondary | ICD-10-CM | POA: Diagnosis present

## 2014-11-18 DIAGNOSIS — I455 Other specified heart block: Secondary | ICD-10-CM | POA: Diagnosis present

## 2014-11-18 DIAGNOSIS — I5032 Chronic diastolic (congestive) heart failure: Secondary | ICD-10-CM | POA: Diagnosis present

## 2014-11-18 DIAGNOSIS — I130 Hypertensive heart and chronic kidney disease with heart failure and stage 1 through stage 4 chronic kidney disease, or unspecified chronic kidney disease: Principal | ICD-10-CM | POA: Diagnosis present

## 2014-11-18 DIAGNOSIS — I484 Atypical atrial flutter: Secondary | ICD-10-CM | POA: Diagnosis present

## 2014-11-18 DIAGNOSIS — R739 Hyperglycemia, unspecified: Secondary | ICD-10-CM | POA: Diagnosis present

## 2014-11-18 DIAGNOSIS — R0902 Hypoxemia: Secondary | ICD-10-CM | POA: Diagnosis present

## 2014-11-18 DIAGNOSIS — I480 Paroxysmal atrial fibrillation: Secondary | ICD-10-CM | POA: Diagnosis present

## 2014-11-18 DIAGNOSIS — I482 Chronic atrial fibrillation, unspecified: Secondary | ICD-10-CM

## 2014-11-18 LAB — CBC
HCT: 40.2 % (ref 36.0–46.0)
Hemoglobin: 13.6 g/dL (ref 12.0–15.0)
MCH: 32.5 pg (ref 26.0–34.0)
MCHC: 33.8 g/dL (ref 30.0–36.0)
MCV: 95.9 fL (ref 78.0–100.0)
PLATELETS: 160 10*3/uL (ref 150–400)
RBC: 4.19 MIL/uL (ref 3.87–5.11)
RDW: 17.5 % — ABNORMAL HIGH (ref 11.5–15.5)
WBC: 4.2 10*3/uL (ref 4.0–10.5)

## 2014-11-18 LAB — TROPONIN I

## 2014-11-18 LAB — BASIC METABOLIC PANEL
Anion gap: 6 (ref 5–15)
BUN: 24 mg/dL — ABNORMAL HIGH (ref 6–20)
CALCIUM: 9.7 mg/dL (ref 8.9–10.3)
CO2: 22 mmol/L (ref 22–32)
CREATININE: 1.07 mg/dL — AB (ref 0.44–1.00)
Chloride: 110 mmol/L (ref 101–111)
GFR, EST AFRICAN AMERICAN: 57 mL/min — AB (ref >60)
GFR, EST NON AFRICAN AMERICAN: 49 mL/min — AB (ref >60)
Glucose, Bld: 146 mg/dL — ABNORMAL HIGH (ref 65–99)
Potassium: 4.7 mmol/L (ref 3.5–5.1)
Sodium: 138 mmol/L (ref 135–145)

## 2014-11-18 NOTE — ED Notes (Signed)
The pt is c/o a strange feeling in her chest for 2-3 days.  No sob

## 2014-11-19 ENCOUNTER — Encounter (HOSPITAL_COMMUNITY): Payer: Self-pay | Admitting: General Practice

## 2014-11-19 DIAGNOSIS — I5033 Acute on chronic diastolic (congestive) heart failure: Secondary | ICD-10-CM | POA: Diagnosis not present

## 2014-11-19 DIAGNOSIS — I081 Rheumatic disorders of both mitral and tricuspid valves: Secondary | ICD-10-CM | POA: Diagnosis not present

## 2014-11-19 DIAGNOSIS — N183 Chronic kidney disease, stage 3 unspecified: Secondary | ICD-10-CM | POA: Diagnosis present

## 2014-11-19 DIAGNOSIS — R739 Hyperglycemia, unspecified: Secondary | ICD-10-CM | POA: Diagnosis not present

## 2014-11-19 DIAGNOSIS — I10 Essential (primary) hypertension: Secondary | ICD-10-CM | POA: Diagnosis not present

## 2014-11-19 DIAGNOSIS — I455 Other specified heart block: Secondary | ICD-10-CM | POA: Diagnosis not present

## 2014-11-19 DIAGNOSIS — I480 Paroxysmal atrial fibrillation: Secondary | ICD-10-CM | POA: Diagnosis present

## 2014-11-19 DIAGNOSIS — I482 Chronic atrial fibrillation, unspecified: Secondary | ICD-10-CM | POA: Diagnosis present

## 2014-11-19 DIAGNOSIS — R634 Abnormal weight loss: Secondary | ICD-10-CM | POA: Diagnosis present

## 2014-11-19 DIAGNOSIS — I484 Atypical atrial flutter: Secondary | ICD-10-CM | POA: Diagnosis present

## 2014-11-19 DIAGNOSIS — Z7901 Long term (current) use of anticoagulants: Secondary | ICD-10-CM | POA: Diagnosis not present

## 2014-11-19 DIAGNOSIS — I509 Heart failure, unspecified: Secondary | ICD-10-CM | POA: Insufficient documentation

## 2014-11-19 DIAGNOSIS — R0902 Hypoxemia: Secondary | ICD-10-CM | POA: Diagnosis not present

## 2014-11-19 DIAGNOSIS — I5031 Acute diastolic (congestive) heart failure: Secondary | ICD-10-CM | POA: Insufficient documentation

## 2014-11-19 DIAGNOSIS — R410 Disorientation, unspecified: Secondary | ICD-10-CM | POA: Diagnosis not present

## 2014-11-19 DIAGNOSIS — I4892 Unspecified atrial flutter: Secondary | ICD-10-CM | POA: Diagnosis not present

## 2014-11-19 DIAGNOSIS — Z681 Body mass index (BMI) 19 or less, adult: Secondary | ICD-10-CM | POA: Diagnosis not present

## 2014-11-19 DIAGNOSIS — Z95828 Presence of other vascular implants and grafts: Secondary | ICD-10-CM | POA: Diagnosis not present

## 2014-11-19 DIAGNOSIS — I5032 Chronic diastolic (congestive) heart failure: Secondary | ICD-10-CM | POA: Diagnosis present

## 2014-11-19 DIAGNOSIS — E876 Hypokalemia: Secondary | ICD-10-CM | POA: Diagnosis not present

## 2014-11-19 DIAGNOSIS — I130 Hypertensive heart and chronic kidney disease with heart failure and stage 1 through stage 4 chronic kidney disease, or unspecified chronic kidney disease: Secondary | ICD-10-CM | POA: Diagnosis present

## 2014-11-19 DIAGNOSIS — I481 Persistent atrial fibrillation: Secondary | ICD-10-CM | POA: Diagnosis not present

## 2014-11-19 LAB — CBC
HEMATOCRIT: 42.5 % (ref 36.0–46.0)
HEMOGLOBIN: 14.8 g/dL (ref 12.0–15.0)
MCH: 32.9 pg (ref 26.0–34.0)
MCHC: 34.8 g/dL (ref 30.0–36.0)
MCV: 94.4 fL (ref 78.0–100.0)
Platelets: 146 10*3/uL — ABNORMAL LOW (ref 150–400)
RBC: 4.5 MIL/uL (ref 3.87–5.11)
RDW: 17.7 % — ABNORMAL HIGH (ref 11.5–15.5)
WBC: 4.6 10*3/uL (ref 4.0–10.5)

## 2014-11-19 LAB — BRAIN NATRIURETIC PEPTIDE: B Natriuretic Peptide: 1196.4 pg/mL — ABNORMAL HIGH (ref 0.0–100.0)

## 2014-11-19 LAB — TSH: TSH: 0.443 u[IU]/mL (ref 0.350–4.500)

## 2014-11-19 MED ORDER — SODIUM CHLORIDE 0.9 % IJ SOLN
3.0000 mL | Freq: Two times a day (BID) | INTRAMUSCULAR | Status: DC
  Administered 2014-11-20: 3 mL via INTRAVENOUS

## 2014-11-19 MED ORDER — ONDANSETRON HCL 4 MG/2ML IJ SOLN: 4.0000 mg | Freq: Four times a day (QID) | INTRAMUSCULAR | Status: DC | PRN

## 2014-11-19 MED ORDER — SODIUM CHLORIDE 0.9 % IJ SOLN
3.0000 mL | Freq: Two times a day (BID) | INTRAMUSCULAR | Status: DC
  Administered 2014-11-19 – 2014-11-21 (×4): 3 mL via INTRAVENOUS

## 2014-11-19 MED ORDER — NITROGLYCERIN 0.4 MG SL SUBL: 0.4000 mg | SUBLINGUAL_TABLET | SUBLINGUAL | Status: DC | PRN

## 2014-11-19 MED ORDER — SODIUM CHLORIDE 0.9 % IV SOLN: 250.0000 mL | INTRAVENOUS | Status: DC | PRN

## 2014-11-19 MED ORDER — ACETAMINOPHEN 650 MG RE SUPP: 650.0000 mg | Freq: Four times a day (QID) | RECTAL | Status: DC | PRN

## 2014-11-19 MED ORDER — SODIUM CHLORIDE 0.9 % IJ SOLN: 3.0000 mL | INTRAMUSCULAR | Status: DC | PRN

## 2014-11-19 MED ORDER — FUROSEMIDE 10 MG/ML IJ SOLN
40.0000 mg | Freq: Two times a day (BID) | INTRAMUSCULAR | Status: DC
  Administered 2014-11-19 – 2014-11-20 (×3): 40 mg via INTRAVENOUS
  Filled 2014-11-19 (×3): qty 4

## 2014-11-19 MED ORDER — POTASSIUM CHLORIDE CRYS ER 20 MEQ PO TBCR
20.0000 meq | EXTENDED_RELEASE_TABLET | Freq: Every day | ORAL | Status: DC
  Administered 2014-11-20: 20 meq via ORAL
  Filled 2014-11-19 (×2): qty 1

## 2014-11-19 MED ORDER — ACETAMINOPHEN 325 MG PO TABS: 650.0000 mg | ORAL_TABLET | Freq: Four times a day (QID) | ORAL | Status: DC | PRN

## 2014-11-19 MED ORDER — FUROSEMIDE 10 MG/ML IJ SOLN
40.0000 mg | Freq: Once | INTRAMUSCULAR | Status: AC
  Administered 2014-11-19: 40 mg via INTRAVENOUS
  Filled 2014-11-19: qty 4

## 2014-11-19 MED ORDER — OXYCODONE HCL 5 MG PO TABS: 5.0000 mg | ORAL_TABLET | ORAL | Status: DC | PRN

## 2014-11-19 MED ORDER — FUROSEMIDE 20 MG PO TABS: 20.0000 mg | ORAL_TABLET | Freq: Every day | ORAL | Status: DC

## 2014-11-19 MED ORDER — HYDROMORPHONE HCL 1 MG/ML IJ SOLN: 0.5000 mg | INTRAMUSCULAR | Status: DC | PRN

## 2014-11-19 MED ORDER — POTASSIUM CHLORIDE CRYS ER 10 MEQ PO TBCR
10.0000 meq | EXTENDED_RELEASE_TABLET | Freq: Every day | ORAL | Status: DC
  Filled 2014-11-19: qty 1

## 2014-11-19 MED ORDER — ONDANSETRON HCL 4 MG PO TABS: 4.0000 mg | ORAL_TABLET | Freq: Four times a day (QID) | ORAL | Status: DC | PRN

## 2014-11-19 MED ORDER — DILTIAZEM HCL ER COATED BEADS 180 MG PO CP24
180.0000 mg | ORAL_CAPSULE | Freq: Every day | ORAL | Status: DC
  Administered 2014-11-19 – 2014-11-21 (×3): 180 mg via ORAL
  Filled 2014-11-19 (×3): qty 1

## 2014-11-19 MED ORDER — APIXABAN 5 MG PO TABS
5.0000 mg | ORAL_TABLET | Freq: Two times a day (BID) | ORAL | Status: DC
  Administered 2014-11-19 – 2014-11-24 (×11): 5 mg via ORAL
  Filled 2014-11-19 (×11): qty 1

## 2014-11-19 MED ORDER — ALUM & MAG HYDROXIDE-SIMETH 200-200-20 MG/5ML PO SUSP: 30.0000 mL | Freq: Four times a day (QID) | ORAL | Status: DC | PRN

## 2014-11-19 MED ORDER — CARVEDILOL 6.25 MG PO TABS
18.7500 mg | ORAL_TABLET | Freq: Two times a day (BID) | ORAL | Status: DC
  Administered 2014-11-19 – 2014-11-20 (×3): 18.75 mg via ORAL
  Filled 2014-11-19 (×6): qty 1

## 2014-11-19 NOTE — Progress Notes (Signed)
Lab called, the blood drawn was not enough for TSH, new order in to recollect the blood. Will continue to monitor patient.

## 2014-11-19 NOTE — ED Provider Notes (Signed)
CSN: [id number]     Arrival date & time 11/18/14  2216 History   By signing my name below, I, Donna [name], attest that this documentation has been prepared under the direction and in the presence of [name], MD. Electronically Signed: [name], ED Scribe. 11/19/2014. 3:17 AM.    Chief Complaint  Patient presents with  . Chest Pain   HPI HPI Comments: Donna [name] is a 79 y.o. female who presents to the Emergency Department complaining of generalized fatigue x 1 months and occasional SOB. Pts husband states that the past several days she has been having trouble sleeping and generalized weakness. Pt states that her symptoms began when she started taking carvedilol. Pt also reports leg swelling since changing the medications as well. Pt states that she has a HX of A-Fib. Pt states that she has a follow up appointment with the vascular center on Tuesday. Patient denies any chest pain to me but reported "feeling strange" to nursing. She states that she just generally has decreased energy. Denies any fevers, cough or any new or worsening symptoms.   Past Medical History  Diagnosis Date  . Essential hypertension   . Atherosclerosis of renal artery (HCC)     a. s/p R RA stenting;  b. 04/2014 Renal Art duplex: Patent R RA stent, <60% L RA stenosis. Followed by [facility].  . Fibrocystic breast   . Fibromuscular dysplasia ([place])   . Carotid arterial disease ([place])     a. 01/2014 Carotid U/S: RICA [id number], LICA [id number]. Followed by [facility].  . Paroxysmal atrial fibrillation ([place])     a. Dx 07/26/2014, CHA2DS2VASc = 5-->Eliquis;  c. 08/2014 Echo: EF 55-60%, no rwma, mildly dil LA/RA, mod-sev TR, PASP 61mmHg. b. s/p DCCV 08/2014. c. back in atrial flutter 09/2014 -> rate control pursued.  . Paroxysmal atrial flutter ([place])     a. Dx 07/26/2014, CHA2DS2VASc = 5-->Eliquis;  c. 08/2014 Echo: EF 55-60%, no rwma, mildly dil LA/RA, mod-sev TR, PASP 51mmHg. b. s/p DCCV 08/2014. c. back in atrial flutter 09/2014 -> rate  control pursued.  . CKD (chronic kidney disease), stage III     Stage II/III  . Tricuspid regurgitation     a. Echo 08/2014: mod-severe.  . Mitral regurgitation     a. Echo 08/2014: mild MR.  Marland Kitchen Hyperglycemia    Past Surgical History  Procedure Laterality Date  . Renal artery stent  2008    Right renal artery by Dr. Drucie Opitz  . Hemorroidectomy    . Tubal ligation  1970's  . Tonsillectomy    . Cardioversion N/A 09/01/2014    Procedure: CARDIOVERSION;  Surgeon: Josue Hector, MD;  Location: [facility] ENDOSCOPY;  Service: Cardiovascular;  Laterality: N/A;   Family History  Problem Relation Age of Onset  . Stroke Mother 41  . Diabetes Mother   . Hypertension Mother   . Aneurysm Father     abdominal aortic aneurysm  . Diabetes Father   . Hypertension Father   . Cancer Sister 40    breast cancer  . Heart attack Father    Social History  Substance Use Topics  . Smoking status: Never Smoker   . Smokeless tobacco: Never Used  . Alcohol Use: Yes   OB History    Gravida Para Term Preterm AB TAB SAB Ectopic Multiple Living   4 4 4  0 0 0 0 0 0 4     Review of Systems  Constitutional: Positive for fatigue. Negative for  fever.  Respiratory: Positive for shortness of breath. Negative for chest tightness.   Cardiovascular: Positive for leg swelling. Negative for chest pain.  Gastrointestinal: Negative for nausea, vomiting and abdominal pain.  Genitourinary: Negative for dysuria.  Neurological: Negative for headaches.  Psychiatric/Behavioral: Negative for confusion.  All other systems reviewed and are negative.     Allergies  Tagamet  Home Medications   Prior to Admission medications   Medication Sig Start Date End Date Taking? Authorizing Provider  acetaminophen-codeine (TYLENOL #3) 300-30 MG per tablet Take 1-2 tablets by mouth every 6 (six) hours as needed for moderate pain. 09/05/14  Yes Domenic Moras, PA-C  apixaban (ELIQUIS) 5 MG TABS tablet Take 1 tablet (5 mg total) by mouth  2 (two) times daily. 08/09/14  Yes Rogelia Mire, NP  B Complex-C (SUPER B COMPLEX PO) Take 1 tablet by mouth daily.   Yes Historical Provider, MD  carvedilol (COREG) 12.5 MG tablet Take 1.5 tablets (18.75 mg total) by mouth 2 (two) times daily. 11/14/14  Yes Dayna N Dunn, PA-C  diltiazem (CARDIZEM CD) 180 MG 24 hr capsule Take 1 capsule (180 mg total) by mouth daily. 11/14/14  Yes Dayna N Dunn, PA-C  ESTRACE VAGINAL 0.1 MG/GM vaginal cream Place 1 Applicatorful vaginally every Monday, Wednesday, and Friday.  08/23/14  Yes Historical Provider, MD  Multiple Vitamin (MULTIVITAMIN PO) Take 1 tablet by mouth daily.    Yes Historical Provider, MD  nitroGLYCERIN (NITROSTAT) 0.4 MG SL tablet Place 1 tablet (0.4 mg total) under the tongue every 5 (five) minutes as needed for chest pain. 11/14/14  Yes Dayna N Dunn, PA-C  potassium chloride SA (KLOR-CON M20) 20 MEQ tablet Take 1 tablet (20 mEq total) by mouth daily. 09/28/14  Yes Dayna N Dunn, PA-C  furosemide (LASIX) 20 MG tablet Take 1 tablet (20 mg total) by mouth daily. 11/19/14   [name], MD   BP 122/53 mmHg  Pulse 60  Temp(Src) 97.6 F (36.4 C) (Oral)  Resp 24  Ht 5\' 2"  (1.575 m)  Wt 110 lb (49.896 kg)  BMI 20.11 kg/m2  SpO2 95%   Physical Exam  Constitutional: She is oriented to person, place, and time. She appears well-developed and well-nourished. No distress.  HENT:  Head: Normocephalic and atraumatic.  Cardiovascular: Normal heart sounds.   No murmur heard. Irregularly irregular  Pulmonary/Chest: Effort normal and breath sounds normal. No respiratory distress. She has no wheezes. She has no rales.  Abdominal: Soft. Bowel sounds are normal. There is no tenderness. There is no rebound.  Musculoskeletal: She exhibits edema.  1+ bilateral lower extremity edema to the ankles  Neurological: She is alert and oriented to person, place, and time.  Skin: Skin is warm and dry.  Psychiatric: She has a normal mood and affect.  Nursing  note and vitals reviewed.   ED Course  Procedures (including critical care time) DIAGNOSTIC STUDIES: Oxygen Saturation is 95% on RA, adequate by my interpretation.    COORDINATION OF CARE: 12:40 AM-Discussed treatment plan with pt at bedside and pt agreed to plan.    Labs Review Labs Reviewed  BASIC METABOLIC PANEL - Abnormal; Notable for the following:    Glucose, Bld 146 (*)    BUN 24 (*)    Creatinine, Ser 1.07 (*)    GFR calc non Af Amer 49 (*)    GFR calc Af Amer 57 (*)    All other components within normal limits  CBC - Abnormal; Notable for the following:  RDW 17.5 (*)    All other components within normal limits  BRAIN NATRIURETIC PEPTIDE - Abnormal; Notable for the following:    B Natriuretic Peptide 1196.4 (*)    All other components within normal limits  TROPONIN I    Imaging Review Dg Chest 2 View  11/18/2014  CLINICAL DATA:  Acute onset of chest discomfort.  Initial encounter. EXAM: CHEST  2 VIEW COMPARISON:  Chest radiograph performed 11/13/2014 FINDINGS: The lungs are well-aerated. Small bilateral pleural effusions are noted. Bibasilar airspace opacities raise question for mild interstitial edema. There is no evidence of pneumothorax. The heart is mildly enlarged. No acute osseous abnormalities are seen. IMPRESSION: Mild cardiomegaly. Small bilateral pleural effusions noted. Bibasilar airspace opacities raise question for mild interstitial edema. Electronically Signed   By: Garald Balding M.D.   On: 11/18/2014 22:56   I have personally reviewed and evaluated these images and lab results as part of my medical decision-making.   EKG Interpretation   Date/Time:  Saturday November 18 2014 22:23:19 EST Ventricular Rate:  99 PR Interval:    QRS Duration: 70 QT Interval:  342 QTC Calculation: 438 R Axis:   108 Text Interpretation:  Atrial fibrillation Low voltage QRS Septal infarct ,  age undetermined Lateral infarct , age undetermined Abnormal ECG Confirmed   by HORTON  MD, Mead ([id number]) on 11/19/2014 12:10:38 AM      MDM   Final diagnoses:  Chronic atrial fibrillation (HCC)  Acute on chronic diastolic heart failure ([place])    Patient presents with generalized fatigue, occasional shortness of breath. Ongoing and worsening over the last month. Recent switch from metoprolol to carvedilol.  EKG shows rate-controlled atrial fibrillation. Patient had a similar workup less than one week ago and third visit in 3 weeks. At that time she was thought to be in mild congestive heart failure. She was given Lasix. She has pierced to have persistent pleural effusions and mild volume overload. She likely needs increase in Lasix. She was given IV Lasix here. She does have a cardiology appointment on Tuesday.  Patient ambulated. She desaturated to the low 80s with ambulation. She was not short of breath. Given multiple visits, increasing BNP and evidence of volume overload, and desaturation, will admit patient for observation for diuresis. She does have a history of diastolic heart failure which is likely exacerbated by chronic atrial fibrillation.  I personally performed the services described in this documentation, which was scribed in my presence. The recorded information has been reviewed and is accurate.     [name], MD 11/19/14 [phone]

## 2014-11-19 NOTE — H&P (Signed)
[facility] Admission History and Physical       [name] [id number] DOB: 1935/10/18 DOA: 11/18/2014  Referring physician: EDP PCP: Henrine Screws, MD  Specialists:   Chief Complaint: Occasional SOB, fatigue, and Increased Swelling lower Leg Edema  HPI: Donna [name] is a 79 y.o. female with a history of Atrial Fibrillation, Diastolic CHF ( with EF [id number] on 2D ECHO 08/09/2014), Stage II CKD who presents to the ED with complaints of a funny feeling in her chest , occasional SOB and increased fatigue and edema of both legs over the past 3 days.    She denies any chest pain.   She had hypoxia into the 80's while in the ED and was placed on 2 liters [place] O2.    She was found to have evidence of mild CHF on Chest X-ray and an increased BNP of 1196.4.    She was administered 40 mg of IV Lasix X 1 dose and referred for admission.       Review of Systems:  Constitutional: No Weight Loss, No Weight Gain, Night Sweats, Fevers, Chills, Dizziness, Light Headedness,+Fatigue, or Generalized Weakness HEENT: No Headaches, Difficulty Swallowing,Tooth/Dental Problems,Sore Throat,  No Sneezing, Rhinitis, Ear Ache, Nasal Congestion, or Post Nasal Drip,  Cardio-vascular:  No Chest pain, Orthopnea, PND, +Edema in Lower Extremities, Anasarca, Dizziness, Palpitations  Resp:   +Dyspnea, No DOE, No Productive Cough, No Non-Productive Cough, No Hemoptysis, No Wheezing.    GI: No Heartburn, Indigestion, Abdominal Pain, Nausea, Vomiting, Diarrhea, Constipation, Hematemesis, Hematochezia, Melena, Change in Bowel Habits,  Loss of Appetite  GU: No Dysuria, No Change in Color of Urine, No Urgency or Urinary Frequency, No Flank pain.  Musculoskeletal: No Joint Pain or Swelling, No Decreased Range of Motion, No Back Pain.  Neurologic: No Syncope, No Seizures, Muscle Weakness, Paresthesia, Vision Disturbance or Loss, No Diplopia, No Vertigo, No Difficulty Walking,  Skin: No Rash or Lesions. Psych:  No Change in Mood or Affect, No Depression or Anxiety, No Memory loss, No Confusion, or Hallucinations   Past Medical History  Diagnosis Date  . Essential hypertension   . Atherosclerosis of renal artery (HCC)     a. s/p R RA stenting;  b. 04/2014 Renal Art duplex: Patent R RA stent, <60% L RA stenosis. Followed by [facility].  . Fibrocystic breast   . Fibromuscular dysplasia ([place])   . Carotid arterial disease ([place])     a. 01/2014 Carotid U/S: RICA [id number], LICA [id number]. Followed by [facility].  . Paroxysmal atrial fibrillation ([place])     a. Dx 07/26/2014, CHA2DS2VASc = 5-->Eliquis;  c. 08/2014 Echo: EF 55-60%, no rwma, mildly dil LA/RA, mod-sev TR, PASP 70mmHg. b. s/p DCCV 08/2014. c. back in atrial flutter 09/2014 -> rate control pursued.  . Paroxysmal atrial flutter ([place])     a. Dx 07/26/2014, CHA2DS2VASc = 5-->Eliquis;  c. 08/2014 Echo: EF 55-60%, no rwma, mildly dil LA/RA, mod-sev TR, PASP 8mmHg. b. s/p DCCV 08/2014. c. back in atrial flutter 09/2014 -> rate control pursued.  . CKD (chronic kidney disease), stage III     Stage II/III  . Tricuspid regurgitation     a. Echo 08/2014: mod-severe.  . Mitral regurgitation     a. Echo 08/2014: mild MR.  Marland Kitchen Hyperglycemia      Past Surgical History  Procedure Laterality Date  . Renal artery stent  2008    Right renal artery by Dr. Drucie Opitz  . Hemorroidectomy    . Tubal ligation  1970's  .  Tonsillectomy    . Cardioversion N/A 09/01/2014    Procedure: CARDIOVERSION;  Surgeon: Josue Hector, MD;  Location: [facility] ENDOSCOPY;  Service: Cardiovascular;  Laterality: N/A;      Prior to Admission medications   Medication Sig Start Date End Date Taking? Authorizing Provider  acetaminophen-codeine (TYLENOL #3) 300-30 MG per tablet Take 1-2 tablets by mouth every 6 (six) hours as needed for moderate pain. 09/05/14  Yes Domenic Moras, PA-C  apixaban (ELIQUIS) 5 MG TABS tablet Take 1 tablet (5 mg total) by mouth 2 (two) times daily. 08/09/14  Yes Rogelia Mire, NP  B Complex-C  (SUPER B COMPLEX PO) Take 1 tablet by mouth daily.   Yes Historical Provider, MD  carvedilol (COREG) 12.5 MG tablet Take 1.5 tablets (18.75 mg total) by mouth 2 (two) times daily. 11/14/14  Yes Dayna N Dunn, PA-C  diltiazem (CARDIZEM CD) 180 MG 24 hr capsule Take 1 capsule (180 mg total) by mouth daily. 11/14/14  Yes Dayna N Dunn, PA-C  ESTRACE VAGINAL 0.1 MG/GM vaginal cream Place 1 Applicatorful vaginally every Monday, Wednesday, and Friday.  08/23/14  Yes Historical Provider, MD  Multiple Vitamin (MULTIVITAMIN PO) Take 1 tablet by mouth daily.    Yes Historical Provider, MD  nitroGLYCERIN (NITROSTAT) 0.4 MG SL tablet Place 1 tablet (0.4 mg total) under the tongue every 5 (five) minutes as needed for chest pain. 11/14/14  Yes Dayna N Dunn, PA-C  potassium chloride SA (KLOR-CON M20) 20 MEQ tablet Take 1 tablet (20 mEq total) by mouth daily. 09/28/14  Yes Dayna N Dunn, PA-C  furosemide (LASIX) 20 MG tablet Take 1 tablet (20 mg total) by mouth daily. 11/19/14   [name], MD     Allergies  Allergen Reactions  . Tagamet [Cimetidine] Hives    Social History:  reports that she has never smoked. She has never used smokeless tobacco. She reports that she drinks alcohol. She reports that she does not use illicit drugs.     Family History  Problem Relation Age of Onset  . Stroke Mother 60  . Diabetes Mother   . Hypertension Mother   . Aneurysm Father     abdominal aortic aneurysm  . Diabetes Father   . Hypertension Father   . Cancer Sister 73    breast cancer  . Heart attack Father        Physical Exam:  GEN:  Pleasant Thin Well Developed  79 y.o. African American  female examined and in no acute distress; cooperative with exam Filed Vitals:   11/19/14 0245 11/19/14 0330 11/19/14 0415 11/19/14 0415  BP: 122/53 119/83 136/78   Pulse:  80  99  Temp:      TempSrc:      Resp: 24 20 16 22   Height:      Weight:      SpO2:  94% 98% 95%   Blood pressure 136/78, pulse 99,  temperature 97.6 F (36.4 C), temperature source Oral, resp. rate 22, height 5\' 2"  (1.575 m), weight 49.896 kg (110 lb), SpO2 95 %. PSYCH: She is alert and oriented x4; does not appear anxious does not appear depressed; affect is normal HEENT: Normocephalic and Atraumatic, Mucous membranes pink; PERRLA; EOM intact; Fundi:  Benign;  No scleral icterus, Nares: Patent, Oropharynx: Clear, Fair Dentition,    Neck:  FROM, No Cervical Lymphadenopathy nor Thyromegaly or Carotid Bruit; No JVD; Breasts:: Not examined CHEST WALL: No tenderness CHEST: Normal respiration, clear to auscultation bilaterally HEART: Irregular Rhythm;  no murmurs rubs or gallops BACK: No kyphosis or scoliosis; No CVA tenderness ABDOMEN: Positive Bowel Sounds, Scaphoid, Soft Non-Tender, No Rebound or Guarding; No Masses, No Organomegaly. Rectal Exam: Not done EXTREMITIES: No Cyanosis, Clubbing, 2+ BLE Edema; No Ulcerations. Genitalia: not examined PULSES: 2+ and symmetric SKIN: Normal hydration no rash or ulceration CNS:  Alert and Oriented x 4, No Focal Deficits Vascular: pulses palpable throughout    Labs on Admission:  Basic Metabolic Panel:  Recent Labs Lab 11/13/14 1005 11/18/14 2233  NA 139 138  K 3.5 4.7  CL 107 110  CO2 21* 22  GLUCOSE 140* 146*  BUN 24* 24*  CREATININE 1.00 1.07*  CALCIUM 9.9 9.7   Liver Function Tests: No results for input(s): AST, ALT, ALKPHOS, BILITOT, PROT, ALBUMIN in the last 168 hours. No results for input(s): LIPASE, AMYLASE in the last 168 hours. No results for input(s): AMMONIA in the last 168 hours. CBC:  Recent Labs Lab 11/13/14 1005 11/18/14 2233  WBC 4.2 4.2  HGB 13.4 13.6  HCT 39.4 40.2  MCV 93.8 95.9  PLT 167 160   Cardiac Enzymes:  Recent Labs Lab 11/18/14 2233  TROPONINI <0.03    BNP (last 3 results)  Recent Labs  11/06/14 1006 11/13/14 1005 11/19/14 0036  BNP 927.0* 820.6* 1196.4*    ProBNP (last 3 results) No results for input(s): PROBNP  in the last 8760 hours.  CBG: No results for input(s): GLUCAP in the last 168 hours.  Radiological Exams on Admission: Dg Chest 2 View  11/18/2014  CLINICAL DATA:  Acute onset of chest discomfort.  Initial encounter. EXAM: CHEST  2 VIEW COMPARISON:  Chest radiograph performed 11/13/2014 FINDINGS: The lungs are well-aerated. Small bilateral pleural effusions are noted. Bibasilar airspace opacities raise question for mild interstitial edema. There is no evidence of pneumothorax. The heart is mildly enlarged. No acute osseous abnormalities are seen. IMPRESSION: Mild cardiomegaly. Small bilateral pleural effusions noted. Bibasilar airspace opacities raise question for mild interstitial edema. Electronically Signed   By: Garald Balding M.D.   On: 11/18/2014 22:56     EKG: Independently reviewed. Atrial Fibrillation rate =99    Assessment/Plan:   79 y.o. female with  Principal Problem:   1.     Acute on chronic diastolic heart failure (HCC)      Active Problems:   2.    Chronic atrial fibrillation (HCC)- rate controlled    Contiue Diltiazem and Carvedilol Rx     3.    CKD (chronic kidney disease), stage III- or Stage II- Hx of RAS   Monitor BUN/Cr     4.    Essential hypertension   Continue Diltiazem and Carvedilol Rx   Monitor BPs     5.     DVT Prophylaxis   Lovnenox    Code Status:     FULL CODE        Family Communication:  No Family Present    Disposition Plan:    Inpatient Status        Time spent:  [place] Pager [phone]   If [place] Please Contact the Day Rounding Team MD for [facility]  If 7PM-7AM, Please Contact Night-Floor Coverage  [email or web address] Password [email or web address] 11/19/2014, 4:51 AM     ADDENDUM:   Patient was seen and examined on 11/19/2014

## 2014-11-19 NOTE — Progress Notes (Signed)
Patient admitted after midnight. Chart reviewed. Patient examined. Continue IV lasix.  Doree Barthel, MD [facility] [email or web address]

## 2014-11-19 NOTE — ED Notes (Signed)
Attempted report 

## 2014-11-19 NOTE — ED Notes (Signed)
Dr. Horton at the bedside.  

## 2014-11-19 NOTE — Progress Notes (Signed)
   11/19/14 0510  Vitals  Temp 97.4 F (36.3 C)  Temp Source Oral  BP (!) 136/97 mmHg  BP Location Right Arm  BP Method Automatic  Patient Position (if appropriate) Sitting  Pulse Rate 95  Pulse Rate Source Dinamap  Resp 20  Oxygen Therapy  SpO2 99 %  O2 Device Room Air  PCA/Epidural/Spinal Assessment  Respiratory Pattern Regular;Unlabored  Height and Weight  Height 5\' 2"  (1.575 m)  Weight 48.671 kg (107 lb 4.8 oz) (ScaleA)  Type of Scale Used Standing  BSA (Calculated - sq m) 1.46 sq meters  BMI (Calculated) 19.7  Weight in (lb) to have BMI = 25 136.4  admitted pt to rm 3E09 from ED, pt alert and oriented, denied pain at this time, admission assessment done, orders carried out. Will monitor.

## 2014-11-20 DIAGNOSIS — I5033 Acute on chronic diastolic (congestive) heart failure: Secondary | ICD-10-CM

## 2014-11-20 DIAGNOSIS — I10 Essential (primary) hypertension: Secondary | ICD-10-CM

## 2014-11-20 DIAGNOSIS — E876 Hypokalemia: Secondary | ICD-10-CM

## 2014-11-20 DIAGNOSIS — I482 Chronic atrial fibrillation: Secondary | ICD-10-CM

## 2014-11-20 DIAGNOSIS — I481 Persistent atrial fibrillation: Secondary | ICD-10-CM

## 2014-11-20 DIAGNOSIS — I455 Other specified heart block: Secondary | ICD-10-CM

## 2014-11-20 LAB — BASIC METABOLIC PANEL
ANION GAP: 8 (ref 5–15)
Anion gap: 10 (ref 5–15)
BUN: 19 mg/dL (ref 6–20)
BUN: 21 mg/dL — AB (ref 6–20)
CALCIUM: 9.5 mg/dL (ref 8.9–10.3)
CALCIUM: 9.7 mg/dL (ref 8.9–10.3)
CHLORIDE: 104 mmol/L (ref 101–111)
CO2: 25 mmol/L (ref 22–32)
CO2: 27 mmol/L (ref 22–32)
CREATININE: 1.1 mg/dL — AB (ref 0.44–1.00)
CREATININE: 1.22 mg/dL — AB (ref 0.44–1.00)
Chloride: 106 mmol/L (ref 101–111)
GFR calc Af Amer: 55 mL/min — ABNORMAL LOW (ref >60)
GFR calc Af Amer: 48 mL/min — ABNORMAL LOW (ref >60)
GFR calc non Af Amer: 42 mL/min — ABNORMAL LOW (ref >60)
GFR, EST NON AFRICAN AMERICAN: 47 mL/min — AB (ref >60)
GLUCOSE: 119 mg/dL — AB (ref 65–99)
GLUCOSE: 144 mg/dL — AB (ref 65–99)
Potassium: 3.1 mmol/L — ABNORMAL LOW (ref 3.5–5.1)
Potassium: 3.9 mmol/L (ref 3.5–5.1)
Sodium: 139 mmol/L (ref 135–145)
Sodium: 141 mmol/L (ref 135–145)

## 2014-11-20 LAB — MAGNESIUM: Magnesium: 1.7 mg/dL (ref 1.7–2.4)

## 2014-11-20 MED ORDER — POTASSIUM CHLORIDE CRYS ER 20 MEQ PO TBCR: 40.0000 meq | EXTENDED_RELEASE_TABLET | Freq: Two times a day (BID) | ORAL | Status: DC

## 2014-11-20 MED ORDER — POTASSIUM CHLORIDE CRYS ER 20 MEQ PO TBCR
40.0000 meq | EXTENDED_RELEASE_TABLET | Freq: Once | ORAL | Status: AC
  Administered 2014-11-20: 40 meq via ORAL
  Filled 2014-11-20: qty 2

## 2014-11-20 MED ORDER — CARVEDILOL 12.5 MG PO TABS
12.5000 mg | ORAL_TABLET | Freq: Two times a day (BID) | ORAL | Status: DC
Start: 2014-11-20 — End: 2014-11-22
  Administered 2014-11-20 – 2014-11-21 (×3): 12.5 mg via ORAL
  Filled 2014-11-20 (×4): qty 1

## 2014-11-20 NOTE — Consult Note (Addendum)
CARDIOLOGY CONSULT NOTE   Patient ID: MOZEL PSENCIK MRN: [id number] DOB/AGE: [birth year] 79 y.o.  Admit date: 11/18/2014  Primary Physician   Henrine Screws, MD Primary Cardiologist   Dr. Tamala Julian Reason for Consultation   Acute diastolic CHF and afib (123456 sec pause @ 5:35am - 11/20/14)  HPI: Donna [name] is a 79 y.o. female with a history of hypertension, chronic diastolic heart failure, renal artery stenosis (followed by [facility]), moderate carotid arterial disease 01/2014 (followed by [facility]), paroxysmal atrial fibrillation/flutter, probable CKD stage II-III, valvular heart disease (mod-severe TR and mild MR by echo 08/2014) who consulted for diastolic CHF and afib.   With regard to recent history, her AF was diagnosed in July 2016. She was supposed to be discharged with Xarelto and BB therapy but the patient didn't recall taking this. She was seen back in August and was started on Eliquis. The case was discussed with Dr. Caryl Comes who felt that since she had atrial fib and atypical atrial flutter, she would not be an ablation candidate. The plan was to bring her back in for DCCV in 3-4 weeks.   Last 2D Echo 08/09/14: EF 55-60%, mild biatrial enlargement, mild MR, mod-severe TR, mild-mod elevation in pulm pressure. She was then seen in the ER 08/16/14 complaining of chest pain. Troponin was negative and chest pain was felt atypical by EDP thus she was discharged with plan for outpatient follow-up. Lexiscan nuclear stress test was done 08/2014 which was negative for ischemia. Diltiazem was added for elevated rate and she underwent DCCV 09/01/14. When seen back in the office on 09/27/14, she was back in atrial flutter with HR low 100s but was completely asymptomatic. Strategies of rhythm vs rate control were discussed with the patient and in the absence of symptoms she elected to continue rate control.  Diltiazem was further increased however patient never took the higher dose.  Last seen in clinic 11/12/14  and her diltiazem increased to 180mg  and continued coreg 12.5mg  BID. Med reconciliation done with the patient, her husband, and daughter. The patient is schedule to f/u in AFib clinic tomorrow (11/21/14) for consider antiarrhythmic therapy if no improvement.   The came to ED midnight 11/12 for "funny sensation " in chest chest with intermittent SOB, increased fatigue and LE edema. She had hypoxia into the 80's while in the ED, evidence of mild CHF on Chest X-ray and an increased BNP of 1196.4.she was admitted for diuresis. Diuresed 1.4 L so far. On IV lasix 40mg  BID. EKG showed afib at rate of 99 bpm. Coreg increased to 18.75mg  BID on admission with continuation of Carzidem CD 180mg . Last night patient had few episode of pause (4.38 sec 02:35am; 4:20 sec 3:02am and 4.51 sec 5:35am). The patient was asymptomatic and sleeping at that time. Currently feeling well. No complain. The patient denies nausea, vomiting, fever, chest pain,  shortness of breath, orthopnea, PND, dizziness, syncope, cough, congestion, abdominal pain, hematochezia, melena, lower extremity edema.   Past Medical History  Diagnosis Date  . Essential hypertension   . Atherosclerosis of renal artery (HCC)     a. s/p R RA stenting;  b. 04/2014 Renal Art duplex: Patent R RA stent, <60% L RA stenosis. Followed by [facility].  . Fibrocystic breast   . Fibromuscular dysplasia ([place])   . Carotid arterial disease ([place])     a. 01/2014 Carotid U/S: RICA [id number], LICA [id number]. Followed by [facility].  . Paroxysmal atrial fibrillation ([place])     a. Dx 07/26/2014, CHA2DS2VASc =  5-->Eliquis;  c. 08/2014 Echo: EF 55-60%, no rwma, mildly dil LA/RA, mod-sev TR, PASP 18mmHg. b. s/p DCCV 08/2014. c. back in atrial flutter 09/2014 -> rate control pursued.  . Paroxysmal atrial flutter ([place])     a. Dx 07/26/2014, CHA2DS2VASc = 5-->Eliquis;  c. 08/2014 Echo: EF 55-60%, no rwma, mildly dil LA/RA, mod-sev TR, PASP 28mmHg. b. s/p DCCV 08/2014. c. back in atrial flutter 09/2014 -> rate  control pursued.  . CKD (chronic kidney disease), stage III     Stage II/III  . Tricuspid regurgitation     a. Echo 08/2014: mod-severe.  . Mitral regurgitation     a. Echo 08/2014: mild MR.  Marland Kitchen Hyperglycemia      Past Surgical History  Procedure Laterality Date  . Renal artery stent  2008    Right renal artery by Dr. Drucie Opitz  . Hemorroidectomy    . Tubal ligation  1970's  . Tonsillectomy    . Cardioversion N/A 09/01/2014    Procedure: CARDIOVERSION;  Surgeon: Josue Hector, MD;  Location: [facility] ENDOSCOPY;  Service: Cardiovascular;  Laterality: N/A;    Allergies  Allergen Reactions  . Tagamet [Cimetidine] Hives    I have reviewed the patient's current medications . apixaban  5 mg Oral BID  . carvedilol  18.75 mg Oral BID WC  . diltiazem  180 mg Oral Daily  . furosemide  40 mg Intravenous BID  . potassium chloride SA  40 mEq Oral BID  . sodium chloride  3 mL Intravenous Q12H  . sodium chloride  3 mL Intravenous Q12H  . sodium chloride  3 mL Intravenous Q12H     sodium chloride, sodium chloride, acetaminophen **OR** acetaminophen, alum & mag hydroxide-simeth, nitroGLYCERIN, ondansetron **OR** ondansetron (ZOFRAN) IV, sodium chloride, sodium chloride  Prior to Admission medications   Medication Sig Start Date End Date Taking? Authorizing Provider  acetaminophen-codeine (TYLENOL #3) 300-30 MG per tablet Take 1-2 tablets by mouth every 6 (six) hours as needed for moderate pain. 09/05/14  Yes Domenic Moras, PA-C  apixaban (ELIQUIS) 5 MG TABS tablet Take 1 tablet (5 mg total) by mouth 2 (two) times daily. 08/09/14  Yes Rogelia Mire, NP  B Complex-C (SUPER B COMPLEX PO) Take 1 tablet by mouth daily.   Yes Historical Provider, MD  carvedilol (COREG) 12.5 MG tablet Take 1.5 tablets (18.75 mg total) by mouth 2 (two) times daily. 11/14/14  Yes Dayna N Dunn, PA-C  diltiazem (CARDIZEM CD) 180 MG 24 hr capsule Take 1 capsule (180 mg total) by mouth daily. 11/14/14  Yes Dayna N Dunn, PA-C    ESTRACE VAGINAL 0.1 MG/GM vaginal cream Place 1 Applicatorful vaginally every Monday, Wednesday, and Friday.  08/23/14  Yes Historical Provider, MD  Multiple Vitamin (MULTIVITAMIN PO) Take 1 tablet by mouth daily.    Yes Historical Provider, MD  nitroGLYCERIN (NITROSTAT) 0.4 MG SL tablet Place 1 tablet (0.4 mg total) under the tongue every 5 (five) minutes as needed for chest pain. 11/14/14  Yes Dayna N Dunn, PA-C  potassium chloride SA (KLOR-CON M20) 20 MEQ tablet Take 1 tablet (20 mEq total) by mouth daily. 09/28/14  Yes Dayna N Dunn, PA-C  furosemide (LASIX) 20 MG tablet Take 1 tablet (20 mg total) by mouth daily. 11/19/14   [name], MD     Social History   Social History  . Marital Status: Married    Spouse Name: N/A  . Number of Children: N/A  . Years of Education: N/A  Occupational History  . Not on file.   Social History Main Topics  . Smoking status: Never Smoker   . Smokeless tobacco: Never Used  . Alcohol Use: Yes  . Drug Use: No  . Sexual Activity: Yes    Birth Control/ Protection: Surgical     Comment: BTL   Other Topics Concern  . Not on file   Social History Narrative    Family Status  Relation Status Death Age  . Mother Deceased   . Father Deceased   . Sister Alive   . Maternal Grandmother Deceased   . Maternal Grandfather Deceased   . Paternal Grandmother Deceased   . Paternal Grandfather Deceased    Family History  Problem Relation Age of Onset  . Stroke Mother 59  . Diabetes Mother   . Hypertension Mother   . Aneurysm Father     abdominal aortic aneurysm  . Diabetes Father   . Hypertension Father   . Cancer Sister 43    breast cancer  . Heart attack Father        ROS:  Full 14 point review of systems complete and found to be negative unless listed above.  Physical Exam: Blood pressure 133/76, pulse 78, temperature 97.8 F (36.6 C), temperature source Oral, resp. rate 18, height 5\' 2"  (1.575 m), weight 105 lb 12.8 oz (47.991  kg), SpO2 95 %.  General: Well developed, well nourished, female in no acute distress Head: Eyes PERRLA, No xanthomas. Normocephalic and atraumatic, oropharynx without edema or exudate.  Lungs: Resp regular and unlabored, CTA. Heart: Ir Ir no s3, s4, or murmurs..   Neck: No carotid bruits. No lymphadenopathy.  + JVD. Abdomen: Bowel sounds present, abdomen soft and non-tender without masses or hernias noted. Msk:  No spine or cva tenderness. No weakness, no joint deformities or effusions. Extremities: No clubbing, cyanosis or edema. DP/PT/Radials 2+ and equal bilaterally. Neuro: Alert and oriented X 3. No focal deficits noted. Psych:  Good affect, responds appropriately Skin: No rashes or lesions noted.  Labs:   Lab Results  Component Value Date   WBC 4.6 11/19/2014   HGB 14.8 11/19/2014   HCT 42.5 11/19/2014   MCV 94.4 11/19/2014   PLT 146* 11/19/2014   No results for input(s): INR in the last 72 hours.  Recent Labs Lab 11/20/14 0428  NA 139  K 3.1*  CL 106  CO2 25  BUN 19  CREATININE 1.10*  CALCIUM 9.5  GLUCOSE 119*   No results found for: MG  Recent Labs  11/18/14 2233  TROPONINI <0.03    TSH  Date/Time Value Ref Range Status  11/19/2014 12:50 PM 0.443 0.350 - 4.500 uIU/mL Final  08/09/2014 03:40 PM 0.58 0.35 - 4.50 uIU/mL Final   No results found for: VITAMINB12, FOLATE, FERRITIN, TIBC, IRON, RETICCTPCT  Echo: 08/09/14 LV EF: 55% -  60%  ------------------------------------------------------------------- Indications:   (I48.91).  ------------------------------------------------------------------- History:  PMH: Acquired from the patient and from the patient&'s chart. Atrial fibrillation. Risk factors: Hypertension.  ------------------------------------------------------------------- Study Conclusions  - Left ventricle: The cavity size was normal. Wall thickness was increased in a pattern of mild LVH. Systolic function was normal. The  estimated ejection fraction was in the range of 55% to 60%. Wall motion was normal; there were no regional wall motion abnormalities. - Mitral valve: There was mild regurgitation. - Left atrium: The atrium was mildly dilated. - Right atrium: The atrium was mildly dilated. - Tricuspid valve: There was moderate-severe regurgitation. - Pulmonary  arteries: Systolic pressure was mildly increased. PA peak pressure: 39 mm Hg (S). - Pericardium, extracardiac: A trivial pericardial effusion was identified.  Impressions:  - Normal LV function; mild biatrial enlargement; mild MR; moderate to severe TR; mild to moderate elevation in pulmonary pressure.   ECG:   Vent. rate 99 BPM PR interval * ms QRS duration 70 ms QT/QTc 342/438 ms  Radiology:  Dg Chest 2 View  11/18/2014  CLINICAL DATA:  Acute onset of chest discomfort.  Initial encounter. EXAM: CHEST  2 VIEW COMPARISON:  Chest radiograph performed 11/13/2014 FINDINGS: The lungs are well-aerated. Small bilateral pleural effusions are noted. Bibasilar airspace opacities raise question for mild interstitial edema. There is no evidence of pneumothorax. The heart is mildly enlarged. No acute osseous abnormalities are seen. IMPRESSION: Mild cardiomegaly. Small bilateral pleural effusions noted. Bibasilar airspace opacities raise question for mild interstitial edema. Electronically Signed   By: Garald Balding M.D.   On: 11/18/2014 22:56    ASSESSMENT AND PLAN:     1. Acute on chronic diastolic heart failure (HCC) - Evidence of mild CHF on Chest X-ray and an increased BNP of 1196.4 on admission.  - Diuresed 1.4 L so far. On IV lasix 40mg  BID. Weight down 5lb (110-105). She appears euvolemic or close to that. She is on Lasix 20mg  MWF. Will stop lasix.   2. Paroxysmal atrial fibrillation/paroxysmal atrial flutter - Resumed Cardizem  CD 180mg  during last office visit 11/12/14. ?if patient taking current dose. Admitted with afib at rate of 99.  Coreg increased to 18.75mg  BID on admission with continuation of Carzidem CD 180mg . - Tele showed rate mostly in 80-90s. Last night patient had few pause above 3 sec, longest 4.51 sec at 5:35am. She was sleeping.  - Previously seen by Dr. Caryl Comes and felt not a good candidate for ablation. She has appointment in afib clinic tomorrow (11/15) for possible antiarrhythmic. Will reduce coreg to previous dose of 12.5mg  BID and will get EP consult for possible antiarrhythmic initiation.  - CHADSVASC score of 5. Continue Eliquis for anticoagulation.   3. CKD (chronic kidney disease), stage III -  Creatinine earlier this year when feeling well was 1.2-1.3 - Creatinine of 1.10. Seems stable    4. Essential hypertension - Stable and well controlled  5. Hypokalemia - On daily supplement. Will stop along with lasix. Recheck BMET tomorrow.   6. Hyperglycemia - Consider HgbA1c  7. Worsening fatigue  - Likely related to controlled afib. Concern for underlying problem as well. TSH normal.   8. Valvular heart disease  - mod-severe TR and mild MR by echo 08/2014  Signed: Bhagat,Bhavinkumar, PA 11/20/2014, 10:13 AM Pager [phone]  Co-Sign MD  Personally seen and examined. Agree with above. Appears to have symptomatic AFIB Will ask EP to consult for possible antiarrhythmic.  Stopping lasix IV, BNP chronically elevated. No signs of heart failure currently.  Supplement KCL. Pulling back coreg because of pause  Candee Furbish, MD

## 2014-11-20 NOTE — Progress Notes (Signed)
@  05:35am pt had 4.51sec paused, pt resting in bed asleep. BP=135/75 HR=74. Fredirick Maudlin NP made aware, no orders made at this time. Will continue to monitor.

## 2014-11-20 NOTE — Consult Note (Signed)
ELECTROPHYSIOLOGY CONSULT NOTE    Patient ID: Donna [name] MRN: [id number], DOB/AGE: 1935/04/14 79 y.o.  Admit date: 11/18/2014 Date of Consult: 11/20/2014   Primary Physician: Henrine Screws, MD Primary Cardiologist: Dr. Tamala Julian  Reason for Consultation: Atrial fibrillation, rate/rhythm control evaluation  HPI: Donna [name] is a 79 y.o. female who came to the ER c/o increasing LE swelling, admitted with a CHF exacerbation (diastolic).  She denies any over feelings of SOB, but her husband and the patient state that for a few months she hasn't been sleeping well, waking for no apparent reason, unclear if it is orthopnea.  She denies any CP, no overt palpitations but states sometimes just feels "funny" in he chest.  No dizziness, near syncope or syncope.  Her PMHx if of HTN, new onset AFib noted in July 2016, renal artery stenosis with stent in April 2016, CRI.  She feels well this morning.  Both the patient and her husband state that she is usually very active and full of energy, though in the last 3 months or so, just not feeling like herself, no energy, noting intermittent swelling to her ankles/LE, and episodes of "funny feelings" in her chest.  She has made a couple ER visits since July when she was noted to be in AFib it appears for the first time.  She also reports unintentional weight loss over these past few months as well, despite what she says eating well.  She has had myoview with no ischemia in August, and an echo with normal LVEF, noting mod-severe TR, mild MR, her LA 104mm, PA 68mmHg  She has been on the current meds, diltiazem with her carvedlol, Eliquis for her AFib, it does not appear that she has been on any AAD.  There also appears to be some confusion at home on what she was actually taking and how much at a more recent office visit.   She had  DCCV on 09/01/14 converting to SR but noted in then office 09/27/14 to be back in AFib. It is unclear if she felt any clear  difference in SR then, she reported feeling very well at her Sept visit and was in AF at that time.   Past Medical History  Diagnosis Date  . Essential hypertension   . Atherosclerosis of renal artery (HCC)     a. s/p R RA stenting;  b. 04/2014 Renal Art duplex: Patent R RA stent, <60% L RA stenosis. Followed by [facility].  . Fibrocystic breast   . Fibromuscular dysplasia ([place])   . Carotid arterial disease ([place])     a. 01/2014 Carotid U/S: [id number] [id number], [id number] [id number]. Followed by [facility].  . Paroxysmal atrial fibrillation ([place])     a. Dx 07/26/2014, CHA2DS2VASc = 5-->Eliquis;  c. 08/2014 Echo: EF 55-60%, no rwma, mildly dil LA/RA, mod-sev TR, PASP 49mmHg. b. s/p DCCV 08/2014. c. back in atrial flutter 09/2014 -> rate control pursued.  . Paroxysmal atrial flutter ([place])     a. Dx 07/26/2014, CHA2DS2VASc = 5-->Eliquis;  c. 08/2014 Echo: EF 55-60%, no rwma, mildly dil LA/RA, mod-sev TR, PASP 95mmHg. b. s/p DCCV 08/2014. c. back in atrial flutter 09/2014 -> rate control pursued.  . CKD (chronic kidney disease), stage III     Stage II/III  . Tricuspid regurgitation     a. Echo 08/2014: mod-severe.  . Mitral regurgitation     a. Echo 08/2014: mild MR.  Marland Kitchen Hyperglycemia      Surgical History:  Past Surgical History  Procedure Laterality Date  . Renal artery stent  2008    Right renal artery by Dr. Drucie Opitz  . Hemorroidectomy    . Tubal ligation  1970's  . Tonsillectomy    . Cardioversion N/A 09/01/2014    Procedure: CARDIOVERSION;  Surgeon: Josue Hector, MD;  Location[facility] ENDOSCOPY;  Service: Cardiovascular;  Laterality: N/A;     Prescriptions prior to admission  Medication Sig Dispense Refill Last Dose  . acetaminophen-codeine (TYLENOL #3) 300-30 MG per tablet Take 1-2 tablets by mouth every 6 (six) hours as needed for moderate pain. 15 tablet 0 6 months  . apixaban (ELIQUIS) 5 MG TABS tablet Take 1 tablet (5 mg total) by mouth 2 (two) times daily. 60 tablet 5 11/18/2014 at Unknown time  . B Complex-C (SUPER B  COMPLEX PO) Take 1 tablet by mouth daily.   11/18/2014 at Unknown time  . carvedilol (COREG) 12.5 MG tablet Take 1.5 tablets (18.75 mg total) by mouth 2 (two) times daily. 90 tablet 10 11/18/2014 at 2000  . diltiazem (CARDIZEM CD) 180 MG 24 hr capsule Take 1 capsule (180 mg total) by mouth daily. 90 capsule 3 11/18/2014 at Unknown time  . ESTRACE VAGINAL 0.1 MG/GM vaginal cream Place 1 Applicatorful vaginally every Monday, Wednesday, and Friday.    Past Week at Unknown time  . Multiple Vitamin (MULTIVITAMIN PO) Take 1 tablet by mouth daily.    11/18/2014 at Unknown time  . nitroGLYCERIN (NITROSTAT) 0.4 MG SL tablet Place 1 tablet (0.4 mg total) under the tongue every 5 (five) minutes as needed for chest pain. 25 tablet 3 several months  . potassium chloride SA (KLOR-CON M20) 20 MEQ tablet Take 1 tablet (20 mEq total) by mouth daily. 90 tablet 3 11/18/2014 at Unknown time    Inpatient Medications:  . apixaban  5 mg Oral BID  . carvedilol  12.5 mg Oral BID WC  . diltiazem  180 mg Oral Daily  . sodium chloride  3 mL Intravenous Q12H  . sodium chloride  3 mL Intravenous Q12H  . sodium chloride  3 mL Intravenous Q12H    Allergies:  Allergies  Allergen Reactions  . Tagamet [Cimetidine] Hives    Social History   Social History  . Marital Status: Married    Spouse Name: N/A  . Number of Children: N/A  . Years of Education: N/A   Occupational History  . Not on file.   Social History Main Topics  . Smoking status: Never Smoker   . Smokeless tobacco: Never Used  . Alcohol Use: Yes  . Drug Use: No  . Sexual Activity: Yes    Birth Control/ Protection: Surgical     Comment: BTL   Other Topics Concern  . Not on file   Social History Narrative     Family History  Problem Relation Age of Onset  . Stroke Mother 27  . Diabetes Mother   . Hypertension Mother   . Aneurysm Father     abdominal aortic aneurysm  . Diabetes Father   . Hypertension Father   . Cancer Sister 54     breast cancer  . Heart attack Father      Review of Systems: All other systems reviewed and are otherwise negative except as noted above.  Physical Exam: Filed Vitals:   11/20/14 0702 11/20/14 0740 11/20/14 0914 11/20/14 1158  BP: 129/71 133/76 133/76 108/71  Pulse: 100 60 78 94  Temp: 97.8 F (36.6 C) 97.8 F (36.6 C)  98.1 F (36.7 C)  TempSrc: Oral Oral  Oral  Resp: 18 18  18   Height:      Weight: 105 lb 12.8 oz (47.991 kg)     SpO2: 96% 95%  99%    GEN- The patient is thin, well appearing, alert and oriented x 3 today.   HEENT: normocephalic, atraumatic; sclera clear, conjunctiva pink; hearing intact; oropharynx clear; neck supple, no JVP Lymph- no cervical lymphadenopathy Lungs- Clear to ausculation bilaterally, normal work of breathing.  No wheezes, rales, rhonchi Heart- irregular rate and rhythm, soft systolic murmur, no rubs or gallops, PMI not laterally displaced GI- soft, non-tender, non-distended, bowel sounds present Extremities- no clubbing, cyanosis, trace edema b/l MS- no significant deformity or atrophy Skin- warm and dry, no rash or lesion Psych- euthymic mood, full affect Neuro- no gross deficits observed  Labs:   Lab Results  Component Value Date   WBC 4.6 11/19/2014   HGB 14.8 11/19/2014   HCT 42.5 11/19/2014   MCV 94.4 11/19/2014   PLT 146* 11/19/2014    Recent Labs Lab 11/20/14 0428  NA 139  K 3.1*  CL 106  CO2 25  BUN 19  CREATININE 1.10*  CALCIUM 9.5  GLUCOSE 119*      Radiology/Studies:  Dg Chest 2 View 11/18/2014  CLINICAL DATA:  Acute onset of chest discomfort.  Initial encounter. EXAM: CHEST  2 VIEW COMPARISON:  Chest radiograph performed 11/13/2014 FINDINGS: The lungs are well-aerated. Small bilateral pleural effusions are noted. Bibasilar airspace opacities raise question for mild interstitial edema. There is no evidence of pneumothorax. The heart is mildly enlarged. No acute osseous abnormalities are seen. IMPRESSION: Mild  cardiomegaly. Small bilateral pleural effusions noted. Bibasilar airspace opacities raise question for mild interstitial edema. Electronically Signed   By: Garald Balding M.D.   On: 11/18/2014 22:56      EKG: Afib, septal/lat infarct TELEMETRY: Afib, CVR, occasional brief pauses 2-3 seconds, overnight/early this morning up to 4.5seconds, during sleep  08/09/14: Echocardiogram: Study Conclusions - Left ventricle: The cavity size was normal. Wall thickness was increased in a pattern of mild LVH. Systolic function was normal. The estimated ejection fraction was in the range of 55% to 60%. Wall motion was normal; there were no regional wall motion abnormalities. - Mitral valve: There was mild regurgitation. - Left atrium: The atrium was mildly dilated. - Right atrium: The atrium was mildly dilated. - Tricuspid valve: There was moderate-severe regurgitation. - Pulmonary arteries: Systolic pressure was mildly increased. PA peak pressure: 39 mm Hg (S). - Pericardium, extracardiac: A trivial pericardial effusion was identified. Impressions: - Normal LV function; mild biatrial enlargement; mild MR; moderate to severe TR; mild to moderate elevation in pulmonary pressure.  08/28/14; Lexiscan stress test Overall Study Impression Myocardial perfusion is normal. This is a low risk study.    Assessment and Plan:  1. CHF, diastolic     She has been treated with IV lasix      c/w primary cardiology  2. Atrial fibrillation/flutter     Rate appears controlled, notes historically mention in an ER 110-120's, otherwise seems with fair rate control, asymptomatic pauses 2-3 seconds intermittently     Her carvedilol was up-titrated at admission for rates of about a 90-100, and today resumed to her previous dosing with pauses overnight (with sleep)     She has no history of syncope or symptoms of bradycardia     She has been feeling poorly since about the time she was found with  Afib           CHADS2 VAsc = 4     Hx of DCCV 09/01/14, back in AFlutter at [address] 09/27/14  Await final POC with Dr. Caryl Comes   Signed, Tommye Standard, PA-C 11/20/2014 12:43 PM   The patient gives a very difficult history. I am not sure the end of the role was going on but I suspect that her symptoms over the summer with variable degrees of exercise intolerance or recent manifestations of volume overload are concurrent with the persistence of her atrial fibrillation.  think it is also reasonable to try to restore sinus rhythm with the hopes that this association is in fact true. It is also possible that some of the medications being used i.e. diltiazem carvedilol and/or ELIQUIS Might be further contributed towards her symptoms.  Her history is challenging as there is discord and discrepancy as she tells her story and her daughters offer their insights.The latter also expressed concern about a weight loss which they described as 20 pounds however, on review of the medical record it appears to be 3-5 pounds over the last year.  We discussed the potential use of antiarrhythmic drugs including flecainide and dofetilide. Given the higher likelihood of sinus rhythm at the fact that she is in hospital with heart failure we have elected to pursue dofetilide. Her QTC was 430 ms.  Her potassium will require repletion as it is 3.1.

## 2014-11-20 NOTE — Progress Notes (Signed)
Patient had a 4.19 second pause. Patient is asymptomatic; vitals WNL. PA notified. No further orders at this time. Will continue to monitor.

## 2014-11-20 NOTE — Progress Notes (Signed)
[facility] PROGRESS NOTE  Donna [name] [id number] DOB: 07/05/1935 DOA: 11/18/2014 PCP: Henrine Screws, MD  Assessment/Plan:  Principal Problem:   Acute on chronic diastolic heart failure ([place]: Weight is decreasing nicely on Lasix 40 mg IV twice a day. Will continue. Still with leg edema. Creatinine is stable Active Problems:   Chronic atrial fibrillation ([place]): chadsvasc 5 on Eliquis. See below.   CKD (chronic kidney disease), stage III   Essential hypertension: controlled 4.5 second pause last night. Patient's rate control medications have been adjusted as an outpatient. Will consult cardiology to weigh in on atrial fibrillation, pons, acute diastolic heart failure. Primary cardiologist is Dr. Tamala Julian. Will check magnesium level. Potassium low and will try to keep above 4.  Hypokalemia: see above  Code Status:  full Family Communication:   daughters at bedside 11/13  Disposition Plan:  Home 1-3 days  Consultants:  Cardiology  Procedures:     Antibiotics:    HPI/Subjective:  no dyspnea. No complaints.   Objective: Filed Vitals:   11/20/14 0914  BP: 133/76  Pulse: 78  Temp:   Resp:     Intake/Output Summary (Last 24 hours) at 11/20/14 0932 Last data filed at 11/20/14 0845  Gross per 24 hour  Intake    720 ml  Output   1300 ml  Net   -580 ml   Filed Weights   11/18/14 2231 11/19/14 0510 11/20/14 0702  Weight: 49.896 kg (110 lb) 48.671 kg (107 lb 4.8 oz) 47.991 kg (105 lb 12.8 oz)    Exam:   General:   Alert, forgetful. Appropriate and cooperative. Breathing nonlabored. Talkative.  Cardiovascular:  irregularly irregular without murmurs gallops rubs   Respiratory: Clear to auscultation bilaterally without wheezes rhonchi or rales  Abdomen:  soft nontender nondistended   Ext:  1+ ankle and foot edema.   Basic Metabolic Panel:  Recent Labs Lab 11/13/14 1005 11/18/14 2233 11/20/14 0428  NA 139 138 139  K 3.5 4.7 3.1*  CL 107 110  106  CO2 21* 22 25  GLUCOSE 140* 146* 119*  BUN 24* 24* 19  CREATININE 1.00 1.07* 1.10*  CALCIUM 9.9 9.7 9.5   Liver Function Tests: No results for input(s): AST, ALT, ALKPHOS, BILITOT, PROT, ALBUMIN in the last 168 hours. No results for input(s): LIPASE, AMYLASE in the last 168 hours. No results for input(s): AMMONIA in the last 168 hours. CBC:  Recent Labs Lab 11/13/14 1005 11/18/14 2233 11/19/14 0613  WBC 4.2 4.2 4.6  HGB 13.4 13.6 14.8  HCT 39.4 40.2 42.5  MCV 93.8 95.9 94.4  PLT 167 160 146*   Cardiac Enzymes:  Recent Labs Lab 11/18/14 2233  TROPONINI <0.03   BNP (last 3 results)  Recent Labs  11/06/14 1006 11/13/14 1005 11/19/14 0036  BNP 927.0* 820.6* 1196.4*    ProBNP (last 3 results) No results for input(s): PROBNP in the last 8760 hours.  CBG: No results for input(s): GLUCAP in the last 168 hours.  No results found for this or any previous visit (from the past 240 hour(s)).   Studies: Dg Chest 2 View  11/18/2014  CLINICAL DATA:  Acute onset of chest discomfort.  Initial encounter. EXAM: CHEST  2 VIEW COMPARISON:  Chest radiograph performed 11/13/2014 FINDINGS: The lungs are well-aerated. Small bilateral pleural effusions are noted. Bibasilar airspace opacities raise question for mild interstitial edema. There is no evidence of pneumothorax. The heart is mildly enlarged. No acute osseous abnormalities are seen. IMPRESSION: Mild cardiomegaly. Small bilateral  pleural effusions noted. Bibasilar airspace opacities raise question for mild interstitial edema. Electronically Signed   By: Garald Balding M.D.   On: 11/18/2014 22:56    Scheduled Meds: . apixaban  5 mg Oral BID  . carvedilol  18.75 mg Oral BID WC  . diltiazem  180 mg Oral Daily  . furosemide  40 mg Intravenous BID  . potassium chloride SA  40 mEq Oral BID  . sodium chloride  3 mL Intravenous Q12H  . sodium chloride  3 mL Intravenous Q12H  . sodium chloride  3 mL Intravenous Q12H    Continuous Infusions:   Time spent: 35 minutes  [facility] [email or web address], password [facility] 11/20/2014, 9:32 AM  LOS: 1 day

## 2014-11-20 NOTE — Progress Notes (Signed)
   11/20/14 0240  Vitals  Temp 98.1 F (36.7 C)  Temp Source Oral  BP 112/85 mmHg  BP Location Left Arm  BP Method Automatic  Patient Position (if appropriate) Lying  Pulse Rate 79  Pulse Rate Source Dinamap  Resp 17  Oxygen Therapy  SpO2 98 %  O2 Device Room Air  Pt had 4.40 sec paused, pt asymptomatic, Fredirick Maudlin NP notified, no new orders made. Will continue to monitor.

## 2014-11-21 ENCOUNTER — Other Ambulatory Visit: Payer: Self-pay

## 2014-11-21 ENCOUNTER — Ambulatory Visit (HOSPITAL_COMMUNITY): Payer: Commercial Managed Care - HMO | Admitting: Nurse Practitioner

## 2014-11-21 LAB — BASIC METABOLIC PANEL
Anion gap: 9 (ref 5–15)
BUN: 21 mg/dL — ABNORMAL HIGH (ref 6–20)
CO2: 21 mmol/L — AB (ref 22–32)
Calcium: 9.6 mg/dL (ref 8.9–10.3)
Chloride: 109 mmol/L (ref 101–111)
Creatinine, Ser: 1.03 mg/dL — ABNORMAL HIGH (ref 0.44–1.00)
GFR calc non Af Amer: 51 mL/min — ABNORMAL LOW (ref >60)
GFR, EST AFRICAN AMERICAN: 59 mL/min — AB (ref >60)
GLUCOSE: 123 mg/dL — AB (ref 65–99)
Potassium: 4.2 mmol/L (ref 3.5–5.1)
Sodium: 139 mmol/L (ref 135–145)

## 2014-11-21 MED ORDER — SODIUM CHLORIDE 0.9 % IJ SOLN: 3.0000 mL | INTRAMUSCULAR | Status: DC | PRN

## 2014-11-21 MED ORDER — SODIUM CHLORIDE 0.9 % IJ SOLN
3.0000 mL | Freq: Two times a day (BID) | INTRAMUSCULAR | Status: DC
  Administered 2014-11-21: 3 mL via INTRAVENOUS

## 2014-11-21 MED ORDER — MAGNESIUM OXIDE 400 (241.3 MG) MG PO TABS
400.0000 mg | ORAL_TABLET | Freq: Two times a day (BID) | ORAL | Status: DC
  Administered 2014-11-21 – 2014-11-24 (×7): 400 mg via ORAL
  Filled 2014-11-21 (×7): qty 1

## 2014-11-21 MED ORDER — SODIUM CHLORIDE 0.9 % IV SOLN: 250.0000 mL | INTRAVENOUS | Status: DC | PRN

## 2014-11-21 MED ORDER — DOFETILIDE 125 MCG PO CAPS
125.0000 ug | ORAL_CAPSULE | Freq: Two times a day (BID) | ORAL | Status: DC
  Administered 2014-11-21 – 2014-11-23 (×5): 125 ug via ORAL
  Filled 2014-11-21 (×7): qty 1

## 2014-11-21 NOTE — Discharge Instructions (Addendum)
You were seen today for generalized fatigue.  You were noted to be in atrial fibrillation and also noted to have a small amount of fluid on her lungs. This can sometimes be related to heart failure or uncontrolled atrial fibrillation. You were given a dose of Lasix. You need to follow-up closely in clinic.  Atrial Fibrillation Atrial fibrillation is a type of irregular or rapid heartbeat (arrhythmia). In atrial fibrillation, the heart quivers continuously in a chaotic pattern. This occurs when parts of the heart receive disorganized signals that make the heart unable to pump blood normally. This can increase the risk for stroke, heart failure, and other heart-related conditions. There are different types of atrial fibrillation, including:  Paroxysmal atrial fibrillation. This type starts suddenly, and it usually stops on its own shortly after it starts.  Persistent atrial fibrillation. This type often lasts longer than a week. It may stop on its own or with treatment.  Long-lasting persistent atrial fibrillation. This type lasts longer than 12 months.  Permanent atrial fibrillation. This type does not go away. Talk with your health care provider to learn about the type of atrial fibrillation that you have. CAUSES This condition is caused by some heart-related conditions or procedures, including:  A heart attack.  Coronary artery disease.  Heart failure.  Heart valve conditions.  High blood pressure.  Inflammation of the sac that surrounds the heart (pericarditis).  Heart surgery.  Certain heart rhythm disorders, such as Wolf-Parkinson-White syndrome. Other causes include:  Pneumonia.  Obstructive sleep apnea.  Blockage of an artery in the lungs (pulmonary embolism, or PE).  Lung cancer.  Chronic lung disease.  Thyroid problems, especially if the thyroid is overactive (hyperthyroidism).  Caffeine.  Excessive alcohol use or illegal drug use.  Use of some medicines,  including certain decongestants and diet pills. Sometimes, the cause cannot be found. RISK FACTORS This condition is more likely to develop in:  People who are older in age.  People who smoke.  People who have diabetes mellitus.  People who are overweight (obese).  Athletes who exercise vigorously. SYMPTOMS Symptoms of this condition include:  A feeling that your heart is beating rapidly or irregularly.  A feeling of discomfort or pain in your chest.  Shortness of breath.  Sudden light-headedness or weakness.  Getting tired easily during exercise. In some cases, there are no symptoms. DIAGNOSIS Your health care provider may be able to detect atrial fibrillation when taking your pulse. If detected, this condition may be diagnosed with:  An electrocardiogram (ECG).  A Holter monitor test that records your heartbeat patterns over a 24-hour period.  Transthoracic echocardiogram (TTE) to evaluate how blood flows through your heart.  Transesophageal echocardiogram (TEE) to view more detailed images of your heart.  A stress test.  Imaging tests, such as a CT scan or chest X-ray.  Blood tests. TREATMENT The main goals of treatment are to prevent blood clots from forming and to keep your heart beating at a normal rate and rhythm. The type of treatment that you receive depends on many factors, such as your underlying medical conditions and how you feel when you are experiencing atrial fibrillation. This condition may be treated with:  Medicine to slow down the heart rate, bring the heart's rhythm back to normal, or prevent clots from forming.  Electrical cardioversion. This is a procedure that resets your heart's rhythm by delivering a controlled, low-energy shock to the heart through your skin.  Different types of ablation, such as catheter ablation,  catheter ablation with pacemaker, or surgical ablation. These procedures destroy the heart tissues that send abnormal signals.  When the pacemaker is used, it is placed under your skin to help your heart beat in a regular rhythm. HOME CARE INSTRUCTIONS  Take over-the counter and prescription medicines only as told by your health care provider.  If your health care provider prescribed a blood-thinning medicine (anticoagulant), take it exactly as told. Taking too much blood-thinning medicine can cause bleeding. If you do not take enough blood-thinning medicine, you will not have the protection that you need against stroke and other problems.  Do not use tobacco products, including cigarettes, chewing tobacco, and e-cigarettes. If you need help quitting, ask your health care provider.  If you have obstructive sleep apnea, manage your condition as told by your health care provider.  Do not drink alcohol.  Do not drink beverages that contain caffeine, such as coffee, soda, and tea.  Maintain a healthy weight. Do not use diet pills unless your health care provider approves. Diet pills may make heart problems worse.  Follow diet instructions as told by your health care provider.  Exercise regularly as told by your health care provider.  Keep all follow-up visits as told by your health care provider. This is important. PREVENTION  Avoid drinking beverages that contain caffeine or alcohol.  Avoid certain medicines, especially medicines that are used for breathing problems.  Avoid certain herbs and herbal medicines, such as those that contain ephedra or ginseng.  Do not use illegal drugs, such as cocaine and amphetamines.  Do not smoke.  Manage your high blood pressure. SEEK MEDICAL CARE IF:  You notice a change in the rate, rhythm, or strength of your heartbeat.  You are taking an anticoagulant and you notice increased bruising.  You tire more easily when you exercise or exert yourself. SEEK IMMEDIATE MEDICAL CARE IF:  You have chest pain, abdominal pain, sweating, or weakness.  You feel nauseous.  You  notice blood in your vomit, bowel movement, or urine.  You have shortness of breath.  You suddenly have swollen feet and ankles.  You feel dizzy.  You have sudden weakness or numbness of the face, arm, or leg, especially on one side of the body.  You have trouble speaking, trouble understanding, or both (aphasia).  Your face or your eyelid droops on one side. These symptoms may represent a serious problem that is an emergency. Do not wait to see if the symptoms will go away. Get medical help right away. Call your local emergency services (911 in the [place] Do not drive yourself to the hospital.   This information is not intended to replace advice given to you by your health care provider. Make sure you discuss any questions you have with your health care provider.   Document Released: 12/23/2004 Document Revised: 09/13/2014 Document Reviewed: 04/19/2014 Elsevier Interactive Patient Education 2016 [employer] Pulmonary Edema Pulmonary edema (PE) is a condition in which fluid collects in the lungs. This makes it hard to breathe. PE may be a result of the heart not pumping very well or a result of injury.  CAUSES   Coronary artery disease causes blockages in the arteries of the heart. This deprives the heart muscle of oxygen and weakens the muscle. A heart attack is a form of coronary artery disease.  High blood pressure causes the heart muscle to work harder than usual. Over time, the heart muscle may get stiff, and it starts to work less efficiently.  It may also fatigue and weaken.  Viral infection of the heart (myocarditis) may weaken the heart muscle.  Metabolic conditions such as thyroid disease, excessive alcohol use, certain vitamin deficiencies, or diabetes may also weaken the heart muscle.  Leaky or stiff heart valves may impair normal heart function.  Lung disease may strain the heart muscle.  Excessive demands on the heart such as too much salt or fluid  intake.  Failure to take prescribed medicines.  Lung injury from heat or toxins, such as poisonous gas.  Infection in the lungs or other parts of the body.  Fluid overload caused by kidney failure or medicines. SYMPTOMS   Shortness of breath at rest or with exertion.  Grunting, wheezing, or gurgling while breathing.  Feeling like you cannot get enough air.  Breaths are shallow and fast.  A lot of coughing with frothy or bloody mucus.  Skin may become cool, damp, and turn a pale or bluish color. DIAGNOSIS  Initial diagnosis may be based on your history, symptoms, and a physical examination. Additional tests for PE may include:  Electrocardiography.  Chest X-ray.  Blood tests.  Stress test.  Ultrasound evaluation of the heart (echocardiography).  Evaluation by a heart doctor (cardiologist).  Test of the heart arteries to look for blockages (angiography).  Check of blood oxygen. TREATMENT  Treatment of PE will depend on the underlying cause and will focus first on relieving the symptoms.   Extra oxygen to make breathing easier and assist with removing mucus. This may include breathing treatments or a tube into the lungs and a breathing machine.  Medicine to help the body get rid of extra water, usually through an IV tube.  Medicine to help the heart pump better.  If poor heart function is the cause, treatment may include:  Procedures to open blocked arteries, repair damaged heart valves, or remove some of the damaged heart muscle.  A pacemaker to help the heart pump with less effort. HOME CARE INSTRUCTIONS   Your health care provider will help you determine what type of exercise program may be helpful. It is important to maintain strength and increase it if possible. Pace your activities to avoid shortness of breath or chest pain. Rest for at least 1 hour before and after meals. Cardiac rehabilitation programs are available in some locations.  Eat a  heart-healthy diet low in salt, saturated fat, and cholesterol. Ask for help with choices.  Make a list of every medicine, vitamin, or herbal supplement you are taking. Keep the list with you at all times. Show it to your health care provider at every visit and before starting a new medicine. Keep the list up to date.  Ask your health care provider or pharmacist to help you write a plan or schedule so that you know things about each medicine such as:  Why you are taking it.  The possible side effects.  The best time of day to take it.  Foods to take with it or avoid.  When to stop taking it.  Record your hospital or clinic weight. When you get home, compare it to your scale and record your weight. Then, weigh yourself first thing in the morning daily, and record the weights. You should weigh yourself every morning after you urinate and before you eat breakfast. Wear the same amount of clothing each time you weigh yourself. Provide your health care provider with your weight record. Daily weights are important in the early recognition of excess fluid. Tell  your health care provider right away if you have gained 03 lb/1.4 kg in 1 day, 05 lb/2.3 kg in a week, or as directed by your health care provider. Your medicines may need to be adjusted.  Blood pressure monitoring should be done as often as directed. You can get a home blood pressure cuff at your drugstore. Record these values and bring them with you for your clinic visits. Notify your health care provider if you become dizzy or light-headed when standing up.  If you are currently a smoker, it is time to quit. Nicotine makes your heart work harder and is one of the leading causes of cardiac deaths. Do not use nicotine gum or patches before talking to your doctor.  Make a follow-up appointment with your health care provider as directed.  Ask your health care provider for a copy of your latest heart tracing (ECG) and keep a copy with you at  all times. SEEK IMMEDIATE MEDICAL CARE IF:   You have severe chest pain, especially if the pain is crushing or pressure-like and spreads to the arms, back, neck, or jaw. THIS IS AN EMERGENCY. Do not wait to see if the pain will go away. Call for local emergency medical help. Do not drive yourself to the hospital.  You have sweating, feel sick to your stomach (nauseous), or are experiencing shortness of breath.  Your weight increases by 03 lb/1.4 kg in 1 day or 05 lb/2.3 kg in a week.  You notice increasing shortness of breath that is unusual for you. This may happen during rest, sleep, or with activity.  You develop chest pain (angina) or pain that is unusual for you.  You notice more swelling in your hands, feet, ankles, or abdomen.  You notice lasting (persistent) dizziness, blurred vision, headache, or unsteadiness.  You begin to cough up bloody mucus (sputum).  You are unable to sleep because it is hard to breathe.  You begin to feel a "jumping" or "fluttering" sensation (palpitations) in the chest that is unusual for you. MAKE SURE YOU:  Understand these instructions.  Will watch your condition.  Will get help right away if you are not doing well or get worse.   This information is not intended to replace advice given to you by your health care provider. Make sure you discuss any questions you have with your health care provider.   Document Released: 03/15/2002 Document Revised: 12/28/2012 Document Reviewed: 08/30/2012 Elsevier Interactive Patient Education 2016 [employer]. -------------------------------------------------------------------------------------------------------------  Information on my medicine - ELIQUIS (apixaban)  This medication education was reviewed with me or my healthcare representative as part of my discharge preparation.  The pharmacist that spoke with me during my hospital stay was:  Arty Baumgartner, [facility]  Why was Eliquis prescribed for  you? Eliquis was prescribed for you to reduce the risk of a blood clot forming that can cause a stroke if you have a medical condition called atrial fibrillation (a type of irregular heartbeat).  What do You need to know about Eliquis ? Take your Eliquis TWICE DAILY - one tablet in the morning and one tablet in the evening with or without food. If you have difficulty swallowing the tablet whole please discuss with your pharmacist how to take the medication safely.  Take Eliquis exactly as prescribed by your doctor and DO NOT stop taking Eliquis without talking to the doctor who prescribed the medication.  Stopping may increase your risk of developing a stroke.  Refill your prescription before  you run out.  After discharge, you should have regular check-up appointments with your healthcare provider that is prescribing your Eliquis.  In the future your dose may need to be changed if your kidney function or weight changes by a significant amount or as you get older.  What do you do if you miss a dose? If you miss a dose, take it as soon as you remember on the same day and resume taking twice daily.  Do not take more than one dose of ELIQUIS at the same time to make up a missed dose.  Important Safety Information A possible side effect of Eliquis is bleeding. You should call your healthcare provider right away if you experience any of the following: ? Bleeding from an injury or your nose that does not stop. ? Unusual colored urine (red or dark brown) or unusual colored stools (red or black). ? Unusual bruising for unknown reasons. ? A serious fall or if you hit your head (even if there is no bleeding).  Some medicines may interact with Eliquis and might increase your risk of bleeding or clotting while on Eliquis. To help avoid this, consult your healthcare provider or pharmacist prior to using any new prescription or non-prescription medications, including herbals, vitamins, non-steroidal  anti-inflammatory drugs (NSAIDs) and supplements.  This website has more information on Eliquis (apixaban): http://www.eliquis.com/eliquis/home  You have an appointment set up with the [facility].  Multiple studies have shown that being followed by a dedicated atrial fibrillation clinic in addition to the standard care you receive from your other physicians improves health. We believe that enrollment in the atrial fibrillation clinic will allow [place] to better care for you.   The phone number to the [facility] is [phone]. The clinic is staffed Monday through Friday from 8:30am to 5pm.  Parking Directions: The clinic is located in the Heart and Vascular Building connected to [facility]. 1)From [address] turn on to [address] and go to the 3rd entrance  (Heart and Vascular entrance) on the right. 2)Look to the right for Heart &Vascular Parking Garage. 3)A code for the entrance is required please call the clinic to receive this.   4)Take the elevators to the 1st floor. Registration is in the room with the glass walls at the end of the hallway.  If you have any trouble parking or locating the clinic, please dont hesitate to call [phone].

## 2014-11-21 NOTE — Progress Notes (Signed)
    Discussed with Donna [name] (EP) to see. Per Dr. Caryl Comes note, dofetilide.   Candee Furbish, MD

## 2014-11-21 NOTE — Progress Notes (Signed)
Utilization review completed. Eitan Doubleday, RN, BSN. 

## 2014-11-21 NOTE — Progress Notes (Signed)
EKG for pre-dose Tikosyn completed. QTc within normal limits.

## 2014-11-21 NOTE — Progress Notes (Signed)
SUBJECTIVE: The patient is stable today.  At this time, she denies chest pain, shortness of breath, or any new concerns.  CURRENT MEDICATIONS: . apixaban  5 mg Oral BID  . carvedilol  12.5 mg Oral BID WC  . diltiazem  180 mg Oral Daily  . sodium chloride  3 mL Intravenous Q12H      OBJECTIVE: Physical Exam: Filed Vitals:   11/21/14 0017 11/21/14 0430 11/21/14 0548 11/21/14 0753  BP: 117/62  112/58 121/89  Pulse: 65  87 100  Temp: 97.5 F (36.4 C)  97.4 F (36.3 C) 97.9 F (36.6 C)  TempSrc: Oral  Oral Oral  Resp: 16  16 18   Height:      Weight:  100 lb 1.6 oz (45.405 kg)    SpO2: 100%  99% 98%    Intake/Output Summary (Last 24 hours) at 11/21/14 1154 Last data filed at 11/21/14 0841  Gross per 24 hour  Intake    840 ml  Output    675 ml  Net    165 ml    Telemetry reveals atrial fibrillation with controlled ventricular response, occasional pauses of up to 4 seconds (asymptomatic)  GEN- The patient is well appearing, alert and oriented x 3 today.   Head- normocephalic, atraumatic Eyes-  Sclera clear, conjunctiva pink Ears- hearing intact Oropharynx- clear Neck- supple  Lungs- Clear to ausculation bilaterally, normal work of breathing Heart- Irregular rate and rhythm  GI- soft, NT, ND, + BS Extremities- no clubbing, cyanosis, or edema Skin- no rash or lesion Psych- euthymic mood, full affect Neuro- strength and sensation are intact  LABS: Basic Metabolic Panel:  Recent Labs  11/20/14 0428 11/20/14 1715 11/21/14 0452  NA 139 141 139  K 3.1* 3.9 4.2  CL 106 104 109  CO2 25 27 21*  GLUCOSE 119* 144* 123*  BUN 19 21* 21*  CREATININE 1.10* 1.22* 1.03*  CALCIUM 9.5 9.7 9.6  MG 1.7  --   --     CBC:  Recent Labs  11/18/14 2233 11/19/14 0613  WBC 4.2 4.6  HGB 13.6 14.8  HCT 40.2 42.5  MCV 95.9 94.4  PLT 160 146*   Cardiac Enzymes:  Recent Labs  11/18/14 2233  TROPONINI <0.03   Thyroid Function Tests:  Recent Labs  11/19/14 1250    TSH 0.443    RADIOLOGY: Dg Chest 2 View 11/18/2014  CLINICAL DATA:  Acute onset of chest discomfort.  Initial encounter. EXAM: CHEST  2 VIEW COMPARISON:  Chest radiograph performed 11/13/2014 FINDINGS: The lungs are well-aerated. Small bilateral pleural effusions are noted. Bibasilar airspace opacities raise question for mild interstitial edema. There is no evidence of pneumothorax. The heart is mildly enlarged. No acute osseous abnormalities are seen. IMPRESSION: Mild cardiomegaly. Small bilateral pleural effusions noted. Bibasilar airspace opacities raise question for mild interstitial edema. Electronically Signed   By: Garald Balding M.D.   On: 11/18/2014 22:56     ASSESSMENT AND PLAN:  Principal Problem:   Acute on chronic diastolic heart failure (HCC) Active Problems:   Chronic atrial fibrillation (HCC)   CKD (chronic kidney disease), stage III   Essential hypertension   Sinus pause   Hypokalemia    1.  Persistent atrial fibrillation Plan to start Tikosyn as per Dr Olin Pia discussion yesterday Pt reports compliance with Eliquis without interruption for last 4 weeks CrCl 30 - Shemia Bevel start 140mcg twice daily Mg low, Karema Tocci start Mag-Ox 400mg  bid  QTc stable  2.  Diastolic  heart failure Euvolemic on exam No changes today  3.  Pauses May need to decrease Coreg dose, Dairon Procter follow for now and re-evaluate once in [place], NP 11/21/2014 1:51 PM  I have seen and examined this patient with Chanetta Marshall.  Agree with above, note added to reflect my findings.  On exam, irregular rhythm, no murmurs, lungs clear.    Patient in atrial fibrillation.  Started tikosyn today, 125 mcg as has low creatinine clearance.  Jessly Lebeck check ECG 2 hours post dose.  Compliant with anticoagulation.  Continue Eliquis.  Stephana Morell M. Marlaysia Lenig MD 11/21/2014 3:49 PM

## 2014-11-21 NOTE — Progress Notes (Signed)
QTc 3 hours after first dose is within normal limits.

## 2014-11-21 NOTE — Progress Notes (Signed)
Pharmacy Review for Dofetilide (Tikosyn) Initiation  Admit Complaint: 79 y.o. female admitted 11/18/2014 with CHF exacerbation. Dofetilide to begin today for persistent atrial fibrillation.  Assessment:  Patient Exclusion Criteria: If any screening criteria checked as "Yes", then  patient  should NOT receive dofetilide until criteria item is corrected. If "Yes" please indicate correction plan.  YES  NO Patient  Exclusion Criteria Correction Plan  []  [x]  Baseline QTc interval is greater than or equal to 440 msec. IF above YES box checked dofetilide contraindicated unless patient has ICD; then may proceed if QTc 500-550 msec or with known ventricular conduction abnormalities may proceed with QTc 550-600 msec. QTc =     [x]  []  Magnesium level is less than 1.8 mEq/l : Last magnesium:  Lab Results  Component Value Date   MG 1.7 11/20/2014      Mag Oxide 400 mg PO BID begun.   []  [x]  Potassium level is less than 4 mEq/l : Last potassium:  Lab Results  Component Value Date   K 4.2 11/21/2014         []  [x]  Patient is known or suspected to have a digoxin level greater than 2 ng/ml: No results found for: DIGOXIN    []  [x]  Creatinine clearance less than 20 ml/min (calculated using Cockcroft-Gault, actual body weight and serum creatinine): Estimated Creatinine Clearance: 32.8 mL/min (by C-G formula based on Cr of 1.03).    []  [x]  Patient has received drugs known to prolong the QT intervals within the last 48 hours (phenothiazines, tricyclics or tetracyclic antidepressants, erythromycin, H-1 antihistamines, cisapride, fluoroquinolones, azithromycin). Drugs not listed above may have an, as yet, undetected potential to prolong the QT interval, updated information on QT prolonging agents is available at this website:QT prolonging agents   []  [x]  Patient received a dose of hydrochlorothiazide (Oretic) alone or in any combination including triamterene (Dyazide, Maxzide) in the last 48 hours.   []   [x]  Patient received a medication known to increase dofetilide plasma concentrations prior to initial dofetilide dose:  . Trimethoprim (Primsol, Proloprim) in the last 36 hours . Verapamil (Calan, Verelan) in the last 36 hours or a sustained release dose in the last 72 hours . Megestrol (Megace) in the last 5 days  . Cimetidine (Tagamet) in the last 6 hours . Ketoconazole (Nizoral) in the last 24 hours . Itraconazole (Sporanox) in the last 48 hours  . Prochlorperazine (Compazine) in the last 36 hours    []  [x]  Patient is known to have a history of torsades de pointes; congenital or acquired long QT syndromes.   []  [x]  Patient has received a Class 1 antiarrhythmic with less than 2 half-lives since last dose. (Disopyramide, Quinidine, Procainamide, Lidocaine, Mexiletine, Flecainide, Propafenone)   []  [x]  Patient has received amiodarone therapy in the past 3 months or amiodarone level is greater than 0.3 ng/ml.    Patient has been appropriately anticoagulated with Apixaban.  Ordering provider was confirmed at [email or web address] if they are not listed on the [place] Prescribers list.  Goal of Therapy: Follow renal function, electrolytes, potential drug interactions, and dose adjustment. Provide education and 1 week supply at discharge.  Plan:  [x]   Physician selected initial dose within range recommended for patients level of renal function - will monitor for response.  []   Physician selected initial dose outside of range recommended for patients level of renal function - will discuss if the dose should be altered at this time.   Select One Calculated CrCl  Dose q12h  []  >  60 ml/min 500 mcg  []  40-60 ml/min 250 mcg  [x]  20-40 ml/min 125 mcg   2. Follow up QTc after the first 5 doses, renal function, electrolytes (K & Mg) daily x 3     days, dose adjustment, success of initiation and facilitate 1 week discharge supply as     clinically indicated.  3. Initiate Tikosyn education  video (Call [phone] and ask for video # [phone]).   Arty Baumgartner, [place] Pager: [phone]  4:15 PM 11/21/2014

## 2014-11-21 NOTE — Consult Note (Signed)
   [facility] CM Inpatient Consult   11/21/2014  LORENZO PEREYRA 01/28/1935 [id number] Patient evaluated for community based chronic disease management services with [facility] as a benefit of patient's [facility]. Met with patient, husband, [name], and daughter, [name] at bedside to explain [facility] services. Patient endorses Dr. Josetta Huddle as her primary care provider.  Consent form obtained with family in agreement.  Patient will receive post hospital discharge call and will be evaluated for monthly home visits for assessments and disease process education.  Left contact information and [facility] literature at bedside. Made Inpatient Case Manager aware that [facility] following. Of note, [facility] Care Management services does not replace or interfere with any services that are arranged by inpatient case management or social work.  For additional questions or referrals please contact:  Natividad Brood, RN BSN [facility] Liaison  [phone] business mobile phone

## 2014-11-22 ENCOUNTER — Other Ambulatory Visit: Payer: Self-pay

## 2014-11-22 DIAGNOSIS — N183 Chronic kidney disease, stage 3 (moderate): Secondary | ICD-10-CM

## 2014-11-22 LAB — BASIC METABOLIC PANEL
Anion gap: 8 (ref 5–15)
BUN: 21 mg/dL — AB (ref 6–20)
CHLORIDE: 104 mmol/L (ref 101–111)
CO2: 27 mmol/L (ref 22–32)
Calcium: 9.7 mg/dL (ref 8.9–10.3)
Creatinine, Ser: 0.98 mg/dL (ref 0.44–1.00)
GFR calc non Af Amer: 54 mL/min — ABNORMAL LOW (ref >60)
Glucose, Bld: 103 mg/dL — ABNORMAL HIGH (ref 65–99)
POTASSIUM: 3.9 mmol/L (ref 3.5–5.1)
SODIUM: 139 mmol/L (ref 135–145)

## 2014-11-22 LAB — MAGNESIUM: Magnesium: 2.2 mg/dL (ref 1.7–2.4)

## 2014-11-22 MED ORDER — DILTIAZEM HCL ER COATED BEADS 120 MG PO CP24
120.0000 mg | ORAL_CAPSULE | Freq: Every day | ORAL | Status: DC
  Administered 2014-11-22 – 2014-11-24 (×3): 120 mg via ORAL
  Filled 2014-11-22 (×3): qty 1

## 2014-11-22 MED ORDER — POTASSIUM CHLORIDE CRYS ER 20 MEQ PO TBCR
20.0000 meq | EXTENDED_RELEASE_TABLET | Freq: Once | ORAL | Status: AC
  Administered 2014-11-22: 20 meq via ORAL
  Filled 2014-11-22: qty 1

## 2014-11-22 MED ORDER — HALOPERIDOL LACTATE 5 MG/ML IJ SOLN: 2.0000 mg | Freq: Four times a day (QID) | INTRAMUSCULAR | Status: DC | PRN

## 2014-11-22 MED ORDER — CARVEDILOL 12.5 MG PO TABS: 12.5000 mg | ORAL_TABLET | Freq: Two times a day (BID) | ORAL | Status: DC

## 2014-11-22 MED ORDER — CARVEDILOL 6.25 MG PO TABS
6.2500 mg | ORAL_TABLET | Freq: Two times a day (BID) | ORAL | Status: DC
  Administered 2014-11-22 – 2014-11-24 (×5): 6.25 mg via ORAL
  Filled 2014-11-22 (×5): qty 1

## 2014-11-22 NOTE — Progress Notes (Addendum)
As per pre-procedural orders (do not admin ACE inhibitors, diuretics), the following medications were not given with the AM meds: Lisinopril Aldactone Lasix

## 2014-11-22 NOTE — Progress Notes (Signed)
SUBJECTIVE: The patient is stable today.  At this time, she denies chest pain, shortness of breath, or any new concerns.  There is a sitter at bedside with patient confusion noted.  CURRENT MEDICATIONS: . apixaban  5 mg Oral BID  . carvedilol  12.5 mg Oral BID WC  . diltiazem  180 mg Oral Daily  . dofetilide  125 mcg Oral BID  . magnesium oxide  400 mg Oral BID  . potassium chloride  20 mEq Oral Once      OBJECTIVE: Physical Exam: Filed Vitals:   11/21/14 1657 11/21/14 2033 11/22/14 0554 11/22/14 0640  BP: 120/87 131/85 128/87   Pulse: 98 101 98   Temp:  97.9 F (36.6 C) 97.7 F (36.5 C)   TempSrc:  Oral Oral   Resp: 18 18 20    Height:      Weight:    103 lb 8 oz (46.947 kg)  SpO2: 97% 100% 100%     Intake/Output Summary (Last 24 hours) at 11/22/14 0759 Last data filed at 11/21/14 2240  Gross per 24 hour  Intake   1440 ml  Output    100 ml  Net   1340 ml    Telemetry reveals atrial fibrillation with controlled ventricular response, occasional pauses of up to 4 seconds (asymptomatic)  GEN- The patient is well appearing, alert initially oriented only to self, though once re-oriented, was accurate in repeat questions.   Head- normocephalic, atraumatic Eyes-  Sclera clear, conjunctiva pink Ears- hearing intact Neck- supple  Lungs- Clear to ausculation bilaterally, normal work of breathing Heart- Irregular rate and rhythm  GI- soft, NT, ND, + BS Extremities- no clubbing, cyanosis, or edema Skin- no rash or lesion Psych- euthymic mood, full affect Neuro- strength and sensation are intact  LABS: Basic Metabolic Panel:  Recent Labs  11/20/14 0428  11/21/14 0452 11/22/14 0231  NA 139  < > 139 139  K 3.1*  < > 4.2 3.9  CL 106  < > 109 104  CO2 25  < > 21* 27  GLUCOSE 119*  < > 123* 103*  BUN 19  < > 21* 21*  CREATININE 1.10*  < > 1.03* 0.98  CALCIUM 9.5  < > 9.6 9.7  MG 1.7  --   --  2.2  < > = values in this interval not displayed.  CBC: No results  for input(s): WBC, NEUTROABS, HGB, HCT, MCV, PLT in the last 72 hours. Cardiac Enzymes: No results for input(s): CKTOTAL, CKMB, CKMBINDEX, TROPONINI in the last 72 hours. Thyroid Function Tests:  Recent Labs  11/19/14 1250  TSH 0.443    RADIOLOGY: Dg Chest 2 View 11/18/2014  CLINICAL DATA:  Acute onset of chest discomfort.  Initial encounter. EXAM: CHEST  2 VIEW COMPARISON:  Chest radiograph performed 11/13/2014 FINDINGS: The lungs are well-aerated. Small bilateral pleural effusions are noted. Bibasilar airspace opacities raise question for mild interstitial edema. There is no evidence of pneumothorax. The heart is mildly enlarged. No acute osseous abnormalities are seen. IMPRESSION: Mild cardiomegaly. Small bilateral pleural effusions noted. Bibasilar airspace opacities raise question for mild interstitial edema. Electronically Signed   By: Garald Balding M.D.   On: 11/18/2014 22:56     ASSESSMENT AND PLAN:  Principal Problem:   Acute on chronic diastolic heart failure (HCC) Active Problems:   Chronic atrial fibrillation (HCC)   CKD (chronic kidney disease), stage III   Essential hypertension   Sinus pause   Hypokalemia  1.  Persistent atrial fibrillation--atrial flutter Tikosyn initiated, she has received 2 doses. EKG reviewed with Dr. Caryl Comes, QTc 406, remains in Aflutter Pt (and husband) had reported compliance with Eliquis without interruption for last 4 weeks Renal function stable Mg 2.2, K+ 3.9 getting replacement    2.  Diastolic heart failure Euvolemic on exam No changes today  3. Confusion delerium, d/w medicine service, she will address.  4. Hypokalemia we'll replete  5.Bradycardia atrial flutter; we'll decrease AV node blocking agents as below  11/22/2014 7:59 AM  She is less confused than she was a few hours ago.  We'll continue dofetilide. If her confusion persists, I would defer cardioversion until as an outpatient. She still needs another 48 hours of  in-hospital initiation of dofetilide.  No evidence of volume overload today.  She's having pauses; we will decrease carvedilol and diltiazem

## 2014-11-22 NOTE — Care Management Important Message (Signed)
Important Message  Patient Details  Name: Donna [name] MRN: [id number] Date of Birth: 08-Nov-1935   Medicare Important Message Given:  Yes    Vasili Fok P [place] 11/22/2014, 12:39 PM

## 2014-11-22 NOTE — Progress Notes (Signed)
Benefit check for Tikosyn  Donna [name] CMA            Per automated system at [facility]:   Tier 1 - $6  Tier 2 - $17  Tier 3 - $47  Tier 4 - $97  Tier 5 - 26% of total cost   Tikosyn is a Tier 4, covered, no auth required. Based on patient current stage in coverage estimated co-pay at [facility] preferred retail pharmacy would be $132.32 for 30 day retail, 90 day mail order would be $316.[place]

## 2014-11-22 NOTE — Progress Notes (Signed)
Tikosyn administered at 0940.  EKG performed 3 hours later per protocol at 1248.  Results are listed in Epic and shadow chart.  QT/QTc = 312/406.

## 2014-11-22 NOTE — Progress Notes (Signed)
After EKG review, and per Tommye Standard, PA-C, ok to admin Tikosyn this AM.

## 2014-11-22 NOTE — Progress Notes (Addendum)
[facility] Progress Note   Donna [name]  [id number]  DOB: November 06, 1935  DOA: 11/18/2014 PCP: Henrine Screws, MD  Brief narrative: Donna [name] is a 79 y.o. female with atrial fibrillation, diastolic heart failure, CK D2 presents to the hospital for shortness of breath and swelling of her ankles. She is admitted for acute congestive heart failure.   Subjective: No complaints of shortness of breath cough chest pain.  Assessment/Plan: Principal Problem:   Acute on chronic diastolic heart failure -Given IV furosemide which was discontinued by cardiology as it was felt that she was now euvolemic  Active Problems: Paroxysmal A. Fib/flutter - CHA2DS2-VASc Score 4 -Having multiple pauses - EP is following and has started the patient on Tikosyn -Continue Coreg, diltiazem and eliquis  Hospital acquired delirium -Continue sitter at bedside-currently not having any behavioral issues but will order Haldol when necessary if she does  Hypokalemia -Replaced    Hypertension -Stable    CKD (chronic kidney disease), stage III -At baseline   Code Status:     Code Status Orders        Start     Ordered   11/21/14 1219  Full code   Continuous     11/21/14 1220     Family Communication:  Disposition Plan: Home in 2 days DVT prophylaxis: Eliquis  Consultants: Cardiology, EP  Antibiotics: Anti-infectives    None      Objective: Filed Weights   11/20/14 0702 11/21/14 0430 11/22/14 0640  Weight: 47.991 kg (105 lb 12.8 oz) 45.405 kg (100 lb 1.6 oz) 46.947 kg (103 lb 8 oz)    Intake/Output Summary (Last 24 hours) at 11/22/14 1534 Last data filed at 11/22/14 1437  Gross per 24 hour  Intake   1375 ml  Output    100 ml  Net   1275 ml     Vitals Filed Vitals:   11/22/14 0554 11/22/14 0640 11/22/14 0949 11/22/14 1225  BP: 128/87  112/70 130/91  Pulse: 98  102 101  Temp: 97.7 F (36.5 C)   97.9 F (36.6 C)  TempSrc: Oral   Oral  Resp: 20   20   Height:      Weight:  46.947 kg (103 lb 8 oz)    SpO2: 100%   100%    Exam:  General:  Pt is alert, not in acute distress  HEENT: No icterus, No thrush, oral mucosa moist  Cardiovascular: regular rate and rhythm, S1/S2 No murmur  Respiratory: clear to auscultation bilaterally   Abdomen: Soft, +Bowel sounds, non tender, non distended, no guarding  MSK: No LE edema, cyanosis or clubbing  Data Reviewed: Basic Metabolic Panel:  Recent Labs Lab 11/18/14 2233 11/20/14 0428 11/20/14 1715 11/21/14 0452 11/22/14 0231  NA 138 139 141 139 139  K 4.7 3.1* 3.9 4.2 3.9  CL 110 106 104 109 104  CO2 22 25 27  21* 27  GLUCOSE 146* 119* 144* 123* 103*  BUN 24* 19 21* 21* 21*  CREATININE 1.07* 1.10* 1.22* 1.03* 0.98  CALCIUM 9.7 9.5 9.7 9.6 9.7  MG  --  1.7  --   --  2.2   Liver Function Tests: No results for input(s): AST, ALT, ALKPHOS, BILITOT, PROT, ALBUMIN in the last 168 hours. No results for input(s): LIPASE, AMYLASE in the last 168 hours. No results for input(s): AMMONIA in the last 168 hours. CBC:  Recent Labs Lab 11/18/14 2233 11/19/14 0613  WBC 4.2 4.6  HGB 13.6 14.8  HCT  40.2 42.5  MCV 95.9 94.4  PLT 160 146*   Cardiac Enzymes:  Recent Labs Lab 11/18/14 2233  TROPONINI <0.03   BNP (last 3 results)  Recent Labs  11/06/14 1006 11/13/14 1005 11/19/14 0036  BNP 927.0* 820.6* 1196.4*    ProBNP (last 3 results) No results for input(s): PROBNP in the last 8760 hours.  CBG: No results for input(s): GLUCAP in the last 168 hours.  No results found for this or any previous visit (from the past 240 hour(s)).   Studies: No results found.  Scheduled Meds:  Scheduled Meds: . apixaban  5 mg Oral BID  . carvedilol  6.25 mg Oral BID WC  . diltiazem  120 mg Oral Daily  . dofetilide  125 mcg Oral BID  . magnesium oxide  400 mg Oral BID   Continuous Infusions:   Time spent on care of this patient: 35 min   [place], MD 11/22/2014, 3:34 PM   LOS: 3 days   [facility] Office  [phone] Pager - Text Page per [email or web address] If 7PM-7AM, please contact night-coverage [email or web address]

## 2014-11-22 NOTE — Progress Notes (Signed)
Pt very confused during this shift after husband has left, apparently continuing since Sunday. Re-orientation unsuccessful. Pt has pulled telemetry off multiple times. Pt has also pulled out IV. RN has informed charge RN that this pt will need a sitter in the AM and especially during the following night shifts while pt's family is not here. Will continue to monitor.

## 2014-11-23 ENCOUNTER — Inpatient Hospital Stay (HOSPITAL_COMMUNITY): Payer: Commercial Managed Care - HMO | Admitting: Anesthesiology

## 2014-11-23 ENCOUNTER — Other Ambulatory Visit: Payer: Self-pay

## 2014-11-23 ENCOUNTER — Encounter (HOSPITAL_COMMUNITY)
Admission: EM | Disposition: A | Discharge status: Home / Self Care | Payer: Commercial Managed Care - HMO | Source: Home / Self Care | Attending: Internal Medicine

## 2014-11-23 ENCOUNTER — Encounter (HOSPITAL_COMMUNITY): Payer: Self-pay | Admitting: Anesthesiology

## 2014-11-23 DIAGNOSIS — I4892 Unspecified atrial flutter: Secondary | ICD-10-CM

## 2014-11-23 HISTORY — PX: CARDIOVERSION: SHX1299

## 2014-11-23 LAB — BASIC METABOLIC PANEL
ANION GAP: 8 (ref 5–15)
Anion gap: 6 (ref 5–15)
BUN: 14 mg/dL (ref 6–20)
BUN: 13 mg/dL (ref 6–20)
CALCIUM: 9.7 mg/dL (ref 8.9–10.3)
CALCIUM: 9.7 mg/dL (ref 8.9–10.3)
CO2: 26 mmol/L (ref 22–32)
CO2: 24 mmol/L (ref 22–32)
CREATININE: 0.84 mg/dL (ref 0.44–1.00)
Chloride: 109 mmol/L (ref 101–111)
Chloride: 106 mmol/L (ref 101–111)
Creatinine, Ser: 0.75 mg/dL (ref 0.44–1.00)
GFR calc Af Amer: >60 mL/min (ref >60)
GFR calc Af Amer: >60 mL/min (ref >60)
GLUCOSE: 109 mg/dL — AB (ref 65–99)
GLUCOSE: 117 mg/dL — AB (ref 65–99)
POTASSIUM: 4.2 mmol/L (ref 3.5–5.1)
POTASSIUM: 4 mmol/L (ref 3.5–5.1)
SODIUM: 141 mmol/L (ref 135–145)
SODIUM: 138 mmol/L (ref 135–145)

## 2014-11-23 LAB — PROTIME-INR
INR: 1.54 — ABNORMAL HIGH (ref 0.00–1.49)
PROTHROMBIN TIME: 18.5 s — AB (ref 11.6–15.2)

## 2014-11-23 LAB — MAGNESIUM: MAGNESIUM: 2.3 mg/dL (ref 1.7–2.4)

## 2014-11-23 SURGERY — CARDIOVERSION
Anesthesia: General

## 2014-11-23 MED ORDER — SODIUM CHLORIDE 0.9 % IV SOLN
INTRAVENOUS | Status: DC
  Administered 2014-11-23: 50 mL/h via INTRAVENOUS

## 2014-11-23 MED ORDER — SODIUM CHLORIDE 0.9 % IV SOLN
INTRAVENOUS | Status: DC | PRN
  Administered 2014-11-23: 14:00:00 via INTRAVENOUS

## 2014-11-23 MED ORDER — DOFETILIDE 250 MCG PO CAPS
375.0000 ug | ORAL_CAPSULE | Freq: Two times a day (BID) | ORAL | Status: DC
  Administered 2014-11-23 – 2014-11-24 (×2): 375 ug via ORAL
  Filled 2014-11-23 (×4): qty 1

## 2014-11-23 MED ORDER — SODIUM CHLORIDE 0.9 % IV SOLN: 250.0000 mL | INTRAVENOUS | Status: DC

## 2014-11-23 MED ORDER — SODIUM CHLORIDE 0.9 % IJ SOLN
3.0000 mL | Freq: Two times a day (BID) | INTRAMUSCULAR | Status: DC
  Administered 2014-11-23 – 2014-11-24 (×2): 3 mL via INTRAVENOUS

## 2014-11-23 MED ORDER — SODIUM CHLORIDE 0.9 % IJ SOLN: 3.0000 mL | INTRAMUSCULAR | Status: DC | PRN

## 2014-11-23 MED ORDER — DOFETILIDE 500 MCG PO CAPS: 500.0000 ug | ORAL_CAPSULE | Freq: Two times a day (BID) | ORAL | Status: DC

## 2014-11-23 MED ORDER — PROPOFOL 10 MG/ML IV BOLUS
INTRAVENOUS | Status: DC | PRN
  Administered 2014-11-23: 40 mg via INTRAVENOUS

## 2014-11-23 MED ORDER — LIDOCAINE HCL (CARDIAC) 20 MG/ML IV SOLN
INTRAVENOUS | Status: DC | PRN
  Administered 2014-11-23: 50 mg via INTRAVENOUS

## 2014-11-23 NOTE — Transfer of Care (Signed)
Immediate Anesthesia Transfer of Care Note  Patient: Donna [name]  Procedure(s) Performed: Procedure(s): CARDIOVERSION (N/A)  Patient Location: PACU and Endoscopy Unit  Anesthesia Type:MAC  Level of Consciousness: awake, alert  and oriented  Airway & Oxygen Therapy: Patient Spontanous Breathing and Patient connected to nasal cannula oxygen  Post-op Assessment: Report given to RN and Post -op Vital signs reviewed and stable  Post vital signs: Reviewed and stable  Last Vitals:  Filed Vitals:   11/23/14 1255  BP: 170/110  Pulse: 104  Temp: 36.8 C  Resp: 14    Complications: No apparent anesthesia complications

## 2014-11-23 NOTE — CV Procedure (Signed)
Electrical Cardioversion Procedure Note ALLYCE ARCAND [id number] Sep 16, 1935  Procedure: Electrical Cardioversion Indications:  Atrial Flutter  Procedure Details Consent: Risks of procedure as well as the alternatives and risks of each were explained to the (patient/caregiver).  Consent for procedure obtained. Time Out: Verified patient identification, verified procedure, site/side was marked, verified correct patient position, special equipment/implants available, medications/allergies/relevent history reviewed, required imaging and test results available.  Performed  Patient placed on cardiac monitor, pulse oximetry, supplemental oxygen as necessary.  Sedation given: Propofol Pacer pads placed anterior and posterior chest.  Cardioverted 1 time(s).  Cardioverted at [place].  Successful.  Evaluation Findings: Post procedure EKG shows: NSR Complications: None Patient did tolerate procedure well.   Sharol Harness, MD 11/23/2014, 2:03 PM

## 2014-11-23 NOTE — Anesthesia Preprocedure Evaluation (Addendum)
Anesthesia Evaluation  Patient identified by MRN, date of birth, ID band Patient awake    Reviewed: Allergy & Precautions, NPO status , Patient's Chart, lab work & pertinent test results  Airway Mallampati: II  TM Distance: >3 FB Neck ROM: Full    Dental   Pulmonary neg pulmonary ROS,    breath sounds clear to auscultation       Cardiovascular hypertension, Pt. on medications and Pt. on home beta blockers + Peripheral Vascular Disease (renal art stent)  + dysrhythmias Atrial Fibrillation  Rhythm:Irregular Rate:Normal  08/2014 Echo: EF 55-60%, no rwma, mildly dil LA/RA, mod-sev TR, mild MR 8/16 Stess: normal perfusion, no ischemia   Neuro/Psych negative neurological ROS     GI/Hepatic Neg liver ROS,   Endo/Other  negative endocrine ROS  Renal/GU Renal disease     Musculoskeletal   Abdominal   Peds  Hematology  (+) Blood dyscrasia (eliquis), ,   Anesthesia Other Findings   Reproductive/Obstetrics                            Anesthesia Physical  Anesthesia Plan  ASA: III  Anesthesia Plan: General   Post-op Pain Management:    Induction: Intravenous  Airway Management Planned: Mask  Additional Equipment:   Intra-op Plan:   Post-operative Plan:   Informed Consent: I have reviewed the patients History and Physical, chart, labs and discussed the procedure including the risks, benefits and alternatives for the proposed anesthesia with the patient or authorized representative who has indicated his/her understanding and acceptance.   Dental advisory given  Plan Discussed with: CRNA  Anesthesia Plan Comments:        Anesthesia Quick Evaluation

## 2014-11-23 NOTE — Progress Notes (Addendum)
SUBJECTIVE: The patient is stable today.  At this time, she denies chest pain, shortness of breath, or any new concerns.   She is less confused   CURRENT MEDICATIONS: . apixaban  5 mg Oral BID  . carvedilol  6.25 mg Oral BID WC  . diltiazem  120 mg Oral Daily  . dofetilide  125 mcg Oral BID  . magnesium oxide  400 mg Oral BID      OBJECTIVE: Physical Exam: Filed Vitals:   11/22/14 1225 11/22/14 1614 11/22/14 2137 11/23/14 0523  BP: 130/91 130/81 142/90 138/90  Pulse: 101 103 100 103  Temp: 97.9 F (36.6 C)  97.6 F (36.4 C) 97.9 F (36.6 C)  TempSrc: Oral  Oral Oral  Resp: 20  18 16   Height:      Weight:    100 lb 1.6 oz (45.405 kg)  SpO2: 100%   100%    Intake/Output Summary (Last 24 hours) at 11/23/14 0756 Last data filed at 11/23/14 0654  Gross per 24 hour  Intake   1310 ml  Output    325 ml  Net    985 ml    Telemetry reveals sflutter without significant pauses   GEN- The patient is well appearing, alert initially oriented x3 Head- normocephalic, atraumatic Eyes-  Sclera clear, conjunctiva pink Ears- hearing intact Neck- supple  Lungs- Clear to ausculation bilaterally, normal work of breathing Heart- Irregular rate and rhythm  GI- soft, NT, ND, + BS Extremities- no clubbing, cyanosis, or edema Skin- no rash or lesion Psych- euthymic mood, full affect Neuro- strength and sensation are intact  LABS: Basic Metabolic Panel:  Recent Labs  11/22/14 0231 11/23/14 0520  NA 139 141  K 3.9 4.2  CL 104 109  CO2 27 26  GLUCOSE 103* 109*  BUN 21* 14  CREATININE 0.98 0.84  CALCIUM 9.7 9.7  MG 2.2 2.3    CBC: No results for input(s): WBC, NEUTROABS, HGB, HCT, MCV, PLT in the last 72 hours. Cardiac Enzymes: No results for input(s): CKTOTAL, CKMB, CKMBINDEX, TROPONINI in the last 72 hours. Thyroid Function Tests: No results for input(s): TSH, T4TOTAL, T3FREE, THYROIDAB in the last 72 hours.  Invalid input(s): FREET3  RADIOLOGY: Dg Chest 2 View  11/18/2014  CLINICAL DATA:  Acute onset of chest discomfort.  Initial encounter. EXAM: CHEST  2 VIEW COMPARISON:  Chest radiograph performed 11/13/2014 FINDINGS: The lungs are well-aerated. Small bilateral pleural effusions are noted. Bibasilar airspace opacities raise question for mild interstitial edema. There is no evidence of pneumothorax. The heart is mildly enlarged. No acute osseous abnormalities are seen. IMPRESSION: Mild cardiomegaly. Small bilateral pleural effusions noted. Bibasilar airspace opacities raise question for mild interstitial edema. Electronically Signed   By: Garald Balding M.D.   On: 11/18/2014 22:56     ASSESSMENT AND PLAN:  Principal Problem:   Acute on chronic diastolic heart failure (HCC) Active Problems:   Hypertension   Chronic atrial fibrillation (HCC)   CKD (chronic kidney disease), stage III   Essential hypertension   Sinus pause   Hypokalemia    1.  Persistent atrial fibrillation--atrial flutter Tikosyn initiated, she has received 2 doses. EKG reviewed with Dr. Caryl Comes, QTc 406, remains in Aflutter Pt (and husband) had reported compliance with Eliquis without interruption for last 4 weeks Renal function stable   2.  Diastolic heart failure Euvolemic on exam No changes today  3. Confusion delerium, improved  4. Hypokalemia  repleted  5.Bradycardia w atrial flutter much  improved on lower doses of medicine   6. Hypertension   Will try and arrange cardioversion  With improved renal function will increase dofetilide and plan to keep for another 48 hrs   with

## 2014-11-23 NOTE — Progress Notes (Signed)
[facility] Progress Note   PAX VIALL  [id number]  DOB: November 17, 1935  DOA: 11/18/2014 PCP: Henrine Screws, MD  Brief narrative: Donna [name] is a 79 y.o. female with atrial fibrillation, diastolic heart failure, CK D2 presents to the hospital for shortness of breath and swelling of her ankles. She is admitted for acute congestive heart failure.   Subjective: No complaints of shortness of breath cough chest pain.  Assessment/Plan: Principal Problem:   Acute on chronic diastolic heart failure -Given IV furosemide which was discontinued by cardiology as it was felt that she was now euvolemic - weight stable at 45 kg - cont Coreg  Active Problems: Paroxysmal A. Fib/flutter - CHA2DS2-VASc Score 4 -Having multiple pauses - EP is following and has started the patient on Tikosyn- has undergone cardioversion today -Continue Coreg, diltiazem and eliquis  Hospital acquired delirium -Continue sitter at bedside-currently not having any behavioral issues but will order Haldol when necessary if she does  Hypokalemia -Replaced    Hypertension -Stable   Code Status:     Code Status Orders        Start     Ordered   11/21/14 1219  Full code   Continuous     11/21/14 1220     Family Communication:  Disposition Plan: Home in 2 days DVT prophylaxis: Eliquis  Consultants: Cardiology, EP  Antibiotics: Anti-infectives    None      Objective: Filed Weights   11/21/14 0430 11/22/14 0640 11/23/14 0523  Weight: 45.405 kg (100 lb 1.6 oz) 46.947 kg (103 lb 8 oz) 45.405 kg (100 lb 1.6 oz)    Intake/Output Summary (Last 24 hours) at 11/23/14 1430 Last data filed at 11/23/14 1423  Gross per 24 hour  Intake   1800 ml  Output    525 ml  Net   1275 ml     Vitals Filed Vitals:   11/23/14 1415 11/23/14 1417 11/23/14 1420 11/23/14 1425  BP: 134/82   145/76  Pulse: 67 64 68 65  Temp:  98 F (36.7 C)    TempSrc:  Oral    Resp: 24 22 18 27   Height:       Weight:      SpO2: 98% 99% 100% 99%    Exam:  General:  Pt is alert, not in acute distress  HEENT: No icterus, No thrush, oral mucosa moist  Cardiovascular: regular rate and rhythm, S1/S2 No murmur  Respiratory: clear to auscultation bilaterally   Abdomen: Soft, +Bowel sounds, non tender, non distended, no guarding  MSK: No LE edema, cyanosis or clubbing  Data Reviewed: Basic Metabolic Panel:  Recent Labs Lab 11/20/14 0428 11/20/14 1715 11/21/14 0452 11/22/14 0231 11/23/14 0520 11/23/14 0845  NA 139 141 139 139 141 138  K 3.1* 3.9 4.2 3.9 4.2 4.0  CL 106 104 109 104 109 106  CO2 25 27 21* 27 26 24   GLUCOSE 119* 144* 123* 103* 109* 117*  BUN 19 21* 21* 21* 14 13  CREATININE 1.10* 1.22* 1.03* 0.98 0.84 0.75  CALCIUM 9.5 9.7 9.6 9.7 9.7 9.7  MG 1.7  --   --  2.2 2.3  --    Liver Function Tests: No results for input(s): AST, ALT, ALKPHOS, BILITOT, PROT, ALBUMIN in the last 168 hours. No results for input(s): LIPASE, AMYLASE in the last 168 hours. No results for input(s): AMMONIA in the last 168 hours. CBC:  Recent Labs Lab 11/18/14 2233 11/19/14 0613  WBC 4.2 4.6  HGB 13.6 14.8  HCT 40.2 42.5  MCV 95.9 94.4  PLT 160 146*   Cardiac Enzymes:  Recent Labs Lab 11/18/14 2233  TROPONINI <0.03   BNP (last 3 results)  Recent Labs  11/06/14 1006 11/13/14 1005 11/19/14 0036  BNP 927.0* 820.6* 1196.4*    ProBNP (last 3 results) No results for input(s): PROBNP in the last 8760 hours.  CBG: No results for input(s): GLUCAP in the last 168 hours.  No results found for this or any previous visit (from the past 240 hour(s)).   Studies: No results found.  Scheduled Meds:  Scheduled Meds: . apixaban  5 mg Oral BID  . carvedilol  6.25 mg Oral BID WC  . diltiazem  120 mg Oral Daily  . dofetilide  375 mcg Oral BID  . magnesium oxide  400 mg Oral BID  . sodium chloride  3 mL Intravenous Q12H   Continuous Infusions: . sodium chloride    . sodium  chloride 50 mL/hr (11/23/14 0855)    Time spent on care of this patient: 35 min   Kane, MD 11/23/2014, 2:30 PM  LOS: 4 days   [facility] Office  [phone] Pager - Text Page per [email or web address] If 7PM-7AM, please contact night-coverage [email or web address]

## 2014-11-23 NOTE — Anesthesia Postprocedure Evaluation (Signed)
  Anesthesia Post-op Note  Patient: Donna [name]  Procedure(s) Performed: Procedure(s): CARDIOVERSION (N/A)  Patient Location: Endoscopy Unit  Anesthesia Type:MAC  Level of Consciousness: awake, alert  and oriented  Airway and Oxygen Therapy: Patient Spontanous Breathing and Patient connected to nasal cannula oxygen  Post-op Pain: none  Post-op Assessment: Post-op Vital signs reviewed, Patient's Cardiovascular Status Stable, Respiratory Function Stable, Patent Airway, No signs of Nausea or vomiting and Pain level controlled              Post-op Vital Signs: Reviewed and stable  Last Vitals:  Filed Vitals:   11/23/14 1255  BP: 170/110  Pulse: 104  Temp: 36.8 C  Resp: 14    Complications: No apparent anesthesia complications

## 2014-11-23 NOTE — Progress Notes (Signed)
[id number] - EKG QT/QTc = 160/203.  EKG in Epic and shadow chart for review. 0902 - Tikosyn 140mcg PO administered. 1204 - EKG QT/QTc = 318/418.  EKG in Epic and shadow chart for review.

## 2014-11-24 ENCOUNTER — Telehealth: Payer: Self-pay | Admitting: Physician Assistant

## 2014-11-24 LAB — CBC
HCT: 37.1 % (ref 36.0–46.0)
Hemoglobin: 12.3 g/dL (ref 12.0–15.0)
MCH: 31.9 pg (ref 26.0–34.0)
MCHC: 33.2 g/dL (ref 30.0–36.0)
MCV: 96.1 fL (ref 78.0–100.0)
PLATELETS: 140 10*3/uL — AB (ref 150–400)
RBC: 3.86 MIL/uL — AB (ref 3.87–5.11)
RDW: 16.9 % — ABNORMAL HIGH (ref 11.5–15.5)
WBC: 4.3 10*3/uL (ref 4.0–10.5)

## 2014-11-24 LAB — BASIC METABOLIC PANEL
Anion gap: 6 (ref 5–15)
BUN: 15 mg/dL (ref 6–20)
CALCIUM: 9.5 mg/dL (ref 8.9–10.3)
CHLORIDE: 107 mmol/L (ref 101–111)
CO2: 25 mmol/L (ref 22–32)
CREATININE: 0.92 mg/dL (ref 0.44–1.00)
GFR calc non Af Amer: 59 mL/min — ABNORMAL LOW (ref >60)
Glucose, Bld: 104 mg/dL — ABNORMAL HIGH (ref 65–99)
Potassium: 3.8 mmol/L (ref 3.5–5.1)
SODIUM: 138 mmol/L (ref 135–145)

## 2014-11-24 LAB — MAGNESIUM: Magnesium: 2.4 mg/dL (ref 1.7–2.4)

## 2014-11-24 MED ORDER — DILTIAZEM HCL ER COATED BEADS 120 MG PO CP24: 120.0000 mg | ORAL_CAPSULE | Freq: Every day | ORAL | Status: DC

## 2014-11-24 MED ORDER — MAGNESIUM OXIDE 400 (241.3 MG) MG PO TABS
400.0000 mg | ORAL_TABLET | Freq: Two times a day (BID) | ORAL | Status: DC
Start: 2014-11-24 — End: 2015-01-11

## 2014-11-24 MED ORDER — POTASSIUM CHLORIDE CRYS ER 10 MEQ PO TBCR
10.0000 meq | EXTENDED_RELEASE_TABLET | Freq: Every day | ORAL | Status: DC
  Administered 2014-11-24: 10 meq via ORAL
  Filled 2014-11-24: qty 1

## 2014-11-24 MED ORDER — CARVEDILOL 6.25 MG PO TABS: 6.2500 mg | ORAL_TABLET | Freq: Two times a day (BID) | ORAL | Status: DC

## 2014-11-24 MED ORDER — DOFETILIDE 125 MCG PO CAPS: 375.0000 ug | ORAL_CAPSULE | Freq: Two times a day (BID) | ORAL | Status: DC

## 2014-11-24 NOTE — Progress Notes (Signed)
Orders received for pt discharge.  Discharge summary printed and reviewed with husband of pt.   Explained medication regimen, and husband had no further questions at this time.  IV removed and site remains clean, dry, intact.  Telemetry removed.  Husband was given Tikosyn 7-day supply from pharmacy, with 7 refills until 11/2015.  Husband was thoroughly educated on medication administration of Tikosyn.  Pt in stable condition and awaiting transport.

## 2014-11-24 NOTE — Discharge Summary (Signed)
Physician Discharge Summary  Donna [name] [id number] DOB: [birth year] DOA: 11/18/2014  PCP: Henrine Screws, MD  Admit date: 11/18/2014 Discharge date: 11/24/2014  Time spent: 55 minutes  Recommendations for Outpatient Follow-up:  1. F/u with A-fib clinic to be arranged by cardiology  2. Bmet in 1 wk  Discharge Condition: stable    Discharge Diagnoses:  Principal Problem:   Acute on chronic diastolic heart failure (HCC) Active Problems:   Chronic atrial fibrillation (HCC)   Sinus pause   Hypertension   CKD (chronic kidney disease), stage III   Essential hypertension   Hypokalemia   History of present illness:  Donna [name] is a 79 y.o. female with atrial fibrillation, diastolic heart failure, CKD2 presents to the hospital for shortness of breath, a funny feeling in her chest and swelling of her ankles. She was admitted for acute congestive heart failure treatment.  Hospital Course:  Principal Problem:  Acute on chronic diastolic heart failure -Given IV furosemide which was discontinued by cardiology a couple of days ago as it was felt that she was euvolemic - weight down to 44.9 kg from 49.8 kg with diuresis - cont Coreg- resume home dose of Lasix tomorrow AM    Active Problems: Paroxysmal A. Fib/flutter - CHA2DS2-VASc Score 4 -she was having multiple pauses on telemetry   - EP consulted for eval- started the patient on Tikosyn on 11/15- performed successful DCCV on 11/17 -Continue Coreg (dose decreased from 18.75 to 6.25), diltiazem (dose decreased from 180 to 120) and eliquis - will have f/u in A-fib clinic in 4-6 - arrangement per cardiology  Hospital acquired delirium -significantly improved but not at baseline per husband who is at bedside  Hypokalemia -Replaced   Hypertension -Stable  Procedures:  DDCV  Consultations:  Cardiology  EP  Discharge Exam: Filed Weights   11/22/14 0640 11/23/14 0523 11/24/14 0400  Weight: 46.947 kg (103  lb 8 oz) 45.405 kg (100 lb 1.6 oz) 44.997 kg (99 lb 3.2 oz)   Filed Vitals:   11/24/14 1147  BP: 149/79  Pulse: 67  Temp: 98.2 F (36.8 C)  Resp: 16    General: AAO x 3, no distress Cardiovascular: RRR, no murmurs  Respiratory: clear to auscultation bilaterally GI: soft, non-tender, non-distended, bowel sound positive  Discharge Instructions You were cared for by a hospitalist during your hospital stay. If you have any questions about your discharge medications or the care you received while you were in the hospital after you are discharged, you can call the unit and asked to speak with the hospitalist on call if the hospitalist that took care of you is not available. Once you are discharged, your primary care physician will handle any further medical issues. Please note that NO REFILLS for any discharge medications will be authorized once you are discharged, as it is imperative that you return to your primary care physician (or establish a relationship with a primary care physician if you do not have one) for your aftercare needs so that they can reassess your need for medications and monitor your lab values.      Discharge Instructions    AMB Referral to [facility]    Complete by:  As directed   Reason for consult:  [facility] hospital follow up for HF exacerbation  Diagnoses of:  Heart Failure  Expected date of contact:  1-3 days (reserved for hospital discharges)  Please assign to community nurse for transition of care calls and assess for home visits.  Patient likely to discharge per husband on Wednesday or Thursday. Consent signed.Questions please call:  Natividad Brood, RN BSN [facility]iaison  [phone] business mobile phone     Diet - low sodium heart healthy    Complete by:  As directed      Increase activity slowly    Complete by:  As directed             Medication List    TAKE these medications        acetaminophen-codeine 300-30 MG  tablet  Commonly known as:  TYLENOL #3  Take 1-2 tablets by mouth every 6 (six) hours as needed for moderate pain.     apixaban 5 MG Tabs tablet  Commonly known as:  ELIQUIS  Take 1 tablet (5 mg total) by mouth 2 (two) times daily.     carvedilol 6.25 MG tablet  Commonly known as:  COREG  Take 1 tablet (6.25 mg total) by mouth 2 (two) times daily.     diltiazem 120 MG 24 hr capsule  Commonly known as:  CARDIZEM CD  Take 1 capsule (120 mg total) by mouth daily.     dofetilide 125 MCG capsule  Commonly known as:  TIKOSYN  Take 3 capsules (375 mcg total) by mouth 2 (two) times daily.     ESTRACE VAGINAL 0.1 MG/GM vaginal cream  Generic drug:  estradiol  Place 1 Applicatorful vaginally every Monday, Wednesday, and Friday.     furosemide 20 MG tablet  Commonly known as:  LASIX  Take 1 tablet (20 mg total) by mouth daily.     magnesium oxide 400 (241.3 MG) MG tablet  Commonly known as:  MAG-OX  Take 1 tablet (400 mg total) by mouth 2 (two) times daily.     MULTIVITAMIN PO  Take 1 tablet by mouth daily.     nitroGLYCERIN 0.4 MG SL tablet  Commonly known as:  NITROSTAT  Place 1 tablet (0.4 mg total) under the tongue every 5 (five) minutes as needed for chest pain.     potassium chloride SA 20 MEQ tablet  Commonly known as:  KLOR-CON M20  Take 1 tablet (20 mEq total) by mouth daily.     SUPER B COMPLEX PO  Take 1 tablet by mouth daily.       Allergies  Allergen Reactions  . Tagamet [Cimetidine] Hives   Follow-up Information    Schedule an appointment as soon as possible for a visit with Your cardiologist.      Follow up with [facility] On 12/04/2014.   Specialty:  Cardiology   Why:  11:30   Contact information:   [address] [id number] [place] [phone]      Follow up with Sinclair Grooms, MD On 01/11/2015.   Specialty:  Cardiology   Why:  9:30AM   Contact information:   [address] [place] [address] [phone]        The results of significant diagnostics from this hospitalization (including imaging, microbiology, ancillary and laboratory) are listed below for reference.    Significant Diagnostic Studies: Dg Chest 2 View  11/18/2014  CLINICAL DATA:  Acute onset of chest discomfort.  Initial encounter. EXAM: CHEST  2 VIEW COMPARISON:  Chest radiograph performed 11/13/2014 FINDINGS: The lungs are well-aerated. Small bilateral pleural effusions are noted. Bibasilar airspace opacities raise question for mild interstitial edema. There is no evidence of pneumothorax. The heart  is mildly enlarged. No acute osseous abnormalities are seen. IMPRESSION: Mild cardiomegaly. Small bilateral pleural effusions noted. Bibasilar airspace opacities raise question for mild interstitial edema. Electronically Signed   By: Garald Balding M.D.   On: 11/18/2014 22:56   Dg Chest 2 View  11/13/2014  CLINICAL DATA:  Chest discomfort for a few days. Atrial fibrillation. EXAM: CHEST  2 VIEW COMPARISON:  10/12/2014 FINDINGS: There is mild cardiomegaly and vascular congestion. Airspace opacity in both lower lobes could reflect atelectasis or infiltrates. Small bilateral effusions. No acute bony abnormality. IMPRESSION: Small bilateral pleural effusions with bibasilar atelectasis or infiltrates. Electronically Signed   By: Rolm Baptise M.D.   On: 11/13/2014 11:01    Microbiology: No results found for this or any previous visit (from the past 240 hour(s)).   Labs: Basic Metabolic Panel:  Recent Labs Lab 11/20/14 0428  11/21/14 0452 11/22/14 0231 11/23/14 0520 11/23/14 0845 11/24/14 0316  NA 139  < > 139 139 141 138 138  K 3.1*  < > 4.2 3.9 4.2 4.0 3.8  CL 106  < > 109 104 109 106 107  CO2 25  < > 21* 27 26 24 25   GLUCOSE 119*  < > 123* 103* 109* 117* 104*  BUN 19  < > 21* 21* 14 13 15   CREATININE 1.10*  < > 1.03* 0.98 0.84 0.75 0.92  CALCIUM 9.5  < > 9.6 9.7 9.7 9.7  9.5  MG 1.7  --   --  2.2 2.3  --  2.4  < > = values in this interval not displayed. Liver Function Tests: No results for input(s): AST, ALT, ALKPHOS, BILITOT, PROT, ALBUMIN in the last 168 hours. No results for input(s): LIPASE, AMYLASE in the last 168 hours. No results for input(s): AMMONIA in the last 168 hours. CBC:  Recent Labs Lab 11/18/14 2233 11/19/14 0613 11/24/14 0316  WBC 4.2 4.6 4.3  HGB 13.6 14.8 12.3  HCT 40.2 42.5 37.1  MCV 95.9 94.4 96.1  PLT 160 146* 140*   Cardiac Enzymes:  Recent Labs Lab 11/18/14 2233  TROPONINI <0.03   BNP: BNP (last 3 results)  Recent Labs  11/06/14 1006 11/13/14 1005 11/19/14 0036  BNP 927.0* 820.6* 1196.4*    ProBNP (last 3 results) No results for input(s): PROBNP in the last 8760 hours.  CBG: No results for input(s): GLUCAP in the last 168 hours.     SignedDebbe Odea, MD [facility] 11/24/2014, 1:00 PM

## 2014-11-24 NOTE — Telephone Encounter (Signed)
error 

## 2014-11-24 NOTE — Progress Notes (Signed)
SUBJECTIVE: The patient is stable today.  At this time, she denies chest pain, shortness of breath, or any new concerns.     CURRENT MEDICATIONS: . apixaban  5 mg Oral BID  . carvedilol  6.25 mg Oral BID WC  . diltiazem  120 mg Oral Daily  . dofetilide  375 mcg Oral BID  . magnesium oxide  400 mg Oral BID  . sodium chloride  3 mL Intravenous Q12H   . sodium chloride    . sodium chloride 50 mL/hr (11/23/14 0855)    OBJECTIVE: Physical Exam: Filed Vitals:   11/23/14 1442 11/23/14 1741 11/23/14 1935 11/24/14 0400  BP: 115/81 153/75 127/85 152/88  Pulse: 66 70 76 63  Temp: 97.8 F (36.6 C)  97.9 F (36.6 C) 97.2 F (36.2 C)  TempSrc: Oral  Oral Oral  Resp: 18  16 16   Height:      Weight:    99 lb 3.2 oz (44.997 kg)  SpO2: 99%  100% 99%    Intake/Output Summary (Last 24 hours) at 11/24/14 0800 Last data filed at 11/24/14 0200  Gross per 24 hour  Intake   1180 ml  Output    450 ml  Net    730 ml    Telemetry reveals sflutter without significant pauses   GEN- The patient is well appearing, alert initially oriented x 1 Head- normocephalic, atraumatic Eyes-  Sclera clear, conjunctiva pink Ears- hearing intact Neck- supple  Lungs- Clear to ausculation bilaterally, normal work of breathing Heart- Irregular rate and rhythm  GI- soft, NT, ND, + BS Extremities- no clubbing, cyanosis, or edema Skin- no rash or lesion Psych- euthymic mood, full affect Neuro- strength and sensation are intact  ECG QTxc 453    LABS: Basic Metabolic Panel:  Recent Labs  11/23/14 0520 11/23/14 0845 11/24/14 0316  NA 141 138 138  K 4.2 4.0 3.8  CL 109 106 107  CO2 26 24 25   GLUCOSE 109* 117* 104*  BUN 14 13 15   CREATININE 0.84 0.75 0.92  CALCIUM 9.7 9.7 9.5  MG 2.3  --  2.4    CBC:  Recent Labs  11/24/14 0316  WBC 4.3  HGB 12.3  HCT 37.1  MCV 96.1  PLT 140*   Cardiac Enzymes: No results for input(s): CKTOTAL, CKMB, CKMBINDEX, TROPONINI in the last 72  hours. Thyroid Function Tests: No results for input(s): TSH, T4TOTAL, T3FREE, THYROIDAB in the last 72 hours.  Invalid input(s): FREET3  RADIOLOGY: Dg Chest 2 View 11/18/2014  CLINICAL DATA:  Acute onset of chest discomfort.  Initial encounter. EXAM: CHEST  2 VIEW COMPARISON:  Chest radiograph performed 11/13/2014 FINDINGS: The lungs are well-aerated. Small bilateral pleural effusions are noted. Bibasilar airspace opacities raise question for mild interstitial edema. There is no evidence of pneumothorax. The heart is mildly enlarged. No acute osseous abnormalities are seen. IMPRESSION: Mild cardiomegaly. Small bilateral pleural effusions noted. Bibasilar airspace opacities raise question for mild interstitial edema. Electronically Signed   By: Garald Balding M.D.   On: 11/18/2014 22:56     ASSESSMENT AND PLAN:  Principal Problem:   Acute on chronic diastolic heart failure (HCC) Active Problems:   Hypertension   Chronic atrial fibrillation (HCC)   CKD (chronic kidney disease), stage III   Essential hypertension   Sinus pause   Hypokalemia    1.  Persistent atrial fibrillation--atrial flutter S/p DCCV continue dofetlide    2.  Diastolic heart failure Euvolemic on exam No changes  today  3. Confusion delerium vArialbe  4. Hypokalemia  Replete   5.Bradycardia not evident in sinus  6. Hypertension    We will arrange AFib clinic followup in one week  HS 4-6 weeks  Discharge per IM   with

## 2014-11-24 NOTE — Progress Notes (Signed)
Prescription sent to [facility] for a 7 day supply of Tikosyn from the [facility]. Pharmacy to call the patient's nurse when medication is ready. Gabriel Cirri RN is aware. Mindi Slicker [facility] [phone]

## 2014-11-24 NOTE — Progress Notes (Signed)
[id number] - EKG performed = QT/QTc 178/253. 0904 - Tikosyn 375 mcg administered. 1205 - EKG performed - QT/QTc 136/164.

## 2014-11-27 ENCOUNTER — Encounter (HOSPITAL_COMMUNITY): Payer: Self-pay | Admitting: Cardiovascular Disease

## 2014-11-27 ENCOUNTER — Other Ambulatory Visit: Payer: Self-pay | Admitting: *Deleted

## 2014-11-27 NOTE — Patient Outreach (Signed)
First attempt made  to contact pt, follow up for transition of care (discharged 11/18).  HIPPA compliant voice message left with contact number.  If no response, will try again.     Zara Chess.   [facility]  [phone]

## 2014-11-28 ENCOUNTER — Other Ambulatory Visit: Payer: Self-pay | Admitting: *Deleted

## 2014-11-28 NOTE — Patient Outreach (Signed)
Second attempt made to contact pt as part of transition of care.  Pt discharged 11/18.  HIPPA compliant voice message left with contact number.  If no response, will try again.    Zara Chess.   [facility]  [phone]

## 2014-11-29 ENCOUNTER — Encounter: Payer: Self-pay | Admitting: Nurse Practitioner

## 2014-11-29 ENCOUNTER — Other Ambulatory Visit: Payer: Self-pay | Admitting: *Deleted

## 2014-11-29 ENCOUNTER — Ambulatory Visit (INDEPENDENT_AMBULATORY_CARE_PROVIDER_SITE_OTHER): Payer: Commercial Managed Care - HMO | Admitting: Nurse Practitioner

## 2014-11-29 VITALS — BP 150/78 | HR 59 | Ht 62.0 in | Wt 102.6 lb

## 2014-11-29 DIAGNOSIS — I480 Paroxysmal atrial fibrillation: Secondary | ICD-10-CM

## 2014-11-29 LAB — MAGNESIUM: Magnesium: 1.9 mg/dL (ref 1.5–2.5)

## 2014-11-29 LAB — CBC
HCT: 41.6 % (ref 36.0–46.0)
Hemoglobin: 13.5 g/dL (ref 12.0–15.0)
MCH: 31.8 pg (ref 26.0–34.0)
MCHC: 32.5 g/dL (ref 30.0–36.0)
MCV: 97.9 fL (ref 78.0–100.0)
MPV: 11.5 fL (ref 8.6–12.4)
Platelets: 181 10*3/uL (ref 150–400)
RBC: 4.25 MIL/uL (ref 3.87–5.11)
RDW: 16.1 % — ABNORMAL HIGH (ref 11.5–15.5)
WBC: 3.9 10*3/uL — ABNORMAL LOW (ref 4.0–10.5)

## 2014-11-29 LAB — BASIC METABOLIC PANEL
BUN: 21 mg/dL (ref 7–25)
CO2: 21 mmol/L (ref 20–31)
Calcium: 10.1 mg/dL (ref 8.6–10.4)
Chloride: 107 mmol/L (ref 98–110)
Creat: 0.86 mg/dL (ref 0.60–0.93)
Glucose, Bld: 120 mg/dL — ABNORMAL HIGH (ref 65–99)
Potassium: 4.2 mmol/L (ref 3.5–5.3)
Sodium: 141 mmol/L (ref 135–146)

## 2014-11-29 MED ORDER — CARVEDILOL 6.25 MG PO TABS: 6.2500 mg | ORAL_TABLET | Freq: Two times a day (BID) | ORAL | Status: DC

## 2014-11-29 NOTE — Progress Notes (Signed)
CARDIOLOGY OFFICE NOTE  Date:  11/29/2014    Donna [name] Date of Birth: Jul 04, 1935 Medical Record [id number]  PCP:  Henrine Screws, MD  Cardiologist:  Tamala Julian    Chief Complaint  Patient presents with  . Atrial Fibrillation    Post hospital visit - seen for Dr. Tamala Julian    History of Present Illness: Donna [name] is a 79 y.o. female who presents today for a post hospital visit. Seen for Dr. Tamala Julian.   She has follow up next week in AF clinic.   She has a history of PAF/flutter, diastolic heart failure, CKD. hypertension, renal artery stenosis (followed by [facility]), moderate carotid arterial disease 01/2014 (followed by [facility]), valvular heart disease (mod-severe TR and mild MR by echo 08/2014).   Most recently presented to the hospital with dyspnea and swelling. Was admitted with CHF. She was started on Tikosyn for her atrial fib. and underwent cardioversion during that admission. Dr. Caryl Comes had felt that since she had atrial fib and atypical atrial flutter, she would not be an ablation candidate. Coreg dose was decreased as well as Diltiazem during this admission. Eliquis was continued. She is to follow up in AF clinic. This admission was notable for delirium - did improve but not back to baseline at time of discharge.   Comes in today. Here with her husband.  Says they are doing well. No problems. Home since this past Friday. No chest pain. Breathing is good. Little swelling in her feet - sounds like she sits with legs down most of the time. Saw PCP yesterday. He gave her her RX for potassium (was not sent with at discharge). Husband is still giving her Coreg 18.75 mg BID - he is pretty adamant about this - says he knew nothing about reducing the dose. She is not dizzy or lightheaded. She is pleased with how she is doing.   Past Medical History  Diagnosis Date  . Essential hypertension   . Atherosclerosis of renal artery (HCC)     a. s/p R RA stenting;  b. 04/2014 Renal Art duplex:  Patent R RA stent, <60% L RA stenosis. Followed by [facility].  . Fibrocystic breast   . Fibromuscular dysplasia ([place])   . Carotid arterial disease ([place])     a. 01/2014 Carotid U/S: RICA [id number], LICA [id number]. Followed by [facility].  . Paroxysmal atrial fibrillation ([place])     a. Dx 07/26/2014, CHA2DS2VASc = 5-->Eliquis;  c. 08/2014 Echo: EF 55-60%, no rwma, mildly dil LA/RA, mod-sev TR, PASP 29mmHg. b. s/p DCCV 08/2014. c. back in atrial flutter 09/2014 -> rate control pursued.  . Paroxysmal atrial flutter ([place])     a. Dx 07/26/2014, CHA2DS2VASc = 5-->Eliquis;  c. 08/2014 Echo: EF 55-60%, no rwma, mildly dil LA/RA, mod-sev TR, PASP 82mmHg. b. s/p DCCV 08/2014. c. back in atrial flutter 09/2014 -> rate control pursued.  . CKD (chronic kidney disease), stage III     Stage II/III  . Tricuspid regurgitation     a. Echo 08/2014: mod-severe.  . Mitral regurgitation     a. Echo 08/2014: mild MR.  Marland Kitchen Hyperglycemia     Past Surgical History  Procedure Laterality Date  . Renal artery stent  2008    Right renal artery by Dr. Drucie Opitz  . Hemorroidectomy    . Tubal ligation  1970's  . Tonsillectomy    . Cardioversion N/A 09/01/2014    Procedure: CARDIOVERSION;  Surgeon: Josue Hector, MD;  Location: [facility];  Service: Cardiovascular;  Laterality: N/A;  . Cardioversion N/A 11/23/2014    Procedure: CARDIOVERSION;  Surgeon: Skeet Latch, MD;  Location: [facility] ENDOSCOPY;  Service: Cardiovascular;  Laterality: N/A;     Medications: Current Outpatient Prescriptions  Medication Sig Dispense Refill  . acetaminophen-codeine (TYLENOL #3) 300-30 MG per tablet Take 1-2 tablets by mouth every 6 (six) hours as needed for moderate pain. 15 tablet 0  . apixaban (ELIQUIS) 5 MG TABS tablet Take 1 tablet (5 mg total) by mouth 2 (two) times daily. 60 tablet 5  . B Complex-C (SUPER B COMPLEX PO) Take 1 tablet by mouth daily.    . carvedilol (COREG) 6.25 MG tablet Take 1 tablet (6.25 mg total) by mouth 2 (two) times daily. 60 tablet 11    . diltiazem (CARDIZEM CD) 120 MG 24 hr capsule Take 1 capsule (120 mg total) by mouth daily. 30 capsule 0  . dofetilide (TIKOSYN) 125 MCG capsule Take 3 capsules (375 mcg total) by mouth 2 (two) times daily. 21 capsule 0  . ESTRACE VAGINAL 0.1 MG/GM vaginal cream Place 1 Applicatorful vaginally every Monday, Wednesday, and Friday.     . furosemide (LASIX) 20 MG tablet Take 1 tablet (20 mg total) by mouth daily. 30 tablet 0  . magnesium oxide (MAG-OX) 400 (241.3 MG) MG tablet Take 1 tablet (400 mg total) by mouth 2 (two) times daily. 60 tablet 0  . Multiple Vitamin (MULTIVITAMIN PO) Take 1 tablet by mouth daily.     . nitroGLYCERIN (NITROSTAT) 0.4 MG SL tablet Place 1 tablet (0.4 mg total) under the tongue every 5 (five) minutes as needed for chest pain. 25 tablet 3  . potassium chloride SA (KLOR-CON M20) 20 MEQ tablet Take 1 tablet (20 mEq total) by mouth daily. 90 tablet 3   No current facility-administered medications for this visit.    Allergies: Allergies  Allergen Reactions  . Tagamet [Cimetidine] Hives    Social History: The patient  reports that she has never smoked. She has never used smokeless tobacco. She reports that she drinks alcohol. She reports that she does not use illicit drugs.   Family History: The patient's family history includes Aneurysm in her father; Cancer (age of onset: 77) in her sister; Diabetes in her father and mother; Heart attack in her father; Hypertension in her father and mother; Stroke (age of onset: 4) in her mother.   Review of Systems: Please see the history of present illness.   Otherwise, the review of systems is positive for none.   All other systems are reviewed and negative.   Physical Exam: VS:  BP 150/78 mmHg  Pulse 59  Ht 5\' 2"  (1.575 m)  Wt 102 lb 9.6 oz (46.539 kg)  BMI 18.76 kg/m2 .  BMI Body mass index is 18.76 kg/(m^2).  Wt Readings from Last 3 Encounters:  11/29/14 102 lb 9.6 oz (46.539 kg)  11/24/14 99 lb 3.2 oz (44.997 kg)   11/15/14 110 lb (49.896 kg)    General: Pleasant. Very thin and small in stature. She is alert and in no acute distress.  HEENT: Normal. Neck: Supple, no JVD, carotid bruits, or masses noted.  Cardiac: Regular rate and rhythm. Soft systolic murmur noted. Trace pedal edema.  Respiratory:  Lungs are clear to auscultation bilaterally with normal work of breathing.  GI: Soft and nontender.  MS: No deformity or atrophy. Gait and ROM intact. Skin: Warm and dry. Color is normal.  Neuro:  Strength and sensation are intact and no  gross focal deficits noted.  Psych: Alert, appropriate and with normal affect.   LABORATORY DATA:  EKG:  EKG is ordered today. This demonstrates sinus brady with 1st degree AV block.. HR is 59.   Lab Results  Component Value Date   WBC 4.3 11/24/2014   HGB 12.3 11/24/2014   HCT 37.1 11/24/2014   PLT 140* 11/24/2014   GLUCOSE 104* 11/24/2014   ALT 48 07/25/2014   AST 46* 07/25/2014   NA 138 11/24/2014   K 3.8 11/24/2014   CL 107 11/24/2014   CREATININE 0.92 11/24/2014   BUN 15 11/24/2014   CO2 25 11/24/2014   TSH 0.443 11/19/2014   INR 1.54* 11/23/2014    BNP (last 3 results)  Recent Labs  11/06/14 1006 11/13/14 1005 11/19/14 0036  BNP 927.0* 820.6* 1196.4*    ProBNP (last 3 results) No results for input(s): PROBNP in the last 8760 hours.   Other Studies Reviewed Today: MyoviewStudy Highlights from 08/2014     There was no ST segment deviation noted during stress.  This is a low risk study.  Perfusion imaging shows no evidence of ischemia or scar. Study was not gated because of the atrial fibrillation. Consider echocardiogram to assess LV systolic function if needed.   Echo Study Conclusions from 08/2014  - Left ventricle: The cavity size was normal. Wall thickness was increased in a pattern of mild LVH. Systolic function was normal. The estimated ejection fraction was in the range of 55% to 60%. Wall motion was normal; there  were no regional wall motion abnormalities. - Mitral valve: There was mild regurgitation. - Left atrium: The atrium was mildly dilated. - Right atrium: The atrium was mildly dilated. - Tricuspid valve: There was moderate-severe regurgitation. - Pulmonary arteries: Systolic pressure was mildly increased. PA peak pressure: 39 mm Hg (S). - Pericardium, extracardiac: A trivial pericardial effusion was identified.  Impressions:  - Normal LV function; mild biatrial enlargement; mild MR; moderate to severe TR; mild to moderate elevation in pulmonary pressure.  Assessment/Plan: 1. PAF - remains in sinus. Has 1st degree AV block and HR is 59. Will cut the Coreg back as recommended during her admission. Coreg decreased to 6.25 mg BID.  2. AAD therapy with Tikosyn - recheck lab today. QT is 432. Stressed the importance of staying compliant.   3. Chronic anticoagulation - no problems noted.   4. Chronic diastolic HF - not short of breath. Very little edema on exam. I would favor keeping her on her current dose of Lasix, restrict salt, use support stockings, etc.   She has had some confusion ?compliance issues with medicines in the past (see Dayna Dunn's last note) - hopefully this will not be an issue, but I do worry - I get this same sense during our visit that they are not totally clear about medicines.   Current medicines are reviewed with the patient today.  The patient does not have concerns regarding medicines other than what has been noted above.  The following changes have been made:  See above.  Labs/ tests ordered today include:    Orders Placed This Encounter  Procedures  . Basic metabolic panel  . CBC  . Magnesium  . EKG 12-Lead     Disposition:   FU as outlined.    Patient is agreeable to this plan and will call if any problems develop in the interim.   Signed: Burtis Junes, RN, ANP-C 11/29/2014 2:03 PM  [facility]  [address] [place], [place]  [address] Phone: [phone] Fax: [phone]

## 2014-11-29 NOTE — Patient Outreach (Signed)
Attempt made to call pt back, complete transition of care call started today.  HIPPA compliant voice message left with contact number.      Zara Chess.   [facility]  [phone]

## 2014-11-29 NOTE — Patient Outreach (Addendum)
Transition of care call (week 1, discharged 11/18): Spoke with pt, HIPPA verified.   Pt reports doing good, taking one day at a time.   With pt not able to find discharge summary at this time to review medications, f/u appointments- requested RN CM to call back again when spouse is home as he has been helping her.      Plan to f/u again today, complete transition of care call.   Zara Chess.   [facility]  [phone]

## 2014-11-29 NOTE — Patient Instructions (Addendum)
We will be checking the following labs today - BMET, CBC, Mg   Medication Instructions:    Continue with your current medicines - go by this list you are given today - BUT  I am cutting Coreg back to 6.25 mg twice a day - this will be sent to drug store    Testing/Procedures To Be Arranged:  N/A  Follow-Up:   Follow up as outlined    Other Special Instructions:   Get some support/compression stockings to help with your swelling    If you need a refill on your cardiac medications before your next appointment, please call your pharmacy.   Call the [place] office at [phone] if you have any questions, problems or concerns.

## 2014-11-30 ENCOUNTER — Encounter (HOSPITAL_COMMUNITY): Payer: Self-pay | Admitting: *Deleted

## 2014-11-30 ENCOUNTER — Emergency Department (HOSPITAL_COMMUNITY)
Admission: EM | Admit: 2014-11-30 | Discharge: 2014-12-01 | Disposition: A | Discharge status: Home / Self Care | Payer: Commercial Managed Care - HMO | Attending: Emergency Medicine | Admitting: Emergency Medicine

## 2014-11-30 DIAGNOSIS — Z7902 Long term (current) use of antithrombotics/antiplatelets: Secondary | ICD-10-CM | POA: Insufficient documentation

## 2014-11-30 DIAGNOSIS — N183 Chronic kidney disease, stage 3 (moderate): Secondary | ICD-10-CM | POA: Insufficient documentation

## 2014-11-30 DIAGNOSIS — R531 Weakness: Secondary | ICD-10-CM | POA: Diagnosis not present

## 2014-11-30 DIAGNOSIS — R5381 Other malaise: Secondary | ICD-10-CM | POA: Insufficient documentation

## 2014-11-30 DIAGNOSIS — R5383 Other fatigue: Secondary | ICD-10-CM | POA: Diagnosis present

## 2014-11-30 DIAGNOSIS — R05 Cough: Secondary | ICD-10-CM | POA: Diagnosis not present

## 2014-11-30 DIAGNOSIS — Z8742 Personal history of other diseases of the female genital tract: Secondary | ICD-10-CM | POA: Diagnosis not present

## 2014-11-30 DIAGNOSIS — R011 Cardiac murmur, unspecified: Secondary | ICD-10-CM | POA: Diagnosis not present

## 2014-11-30 DIAGNOSIS — I480 Paroxysmal atrial fibrillation: Secondary | ICD-10-CM | POA: Diagnosis not present

## 2014-11-30 DIAGNOSIS — Z79899 Other long term (current) drug therapy: Secondary | ICD-10-CM | POA: Diagnosis not present

## 2014-11-30 DIAGNOSIS — I129 Hypertensive chronic kidney disease with stage 1 through stage 4 chronic kidney disease, or unspecified chronic kidney disease: Secondary | ICD-10-CM | POA: Insufficient documentation

## 2014-11-30 LAB — I-STAT TROPONIN, ED: TROPONIN I, POC: 0.01 ng/mL (ref 0.00–0.08)

## 2014-11-30 LAB — CBC
HCT: 35.9 % — ABNORMAL LOW (ref 36.0–46.0)
Hemoglobin: 12 g/dL (ref 12.0–15.0)
MCH: 32.1 pg (ref 26.0–34.0)
MCHC: 33.4 g/dL (ref 30.0–36.0)
MCV: 96 fL (ref 78.0–100.0)
PLATELETS: 152 10*3/uL (ref 150–400)
RBC: 3.74 MIL/uL — ABNORMAL LOW (ref 3.87–5.11)
RDW: 15.8 % — AB (ref 11.5–15.5)
WBC: 4.3 10*3/uL (ref 4.0–10.5)

## 2014-11-30 NOTE — ED Notes (Signed)
Pt states she woke up "feeling bad." not able to provide specific details. Does acknowledge that her chest is "uncomfortable."

## 2014-11-30 NOTE — ED Provider Notes (Signed)
CSN: [id number]     Arrival date & time 11/30/14  2305 History  By signing my name below, I, Hansel Feinstein, attest that this documentation has been prepared under the direction and in the presence of Varney Biles, MD. Electronically Signed: Hansel Feinstein, ED Scribe. 12/01/2014. 12:48 AM.    Chief Complaint  Patient presents with  . Chest Pain   The history is provided by the patient. No language interpreter was used.    HPI Comments: Donna [name] is a 79 y.o. female with h/o HTN, atherosclerosis of renal artery, carotid arterial disease, paroxysmal Afib and atrial flutter, CKD, tricuspid and mitral regurgitation, hyperglycemia, fibromuscular dysplasia who presents to the Emergency Department complaining of moderate generalized fatigue onset 2 days ago. She states associated dry cough. Pt states that she drinks only water. Pt was recently admitted to the hospital and discharged a week ago. She states that she was feeling well after discharge. Husband reports that he picked up the 90 day bid 325 mg prescription of Tikosyn but has misplaced them. No other new medications. She denies HA, congestion, rhinorrhea, teary eyes, sore throat, neck pain or stiffness, CP, exertional or nonexertional SOB, palpitations, abdominal pain, nausea, vomiting, diarrhea, dysuria, hematuria, frequency, urgency, rash, myalgias, dizziness, lightheadedness. Pt also denies recent trauma, falls, blood loss.    Past Medical History  Diagnosis Date  . Essential hypertension   . Atherosclerosis of renal artery (HCC)     a. s/p R RA stenting;  b. 04/2014 Renal Art duplex: Patent R RA stent, <60% L RA stenosis. Followed by [facility].  . Fibrocystic breast   . Fibromuscular dysplasia ([place])   . Carotid arterial disease ([place])     a. 01/2014 Carotid U/S: RICA [id number], LICA AB-[id number]. Followed by [facility].  . Paroxysmal atrial fibrillation ([place])     a. Dx 07/26/2014, CHA2DS2VASc = 5-->Eliquis;  c. 08/2014 Echo: EF 55-60%, no rwma, mildly dil LA/RA, mod-sev  TR, PASP 9mmHg. b. s/p DCCV 08/2014. c. back in atrial flutter 09/2014 -> rate control pursued.  . Paroxysmal atrial flutter ([place])     a. Dx 07/26/2014, CHA2DS2VASc = 5-->Eliquis;  c. 08/2014 Echo: EF 55-60%, no rwma, mildly dil LA/RA, mod-sev TR, PASP 63mmHg. b. s/p DCCV 08/2014. c. back in atrial flutter 09/2014 -> rate control pursued.  . CKD (chronic kidney disease), stage III     Stage II/III  . Tricuspid regurgitation     a. Echo 08/2014: mod-severe.  . Mitral regurgitation     a. Echo 08/2014: mild MR.  Marland Kitchen Hyperglycemia    Past Surgical History  Procedure Laterality Date  . Renal artery stent  2008    Right renal artery by Dr. Drucie Opitz  . Hemorroidectomy    . Tubal ligation  1970's  . Tonsillectomy    . Cardioversion N/A 09/01/2014    Procedure: CARDIOVERSION;  Surgeon: Josue Hector, MD;  Location: [facility] ENDOSCOPY;  Service: Cardiovascular;  Laterality: N/A;  . Cardioversion N/A 11/23/2014    Procedure: CARDIOVERSION;  Surgeon: Skeet Latch, MD;  Location: [facility] ENDOSCOPY;  Service: Cardiovascular;  Laterality: N/A;   Family History  Problem Relation Age of Onset  . Stroke Mother 84  . Diabetes Mother   . Hypertension Mother   . Aneurysm Father     abdominal aortic aneurysm  . Diabetes Father   . Hypertension Father   . Cancer Sister 93    breast cancer  . Heart attack Father    Social History  Substance Use Topics  .  Smoking status: Never Smoker   . Smokeless tobacco: Never Used  . Alcohol Use: Yes   OB History    Gravida Para Term Preterm AB TAB SAB Ectopic Multiple Living   4 4 4  0 0 0 0 0 0 4     Review of Systems  Constitutional: Positive for fatigue.  HENT: Negative for congestion and sore throat.   Eyes: Negative for discharge.  Respiratory: Positive for cough. Negative for shortness of breath.   Cardiovascular: Negative for chest pain and palpitations.  Gastrointestinal: Negative for nausea, vomiting and abdominal pain.  Genitourinary: Negative for  dysuria, urgency, frequency and hematuria.  Musculoskeletal: Negative for myalgias, neck pain and neck stiffness.  Skin: Negative for rash.  Neurological: Negative for dizziness, light-headedness and headaches.   A complete 10 system review of systems was obtained and all systems are negative except as noted in the HPI and PMH.    Allergies  Tagamet  Home Medications   Prior to Admission medications   Medication Sig Start Date End Date Taking? Authorizing Provider  acetaminophen-codeine (TYLENOL #3) 300-30 MG per tablet Take 1-2 tablets by mouth every 6 (six) hours as needed for moderate pain. 09/05/14  Yes Domenic Moras, PA-C  apixaban (ELIQUIS) 5 MG TABS tablet Take 1 tablet (5 mg total) by mouth 2 (two) times daily. 08/09/14  Yes Rogelia Mire, NP  B Complex-C (SUPER B COMPLEX PO) Take 1 tablet by mouth daily.   Yes Historical Provider, MD  carvedilol (COREG) 6.25 MG tablet Take 1 tablet (6.25 mg total) by mouth 2 (two) times daily. 11/29/14  Yes Burtis Junes, NP  diltiazem (CARDIZEM CD) 120 MG 24 hr capsule Take 1 capsule (120 mg total) by mouth daily. 11/24/14  Yes Debbe Odea, MD  ESTRACE VAGINAL 0.1 MG/GM vaginal cream Place 1 Applicatorful vaginally every Monday, Wednesday, and Friday.  08/23/14  Yes Historical Provider, MD  furosemide (LASIX) 20 MG tablet Take 1 tablet (20 mg total) by mouth daily. Patient taking differently: Take 20 mg by mouth every other day.  11/19/14  Yes [name], MD  magnesium oxide (MAG-OX) 400 (241.3 MG) MG tablet Take 1 tablet (400 mg total) by mouth 2 (two) times daily. Patient taking differently: Take 400 mg by mouth every morning.  11/24/14  Yes Debbe Odea, MD  Multiple Vitamin (MULTIVITAMIN PO) Take 1 tablet by mouth daily.    Yes Historical Provider, MD  nitroGLYCERIN (NITROSTAT) 0.4 MG SL tablet Place 1 tablet (0.4 mg total) under the tongue every 5 (five) minutes as needed for chest pain. 11/14/14  Yes Dayna N Dunn, PA-C  potassium  chloride SA (KLOR-CON M20) 20 MEQ tablet Take 1 tablet (20 mEq total) by mouth daily. 09/28/14  Yes Dayna N Dunn, PA-C  dofetilide (TIKOSYN) 125 MCG capsule Take 3 capsules (375 mcg total) by mouth 2 (two) times daily. 12/01/14   Amoni Scallan, MD   BP 115/76 mmHg  Pulse 60  Temp(Src) 97.9 F (36.6 C) (Oral)  Resp 16  SpO2 100% Physical Exam  Constitutional: She is oriented to person, place, and time. She appears well-developed and well-nourished.  HENT:  Head: Normocephalic and atraumatic.  Eyes: Conjunctivae and EOM are normal. Pupils are equal, round, and reactive to light.  Neck: Normal range of motion. Neck supple.  Cardiovascular: Normal rate.  An irregularly irregular rhythm present.  Murmur heard. 2+ and equal radial pulses.  Pulmonary/Chest: Effort normal. No respiratory distress.  Lungs CTA bilaterally.   Abdominal: Soft.  She exhibits no distension. There is no tenderness.  Musculoskeletal: Normal range of motion.  Lymphadenopathy:    She has no cervical adenopathy.  Neurological: She is alert and oriented to person, place, and time.  Skin: Skin is warm and dry.  Psychiatric: She has a normal mood and affect. Her behavior is normal.  Nursing note and vitals reviewed.  ED Course  Procedures (including critical care time) DIAGNOSTIC STUDIES: Oxygen Saturation is 100% on RA, normal by my interpretation.    COORDINATION OF CARE: 12:46 AM Discussed treatment plan with pt at bedside and pt agreed to plan.   Labs Review Labs Reviewed  BASIC METABOLIC PANEL - Abnormal; Notable for the following:    Glucose, Bld 121 (*)    BUN 23 (*)    Creatinine, Ser 1.10 (*)    GFR calc non Af Amer 47 (*)    GFR calc Af Amer 55 (*)    All other components within normal limits  CBC - Abnormal; Notable for the following:    RBC 3.74 (*)    HCT 35.9 (*)    RDW 15.8 (*)    All other components within normal limits  URINE CULTURE  URINALYSIS, ROUTINE W REFLEX MICROSCOPIC (NOT AT  [facility])  I-STAT TROPOININ, ED  Randolm Idol, ED    Imaging Review Dg Chest 2 View  12/01/2014  CLINICAL DATA:  Patient woke up feeling bad and with chest discomfort. EXAM: CHEST  2 VIEW COMPARISON:  11/18/2014 FINDINGS: Diffuse cardiac enlargement. No pulmonary vascular congestion or edema. Hyperinflation. No focal airspace disease or consolidation. No blunting of costophrenic angles. No pneumothorax. Calcified and tortuous aorta. Degenerative changes in the spine. IMPRESSION: Cardiac enlargement hyperinflation. No evidence of active pulmonary disease. Electronically Signed   By: Lucienne Capers M.D.   On: 12/01/2014 00:15   I have personally reviewed and evaluated these images and lab results as part of my medical decision-making.   EKG Interpretation   Date/Time:  Thursday November 30 2014 23:16:38 EST Ventricular Rate:  71 PR Interval:  254 QRS Duration: 70 QT Interval:  394 QTC Calculation: 428 R Axis:   120 Text Interpretation:  Sinus rhythm with 1st degree A-V block with frequent  Premature ventricular complexes Low voltage QRS Anterolateral infarct ,  age undetermined Abnormal ECG Rightward axis When compared with ECG of  11/24/2014, No significant change was found Confirmed by [facility]  MD, DAVID  ([id number]) on 11/30/2014 11:18:05 PM      MDM   Final diagnoses:  Generalized weakness    I personally performed the services described in this documentation, which was scribed in my presence. The recorded information has been reviewed and is accurate.  Pt comes in with cc of generalized weakness. ROS is negative GROSSLY except for + MALAISE AND WEAKNESS. Trops x 2 ordered and ekg is wnl. CBC shows no anemia. Pt has no uti like symptoms (we missed the urine sample, and patient preferred just going home 4 hours into her stay - so UA ordered but never received). CXR is neg. Ambulatory pulsox is normal.  Pt wanted refill on her tikosyn - she lost the meds at home. Refill  provided. Advised that she sees her Cardiologist to figure out how long she needs to be on it. We will give her 60 tablets. Husband reports that pt was given 90 days worth of supply, i informed them that i dont feel comfortable giving them such a long duration prescription, and they understand.   Strict  return precautions discussed.    Varney Biles, MD 12/01/14 [id number]

## 2014-12-01 ENCOUNTER — Emergency Department (HOSPITAL_COMMUNITY): Payer: Commercial Managed Care - HMO

## 2014-12-01 DIAGNOSIS — R531 Weakness: Secondary | ICD-10-CM | POA: Diagnosis not present

## 2014-12-01 LAB — BASIC METABOLIC PANEL
Anion gap: 7 (ref 5–15)
BUN: 23 mg/dL — ABNORMAL HIGH (ref 6–20)
CALCIUM: 9.9 mg/dL (ref 8.9–10.3)
CO2: 23 mmol/L (ref 22–32)
CREATININE: 1.1 mg/dL — AB (ref 0.44–1.00)
Chloride: 108 mmol/L (ref 101–111)
GFR calc non Af Amer: 47 mL/min — ABNORMAL LOW (ref >60)
GFR, EST AFRICAN AMERICAN: 55 mL/min — AB (ref >60)
GLUCOSE: 121 mg/dL — AB (ref 65–99)
Potassium: 4 mmol/L (ref 3.5–5.1)
Sodium: 138 mmol/L (ref 135–145)

## 2014-12-01 LAB — I-STAT TROPONIN, ED: Troponin i, poc: 0 ng/mL (ref 0.00–0.08)

## 2014-12-01 MED ORDER — DOFETILIDE 250 MCG PO CAPS
375.0000 ug | ORAL_CAPSULE | Freq: Once | ORAL | Status: AC
  Administered 2014-12-01: 375 ug via ORAL
  Filled 2014-12-01: qty 1

## 2014-12-01 MED ORDER — DOFETILIDE 125 MCG PO CAPS: 375.0000 ug | ORAL_CAPSULE | Freq: Two times a day (BID) | ORAL | Status: DC

## 2014-12-01 NOTE — ED Notes (Signed)
Dr.Nanavati at bedside; pt just ambulated to restroom and is dressed and ready to be discharged

## 2014-12-01 NOTE — ED Notes (Signed)
Dr. Nanavati at bedside 

## 2014-12-01 NOTE — ED Notes (Signed)
Ambulated patient in hallway while checking pulse oximetry. Prior to ambulation patient pulse rate was 62 and oxygen saturation level was 100% on room air. After walking with patient in hallway patient pulse rate increased to 83 and oxygen levels remained at 100% on room air. Pt denies any further shortness of breath and dizziness.

## 2014-12-01 NOTE — ED Notes (Addendum)
Pt called out numerous times requesting discharge. Informed patient of delay; Dr.Nanvati made aware

## 2014-12-01 NOTE — Discharge Instructions (Signed)
We saw you in the ER for All the results in the ER are normal, labs and imaging. We are not sure what is causing your symptoms. The workup in the ER is not complete, and is limited to screening for life threatening and emergent conditions only, so please see a primary care doctor for further evaluation.  Please return to the ER if your symptoms worsen; you have increased pain, fevers, chills, inability to keep any medications down, confusion. Otherwise see the outpatient doctor as requested.   Weakness Weakness is a lack of strength. It may be felt all over the body (generalized) or in one specific part of the body (focal). Some causes of weakness can be serious. You may need further medical evaluation, especially if you are elderly or you have a history of immunosuppression (such as chemotherapy or HIV), kidney disease, heart disease, or diabetes. CAUSES  Weakness can be caused by many different things, including:  Infection.  Physical exhaustion.  Internal bleeding or other blood loss that results in a lack of red blood cells (anemia).  Dehydration. This cause is more common in elderly people.  Side effects or electrolyte abnormalities from medicines, such as pain medicines or sedatives.  Emotional distress, anxiety, or depression.  Circulation problems, especially severe peripheral arterial disease.  Heart disease, such as rapid atrial fibrillation, bradycardia, or heart failure.  Nervous system disorders, such as Guillain-Barr syndrome, multiple sclerosis, or stroke. DIAGNOSIS  To find the cause of your weakness, your caregiver will take your history and perform a physical exam. Lab tests or X-rays may also be ordered, if needed. TREATMENT  Treatment of weakness depends on the cause of your symptoms and can vary greatly. HOME CARE INSTRUCTIONS   Rest as needed.  Eat a well-balanced diet.  Try to get some exercise every day.  Only take over-the-counter or prescription  medicines as directed by your caregiver. SEEK MEDICAL CARE IF:   Your weakness seems to be getting worse or spreads to other parts of your body.  You develop new aches or pains. SEEK IMMEDIATE MEDICAL CARE IF:   You cannot perform your normal daily activities, such as getting dressed and feeding yourself.  You cannot walk up and down stairs, or you feel exhausted when you do so.  You have shortness of breath or chest pain.  You have difficulty moving parts of your body.  You have weakness in only one area of the body or on only one side of the body.  You have a fever.  You have trouble speaking or swallowing.  You cannot control your bladder or bowel movements.  You have black or bloody vomit or stools. MAKE SURE YOU:  Understand these instructions.  Will watch your condition.  Will get help right away if you are not doing well or get worse.   This information is not intended to replace advice given to you by your health care provider. Make sure you discuss any questions you have with your health care provider.   Document Released: 12/23/2004 Document Revised: 06/24/2011 Document Reviewed: 02/21/2011 Elsevier Interactive Patient Education [employer].

## 2014-12-04 ENCOUNTER — Ambulatory Visit (HOSPITAL_COMMUNITY)
Admit: 2014-12-04 | Discharge: 2014-12-04 | Disposition: A | Discharge status: Home / Self Care | Payer: Commercial Managed Care - HMO | Source: Ambulatory Visit | Attending: Nurse Practitioner | Admitting: Nurse Practitioner

## 2014-12-04 ENCOUNTER — Other Ambulatory Visit: Payer: Self-pay | Admitting: *Deleted

## 2014-12-04 VITALS — BP 138/82 | HR 74 | Ht 61.0 in | Wt 97.4 lb

## 2014-12-04 DIAGNOSIS — I481 Persistent atrial fibrillation: Secondary | ICD-10-CM

## 2014-12-04 DIAGNOSIS — I4819 Other persistent atrial fibrillation: Secondary | ICD-10-CM

## 2014-12-04 DIAGNOSIS — I5032 Chronic diastolic (congestive) heart failure: Secondary | ICD-10-CM | POA: Insufficient documentation

## 2014-12-04 NOTE — Patient Outreach (Signed)
Attempt made to contact pt as part of transition of care (discharged 11/18).    HIPPA compliant voice message left with contact number.  If no response, will try again.      Zara Chess.   [place] [place] [facility]  [phone]

## 2014-12-05 ENCOUNTER — Other Ambulatory Visit: Payer: Self-pay | Admitting: *Deleted

## 2014-12-05 NOTE — Patient Outreach (Signed)
Another attempt made to contact pt as part of transition of care (discharged 11/18).  HIPPA compliant voice message left with contact number.   If no response, will try again.     Zara Chess.   [place] [facility]  [phone]

## 2014-12-05 NOTE — Patient Outreach (Signed)
Called pt telephone number. This house hold is tired of getting phone calls you can tell, their answering machine says basically you are a lucky winner by call ing this number and if you want a call back to see what you won leave your number. I did leave my name and number and a brief message and requested Ms. Karraker to return my call.  Deloria Lair [facility] [phone]

## 2014-12-05 NOTE — Progress Notes (Addendum)
Patient ID: TA GAME, female   DOB: 12/12/1935, 79 y.o.   MRN: [id number]     Primary Care Physician: Henrine Screws, MD Referring Physician: [facility] f/u   Donna [name] is a 79 y.o. female with a h/o persisitent afib, diastolic heart failure, that was admitted 11/12 thru 11/18 for acute on chronic diastolic heart failure and afib. She was diuresed and decision was made to start her on tikosyn. She was noted to have multiple pauses on telemetry and a holter monitor prior to hospitalization showed (2) 3 second pauses. She was discharged on 375 mg of tikosyn. The pt developed some hospital acquired delirium which cleared by d/c. She also returned to the ER for weakness over thanksgiving weekend but no significant findings and she was d/c home.  Today in the afib clinic, the pt and husband are confused re medications, which is a repetitive issue, looking back on other providers notes. Apparently, they were given one week of tiksoyn, at d/c, which I believe she took, but then husband states that he had the tiksoyn rx filled at [place] but lost the pills and when he went back with another rx(he did ask ER MD for another prescription, [facility] told him it could not be filled until 12/4 and it would cost $400.When I called [facility], the pharmacist said a RX for the intial prescription was never filled but they did have records of him bringing the second rx in and trying to fill at $400. So, the pt has not had any tikosyn now for one week, despite this, she continues in SR, fluid status stable.  Today, she denies symptoms of palpitations, chest pain, shortness of breath, orthopnea, PND, lower extremity edema, dizziness, presyncope, syncope, or neurologic sequela. The patient is tolerating medications without difficulties and is otherwise without complaint today.   Past Medical History  Diagnosis Date  . Essential hypertension   . Atherosclerosis of renal artery (HCC)     a. s/p R RA stenting;   b. 04/2014 Renal Art duplex: Patent R RA stent, <60% L RA stenosis. Followed by [facility].  . Fibrocystic breast   . Fibromuscular dysplasia ([place])   . Carotid arterial disease ([place])     a. 01/2014 Carotid U/S: RICA [id number], LICA [id number]. Followed by [facility].  . Paroxysmal atrial fibrillation ([place])     a. Dx 07/26/2014, CHA2DS2VASc = 5-->Eliquis;  c. 08/2014 Echo: EF 55-60%, no rwma, mildly dil LA/RA, mod-sev TR, PASP 12mmHg. b. s/p DCCV 08/2014. c. back in atrial flutter 09/2014 -> rate control pursued.  . Paroxysmal atrial flutter ([place])     a. Dx 07/26/2014, CHA2DS2VASc = 5-->Eliquis;  c. 08/2014 Echo: EF 55-60%, no rwma, mildly dil LA/RA, mod-sev TR, PASP 69mmHg. b. s/p DCCV 08/2014. c. back in atrial flutter 09/2014 -> rate control pursued.  . CKD (chronic kidney disease), stage III     Stage II/III  . Tricuspid regurgitation     a. Echo 08/2014: mod-severe.  . Mitral regurgitation     a. Echo 08/2014: mild MR.  Marland Kitchen Hyperglycemia    Past Surgical History  Procedure Laterality Date  . Renal artery stent  2008    Right renal artery by Dr. Drucie Opitz  . Hemorroidectomy    . Tubal ligation  1970's  . Tonsillectomy    . Cardioversion N/A 09/01/2014    Procedure: CARDIOVERSION;  Surgeon: Josue Hector, MD;  Location: [facility];  Service: Cardiovascular;  Laterality: N/A;  . Cardioversion N/A 11/23/2014  Procedure: CARDIOVERSION;  Surgeon: Skeet Latch, MD;  Location[facility] ENDOSCOPY;  Service: Cardiovascular;  Laterality: N/A;    Current Outpatient Prescriptions  Medication Sig Dispense Refill  . acetaminophen-codeine (TYLENOL #3) 300-30 MG per tablet Take 1-2 tablets by mouth every 6 (six) hours as needed for moderate pain. 15 tablet 0  . apixaban (ELIQUIS) 5 MG TABS tablet Take 1 tablet (5 mg total) by mouth 2 (two) times daily. 60 tablet 5  . B Complex-C (SUPER B COMPLEX PO) Take 1 tablet by mouth daily.    . carvedilol (COREG) 6.25 MG tablet Take 1 tablet (6.25 mg total) by mouth 2 (two) times daily. 60  tablet 11  . diltiazem (CARDIZEM CD) 120 MG 24 hr capsule Take 1 capsule (120 mg total) by mouth daily. 30 capsule 0  . ESTRACE VAGINAL 0.1 MG/GM vaginal cream Place 1 Applicatorful vaginally every Monday, Wednesday, and Friday.     . furosemide (LASIX) 20 MG tablet Take 1 tablet (20 mg total) by mouth daily. (Patient taking differently: Take 20 mg by mouth every other day. ) 30 tablet 0  . magnesium oxide (MAG-OX) 400 (241.3 MG) MG tablet Take 1 tablet (400 mg total) by mouth 2 (two) times daily. (Patient taking differently: Take 400 mg by mouth every morning. ) 60 tablet 0  . Multiple Vitamin (MULTIVITAMIN PO) Take 1 tablet by mouth daily.     . nitroGLYCERIN (NITROSTAT) 0.4 MG SL tablet Place 1 tablet (0.4 mg total) under the tongue every 5 (five) minutes as needed for chest pain. 25 tablet 3  . potassium chloride SA (KLOR-CON M20) 20 MEQ tablet Take 1 tablet (20 mEq total) by mouth daily. 90 tablet 3  . dofetilide (TIKOSYN) 125 MCG capsule Take 3 capsules (375 mcg total) by mouth 2 (two) times daily. (Patient not taking: Reported on 12/04/2014) 60 capsule 0   No current facility-administered medications for this encounter.    Allergies  Allergen Reactions  . Tagamet [Cimetidine] Hives    Social History   Social History  . Marital Status: Married    Spouse Name: N/A  . Number of Children: N/A  . Years of Education: N/A   Occupational History  . Not on file.   Social History Main Topics  . Smoking status: Never Smoker   . Smokeless tobacco: Never Used  . Alcohol Use: Yes  . Drug Use: No  . Sexual Activity: Yes    Birth Control/ Protection: Surgical     Comment: BTL   Other Topics Concern  . Not on file   Social History Narrative    Family History  Problem Relation Age of Onset  . Stroke Mother 81  . Diabetes Mother   . Hypertension Mother   . Aneurysm Father     abdominal aortic aneurysm  . Diabetes Father   . Hypertension Father   . Cancer Sister 37    breast  cancer  . Heart attack Father     ROS- All systems are reviewed and negative except as per the HPI above  Physical Exam: Filed Vitals:   12/04/14 1151  BP: 138/82  Pulse: 74  Height: 5\' 1"  (1.549 m)  Weight: 97 lb 6.4 oz (44.18 kg)    GEN- The patient is well appearing, alert and oriented x 3 today.   Head- normocephalic, atraumatic Eyes-  Sclera clear, conjunctiva pink Ears- hearing intact Oropharynx- clear Neck- supple, no JVP Lymph- no cervical lymphadenopathy Lungs- Clear to ausculation bilaterally, normal work of breathing  Heart- Regular rate and rhythm, no murmurs, rubs or gallops, PMI not laterally displaced GI- soft, NT, ND, + BS Extremities- no clubbing, cyanosis, or edema MS- no significant deformity or atrophy Skin- no rash or lesion Psych- euthymic mood, full affect Neuro- strength and sensation are intact  EKG- SR with first degree AV block, with PVC's, septal/lateral infarct age undetermined, pr int 224 ms, qrs 72 ms, qtc 426 ms Epic records reviewed Phone conversations with pharmacist at [facility] and Plan: 1. afib Pt recently loaded on tikosyn, but off drug x one week,husband states he picked up drug and lost it The husband/wife, with multiple providers, seem to be very confused re medications and I have asked them to bring all meds with every visit. Pt defers to husband to answer questions re meds and appears to may have some degree of dementia. It is very worrisome being on antiarrythmic's, especially tikosyn, and having so much continued confusion re meds. I will refer back to Dr. Caryl Comes, who saw pt in the hospital, to discuss next step if NSR is to be maintained, 12/8 at 1:20 with Chanetta Marshall, NP. Wife states she is willing to come back into hospital to be reloaded if necessary. For now continue rate control meds, apixaban.  2. Diastolic heart failure Stable Continue lasix Avoid salt  3. H/o pauses Observed on telemetry  and documented on  holter monitor(3 sec) Apparently asymptomatic with this currently  Butch Penny C. [place] Bula, [facility] [address] [place], [place] [address] [phone]   Addendum- pt's husband called to office am after appointment and said  he did find the original rx of dofetilide  that was filled by [facility], 11/21,( which I was told it was never filled by pharmacy staff) but was never started, therefore without tiksoyn for many days, he was told not to restart but to take all meds to his appointment 12/8 and use of drug would be further discussed.

## 2014-12-05 NOTE — Addendum Note (Signed)
Encounter addended by: Sherran Needs, NP on: 12/05/2014 10:55 AM<BR>     Documentation filed: Notes Section

## 2014-12-06 ENCOUNTER — Emergency Department (HOSPITAL_COMMUNITY)
Admission: EM | Admit: 2014-12-06 | Discharge: 2014-12-06 | Disposition: A | Discharge status: Home / Self Care | Payer: Commercial Managed Care - HMO | Attending: Emergency Medicine | Admitting: Emergency Medicine

## 2014-12-06 ENCOUNTER — Emergency Department (HOSPITAL_COMMUNITY): Payer: Commercial Managed Care - HMO

## 2014-12-06 ENCOUNTER — Encounter (HOSPITAL_COMMUNITY): Payer: Self-pay | Admitting: Neurology

## 2014-12-06 DIAGNOSIS — I129 Hypertensive chronic kidney disease with stage 1 through stage 4 chronic kidney disease, or unspecified chronic kidney disease: Secondary | ICD-10-CM | POA: Insufficient documentation

## 2014-12-06 DIAGNOSIS — R5383 Other fatigue: Secondary | ICD-10-CM | POA: Insufficient documentation

## 2014-12-06 DIAGNOSIS — Z7902 Long term (current) use of antithrombotics/antiplatelets: Secondary | ICD-10-CM | POA: Diagnosis not present

## 2014-12-06 DIAGNOSIS — N183 Chronic kidney disease, stage 3 (moderate): Secondary | ICD-10-CM | POA: Diagnosis not present

## 2014-12-06 DIAGNOSIS — Z79899 Other long term (current) drug therapy: Secondary | ICD-10-CM | POA: Diagnosis not present

## 2014-12-06 DIAGNOSIS — I480 Paroxysmal atrial fibrillation: Secondary | ICD-10-CM | POA: Insufficient documentation

## 2014-12-06 DIAGNOSIS — I4892 Unspecified atrial flutter: Secondary | ICD-10-CM | POA: Diagnosis not present

## 2014-12-06 DIAGNOSIS — Z8742 Personal history of other diseases of the female genital tract: Secondary | ICD-10-CM | POA: Insufficient documentation

## 2014-12-06 DIAGNOSIS — Z8639 Personal history of other endocrine, nutritional and metabolic disease: Secondary | ICD-10-CM | POA: Diagnosis not present

## 2014-12-06 DIAGNOSIS — R531 Weakness: Secondary | ICD-10-CM

## 2014-12-06 LAB — CBC
HCT: 38.4 % (ref 36.0–46.0)
HEMOGLOBIN: 13 g/dL (ref 12.0–15.0)
MCH: 32.6 pg (ref 26.0–34.0)
MCHC: 33.9 g/dL (ref 30.0–36.0)
MCV: 96.2 fL (ref 78.0–100.0)
PLATELETS: 204 10*3/uL (ref 150–400)
RBC: 3.99 MIL/uL (ref 3.87–5.11)
RDW: 15.5 % (ref 11.5–15.5)
WBC: 4.1 10*3/uL (ref 4.0–10.5)

## 2014-12-06 LAB — BASIC METABOLIC PANEL
ANION GAP: 9 (ref 5–15)
BUN: 26 mg/dL — ABNORMAL HIGH (ref 6–20)
CALCIUM: 10.3 mg/dL (ref 8.9–10.3)
CO2: 25 mmol/L (ref 22–32)
Chloride: 105 mmol/L (ref 101–111)
Creatinine, Ser: 1.04 mg/dL — ABNORMAL HIGH (ref 0.44–1.00)
GFR, EST AFRICAN AMERICAN: 59 mL/min — AB (ref >60)
GFR, EST NON AFRICAN AMERICAN: 50 mL/min — AB (ref >60)
Glucose, Bld: 161 mg/dL — ABNORMAL HIGH (ref 65–99)
Potassium: 4.4 mmol/L (ref 3.5–5.1)
SODIUM: 139 mmol/L (ref 135–145)

## 2014-12-06 LAB — I-STAT TROPONIN, ED: TROPONIN I, POC: 0 ng/mL (ref 0.00–0.08)

## 2014-12-06 NOTE — Discharge Instructions (Signed)
It was our pleasure to provide your ER care today - we hope that you feel better.  Follow up with primary care doctor/cardiologist in the coming week - discuss your medications then.  Your heart rate does get as low as 48, which may be related to your beta blocker medication (coreg) - discuss meds/doses, with your doctor/cardiologist, and whether they feel adjustment of your medications may help your symptoms.    Given recent feelings of depression, you may also discuss with them possibly adding a medication for mood.  Return to ER if worse, new symptoms, fevers, chest pain, worsening breathing, fainting, other concern.      Fatigue Fatigue is feeling tired all of the time, a lack of energy, or a lack of motivation. Occasional or mild fatigue is often a normal response to activity or life in general. However, long-lasting (chronic) or extreme fatigue may indicate an underlying medical condition. HOME CARE INSTRUCTIONS  Watch your fatigue for any changes. The following actions may help to lessen any discomfort you are feeling:  Talk to your health care provider about how much sleep you need each night. Try to get the required amount every night.  Take medicines only as directed by your health care provider.  Eat a healthy and nutritious diet. Ask your health care provider if you need help changing your diet.  Drink enough fluid to keep your urine clear or pale yellow.  Practice ways of relaxing, such as yoga, meditation, massage therapy, or acupuncture.  Exercise regularly.   Change situations that cause you stress. Try to keep your work and personal routine reasonable.  Do not abuse illegal drugs.  Limit alcohol intake to no more than 1 drink per day for nonpregnant women and 2 drinks per day for men. One drink equals 12 ounces of beer, 5 ounces of wine, or 1 ounces of hard liquor.  Take a multivitamin, if directed by your health care provider. SEEK MEDICAL CARE IF:   Your  fatigue does not get better.  You have a fever.   You have unintentional weight loss or gain.  You have headaches.   You have difficulty:   Falling asleep.  Sleeping throughout the night.  You feel angry, guilty, anxious, or sad.   You are unable to have a bowel movement (constipation).   You skin is dry.   Your legs or another part of your body is swollen.  SEEK IMMEDIATE MEDICAL CARE IF:   You feel confused.   Your vision is blurry.  You feel faint or pass out.   You have a severe headache.   You have severe abdominal, pelvic, or back pain.   You have chest pain, shortness of breath, or an irregular or fast heartbeat.   You are unable to urinate or you urinate less than normal.   You develop abnormal bleeding, such as bleeding from the rectum, vagina, nose, lungs, or nipples.  You vomit blood.   You have thoughts about harming yourself or committing suicide.   You are worried that you might harm someone else.    This information is not intended to replace advice given to you by your health care provider. Make sure you discuss any questions you have with your health care provider.   Document Released: 10/20/2006 Document Revised: 01/13/2014 Document Reviewed: 04/26/2013 Elsevier Interactive Patient Education 2016 Elsevier Inc[employer]    Weakness Weakness is a lack of strength. It may be felt all over the body (generalized) or in one specific  part of the body (focal). Some causes of weakness can be serious. You may need further medical evaluation, especially if you are elderly or you have a history of immunosuppression (such as chemotherapy or HIV), kidney disease, heart disease, or diabetes. CAUSES  Weakness can be caused by many different things, including:  Infection.  Physical exhaustion.  Internal bleeding or other blood loss that results in a lack of red blood cells (anemia).  Dehydration. This cause is more common in elderly  people.  Side effects or electrolyte abnormalities from medicines, such as pain medicines or sedatives.  Emotional distress, anxiety, or depression.  Circulation problems, especially severe peripheral arterial disease.  Heart disease, such as rapid atrial fibrillation, bradycardia, or heart failure.  Nervous system disorders, such as Guillain-Barr syndrome, multiple sclerosis, or stroke. DIAGNOSIS  To find the cause of your weakness, your caregiver will take your history and perform a physical exam. Lab tests or X-rays may also be ordered, if needed. TREATMENT  Treatment of weakness depends on the cause of your symptoms and can vary greatly. HOME CARE INSTRUCTIONS   Rest as needed.  Eat a well-balanced diet.  Try to get some exercise every day.  Only take over-the-counter or prescription medicines as directed by your caregiver. SEEK MEDICAL CARE IF:   Your weakness seems to be getting worse or spreads to other parts of your body.  You develop new aches or pains. SEEK IMMEDIATE MEDICAL CARE IF:   You cannot perform your normal daily activities, such as getting dressed and feeding yourself.  You cannot walk up and down stairs, or you feel exhausted when you do so.  You have shortness of breath or chest pain.  You have difficulty moving parts of your body.  You have weakness in only one area of the body or on only one side of the body.  You have a fever.  You have trouble speaking or swallowing.  You cannot control your bladder or bowel movements.  You have black or bloody vomit or stools. MAKE SURE YOU:  Understand these instructions.  Will watch your condition.  Will get help right away if you are not doing well or get worse.   This information is not intended to replace advice given to you by your health care provider. Make sure you discuss any questions you have with your health care provider.   Document Released: 12/23/2004 Document Revised: 06/24/2011  Document Reviewed: 02/21/2011 Elsevier Interactive Patient Education 2016 Else[employer] Inc[employer]    Bradycardia Bradycardia is a slower-than-normal heart rate. A normal resting heart rate for an adult ranges from 60 to 100 beats per minute. With bradycardia, the resting heart rate is less than 60 beats per minute. Bradycardia is a problem if your heart cannot pump enough oxygen-rich blood through your body. Bradycardia is not a problem for everyone. For some healthy adults, a slow resting heart rate is normal.  CAUSES  Bradycardia may be caused by:  A problem with the heart's electrical system, such as heart block.  A problem with the heart's natural pacemaker (sinus node).  Heart disease, damage, or infection.  Certain medicines that treat heart conditions.  Certain conditions, such as hypothyroidism and obstructive sleep apnea. RISK FACTORS  Risk factors include:  Being 49 or older.  Having high blood pressure (hypertension), high cholesterol (hyperlipidemia), or diabetes.  Drinking heavily, using tobacco products, or using drugs.  Being stressed. SIGNS AND SYMPTOMS  Signs and symptoms include:  Light-headedness.  Faintingor near fainting.  Fatigue  and weakness.  Shortness of breath.  Chest pain (angina).  Drowsiness.  Confusion.  Dizziness. DIAGNOSIS  Diagnosis of bradycardia may include:  A physical exam.  An electrocardiogram (ECG).  Blood tests. TREATMENT  Treatment for bradycardia may include:  Treatment of an underlying condition.  Pacemaker placement. A pacemaker is a small, battery-powered device that is placed under the skin and is programmed to sense your heartbeats. If your heart rate is lower than the programmed rate, the pacemaker will pace your heart.  Changing your medicines or dosages. HOME CARE INSTRUCTIONS  Take medicines only as directed by your health care provider.  Manage any health conditions that contribute to bradycardia as  directed by your health care provider.  Follow a heart-healthy diet. A dietitian can help educate you on healthy food options and changes.  Follow an exercise program approved by your health care provider.  Maintain a healthy weight. Lose weight as approved by your health care provider.  Do not use tobacco products, including cigarettes, chewing tobacco, or electronic cigarettes. If you need help quitting, ask your health care provider.  Do not use illegal drugs.  Limit alcohol intake to no more than 1 drink per day for nonpregnant women and 2 drinks per day for men. One drink equals 12 ounces of beer, 5 ounces of wine, or 1 ounces of hard liquor.  Keep all follow-up visits as directed by your health care provider. This is important. SEEK MEDICAL CARE IF:  You feel light-headed or dizzy.  You almost faint.  You feel weak or are easily fatigued during physical activity.  You experience confusion or have memory problems. SEEK IMMEDIATE MEDICAL CARE IF:   You faint.  You have an irregular heartbeat.  You have chest pain.  You have trouble breathing. MAKE SURE YOU:   Understand these instructions.  Will watch your condition.  Will get help right away if you are not doing well or get worse.   This information is not intended to replace advice given to you by your health care provider. Make sure you discuss any questions you have with your health care provider.   Document Released: 09/14/2001 Document Revised: 01/13/2014 Document Reviewed: 03/30/2013 Elsevier Interactive Patient Education [employer].

## 2014-12-06 NOTE — ED Notes (Addendum)
Pt reports generalized weakness since this morning, has hx of afib and this is how she feels when it occurs. Denies cp or sob. Pt ambulatory, a x 4. In NAD

## 2014-12-06 NOTE — ED Provider Notes (Signed)
CSN: [id number]     Arrival date & time 12/06/14  1336 History   First MD Initiated Contact with Patient 12/06/14 1510     Chief Complaint  Patient presents with  . Weakness     (Consider location/radiation/quality/duration/timing/severity/associated sxs/prior Treatment) Patient is a 79 y.o. female presenting with weakness. The history is provided by the patient and the spouse.  Weakness Pertinent negatives include no chest pain, no abdominal pain, no headaches and no shortness of breath.  Pt c/o generally feeling weak, not normal energy level, for past couple months. Symptoms constant, persistent. No acute or abrupt worsening today. Compliant w normal meds. Denies any focal or unilateral numbness or weakness. No change in vision or speech. No loss of normal function.  No fever or chills. No cough or uri c/o. No chest pain. No sob. No abd pain. No vomiting or diarrhea. No dysuria. ?couple lb wt decreased, but states normal appetite, eating normally. No blood loss, rectal or vaginal bleeding.       Past Medical History  Diagnosis Date  . Essential hypertension   . Atherosclerosis of renal artery (HCC)     a. s/p R RA stenting;  b. 04/2014 Renal Art duplex: Patent R RA stent, <60% L RA stenosis. Followed by [facility].  . Fibrocystic breast   . Fibromuscular dysplasia ([place])   . Carotid arterial disease ([place])     a. 01/2014 Carotid U/S: RICA [id number], LICA [id number]. Followed by [facility].  . Paroxysmal atrial fibrillation ([place])     a. Dx 07/26/2014, CHA2DS2VASc = 5-->Eliquis;  c. 08/2014 Echo: EF 55-60%, no rwma, mildly dil LA/RA, mod-sev TR, PASP 6mmHg. b. s/p DCCV 08/2014. c. back in atrial flutter 09/2014 -> rate control pursued.  . Paroxysmal atrial flutter ([place])     a. Dx 07/26/2014, CHA2DS2VASc = 5-->Eliquis;  c. 08/2014 Echo: EF 55-60%, no rwma, mildly dil LA/RA, mod-sev TR, PASP 80mmHg. b. s/p DCCV 08/2014. c. back in atrial flutter 09/2014 -> rate control pursued.  . CKD (chronic kidney disease), stage III      Stage II/III  . Tricuspid regurgitation     a. Echo 08/2014: mod-severe.  . Mitral regurgitation     a. Echo 08/2014: mild MR.  Marland Kitchen Hyperglycemia    Past Surgical History  Procedure Laterality Date  . Renal artery stent  2008    Right renal artery by Dr. Drucie Opitz  . Hemorroidectomy    . Tubal ligation  1970's  . Tonsillectomy    . Cardioversion N/A 09/01/2014    Procedure: CARDIOVERSION;  Surgeon: Josue Hector, MD;  Location: [facility] ENDOSCOPY;  Service: Cardiovascular;  Laterality: N/A;  . Cardioversion N/A 11/23/2014    Procedure: CARDIOVERSION;  Surgeon: Skeet Latch, MD;  Location: [facility] ENDOSCOPY;  Service: Cardiovascular;  Laterality: N/A;   Family History  Problem Relation Age of Onset  . Stroke Mother 53  . Diabetes Mother   . Hypertension Mother   . Aneurysm Father     abdominal aortic aneurysm  . Diabetes Father   . Hypertension Father   . Cancer Sister 63    breast cancer  . Heart attack Father    Social History  Substance Use Topics  . Smoking status: Never Smoker   . Smokeless tobacco: Never Used  . Alcohol Use: Yes   OB History    Gravida Para Term Preterm AB TAB SAB Ectopic Multiple Living   4 4 4  0 0 0 0 0 0 4     Review  of Systems  Constitutional: Negative for fever and chills.  HENT: Negative for sore throat.   Eyes: Negative for visual disturbance.  Respiratory: Negative for shortness of breath.   Cardiovascular: Negative for chest pain.  Gastrointestinal: Negative for vomiting, abdominal pain, diarrhea and blood in stool.  Endocrine: Negative for polyuria.  Genitourinary: Negative for dysuria and flank pain.  Musculoskeletal: Negative for back pain and neck pain.  Skin: Negative for rash.  Neurological: Positive for weakness. Negative for numbness and headaches.  Hematological: Does not bruise/bleed easily.  Psychiatric/Behavioral: Negative for confusion.       Intermittent mild feelings of depression, mainly about health. No acute or abrupt  change.       Allergies  Tagamet  Home Medications   Prior to Admission medications   Medication Sig Start Date End Date Taking? Authorizing Provider  acetaminophen-codeine (TYLENOL #3) 300-30 MG per tablet Take 1-2 tablets by mouth every 6 (six) hours as needed for moderate pain. 09/05/14   Domenic Moras, PA-C  apixaban (ELIQUIS) 5 MG TABS tablet Take 1 tablet (5 mg total) by mouth 2 (two) times daily. 08/09/14   Rogelia Mire, NP  B Complex-C (SUPER B COMPLEX PO) Take 1 tablet by mouth daily.    Historical Provider, MD  carvedilol (COREG) 6.25 MG tablet Take 1 tablet (6.25 mg total) by mouth 2 (two) times daily. 11/29/14   Burtis Junes, NP  diltiazem (CARDIZEM CD) 120 MG 24 hr capsule Take 1 capsule (120 mg total) by mouth daily. 11/24/14   Debbe Odea, MD  dofetilide (TIKOSYN) 125 MCG capsule Take 3 capsules (375 mcg total) by mouth 2 (two) times daily. Patient not taking: Reported on 12/04/2014 12/01/14   Varney Biles, MD  ESTRACE VAGINAL 0.1 MG/GM vaginal cream Place 1 Applicatorful vaginally every Monday, Wednesday, and Friday.  08/23/14   Historical Provider, MD  furosemide (LASIX) 20 MG tablet Take 1 tablet (20 mg total) by mouth daily. Patient taking differently: Take 20 mg by mouth every other day.  11/19/14   [name], MD  magnesium oxide (MAG-OX) 400 (241.3 MG) MG tablet Take 1 tablet (400 mg total) by mouth 2 (two) times daily. Patient taking differently: Take 400 mg by mouth every morning.  11/24/14   Debbe Odea, MD  Multiple Vitamin (MULTIVITAMIN PO) Take 1 tablet by mouth daily.     Historical Provider, MD  nitroGLYCERIN (NITROSTAT) 0.4 MG SL tablet Place 1 tablet (0.4 mg total) under the tongue every 5 (five) minutes as needed for chest pain. 11/14/14   Dayna N Dunn, PA-C  potassium chloride SA (KLOR-CON M20) 20 MEQ tablet Take 1 tablet (20 mEq total) by mouth daily. 09/28/14   Dayna N Dunn, PA-C   BP 149/69 mmHg  Pulse 59  Temp(Src) 97.9 F (36.6 C)  (Oral)  Resp 20  SpO2 99% Physical Exam  Constitutional: She is oriented to person, place, and time. She appears well-developed and well-nourished. No distress.  HENT:  Head: Atraumatic.  Mouth/Throat: Oropharynx is clear and moist.  Eyes: Conjunctivae are normal. Pupils are equal, round, and reactive to light. No scleral icterus.  Neck: Normal range of motion. Neck supple. No tracheal deviation present. No thyromegaly present.  Cardiovascular: Normal rate, normal heart sounds and intact distal pulses.  Exam reveals no gallop and no friction rub.   No murmur heard. Pulmonary/Chest: Effort normal and breath sounds normal. No respiratory distress.  Abdominal: Soft. Normal appearance and bowel sounds are normal. She exhibits no distension  and no mass. There is no tenderness. There is no rebound and no guarding.  Genitourinary:  No cva tenderness  Musculoskeletal: She exhibits no edema.  Neurological: She is alert and oriented to person, place, and time.  Speech clear/fluent. Ambulates w steady gait.,   Skin: Skin is warm and dry. No rash noted. She is not diaphoretic.  Psychiatric: She has a normal mood and affect.  Nursing note and vitals reviewed.   ED Course  Procedures (including critical care time) Labs Review   Results for orders placed or performed during the hospital encounter of [id number]  Basic metabolic panel  Result Value Ref Range   Sodium 139 135 - 145 mmol/L   Potassium 4.4 3.5 - 5.1 mmol/L   Chloride 105 101 - 111 mmol/L   CO2 25 22 - 32 mmol/L   Glucose, Bld 161 (H) 65 - 99 mg/dL   BUN 26 (H) 6 - 20 mg/dL   Creatinine, Ser 1.04 (H) 0.44 - 1.00 mg/dL   Calcium 10.3 8.9 - 10.3 mg/dL   GFR calc non Af Amer 50 (L) >60 mL/min   GFR calc Af Amer 59 (L) >60 mL/min   Anion gap 9 5 - 15  CBC  Result Value Ref Range   WBC 4.1 4.0 - 10.5 K/uL   RBC 3.99 3.87 - 5.11 MIL/uL   Hemoglobin 13.0 12.0 - 15.0 g/dL   HCT 38.4 36.0 - 46.0 %   MCV 96.2 78.0 - 100.0 fL   MCH  32.6 26.0 - 34.0 pg   MCHC 33.9 30.0 - 36.0 g/dL   RDW 15.5 11.5 - 15.5 %   Platelets 204 150 - 400 K/uL  I-stat troponin, ED (not at [facility])  Result Value Ref Range   Troponin i, poc 0.00 0.00 - 0.08 ng/mL   Comment 3           Dg Chest 2 View  12/06/2014  CLINICAL DATA:  Weakness and malaise for several days. EXAM: CHEST  2 VIEW COMPARISON:  12/01/2014 FINDINGS: Mild cardiomegaly is stable. Both lungs are clear. No evidence of pneumothorax or pleural effusion. Mild thoracolumbar spine degenerative changes and scoliosis again noted. IMPRESSION: Stable mild cardiomegaly.  No active lung disease. Electronically Signed   By: Earle Gell M.D.   On: 12/06/2014 14:05        I have personally reviewed and evaluated these images and lab results as part of my medical decision-making.   EKG Interpretation   Date/Time:  Wednesday December 06 2014 13:42:35 EST Ventricular Rate:  59 PR Interval:  232 QRS Duration: 72 QT Interval:  404 QTC Calculation: 399 R Axis:   127 Text Interpretation:  Sinus bradycardia with 1st degree A-V block with  occasional Premature ventricular complexes and Premature atrial complexes  Rightward axis No significant change since last tracing Confirmed by  Ashok Cordia  MD, Lennette Bihari ([id number]) on 12/06/2014 3:19:30 PM      MDM   Iv ns. Continuous pulse ox and monitor. Labs.  Reviewed nursing notes and prior charts for additional history.   Labs unremarkable.  Several other recent visits w similar c/o.  Pt has appt in coming week w cardiologist, in part to 'bring all meds/discuss meds', and also has pcp w whom she can follow up closely.  Hr as low as 48, sinus.  No syncope, cp or sob.  Pt is on bblocker and multiple other meds.  Feel symptoms may be related to meds. Pt also endorses some intermittent mild  depression.  Will refer to pcp/card for further assessment, possible med adjustment.      Lajean Saver, MD 12/06/14 [phone]

## 2014-12-07 ENCOUNTER — Other Ambulatory Visit: Payer: Self-pay | Admitting: *Deleted

## 2014-12-07 ENCOUNTER — Encounter: Payer: Self-pay | Admitting: *Deleted

## 2014-12-07 NOTE — Patient Outreach (Signed)
Another attempt made to f/u with pt as part of transition of care (discharged 11/18):  HIPPA compliant voice message left with name and contact number.   Numerous attempts have been made to reach this  pt =  11/23-  to complete first transition of care call, 11/28- f/u on recent ED visit, 11/29 - one attempt by this RN CM, one by coworker RN,  Today 12/1- attempt to f/u on ED visit 11/30.   If no response, plan to send unable to contact letter.     Zara Chess.   [facility]  [phone]

## 2014-12-14 ENCOUNTER — Encounter: Payer: Self-pay | Admitting: Internal Medicine

## 2014-12-14 ENCOUNTER — Ambulatory Visit (INDEPENDENT_AMBULATORY_CARE_PROVIDER_SITE_OTHER): Payer: Commercial Managed Care - HMO | Admitting: Internal Medicine

## 2014-12-14 VITALS — BP 134/84 | HR 107 | Ht 62.0 in | Wt 102.4 lb

## 2014-12-14 DIAGNOSIS — I4891 Unspecified atrial fibrillation: Secondary | ICD-10-CM

## 2014-12-14 MED ORDER — FUROSEMIDE 20 MG PO TABS: ORAL_TABLET | ORAL | Status: DC

## 2014-12-14 MED ORDER — CARVEDILOL 6.25 MG PO TABS: 6.2500 mg | ORAL_TABLET | Freq: Two times a day (BID) | ORAL | Status: DC

## 2014-12-14 MED ORDER — DILTIAZEM HCL ER COATED BEADS 180 MG PO CP24: 180.0000 mg | ORAL_CAPSULE | Freq: Every day | ORAL | Status: DC

## 2014-12-14 NOTE — Patient Instructions (Signed)
Medication Instructions: 1) Increase Cardizem CD (Diltiazem) to 180 mg one capsule by mouth once daily  Labwork: - none  Procedures/Testing: - none  Follow-Up: - Dr. Caryl Comes will see you back on an as needed basis.  - Follow up with Dr. Tamala Julian as scheduled on Thursday 01/11/15 at 9:30 am.  Any Additional Special Instructions Will Be Listed Below (If Applicable). - none

## 2014-12-15 NOTE — Progress Notes (Signed)
Patient Care Team: Josetta Huddle, MD as PCP - General (Internal Medicine) Lyman Speller, RN as [facility]   HPI  Donna [name] is a 79 y.o. female Seen followup for atrial fib after having seen in hospital She hx of HFpEF and thought potentiallly attributed to AF so antiarrhythmic therapy initiaed with dofetilde  However, she ended up stopping at home after they lost the prescription   There has been concern, given dementia, that possibly not the right drug for her  She has no complaints of   chest pain, shortness of breath, nocturnal dyspnea, orthopnea or peripheral edema.  There have been no palpitations, lightheadedness or syncope.    She is very pleasant   Records and Results Reviewed outpt notes   Past Medical History  Diagnosis Date  . Essential hypertension   . Atherosclerosis of renal artery (HCC)     a. s/p R RA stenting;  b. 04/2014 Renal Art duplex: Patent R RA stent, <60% L RA stenosis. [facility] by [facility].  . Fibrocystic breast   . Fibromuscular dysplasia ([place])   . Carotid arterial disease ([place])     a. 01/2014 Carotid U/S: RICA [id number], LICA [id number]. Followed by [facility].  . Paroxysmal atrial fibrillation ([place])     a. Dx 07/26/2014, CHA2DS2VASc = 5-->Eliquis;  c. 08/2014 Echo: EF 55-60%, no rwma, mildly dil LA/RA, mod-sev TR, PASP 46mmHg. b. s/p DCCV 08/2014. c. back in atrial flutter 09/2014 -> rate control pursued.  . Paroxysmal atrial flutter ([place])     a. Dx 07/26/2014, CHA2DS2VASc = 5-->Eliquis;  c. 08/2014 Echo: EF 55-60%, no rwma, mildly dil LA/RA, mod-sev TR, PASP 7mmHg. b. s/p DCCV 08/2014. c. back in atrial flutter 09/2014 -> rate control pursued.  . CKD (chronic kidney disease), stage III     Stage II/III  . Tricuspid regurgitation     a. Echo 08/2014: mod-severe.  . Mitral regurgitation     a. Echo 08/2014: mild MR.  Marland Kitchen Hyperglycemia     Past Surgical History  Procedure Laterality Date  . Renal artery stent  2008    Right renal artery  by Dr. Drucie Opitz  . Hemorroidectomy    . Tubal ligation  1970's  . Tonsillectomy    . Cardioversion N/A 09/01/2014    Procedure: CARDIOVERSION;  Surgeon: Josue Hector, MD;  Location: [place];  Service: Cardiovascular;  Laterality: N/A;  . Cardioversion N/A 11/23/2014    Procedure: CARDIOVERSION;  Surgeon: Skeet Latch, MD;  Location: [facility] ENDOSCOPY;  Service: Cardiovascular;  Laterality: N/A;    Current Outpatient Prescriptions  Medication Sig Dispense Refill  . apixaban (ELIQUIS) 5 MG TABS tablet Take 1 tablet (5 mg total) by mouth 2 (two) times daily. 60 tablet 5  . B Complex-C (SUPER B COMPLEX PO) Take 1 tablet by mouth daily.    . carvedilol (COREG) 6.25 MG tablet Take 1 tablet (6.25 mg total) by mouth 2 (two) times daily. 180 tablet 3  . ESTRACE VAGINAL 0.1 MG/GM vaginal cream Place 1 Applicatorful vaginally every Monday, Wednesday, and Friday.     . magnesium oxide (MAG-OX) 400 (241.3 MG) MG tablet Take 1 tablet (400 mg total) by mouth 2 (two) times daily. (Patient taking differently: Take 400 mg by mouth every morning. ) 60 tablet 0  . Multiple Vitamin (MULTIVITAMIN PO) Take 1 tablet by mouth daily.     . nitroGLYCERIN (NITROSTAT) 0.4 MG SL tablet Place 1 tablet (0.4 mg total) under  the tongue every 5 (five) minutes as needed for chest pain. 25 tablet 3  . potassium chloride SA (KLOR-CON M20) 20 MEQ tablet Take 1 tablet (20 mEq total) by mouth daily. 90 tablet 3  . acetaminophen-codeine (TYLENOL #3) 300-30 MG per tablet Take 1-2 tablets by mouth every 6 (six) hours as needed for moderate pain. (Patient not taking: Reported on 12/14/2014) 15 tablet 0  . diltiazem (CARDIZEM CD) 180 MG 24 hr capsule Take 1 capsule (180 mg total) by mouth daily. 90 capsule 3  . furosemide (LASIX) 20 MG tablet Take one tablet (20 mg) by mouth ever other day on Monday, Wednesday, Friday 90 tablet 1   No current facility-administered medications for this visit.    Allergies  Allergen Reactions  .  Tagamet [Cimetidine] Hives      Review of Systems negative except from HPI and PMH  Physical Exam BP 134/84 mmHg  Pulse 107  Ht 5\' 2"  (1.575 m)  Wt 102 lb 6.4 oz (46.448 kg)  BMI 18.72 kg/m2 Well developed and well nourished in no acute distress HENT normal E scleral and icterus clear Neck Supple JVP flat; carotids brisk and full Clear to ausculation Rapid Regular rate and rhythm, no murmurs gallops or rub Soft with active bowel sounds No clubbing cyanosis  Edema Alert and oriented x 1, grossly normal motor and sensory function Skin Warm and Dry  ECG atrial flutter 2:1 107 Assessment and  Plan Atrial flutter  Dementia  HFpEF  TERF age, htn gender vasc dis >=5   Pt is taking apixoban apparently on schedule under her husbands direction Will continue Will increase rate control with dilt 120>>180 although not sanguine will be able to slow flutter She is to see [facility] in just a few weeks at which time would suggest we consider amio if she still has attributable symptoms and wth hx of pauses she may need pacing support so would watch carefully

## 2014-12-27 ENCOUNTER — Encounter: Payer: Self-pay | Admitting: *Deleted

## 2014-12-27 ENCOUNTER — Other Ambulatory Visit: Payer: Self-pay | Admitting: *Deleted

## 2015-01-02 ENCOUNTER — Telehealth: Payer: Self-pay | Admitting: *Deleted

## 2015-01-02 NOTE — Telephone Encounter (Signed)
Called patient to let him know that we have samples of eliquis available for her to pick up.

## 2015-01-10 DIAGNOSIS — R413 Other amnesia: Secondary | ICD-10-CM | POA: Diagnosis not present

## 2015-01-10 DIAGNOSIS — N898 Other specified noninflammatory disorders of vagina: Secondary | ICD-10-CM | POA: Diagnosis not present

## 2015-01-10 DIAGNOSIS — N952 Postmenopausal atrophic vaginitis: Secondary | ICD-10-CM | POA: Diagnosis not present

## 2015-01-10 NOTE — Progress Notes (Signed)
Cardiology Office Note   Date:  01/11/2015   ID:  Donna [name], DOB [birth year], MRN [id number]  PCP:  Henrine Screws, MD  Cardiologist:  Sinclair Grooms, MD   Chief Complaint  Patient presents with  . Hypertension  . Atrial Fibrillation      History of Present Illness: Donna [name] is a 80 y.o. 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  Donna [name] is accompanied by her husband and stepdaughter. She has no cardiopulmonary complaints. There is concern and confusion about her cardiovascular therapy. She is also concerned about her prognosis. She denies exertional fatigue. She has not had syncope or lower extremity edema. When she saw Dr. Caryl Comes on December 8, diltiazem was increased to 180 mg per day for rate control. With the stepdaughter present, it becomes clear to me that the patient has been on Tikosyn 375 g twice a day since discharge from the hospital. It was not clear at the office visit with Dr. Caryl Comes that the patient was on Tikosyn. Heart rate today is 66 bpm. No complications on anticoagulation therapy.  The stepdaughter has been very helpful today. The medication tray has been brought in by the patient's stepdaughter. For greater than 1 month they have been taking dofetilide 375 g twice daily of picks up and 5 mg twice daily carvedilol 6.25 mg twice daily magnesium oxide 1 tablet 2 times daily potassium 20 mEq per day furosemide 20 mg per day. Diltiazem which was increased to 180 mg daily on the last visit with Dr. Caryl Comes has never been started.  Past Medical History  Diagnosis Date  . Essential hypertension   . Atherosclerosis of renal artery (HCC)     a. s/p R RA stenting;  b. 04/2014 Renal Art duplex: Patent R RA stent, <60% L RA stenosis. Followed by [facility].  . Fibrocystic breast   . Fibromuscular dysplasia ([place])   .  Carotid arterial disease ([place])     a. 01/2014 Carotid U/S: RICA [id number], LICA AB-[id number]. Followed by [facility].  . Paroxysmal atrial fibrillation ([place])     a. Dx 07/26/2014, CHA2DS2VASc = 5-->Eliquis;  c. 08/2014 Echo: EF 55-60%, no rwma, mildly dil LA/RA, mod-sev TR, PASP 75mmHg. b. s/p DCCV 08/2014. c. back in atrial flutter 09/2014 -> rate control pursued.  . Paroxysmal atrial flutter ([place])     a. Dx 07/26/2014, CHA2DS2VASc = 5-->Eliquis;  c. 08/2014 Echo: EF 55-60%, no rwma, mildly dil LA/RA, mod-sev TR, PASP 44mmHg. b. s/p DCCV 08/2014. c. back in atrial flutter 09/2014 -> rate control pursued.  . CKD (chronic kidney disease), stage III     Stage II/III  . Tricuspid regurgitation     a. Echo 08/2014: mod-severe.  . Mitral regurgitation     a. Echo 08/2014: mild MR.  Marland Kitchen Hyperglycemia     Past Surgical History  Procedure Laterality Date  . Renal artery stent  2008    Right renal artery by Dr. Drucie Opitz  . Hemorroidectomy    . Tubal ligation  1970's  . Tonsillectomy    . Cardioversion N/A 09/01/2014    Procedure: CARDIOVERSION;  Surgeon: Josue Hector, MD;  Location: [facility] ENDOSCOPY;  Service: Cardiovascular;  Laterality: N/A;  . Cardioversion N/A 11/23/2014    Procedure: CARDIOVERSION;  Surgeon: Skeet Latch, MD;  Location: [facility];  Service: Cardiovascular;  Laterality: N/A;     Current  Outpatient Prescriptions  Medication Sig Dispense Refill  . acetaminophen-codeine (TYLENOL #3) 300-30 MG per tablet Take 1-2 tablets by mouth every 6 (six) hours as needed for moderate pain. 15 tablet 0  . apixaban (ELIQUIS) 5 MG TABS tablet Take 1 tablet (5 mg total) by mouth 2 (two) times daily. 60 tablet 5  . B Complex-C (SUPER B COMPLEX PO) Take 1 tablet by mouth daily.    . carvedilol (COREG) 6.25 MG tablet Take 1 tablet (6.25 mg total) by mouth 2 (two) times daily. 180 tablet 3  . dofetilide (TIKOSYN) 125 MCG capsule Take 125 mcg by mouth 2 (two) times daily.     Marland Kitchen ESTRACE VAGINAL 0.1 MG/GM vaginal cream  Place 1 Applicatorful vaginally every Monday, Wednesday, and Friday.     . furosemide (LASIX) 20 MG tablet Take one tablet (20 mg) by mouth ever other day on Monday, Wednesday, Friday 90 tablet 1  . magnesium oxide (MAG-OX) 400 (241.3 MG) MG tablet Take 1 tablet (400 mg total) by mouth 2 (two) times daily. (Patient taking differently: Take 400 mg by mouth every morning. ) 60 tablet 0  . Multiple Vitamin (MULTIVITAMIN PO) Take 1 tablet by mouth daily.     . nitroGLYCERIN (NITROSTAT) 0.4 MG SL tablet Place 1 tablet (0.4 mg total) under the tongue every 5 (five) minutes as needed for chest pain. 25 tablet 3  . potassium chloride SA (KLOR-CON M20) 20 MEQ tablet Take 1 tablet (20 mEq total) by mouth daily. 90 tablet 3  . diltiazem (CARDIZEM CD) 180 MG 24 hr capsule Take 1 capsule (180 mg total) by mouth daily. (Patient not taking: Reported on 01/11/2015) 90 capsule 3   No current facility-administered medications for this visit.    Allergies:   Tagamet    Social History:  The patient  reports that she has never smoked. She has never used smokeless tobacco. She reports that she drinks alcohol. She reports that she does not use illicit drugs.   Family History:  The patient's family history includes Aneurysm in her father; Cancer (age of onset: 23) in her sister; Diabetes in her father and mother; Heart attack in her father; Hypertension in her father and mother; Stroke (age of onset: 50) in her mother.    ROS:  Please see the history of present illness.   Otherwise, review of systems are positive for  confusion concerning medications.   All other systems are reviewed and negative.    PHYSICAL EXAM: VS:  BP 158/50 mmHg  Pulse 66  Ht 5\' 1"  (1.549 m)  Wt 102 lb 6.4 oz (46.448 kg)  BMI 19.36 kg/m2 , BMI Body mass index is 19.36 kg/(m^2). slender and frail. Malnourished appearing.  GEN: Well nourished, well developed, in no acute distress HEENT: normal Neck: no JVD, carotid bruits, or  masses Cardiac:  slightly irregularere  is no murmur, rub, or gallop. There is  No edema. Respiratory:  clear to auscultation bilaterally, normal work of breathing. GI: soft, nontender, nondistended, + BS MS: no deformity or atrophy Skin: warm and dry, no rash Neuro:  Strength and sensation are intact Psych: euthymic mood, full affect   EKG:  EKG is ordered today. The ekg reveals  sinus bradycardia with diffuse T-wave flattening atrial abnormality and premature atrial contractions.   Recent Labs: 07/25/2014: ALT 48 11/19/2014: B Natriuretic Peptide 1196.4*; TSH 0.443 11/29/2014: Magnesium 1.9 12/06/2014: BUN 26*; Creatinine, Ser 1.04*; Hemoglobin 13.0; Platelets 204; Potassium 4.4; Sodium 139    Lipid Panel  No results found for: CHOL, TRIG, HDL, CHOLHDL, VLDL, LDLCALC, LDLDIRECT    Wt Readings from Last 3 Encounters:  01/11/15 102 lb 6.4 oz (46.448 kg)  12/14/14 102 lb 6.4 oz (46.448 kg)  12/04/14 97 lb 6.4 oz (44.18 kg)      Other studies Reviewed: Additional studies/ records that were reviewed today includes review of recent EP notes. The findings include  please see the dialogue above concerning medications as they have been taken.    ASSESSMENT AND PLAN:  1. PAF (paroxysmal atrial fibrillation) (HCC) Now back in sinus rhythm.   2. CKD (chronic kidney disease), stage II Current function is unknown   3. Essential hypertension Initially mildly elevated at 158/50 mmHg decreasing to 140/60 mmHg   4. RAS (renal artery stenosis) ([place]) Not recently evaluated   5. Tikosyn therapy Patient is been continuously on his therapy since it was started. Management of therapy has been assumed by the stepdaughter who feels her pillbox weekly. Pill counts are being done  Current medicines are reviewed at length with the patient today.  The patient has the following concerns regarding medicines: infusion which has now been straightened out by the stepdaughter who started placing  the medications and a medication weekly pillbox and taken as prescribed..  The following changes/actions have been instituted:  Continue Tikosyn 375 g twice a day   Discontinue diltiazem  Renal profile today  Discontinue diltiazem  Continue magnesium oxide  Continue pillbox organization of medication dispense  Labs/ tests ordered today include:  No orders of the defined types were placed in this encounter.     Disposition:   FU with HS in 6 weeks  Signed, Sinclair Grooms, MD  01/11/2015 10:09 AM    [address], [place], [place]  [address] Phone: [phone]; Fax: [phone]

## 2015-01-11 ENCOUNTER — Ambulatory Visit (INDEPENDENT_AMBULATORY_CARE_PROVIDER_SITE_OTHER): Payer: Medicare Other | Admitting: Interventional Cardiology

## 2015-01-11 ENCOUNTER — Encounter: Payer: Self-pay | Admitting: Interventional Cardiology

## 2015-01-11 VITALS — BP 158/50 | HR 66 | Ht 61.0 in | Wt 102.4 lb

## 2015-01-11 DIAGNOSIS — N182 Chronic kidney disease, stage 2 (mild): Secondary | ICD-10-CM | POA: Diagnosis not present

## 2015-01-11 DIAGNOSIS — I10 Essential (primary) hypertension: Secondary | ICD-10-CM | POA: Diagnosis not present

## 2015-01-11 DIAGNOSIS — I480 Paroxysmal atrial fibrillation: Secondary | ICD-10-CM

## 2015-01-11 DIAGNOSIS — I4891 Unspecified atrial fibrillation: Secondary | ICD-10-CM | POA: Diagnosis not present

## 2015-01-11 DIAGNOSIS — I701 Atherosclerosis of renal artery: Secondary | ICD-10-CM | POA: Diagnosis not present

## 2015-01-11 LAB — RENAL FUNCTION PANEL
Albumin: 3.8 g/dL (ref 3.6–5.1)
BUN: 22 mg/dL (ref 7–25)
CHLORIDE: 105 mmol/L (ref 98–110)
CO2: 27 mmol/L (ref 20–31)
Calcium: 10.1 mg/dL (ref 8.6–10.4)
Creat: 0.93 mg/dL (ref 0.60–0.93)
Glucose, Bld: 118 mg/dL — ABNORMAL HIGH (ref 65–99)
POTASSIUM: 4 mmol/L (ref 3.5–5.3)
Phosphorus: 3.4 mg/dL (ref 2.1–4.3)
SODIUM: 141 mmol/L (ref 135–146)

## 2015-01-11 MED ORDER — MAGNESIUM OXIDE 400 (241.3 MG) MG PO TABS: 400.0000 mg | ORAL_TABLET | ORAL | Status: DC

## 2015-01-11 MED ORDER — DOFETILIDE 125 MCG PO CAPS: 375.0000 ug | ORAL_CAPSULE | Freq: Two times a day (BID) | ORAL | Status: DC

## 2015-01-11 NOTE — Patient Instructions (Signed)
Medication Instructions:  Your physician recommends that you continue on your current medications as directed. Please refer to the Current Medication list given to you today.   An Rx for Tikosyn and Magnesium has been sent to your pharmacy  Labwork: Renal Profile today  Testing/Procedures: None orderd  Follow-Up: You have a follow up appointment on 02/23/15 @ 10:45am  Any Other Special Instructions Will Be Listed Below (If Applicable).     If you need a refill on your cardiac medications before your next appointment, please call your pharmacy.

## 2015-01-15 DIAGNOSIS — R413 Other amnesia: Secondary | ICD-10-CM | POA: Diagnosis not present

## 2015-01-15 DIAGNOSIS — I4891 Unspecified atrial fibrillation: Secondary | ICD-10-CM | POA: Diagnosis not present

## 2015-01-16 ENCOUNTER — Telehealth: Payer: Self-pay

## 2015-01-16 NOTE — Telephone Encounter (Signed)
Pt aware of lab results with verbal understanding. 

## 2015-01-16 NOTE — Telephone Encounter (Signed)
-----   Message from Belva Crome, MD sent at 01/15/2015  9:39 AM EST ----- Normal

## 2015-01-18 ENCOUNTER — Ambulatory Visit
Admission: RE | Admit: 2015-01-18 | Discharge: 2015-01-18 | Disposition: A | Discharge status: Home / Self Care | Payer: Commercial Managed Care - HMO | Source: Ambulatory Visit | Attending: Internal Medicine | Admitting: Internal Medicine

## 2015-01-18 ENCOUNTER — Other Ambulatory Visit: Payer: Self-pay | Admitting: Internal Medicine

## 2015-01-18 DIAGNOSIS — R6889 Other general symptoms and signs: Secondary | ICD-10-CM | POA: Diagnosis not present

## 2015-01-18 DIAGNOSIS — R634 Abnormal weight loss: Secondary | ICD-10-CM | POA: Diagnosis not present

## 2015-01-18 DIAGNOSIS — F341 Dysthymic disorder: Secondary | ICD-10-CM | POA: Diagnosis not present

## 2015-01-18 DIAGNOSIS — R630 Anorexia: Secondary | ICD-10-CM | POA: Diagnosis not present

## 2015-01-18 DIAGNOSIS — R7309 Other abnormal glucose: Secondary | ICD-10-CM | POA: Diagnosis not present

## 2015-01-18 DIAGNOSIS — R7303 Prediabetes: Secondary | ICD-10-CM | POA: Diagnosis not present

## 2015-01-18 DIAGNOSIS — D649 Anemia, unspecified: Secondary | ICD-10-CM | POA: Diagnosis not present

## 2015-01-21 DIAGNOSIS — F4323 Adjustment disorder with mixed anxiety and depressed mood: Secondary | ICD-10-CM | POA: Diagnosis not present

## 2015-01-30 DIAGNOSIS — R7309 Other abnormal glucose: Secondary | ICD-10-CM | POA: Diagnosis not present

## 2015-01-30 DIAGNOSIS — R413 Other amnesia: Secondary | ICD-10-CM | POA: Diagnosis not present

## 2015-01-30 DIAGNOSIS — R630 Anorexia: Secondary | ICD-10-CM | POA: Diagnosis not present

## 2015-01-30 DIAGNOSIS — R634 Abnormal weight loss: Secondary | ICD-10-CM | POA: Diagnosis not present

## 2015-01-30 DIAGNOSIS — I4891 Unspecified atrial fibrillation: Secondary | ICD-10-CM | POA: Diagnosis not present

## 2015-01-30 DIAGNOSIS — F341 Dysthymic disorder: Secondary | ICD-10-CM | POA: Diagnosis not present

## 2015-02-01 ENCOUNTER — Other Ambulatory Visit: Payer: Self-pay | Admitting: Obstetrics and Gynecology

## 2015-02-01 ENCOUNTER — Other Ambulatory Visit: Payer: Self-pay

## 2015-02-01 DIAGNOSIS — N63 Unspecified lump in unspecified breast: Secondary | ICD-10-CM

## 2015-02-01 DIAGNOSIS — I5030 Unspecified diastolic (congestive) heart failure: Secondary | ICD-10-CM

## 2015-02-01 DIAGNOSIS — N898 Other specified noninflammatory disorders of vagina: Secondary | ICD-10-CM | POA: Diagnosis not present

## 2015-02-01 DIAGNOSIS — N644 Mastodynia: Secondary | ICD-10-CM | POA: Diagnosis not present

## 2015-02-01 DIAGNOSIS — N6320 Unspecified lump in the left breast, unspecified quadrant: Secondary | ICD-10-CM

## 2015-02-01 DIAGNOSIS — R634 Abnormal weight loss: Secondary | ICD-10-CM | POA: Diagnosis not present

## 2015-02-01 DIAGNOSIS — R413 Other amnesia: Secondary | ICD-10-CM | POA: Diagnosis not present

## 2015-02-01 NOTE — Patient Outreach (Signed)
[facility]) Care Management  02/01/2015  Donna [name] July 30, 1935 [id number]   Referral Date: 01/31/2015 Referral Source: Dr. Leo Grosser, GYN Referral Issue: Patient with significant decline in health over the past year. Patient still driving despite declining health and memory. Patient is caregiver to her husband whom is not in good health. H/o 15 year patient of Dr. Caralee Ates.  MD has noted weight loss of 12 lbs over one month.  Current weight 106.  MD would like to know what proactive measures are being sought to manage patients memory changes.  Plan: Dr. Leo Grosser will introduce [facility] CM at appointment on 02/01/2015 at 0930 with request for same day phone call from [place].   Providers: Primary MD:   Dr. Inda Merlin  -  last appt: 01/31/15    GYN:  Dr. Leo Grosser - last appt 02/01/15 Cardiologist:  Dr. Pernell Dupre HH: none  Insurance: [employer]   Social: 80 yo patient living in her home with her husband and adult daughter, Donna [name].  States she lives on [place] acres and loves working in her yard with her dogs.   Patient states the only stress she has is relating to one of her daughters, Donna [name] who is greedy and never happy with all the things she has provided her with through the years.  States she believes she is the reason for all the current issues of conflict with the concerns about her health.  States Donna [name] has told the doctor things that are not true.  States she has not shared the conflicts with daughter, Donna [name] with Dr. Leo Grosser.  States Donna [name] lives in [place]; her husband is in [place] and has 3 children, Donna [name], Donna [name].  States she has had to ask her daughter to leave the home in the past due to being disrespectful to her current husband, Donna [name] who is not her daughters father but who she has been married to for 78 years.  Patient confirms that daughter, Donna [name] living in the home has not discussed any noted health concerns with patient.  States she will call her from work sometimes to  remind her of something but that is no big deal.   Patient has good awareness that she is not able to recall certain information freely such as name of long time Cardiologist, last appt with Primary MD which was yesterday or how long she has been married.  Patient quickly consulted her husband on any questions she was not able to answer quickly.  RN CM had to redirect conversation on several occasions in order to complete assessment.  Patient would quickly return back to the issues surrounding her daughter, Donna [name].  Patient believes her daughter to be jealous, greedy and is causing her problems which increases her stress and ability to refrain from not dwell on the issue.  Mobility: active in yard, house, [employer], water aerobic classes 4 days a week, walking.  States she is always busy doing something.   Falls: none Pain: none Transportation: states she is still driving and has no problems.  Denies ever getting lost while driving.   Caregiver: Husband, Donna [name] and daughter, Donna [name].  HIPAA:  Patient would not give permission this call to discuss health with daughter, Donna [name].     DME: scales, BP cuff  [facility] conditions:  Diastolic CHF Other:   Kidney Disease, HTN, A-Fib, memory disturbance Admissions: 1 ER visits: 6 Weight 102 and BP 142/82 on 01/30/15 MD appt per [facility] MR  Weight Loss 102 lbs on  01/30/15 MD appt H/o weight  115 lb per 08/24/14 Epic H&P Note.  (Weight down 13 lbs over 5 months). Patient states she eats very healthy, avoids salt, pork and eats lots of vegetables and lean meat.  Drinks water.   States awareness that MD is concerned with weight loss but states she has always been very 'skinny'.  States she stays active all the time with the yard, house, dogs, [employer], water exercise class.  States Dr. Inda Merlin would like her to gain 10 lbs and ordered a Nutritionist Referral on 01/31/2015.   Medications:  Patient taking less than 10 medications  Co-pay cost issue: none Flu Vaccine:  10/18/14 Pneumonia Vaccine: 09/06/2013  Preventives:  Mammogram 08/2014 - H/o Dr. Leo Grosser has ordered new mammogram for 01/2015  Consent: Patient agreed to [facility] services and welcomed anything [facility] would like to do to work on her health.     Plan:  [place] RN CM Referral  -Please see Screening Assessment completed 02/01/2015.  -Please assess again on home visit to see if patient is able to provide same details as described this call.  Dr. Leo Grosser would like to know that primary caregiver is addressing memory issues and safety concerns with patient driving.    [place] Referral -please assess if any medications patient is taking could affect memory as a side effect.  Please see Screening assessment completed on 02/01/2015  RN CM provided patient education on importance of stress management, BP management and to assess what may be contributing to memory changes and weight loss.    RN CM to monitor Nutritional Referral outcomes.   RN CM notified [place] Management Assistant: agreed to services/case opened. RN CM sent successful outreach letter and  [facility] Introductory package. RN CM sent Physician's (Dr. Inda Merlin and Dr. Leo Grosser) - Screening updates:  Via routing document via Epic  RN CM advised in next contact call within 2 weeks.   RN CM advised to please notify MD of any changes in condition prior to scheduled appt's.   RN CM provided contact name and # [phone] or main office # [phone] and 24-hour nurse line # [phone].  RN CM confirmed patient is aware of 911 services for urgent emergency needs.  Mariann Laster, RN, BSN, [facility], CCM  [employer] Management Coordinator [phone] Direct [phone] Cell [phone] Office [phone] Fax

## 2015-02-01 NOTE — Patient Outreach (Signed)
[facility]) Care Management  02/01/2015  Donna [name] 12/06/1935 [id number]   Care Coordination Services  Outreach call to Dr. Caralee Ates office.   RN CM verified  -patient kept appt with Dr. Leo Grosser this morning.  -phone # on file correct:  [phone] -please notify MD of unsuccessful outreach today and [facility] RN CM will continue with outreach calls.   Mariann Laster, RN, BSN, [facility], CCM  [employer] Management Coordinator [phone] Direct [phone] Cell [phone] Office [phone] Fax

## 2015-02-01 NOTE — Patient Outreach (Signed)
[facility]) Care Management  02/01/2015  Donna [name] 03/05/1935 [id number]  Referral Date:  01/31/2015 Referral Source:  Dr. Leo Grosser Referral Issue:  Patient with significant decline in health over the past year.  Patient still driving despite declining health and memory.  Patient is caregiver to her husband whom is not in good health.  Plan:  [facility] at appointment on 02/01/2015 at 0930 with request for same day phone call from [place].  Primary MD:  Dr. Inda Merlin   Outreach call # 1 to patient; not reached.   Outgoing message stating You are the grand prize winner ... RN CM left HIPAA compliant voice message with name and number.   RN CM attempted additional outreach call in the event wrong # dialed but same message.   Plan:   RN CM will contact Dr. Caralee Ates office for phone # verification. RN CM will scheduled for next contact call with 24-48 hours.     Mariann Laster, RN, BSN, [facility], CCM  [employer] Management Coordinator [phone] Direct [phone] Cell [phone] Office [phone] Fax

## 2015-02-02 ENCOUNTER — Ambulatory Visit: Payer: Commercial Managed Care - HMO

## 2015-02-05 ENCOUNTER — Other Ambulatory Visit: Payer: Self-pay

## 2015-02-05 NOTE — Patient Outreach (Signed)
[facility]) Care Management  02/05/2015  PHYLLICIA SZE [birth year] [id number]  Care Coordination-Call to arrange home visit. Voicemail picked up.RNCM left HIPPA compliant voice message.  Plan: await return call and follow up call within 1-2 days to arrange home visit.  Thea Silversmith, RN, MSN, [place] Coordinator Cell: [phone]

## 2015-02-06 ENCOUNTER — Other Ambulatory Visit: Payer: Commercial Managed Care - HMO

## 2015-02-07 ENCOUNTER — Other Ambulatory Visit: Payer: Self-pay

## 2015-02-07 NOTE — Patient Outreach (Signed)
[facility]) Care Management  02/07/2015  Donna [name] [birth year] [id number]  Referral received per provider. RNCM called to schedule home visit to complete assessment. No answer. HIPPA compliant message left. 2nd attempt.  Plan: await return call and follow up next week if no return call.  Thea Silversmith, RN, MSN, [place] Coordinator Cell: [phone]

## 2015-02-08 ENCOUNTER — Other Ambulatory Visit: Payer: Self-pay

## 2015-02-08 NOTE — Patient Outreach (Signed)
[facility]) Care Management  02/08/2015  Donna [name] 31-Mar-1935 [id number]  Referral received per telephonic care coordinator. RNCM  reinforced education regarding [place] Management services. Member is agreeable for RNCM to make home visit once she was informed which doctor made referral to care management services. Member request RNCM call prior to coming stating that "cause I a busy bee".  Plan: Home visit scheduled for next week for assessment.  Thea Silversmith, RN, MSN, [place] Coordinator Cell: [phone]

## 2015-02-13 ENCOUNTER — Encounter: Payer: Medicare Other | Attending: Internal Medicine | Admitting: Dietician

## 2015-02-13 VITALS — Ht 62.0 in | Wt 104.0 lb

## 2015-02-13 DIAGNOSIS — R634 Abnormal weight loss: Secondary | ICD-10-CM | POA: Diagnosis not present

## 2015-02-13 NOTE — Progress Notes (Signed)
  Medical Nutrition Therapy:  Appt start time: 1600 end time:  [id number].   Assessment:  Primary concerns today: Patient is here today with her husband.  She has been losing weight for the last 18 months.   UBW 132 lbs at one time and lost to 102 last week.  She also has prediabetes, afib, memory disturbances, dysthymia, GERD and renal artery stenosis, CHF.  "I don't want to gain weight and get diabetes."  "I've always tried to watch my weight."  Which patient stated multiple times during this visit.  Referral is for weight loss and husband states that MD wants her to gain 10 lbs.    Patient lives with her husband.  Husband shops and cooks.  Nutrition focused physical exam completed.  They state that they live alone but review of medical record indicates that she lives with her adult daughter [name].  Chart indicates that she is still driving.  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  Noted decreased body fat and mild decrease of muscle mass at temple, interosseous muscle and patellar region.  Memory issues of patient noted in patient and also mildly in husband.  Husband dropped patient off at the [facility].  She walked to an [place] practice in this building before her arrival here.   She reported being very busy around her home and yard.  Husband states that he knows how to cook but seems to be challenged at times with her care.    Preferred Learning Style:   No preference indicated   Learning Readiness:   Ready  Change in progress   MEDICATIONS: see list to include Remeron which was recently added.   DIETARY INTAKE:  Usual eating pattern includes 2 meals and 0 snacks per day, plus 2 Boost per day for the past 6 weeks.  Everyday foods include "love" salad.  Avoided foods include sweets.    24-hr recall:  B (8-9 AM): 2 eggs cooked with olive oil, 2 servings fresh fruit, toast with cheddar cheese Snk ( AM): Boost L ( PM): often skips Snk  ( PM): none D ( PM): [place] sandwich with lettuce, tomato, mayo, salad with vinaigrette or broccoli OR [place] (salmon, potato, asparagus)    Snk ( PM): Boost Beverages: water, Boost  Usual physical activity: has not been to the [facility] for the past 2 years.  Very active in her yard and house.  Estimated energy needs: 1800 calories 60 g protein  Progress Towards Goal(s):  In progress.   Nutritional Diagnosis:  [place]-3.2 Unintentional weight loss As related to skipping meals and restrictive choices as well as declining memory..  As evidenced by diet hx.    Intervention:  Nutrition education/counseling on high calorie diet to gain weight.  Provided patient with 2 Vanilla Boost Plus samples.  Avoid skipping meals. Breakfast, Lunch, Dinner daily. Try to have a hot meal for lunch or dinner. Boost two times per day or [employer]entials or Ensure 1-2 snacks every day. Increase calories when cooking.  See handout.  Teaching Method Utilized:  Auditory  Handouts given during visit include:  Underweight Nutrition Therapy from AND  Barriers to learning/adherence to lifestyle change: none  Demonstrated degree of understanding via:  Teach Back   Monitoring/Evaluation:  Dietary intake, exercise, and body weight prn.

## 2015-02-13 NOTE — Patient Instructions (Signed)
Avoid skipping meals. Breakfast, Lunch, Dinner daily. Try to have a hot meal for lunch or dinner. Boost two times per day or [employer] Essentials or Ensure 1-2 snacks every day. Increase calories when cooking.  See handout.

## 2015-02-15 ENCOUNTER — Other Ambulatory Visit: Payer: Self-pay

## 2015-02-15 NOTE — Patient Outreach (Signed)
[facility] [facility]) Care Management  [place]  02/15/2015   Donna [name] 1935-06-29 [id number]  Subjective: Member in good spirits expressing how much she loves to work out in the yard. States she has lost weight, stating she has an appetite, but she gets busy working in the yard. Member denies any memory issues.  Objective: BP 154/78 mmHg  Pulse 58  Resp 20  Ht 1.575 m (5\' 2" )  Wt 108 lb (48.988 kg)  BMI 19.75 kg/m2  SpO2 97% Lungs clear, heart rate slightly irregular  Current Medications:  Current Outpatient Prescriptions  Medication Sig Dispense Refill  . acetaminophen-codeine (TYLENOL #3) 300-30 MG per tablet Take 1-2 tablets by mouth every 6 (six) hours as needed for moderate pain. 15 tablet 0  . apixaban (ELIQUIS) 5 MG TABS tablet Take 1 tablet (5 mg total) by mouth 2 (two) times daily. 60 tablet 5  . B Complex-C (SUPER B COMPLEX PO) Take 1 tablet by mouth daily.    . carvedilol (COREG) 6.25 MG tablet Take 1 tablet (6.25 mg total) by mouth 2 (two) times daily. 180 tablet 3  . dofetilide (TIKOSYN) 125 MCG capsule Take 3 capsules (375 mcg total) by mouth 2 (two) times daily. 90 capsule 11  . ESTRACE VAGINAL 0.1 MG/GM vaginal cream Place 1 Applicatorful vaginally every Monday, Wednesday, and Friday.     . furosemide (LASIX) 20 MG tablet Take one tablet (20 mg) by mouth ever other day on Monday, Wednesday, Friday 90 tablet 1  . magnesium oxide (MAG-OX) 400 (241.3 Mg) MG tablet Take 1 tablet (400 mg total) by mouth every morning. 30 tablet 11  . mirtazapine (REMERON) 15 MG tablet Take 15 mg by mouth at bedtime.    . Multiple Vitamin (MULTIVITAMIN PO) Take 1 tablet by mouth daily.     . nitroGLYCERIN (NITROSTAT) 0.4 MG SL tablet Place 1 tablet (0.4 mg total) under the tongue every 5 (five) minutes as needed for chest pain. 25 tablet 3  . potassium chloride SA (KLOR-CON M20) 20 MEQ tablet Take 1 tablet (20 mEq total) by mouth daily. 90 tablet 3   No current  facility-administered medications for this visit.    Functional Status:  In your present state of health, do you have any difficulty performing the following activities: 02/15/2015 02/01/2015  Hearing? N N  Vision? N N  Difficulty concentrating or making decisions? N Y  Walking or climbing stairs? N N  Dressing or bathing? N N  Doing errands, shopping? N N  Preparing Food and eating ? N N  Using the Toilet? N N  In the past six months, have you accidently leaked urine? N N  Do you have problems with loss of bowel control? N N  Managing your Medications? N N  Managing your Finances? N N  Housekeeping or managing your Housekeeping? N N    Fall/Depression Screening: PHQ 2/9 Scores 02/15/2015 02/13/2015 02/01/2015  PHQ - 2 Score 0 0 0   Fall Risk  02/15/2015 02/13/2015 02/01/2015  Falls in the past year? No No No   Assessment: 80 year old referral received from Dr. Crosby Oyster completed home visit. Member denies forgetfulness or memory issues. Husband acknowledged member has memory issues, but pulled RNCM aside to express that member does have memory issues. RNCM talked discussed referring doctor's concerns and [name] states, "it is being taking care of". Mr. Hegland states that he and his daughter assist member in medication management/fill member's medication box. Member reports  that husband assist with preparing meals. Member is able to complete activity of daily living.  History of atrial fibrillation-member and [name] report they would like more education on atrial fibrillation. EMMI educational material prescribed.  Medications reviewed: RNCM discussed purpose for medications. RNCM reinforced the importance of taking medications as ordered.  Weight loss. Member has been to nutritionist. [facility] reinforced instructions provided by nutritionist. Member states she would like to gain some weight.   RNCM provided contact information. RNCM provided 24 hour nurse advice line contact  number.  Plan: home visit next month  [facility] CM Care Plan Problem One        Most Recent Value   Care Plan Problem One  knowledge deficit related to atrial fibrillation   Role Documenting the Problem One  Care Management [place] for Problem One  Active   THN Long Term Goal (31-90 days)  member/caregiver will verbalize basic understanding of  disease process and how to manage within the next 31-60 days.   THN Long Term Goal Start Date  02/15/15   Interventions for Problem One Long Term Goal  assigned EMMI educational material, discussed medications member was taking to help manage atrial fibrillation.   THN CM Short Term Goal #1 (0-30 days)  member/caregiver will review EMMI material and prepare questions within the next 30 days.   THN CM Short Term Goal #1 Start Date  02/15/15   Interventions for Short Term Goal #1  assigned EMMI material    [facility] CM Care Plan Problem Two        Most Recent Value   Care Plan Problem Two  weight loss   Role Documenting the Problem Two  Care Management Coordinator   Care Plan for Problem Two  Active   THN CM Short Term Goal #1 (0-30 days)  member will monitor weight at least weekly within the next 30 days.   THN CM Short Term Goal #1 Start Date  02/15/15   Interventions for Short Term Goal #2   discussed importance of monitoring weights.   THN CM Short Term Goal #2 (0-30 days)  member will drink 2 nutritional supplements daily within the next month   THN CM Short Term Goal #2 Start Date  02/15/15   Interventions for Short Term Goal #2  reinforced to drink between meals.   THN CM Short Term Goal #3 (0-30 days)  member will eat at least 1-2 hot meals/day within the next 30 days   THN CM Short Term Goal #3 Start Date  02/15/15   Interventions for Short Term Goal #3  reinforced instructions per Nutritionist. Encouraged member to eat 1-2 hot meals/day.     Thea Silversmith, RN, MSN, [place] Coordinator Cell: [phone]

## 2015-02-19 ENCOUNTER — Other Ambulatory Visit: Payer: Self-pay

## 2015-02-19 ENCOUNTER — Other Ambulatory Visit: Payer: Self-pay | Admitting: Pharmacist

## 2015-02-19 DIAGNOSIS — N898 Other specified noninflammatory disorders of vagina: Secondary | ICD-10-CM | POA: Diagnosis not present

## 2015-02-19 NOTE — Patient Outreach (Signed)
[place] [facility]) Care Management  02/19/2015  MIO PLACIDE Jan 24, 1935 [id number]  Care Coordination Services  Inbound call from Dr. Leo Grosser.   Issue:  States patient has contacted her office requesting appt for today for the same issues of recent appt's over the past several months.  MD requesting nurse contact patient and assess if issues require seeing MD today.  MD states full schedule but will work patient in if RN CM findings confirm need to see MD today.   States goal of THN involvement to work with patient on self management and avoiding inappropriate MD office appt utilization.  MD states patient has a plan in place for use of vaginal Estrogen cream.   RN CM please contact Dr. Leo Grosser at cell # [phone] today to provide update on need for appt today.  RN CM notified assigned [facility] Community RN CM, [place], [place] M of this update.   Mariann Laster, RN, BSN, [facility], CCM  [employer] Management Coordinator [phone] Direct [phone] Cell [phone] Office [phone] Fax

## 2015-02-19 NOTE — Patient Outreach (Signed)
[place] [facility]) Care Management  02/19/2015  Donna [name] 11-Jun-1935 [id number]  RNCM received message from telephonic care coordinator, Mariann Laster, who received a call from Dr. Kendall Flack who is requesting case management call member to assess if an appointment is needed regarding vaginal Estrogen cream. RNCM called member. No answer. HIPPA compliant message left. RNCM called Dr. Leo Grosser to notify that member was unable to be reached.   Plan: Await return call and follow up call within 1-2 days if no return call.  Thea Silversmith, RN, MSN, [place] Coordinator Cell: [phone]

## 2015-02-19 NOTE — Patient Outreach (Addendum)
[place] [facility]) Care Management  [place]   02/19/2015  DIONTE PREECE [birth year] [id number]  Subjective: Donna [name] is a 80yo who was referred to [place] from [place] for medication review.  Per referral, please assess if any of patient's medications could be affecting memory as a side effect.  Objective:   Current Medications: Current Outpatient Prescriptions  Medication Sig Dispense Refill  . acetaminophen-codeine (TYLENOL #3) 300-30 MG per tablet Take 1-2 tablets by mouth every 6 (six) hours as needed for moderate pain. 15 tablet 0  . apixaban (ELIQUIS) 5 MG TABS tablet Take 1 tablet (5 mg total) by mouth 2 (two) times daily. 60 tablet 5  . B Complex-C (SUPER B COMPLEX PO) Take 1 tablet by mouth daily.    . carvedilol (COREG) 6.25 MG tablet Take 1 tablet (6.25 mg total) by mouth 2 (two) times daily. 180 tablet 3  . dofetilide (TIKOSYN) 125 MCG capsule Take 3 capsules (375 mcg total) by mouth 2 (two) times daily. 90 capsule 11  . ESTRACE VAGINAL 0.1 MG/GM vaginal cream Place 1 Applicatorful vaginally every Monday, Wednesday, and Friday.     . furosemide (LASIX) 20 MG tablet Take one tablet (20 mg) by mouth ever other day on Monday, Wednesday, Friday 90 tablet 1  . magnesium oxide (MAG-OX) 400 (241.3 Mg) MG tablet Take 1 tablet (400 mg total) by mouth every morning. 30 tablet 11  . mirtazapine (REMERON) 15 MG tablet Take 15 mg by mouth at bedtime.    . Multiple Vitamin (MULTIVITAMIN PO) Take 1 tablet by mouth daily.     . nitroGLYCERIN (NITROSTAT) 0.4 MG SL tablet Place 1 tablet (0.4 mg total) under the tongue every 5 (five) minutes as needed for chest pain. 25 tablet 3  . potassium chloride SA (KLOR-CON M20) 20 MEQ tablet Take 1 tablet (20 mEq total) by mouth daily. 90 tablet 3   No current facility-administered medications for this visit.    Functional Status: In your present state of health, do you have any difficulty  performing the following activities: 02/15/2015 02/01/2015  Hearing? N N  Vision? N N  Difficulty concentrating or making decisions? N Y  Walking or climbing stairs? N N  Dressing or bathing? N N  Doing errands, shopping? N N  Preparing Food and eating ? N N  Using the Toilet? N N  In the past six months, have you accidently leaked urine? N N  Do you have problems with loss of bowel control? N N  Managing your Medications? N N  Managing your Finances? N N  Housekeeping or managing your Housekeeping? N N    Fall/Depression Screening: PHQ 2/9 Scores 02/15/2015 02/13/2015 02/01/2015  PHQ - 2 Score 0 0 0    Assessment: 1.  Medication review:   Drugs sorted by system:  Neurologic/Psychologic: mirtazapine  Cardiovascular: apixaban, carvedilol, dofetilide, furosemide, nitroglycerin SL, potassium  Pulmonary/Allergy: none  Gastrointestinal: none  Endocrine: none  Renal: none  Topical: estradiol cream  Pain: acetaminophen-codeine  Vitamins/Minerals: b complex-c, magnesium oxide, multivitamin  Infectious Diseases: none  Miscellaneous: none   Duplications in therapy: none noted Gaps in therapy: none noted  Medications to avoid in the elderly: none noted  Drug interactions: none noted Other issues noted:  Memory issues - codeine may cause CNS depression which may impair physical or mental abilities and mirtazapine may cause confusion (2%) or amnesia which may worsen patient's memory issues.     Plan: 1.  Medication review:  Codeine and/or mirtazapine may contribute to patient's memory issues.  Will send a fax to patient's PCP with this information.  No other issues noted.  Will close pharmacy program.  Will update [facility] CMRN Thea Silversmith.    Elisabeth Most, Pharm.D. Pharmacy Resident [place] [phone]

## 2015-02-20 ENCOUNTER — Other Ambulatory Visit: Payer: Self-pay

## 2015-02-20 NOTE — Patient Outreach (Signed)
[place] [facility]) Care Management  02/20/2015  Donna [name] June 09, 1935 [id number]  Follow up call- RNCM spoke with member who reports that she went to see a nurse practitioner at Dr. Inda Merlin office on yesterday. No medication changes. RNCM spoke with member's husband who reports that member was having an issue with vaginal odor and states member has an appointment scheduled for tomorrow with gynecologist.  RNCM discussed Dr. Caralee Ates concern about member's memory/safety/driving concerns . Donna [name] states he and his daughter are filling member's medication box. Also reports that he is driving member to places. Per Donna [name], member has not been seen by primary care regarding her memory. He is agreeable to [facility] calling to schedule an appointment with primary care.  Plan: RNCM called primary care-appointment scheduled for Monday February 20th at 1:30. RNCM called and informed Donna [name] of date/time of appointment. RNCM will follow up with home visit next week.  Thea Silversmith, RN, MSN, [place] Coordinator Cell: [phone]

## 2015-02-21 DIAGNOSIS — N898 Other specified noninflammatory disorders of vagina: Secondary | ICD-10-CM | POA: Diagnosis not present

## 2015-02-21 DIAGNOSIS — R4189 Other symptoms and signs involving cognitive functions and awareness: Secondary | ICD-10-CM | POA: Diagnosis not present

## 2015-02-21 DIAGNOSIS — N952 Postmenopausal atrophic vaginitis: Secondary | ICD-10-CM | POA: Diagnosis not present

## 2015-02-22 DIAGNOSIS — Z7901 Long term (current) use of anticoagulants: Secondary | ICD-10-CM | POA: Insufficient documentation

## 2015-02-23 ENCOUNTER — Encounter: Payer: Self-pay | Admitting: Interventional Cardiology

## 2015-02-23 ENCOUNTER — Ambulatory Visit (INDEPENDENT_AMBULATORY_CARE_PROVIDER_SITE_OTHER): Payer: Medicare Other | Admitting: Interventional Cardiology

## 2015-02-23 VITALS — BP 180/92 | HR 71 | Ht 62.0 in | Wt 106.8 lb

## 2015-02-23 DIAGNOSIS — Z7901 Long term (current) use of anticoagulants: Secondary | ICD-10-CM

## 2015-02-23 DIAGNOSIS — I480 Paroxysmal atrial fibrillation: Secondary | ICD-10-CM | POA: Diagnosis not present

## 2015-02-23 DIAGNOSIS — I150 Renovascular hypertension: Secondary | ICD-10-CM | POA: Diagnosis not present

## 2015-02-23 DIAGNOSIS — N182 Chronic kidney disease, stage 2 (mild): Secondary | ICD-10-CM | POA: Diagnosis not present

## 2015-02-23 DIAGNOSIS — Z5181 Encounter for therapeutic drug level monitoring: Secondary | ICD-10-CM

## 2015-02-23 DIAGNOSIS — Z79899 Other long term (current) drug therapy: Secondary | ICD-10-CM

## 2015-02-23 DIAGNOSIS — I701 Atherosclerosis of renal artery: Secondary | ICD-10-CM | POA: Diagnosis not present

## 2015-02-23 DIAGNOSIS — I6523 Occlusion and stenosis of bilateral carotid arteries: Secondary | ICD-10-CM

## 2015-02-23 NOTE — Patient Instructions (Addendum)
Medication Instructions:  Your physician recommends that you continue on your current medications as directed. Please refer to the Current Medication list given to you today.  Labwork: Your physician recommends that you return for lab work in: 1 month for bmet and mg  Testing/Procedures: NONE  Follow-Up: Your physician wants you to follow-up in: 4 months with Dr. Tamala Julian.    If you need a refill on your cardiac medications before your next appointment, please call your pharmacy.

## 2015-02-23 NOTE — Progress Notes (Signed)
Cardiology Office Note   Date:  02/23/2015   ID:  TAWN BUROKER, DOB 1935-02-06, MRN [id number]  PCP:  Henrine Screws, MD  Cardiologist:  Sinclair Grooms, MD   Chief Complaint  Patient presents with  . Atrial Fibrillation      History of Present Illness: Donna [name] is a 80 y.o. female who presents for renal vascular hypertension, paroxysmal atrial fibrillation, diastolic heart failure, carotid artery disease, chronic anticoagulation therapy.  Medication organization and administration has significantly improved. No complaints or complications on the current medical regimen. Specifically no syncope, chest pain, dyspnea, or neurological complaints.    Past Medical History  Diagnosis Date  . Essential hypertension   . Atherosclerosis of renal artery (HCC)     a. s/p R RA stenting;  b. 04/2014 Renal Art duplex: Patent R RA stent, <60% L RA stenosis. Followed by [facility].  . Fibrocystic breast   . Fibromuscular dysplasia ([place])   . Carotid arterial disease ([place])     a. 01/2014 Carotid U/S: RICA [id number], LICA [id number]. Followed by [facility].  . Paroxysmal atrial fibrillation ([place])     a. Dx 07/26/2014, CHA2DS2VASc = 5-->Eliquis;  c. 08/2014 Echo: EF 55-60%, no rwma, mildly dil LA/RA, mod-sev TR, PASP 55mmHg. b. s/p DCCV 08/2014. c. back in atrial flutter 09/2014 -> rate control pursued.  . Paroxysmal atrial flutter ([place])     a. Dx 07/26/2014, CHA2DS2VASc = 5-->Eliquis;  c. 08/2014 Echo: EF 55-60%, no rwma, mildly dil LA/RA, mod-sev TR, PASP 45mmHg. b. s/p DCCV 08/2014. c. back in atrial flutter 09/2014 -> rate control pursued.  . CKD (chronic kidney disease), stage III     Stage II/III  . Tricuspid regurgitation     a. Echo 08/2014: mod-severe.  . Mitral regurgitation     a. Echo 08/2014: mild MR.  Marland Kitchen Hyperglycemia     Past Surgical History  Procedure Laterality Date  . Renal artery stent  2008    Right renal artery by Dr. Drucie Opitz  . Hemorroidectomy    . Tubal ligation  1970's  .  Tonsillectomy    . Cardioversion N/A 09/01/2014    Procedure: CARDIOVERSION;  Surgeon: Josue Hector, MD;  Location: [place];  Service: Cardiovascular;  Laterality: N/A;  . Cardioversion N/A 11/23/2014    Procedure: CARDIOVERSION;  Surgeon: Skeet Latch, MD;  Location: [facility] ENDOSCOPY;  Service: Cardiovascular;  Laterality: N/A;     Current Outpatient Prescriptions  Medication Sig Dispense Refill  . acetaminophen-codeine (TYLENOL #3) 300-30 MG per tablet Take 1-2 tablets by mouth every 6 (six) hours as needed for moderate pain. 15 tablet 0  . apixaban (ELIQUIS) 5 MG TABS tablet Take 1 tablet (5 mg total) by mouth 2 (two) times daily. 60 tablet 5  . B Complex-C (SUPER B COMPLEX PO) Take 1 tablet by mouth daily.    . carvedilol (COREG) 6.25 MG tablet Take 1 tablet (6.25 mg total) by mouth 2 (two) times daily. 180 tablet 3  . dofetilide (TIKOSYN) 125 MCG capsule Take 3 capsules (375 mcg total) by mouth 2 (two) times daily. 90 capsule 11  . ESTRACE VAGINAL 0.1 MG/GM vaginal cream Place 1 Applicatorful vaginally every Monday, Wednesday, and Friday.     . furosemide (LASIX) 20 MG tablet Take one tablet (20 mg) by mouth ever other day on Monday, Wednesday, Friday 90 tablet 1  . magnesium oxide (MAG-OX) 400 (241.3 Mg) MG tablet Take 1 tablet (400 mg total) by mouth every morning. [place]  tablet 11  . mirtazapine (REMERON) 15 MG tablet Take 15 mg by mouth at bedtime.    . Multiple Vitamin (MULTIVITAMIN PO) Take 1 tablet by mouth daily.     . nitroGLYCERIN (NITROSTAT) 0.4 MG SL tablet Place 1 tablet (0.4 mg total) under the tongue every 5 (five) minutes as needed for chest pain. 25 tablet 3  . potassium chloride SA (KLOR-CON M20) 20 MEQ tablet Take 1 tablet (20 mEq total) by mouth daily. 90 tablet 3   No current facility-administered medications for this visit.    Allergies:   Tagamet    Social History:  The patient  reports that she has never smoked. She has never used smokeless tobacco. She  reports that she drinks alcohol. She reports that she does not use illicit drugs.   Family History:  The patient's family history includes Aneurysm in her father; Cancer (age of onset: 7) in her sister; Diabetes in her father and mother; Heart attack in her father; Hypertension in her father and mother; Stroke (age of onset: 32) in her mother.    ROS:  Please see the history of present illness.   Otherwise, review of systems are positive for decreased appetite and weight loss.   All other systems are reviewed and negative.    PHYSICAL EXAM: VS:  BP 180/92 mmHg  Pulse 71  Ht 5\' 2"  (1.575 m)  Wt 106 lb 12.8 oz (48.444 kg)  BMI 19.53 kg/m2 , BMI Body mass index is 19.53 kg/(m^2). GEN: Well nourished, well developed, in no acute distress HEENT: normal Neck: no JVD, carotid bruits, or masses Cardiac: RRR.  There is no murmur, rub, or gallop. There is no edema. Respiratory:  clear to auscultation bilaterally, normal work of breathing. GI: soft, nontender, nondistended, + BS MS: no deformity or atrophy Skin: warm and dry, no rash Neuro:  Strength and sensation are intact Psych: euthymic mood, full affect   EKG:  EKG is ordered today. The ekg reveals sinus bradycardia with first-degree AV block and nonspecific ST abnormality with QT of 450 ms.   Recent Labs: 07/25/2014: ALT 48 11/19/2014: B Natriuretic Peptide 1196.4*; TSH 0.443 11/29/2014: Magnesium 1.9 12/06/2014: Hemoglobin 13.0; Platelets 204 01/11/2015: BUN 22; Creat 0.93; Potassium 4.0; Sodium 141    Lipid Panel No results found for: CHOL, TRIG, HDL, CHOLHDL, VLDL, LDLCALC, LDLDIRECT    Wt Readings from Last 3 Encounters:  02/23/15 106 lb 12.8 oz (48.444 kg)  02/15/15 108 lb (48.988 kg)  02/13/15 104 lb (47.174 kg)      Other studies Reviewed: Additional studies/ records that were reviewed today include: Review data.. The findings include no new entries.    ASSESSMENT AND PLAN:  1. PAF (paroxysmal atrial  fibrillation) (HCC) No clinical recurrence - EKG 12-Lead  2. CKD (chronic kidney disease), stage II Will be evaluated with blood work in the next several weeks.  3. Renovascular hypertension Elevated systolic pressure initially with blood pressure decreasing to 145/80 on repeat.  4. Visit for monitoring Tikosyn therapy QT is 450 ms. - EKG 12-Lead  5. Chronic anticoagulation Now on Eliquis and no complaints - EKG 12-Lead  6. Bilateral carotid artery stenosis Asymptomatic - EKG 12-Lead    Current medicines are reviewed at length with the patient today.  The patient has the following concerns regarding medicines: .  The following changes/actions have been instituted:    Magnesium and basic metabolic panel in 1 month  ECG on return in 4 months  Labs/ tests ordered today  include:   Orders Placed This Encounter  Procedures  . Basic Metabolic Panel (BMET)  . Magnesium  . EKG 12-Lead     Disposition:   FU with HS in 4 months  Signed, Sinclair Grooms, MD  02/23/2015 11:19 AM    [facility] [address], [place], [place]  [address] Phone: [phone]; Fax: [phone]

## 2015-02-26 DIAGNOSIS — N644 Mastodynia: Secondary | ICD-10-CM | POA: Diagnosis not present

## 2015-02-26 DIAGNOSIS — R413 Other amnesia: Secondary | ICD-10-CM | POA: Diagnosis not present

## 2015-02-27 ENCOUNTER — Other Ambulatory Visit: Payer: Self-pay

## 2015-02-27 ENCOUNTER — Encounter: Payer: Self-pay | Admitting: Interventional Cardiology

## 2015-02-27 VITALS — BP 162/82 | HR 67 | Resp 16 | Ht 62.0 in | Wt 107.0 lb

## 2015-02-27 DIAGNOSIS — I4891 Unspecified atrial fibrillation: Secondary | ICD-10-CM

## 2015-02-27 NOTE — Patient Outreach (Signed)
[place] [facility]) Care Management  02/27/2015  Donna [name] [birth year] [id number]   Subjective: "I tried to get clean for you, I have had this odor. It has really been bothering me..."  Objective:BP 162/82 mmHg  Pulse 67  Resp 16  Wt 107 lb (48.535 kg)  SpO2 100%  Assessment: 80 year old referral from gynecologist for memory issues/safety concerns/inappropriate MD office utilization. Member/caregiver goals: education/management of atrial fibrillation and weight loss issue. Member with a history of atrial fibrillation, heart failure, hypertension, kidney disease. Denies medication assistance needs. Member saw primary care physician on yesterday regarding her memory issues and was prescribed a new medication. Mr. Spotts reports he has to pick the medication up from pharmacy-unable to tell RNCM the name. Mr. Quinlin denies medication management issues stating he and his daughter are filling her medication box and he has no questions or concerns.  Member voiced concern of vaginal odor. RNCM provided support. RNCM encouraged member to use vaginal cream twice a week as prescribed. Continue to good hygiene.   Viewed/discussed EMMI video atrial fibrillation-utilized teach back method.  Weight loss-Member was seen by nutritionist and is implementing the strategies that were recommended. Mr. Primer is preparing meals. Member reports she is eating more including a good breakfast every morning and drinking nutritional supplement twice a day. Member is weighing, but not recording weights. RNCM reinforced eating nutritional meals, suggested mixing nutritional supplement with ice cream also,encouraged to keep track of weights and record.  No additional care management needs identified at this time. RNCM discussed continued involvement with [facility] care management through health coach for further education regarding heart failure zone tool and when to call the doctor in light of history of atrial  fibrillation. Mr. Wiess and member are agreeable to transition to health coach for continued disease management - would like reinforcement of heart failure management-history of atrial fibrillation.  Reinforced 24 hour nurse advice line-demonstrated where the number can be found in [facility] calendar organizer. And encouraged member/caregiver to call with questions/concerns as needed. Ensured member/caregiver has RNCM's contact number.  Plan: Transition to health coach.  Thea Silversmith, RN, MSN, [place] Coordinator Cell: [phone]

## 2015-03-05 ENCOUNTER — Other Ambulatory Visit: Payer: Self-pay

## 2015-03-05 NOTE — Patient Outreach (Signed)
[place] [facility]) Care Management  03/05/2015  GABRIELLE GREENHILL 1935-08-01 [id number]   Telephone call to patient for health coach introductory call. No answer. HIPAA compliant voice message left.    Plan: RN Health Coach will attempt patient again within 1-2 weeks.    Jone Baseman, RN, MSN [place] [phone]

## 2015-03-06 ENCOUNTER — Other Ambulatory Visit: Payer: Self-pay

## 2015-03-06 NOTE — Patient Outreach (Addendum)
[place] [facility]) Care Management  03/06/2015  OMARIA SEGUIN 1935/06/10 [id number]   Telephone call to patient for health coach introductory call. Patient reports she is doing good.  Explained to patient health coach role and transition from community nurse to health coach.  She verbalized understanding. No concerns.   Plan:  RN Health Coach will send welcome letter to patient.  RN Health Coach will contact patient within one month and patient agrees to next outreach.    Jone Baseman, RN, MSN [place] [phone]

## 2015-03-08 ENCOUNTER — Ambulatory Visit: Payer: Medicare Other

## 2015-03-22 DIAGNOSIS — Z23 Encounter for immunization: Secondary | ICD-10-CM | POA: Diagnosis not present

## 2015-03-23 ENCOUNTER — Other Ambulatory Visit (INDEPENDENT_AMBULATORY_CARE_PROVIDER_SITE_OTHER): Payer: Medicare Other | Admitting: *Deleted

## 2015-03-23 DIAGNOSIS — Z79899 Other long term (current) drug therapy: Secondary | ICD-10-CM | POA: Diagnosis not present

## 2015-03-24 LAB — MAGNESIUM: Magnesium: 1.9 mg/dL (ref 1.5–2.5)

## 2015-03-24 LAB — BASIC METABOLIC PANEL
BUN: 28 mg/dL — ABNORMAL HIGH (ref 7–25)
CALCIUM: 9.7 mg/dL (ref 8.6–10.4)
CHLORIDE: 105 mmol/L (ref 98–110)
CO2: 27 mmol/L (ref 20–31)
CREATININE: 0.8 mg/dL (ref 0.60–0.93)
GLUCOSE: 92 mg/dL (ref 65–99)
Potassium: 4.5 mmol/L (ref 3.5–5.3)
Sodium: 139 mmol/L (ref 135–146)

## 2015-03-27 ENCOUNTER — Other Ambulatory Visit: Payer: Self-pay

## 2015-03-27 NOTE — Patient Outreach (Signed)
[facility]) Care Management  [place]  03/27/2015   Donna [name] 01-Sep-1935 [id number]  Subjective: Telephone call to patient for initial health coach assessment. Patient reports she is doing well.  Patient reports that she is eating and has gained some weight. Patient continues to drink supplements.  Husband is active in care giving and reviewed medications.  Discussed with patient signs and symptoms of heart failure and when to notify physician.  She verbalized understanding.    Objective:   Current Medications:  Current Outpatient Prescriptions  Medication Sig Dispense Refill  . acetaminophen-codeine (TYLENOL #3) 300-30 MG per tablet Take 1-2 tablets by mouth every 6 (six) hours as needed for moderate pain. 15 tablet 0  . apixaban (ELIQUIS) 5 MG TABS tablet Take 1 tablet (5 mg total) by mouth 2 (two) times daily. 60 tablet 5  . B Complex-C (SUPER B COMPLEX PO) Take 1 tablet by mouth daily.    . carvedilol (COREG) 6.25 MG tablet Take 1 tablet (6.25 mg total) by mouth 2 (two) times daily. 180 tablet 3  . dofetilide (TIKOSYN) 125 MCG capsule Take 3 capsules (375 mcg total) by mouth 2 (two) times daily. 90 capsule 11  . ESTRACE VAGINAL 0.1 MG/GM vaginal cream Place 1 Applicatorful vaginally every Monday, Wednesday, and Friday.     . furosemide (LASIX) 20 MG tablet Take one tablet (20 mg) by mouth ever other day on Monday, Wednesday, Friday 90 tablet 1  . magnesium oxide (MAG-OX) 400 (241.3 Mg) MG tablet Take 1 tablet (400 mg total) by mouth every morning. 30 tablet 11  . mirtazapine (REMERON) 15 MG tablet Take 15 mg by mouth at bedtime.    . Multiple Vitamin (MULTIVITAMIN PO) Take 1 tablet by mouth daily.     . nitroGLYCERIN (NITROSTAT) 0.4 MG SL tablet Place 1 tablet (0.4 mg total) under the tongue every 5 (five) minutes as needed for chest pain. 25 tablet 3  . potassium chloride SA (KLOR-CON M20) 20 MEQ tablet Take 1 tablet (20 mEq total) by mouth daily. 90  tablet 3   No current facility-administered medications for this visit.    Functional Status:  In your present state of health, do you have any difficulty performing the following activities: 03/27/2015 03/06/2015  Hearing? N N  Vision? N N  Difficulty concentrating or making decisions? N N  Walking or climbing stairs? N N  Dressing or bathing? N N  Doing errands, shopping? N N  Preparing Food and eating ? N N  Using the Toilet? N N  In the past six months, have you accidently leaked urine? N N  Do you have problems with loss of bowel control? N N  Managing your Medications? N N  Managing your Finances? N N  Housekeeping or managing your Housekeeping? N N    Fall/Depression Screening: PHQ 2/9 Scores 03/27/2015 03/06/2015 02/15/2015 02/13/2015 02/01/2015  PHQ - 2 Score 0 0 0 0 0    Assessment: Patient/caregiver will benefit from health coach outreach for disease management and support.  Plan:  [facility] CM Care Plan Problem One        Most Recent Value   Care Plan Problem One  knowledge deficit related to heart failure   Role Documenting the Problem One  [place] for Problem One  Active   THN Long Term Goal (31-90 days)  Patient will verbalize following heart failure action plan as a daily guide to maintenance within 90 days.  THN Long Term Goal Start Date  03/27/15   Interventions for Problem One Home discussed with patient/caregiver heart failure action plan.     THN CM Care Plan Problem Two        Most Recent Value   Care Plan Problem Two  weight loss   Role Documenting the Problem Two  [place] for Problem Two  Active   THN CM Short Term Goal #1 (0-30 days)  Patient will monitor weight at least weekly within the next 30 days.   THN CM Short Term Goal #1 Start Date  03/27/15   Interventions for Short Term Goal #2   [place] discussed with patient importance of weights. Also discussed use of supplements and eating a  balanced diet.     RN Health Coach will provide ongoing education for patient on heart failure through phone calls and sending printed information to patient for further discussion.  RN Health Coach will send welcome letter.  RN Health Coach will send initial barriers letter, assessment, and care plan to primary care physician.  RN Health Coach will contact patient within one month and patient agrees to next contact.   Jone Baseman, RN, MSN [place] [phone]

## 2015-03-28 DIAGNOSIS — R413 Other amnesia: Secondary | ICD-10-CM | POA: Diagnosis not present

## 2015-04-02 DIAGNOSIS — N644 Mastodynia: Secondary | ICD-10-CM | POA: Diagnosis not present

## 2015-04-02 DIAGNOSIS — R413 Other amnesia: Secondary | ICD-10-CM | POA: Diagnosis not present

## 2015-04-09 ENCOUNTER — Other Ambulatory Visit: Payer: Self-pay | Admitting: Nurse Practitioner

## 2015-04-10 ENCOUNTER — Telehealth: Payer: Self-pay | Admitting: *Deleted

## 2015-04-10 NOTE — Telephone Encounter (Signed)
Patients husband, [name] left a message on the refill voicemail stating that the patients insurance had lapsed. He wanted to know if she could get tikosyn samples until they could get the insurance rectified.  Call back number provided was [phone]. Please advise. Thanks, MI

## 2015-04-10 NOTE — Telephone Encounter (Signed)
Patients husband, [name] left a message on the refill voicemail stating that the patients insurance had lapsed. He wanted to know if she could get tikosyn samples until they could get the insurance rectified. Call back number provided was [phone]. Please advise. Thanks, MI

## 2015-04-11 NOTE — Telephone Encounter (Signed)
Spoke with pt's husband.  He has plans to call [employer] today to see if he can get the insurance coverage issues resolved.  I will leave a 1 week supply at the front test.  I also offered to start patient assistance paperwork but he would like to see if he can get insurance coverage fixed first.

## 2015-04-24 ENCOUNTER — Other Ambulatory Visit: Payer: Self-pay

## 2015-04-24 NOTE — Patient Outreach (Signed)
[facility]) Care Management  [place]  04/24/2015   ERYNNE STEENSON [birth year] [id number]  Subjective: Telephone call to patient for monthly call. Patient reports she is doing ok.  Patient reports she is concerned with her weight and that she has actually lost some weight. Patient reports she is drinking supplement but not regularly. Discussed with patient the importance of balanced diet and regular use of supplements to help maintain her weight.  Also discussed with patient heart failure and when to notify physician.  She verbalized understanding.  No concerns.   Objective:   Encounter Medications:  Outpatient Encounter Prescriptions as of 04/24/2015  Medication Sig Note  . acetaminophen-codeine (TYLENOL #3) 300-30 MG per tablet Take 1-2 tablets by mouth every 6 (six) hours as needed for moderate pain.   . B Complex-C (SUPER B COMPLEX PO) Take 1 tablet by mouth daily.   . carvedilol (COREG) 6.25 MG tablet Take 1 tablet (6.25 mg total) by mouth 2 (two) times daily.   Marland Kitchen dofetilide (TIKOSYN) 125 MCG capsule Take 3 capsules (375 mcg total) by mouth 2 (two) times daily.   Marland Kitchen ELIQUIS 5 MG TABS tablet TAKE ONE TABLET BY MOUTH TWICE DAILY   . ESTRACE VAGINAL 0.1 MG/GM vaginal cream Place 1 Applicatorful vaginally every Monday, Wednesday, and Friday.  02/15/2015: Husband reports taking twice a week.   . furosemide (LASIX) 20 MG tablet Take one tablet (20 mg) by mouth ever other day on Monday, Wednesday, Friday   . magnesium oxide (MAG-OX) 400 (241.3 Mg) MG tablet Take 1 tablet (400 mg total) by mouth every morning.   . mirtazapine (REMERON) 15 MG tablet Take 15 mg by mouth at bedtime.   . Multiple Vitamin (MULTIVITAMIN PO) Take 1 tablet by mouth daily.    . nitroGLYCERIN (NITROSTAT) 0.4 MG SL tablet Place 1 tablet (0.4 mg total) under the tongue every 5 (five) minutes as needed for chest pain.   . potassium chloride SA (KLOR-CON M20) 20 MEQ tablet Take 1 tablet (20 mEq total)  by mouth daily.    No facility-administered encounter medications on file as of 04/24/2015.    Functional Status:  In your present state of health, do you have any difficulty performing the following activities: 03/27/2015 03/06/2015  Hearing? N N  Vision? N N  Difficulty concentrating or making decisions? N N  Walking or climbing stairs? N N  Dressing or bathing? N N  Doing errands, shopping? N N  Preparing Food and eating ? N N  Using the Toilet? N N  In the past six months, have you accidently leaked urine? N N  Do you have problems with loss of bowel control? N N  Managing your Medications? N N  Managing your Finances? N N  Housekeeping or managing your Housekeeping? N N    Fall/Depression Screening: PHQ 2/9 Scores 04/24/2015 03/27/2015 03/06/2015 02/15/2015 02/13/2015 02/01/2015  PHQ - 2 Score 0 0 0 0 0 0    Assessment: Patient continues to benefit from health coach outreach for disease management and support.    Plan:  [facility] CM Care Plan Problem One        Most Recent Value   Care Plan Problem One  knowledge deficit related to heart failure   Role Documenting the Problem One  [place] for Problem One  Active   THN Long Term Goal (31-90 days)  Patient will verbalize following heart failure action plan as a daily guide to  maintenance within 90 days.    THN Long Term Goal Start Date  03/27/15 Barrie Folk continued]   Interventions for Problem One Long Term Goal  RN Health Coach reviewed with patient/caregiver heart failure action plan.     THN CM Care Plan Problem Two        Most Recent Value   Care Plan Problem Two  weight loss   Role Documenting the Problem Two  [place] for Problem Two  Active   THN CM Short Term Goal #1 (0-30 days)  Patient will monitor weight at least weekly within the next 30 days.   THN CM Short Term Goal #1 Start Date  04/24/15 [goal continued]   Interventions for Short Term Goal #2   [place] reviewed with patient  importance of weights. Also reviewed use of supplements and eating a balanced diet.     RN Health Coach will contact patient within one month and patient agrees to next outreach.   Jone Baseman, RN, MSN [place] [phone]

## 2015-04-27 DIAGNOSIS — N644 Mastodynia: Secondary | ICD-10-CM | POA: Diagnosis not present

## 2015-04-27 DIAGNOSIS — R413 Other amnesia: Secondary | ICD-10-CM | POA: Diagnosis not present

## 2015-04-27 DIAGNOSIS — R634 Abnormal weight loss: Secondary | ICD-10-CM | POA: Diagnosis not present

## 2015-04-27 DIAGNOSIS — I4891 Unspecified atrial fibrillation: Secondary | ICD-10-CM | POA: Diagnosis not present

## 2015-04-27 DIAGNOSIS — R630 Anorexia: Secondary | ICD-10-CM | POA: Diagnosis not present

## 2015-05-01 ENCOUNTER — Telehealth: Payer: Self-pay | Admitting: Interventional Cardiology

## 2015-05-01 ENCOUNTER — Encounter: Payer: Self-pay | Admitting: Family

## 2015-05-01 NOTE — Telephone Encounter (Signed)
New message ° ° ° ° ° ° °Patient calling the office for samples of medication: ° ° °1.  What medication and dosage are you requesting samples for? eliquis 5mg ° °2.  Are you currently out of this medication? Almost out ° ° °

## 2015-05-01 NOTE — Telephone Encounter (Signed)
Patient contacted about Eliquis 5mg  samples being left at the front desk. Patient husband will more likely pick them up for her.

## 2015-05-03 ENCOUNTER — Other Ambulatory Visit: Payer: Self-pay | Admitting: Obstetrics and Gynecology

## 2015-05-03 DIAGNOSIS — N644 Mastodynia: Secondary | ICD-10-CM | POA: Diagnosis not present

## 2015-05-03 DIAGNOSIS — N63 Unspecified lump in unspecified breast: Secondary | ICD-10-CM

## 2015-05-04 ENCOUNTER — Encounter: Payer: Self-pay | Admitting: Family

## 2015-05-04 ENCOUNTER — Other Ambulatory Visit: Payer: Self-pay | Admitting: Vascular Surgery

## 2015-05-04 ENCOUNTER — Ambulatory Visit (HOSPITAL_COMMUNITY)
Admission: RE | Admit: 2015-05-04 | Discharge: 2015-05-04 | Disposition: A | Discharge status: Home / Self Care | Payer: Medicare Other | Source: Ambulatory Visit | Attending: Family | Admitting: Family

## 2015-05-04 ENCOUNTER — Ambulatory Visit (INDEPENDENT_AMBULATORY_CARE_PROVIDER_SITE_OTHER): Payer: Medicare Other | Admitting: Family

## 2015-05-04 VITALS — BP 195/91 | HR 53 | Temp 98.3°F | Resp 14 | Ht 62.0 in | Wt 105.0 lb

## 2015-05-04 DIAGNOSIS — Z9889 Other specified postprocedural states: Secondary | ICD-10-CM | POA: Diagnosis not present

## 2015-05-04 DIAGNOSIS — IMO0002 Reserved for concepts with insufficient information to code with codable children: Secondary | ICD-10-CM

## 2015-05-04 DIAGNOSIS — I701 Atherosclerosis of renal artery: Secondary | ICD-10-CM | POA: Diagnosis not present

## 2015-05-04 DIAGNOSIS — R030 Elevated blood-pressure reading, without diagnosis of hypertension: Secondary | ICD-10-CM | POA: Diagnosis not present

## 2015-05-04 DIAGNOSIS — N183 Chronic kidney disease, stage 3 (moderate): Secondary | ICD-10-CM | POA: Insufficient documentation

## 2015-05-04 DIAGNOSIS — Z959 Presence of cardiac and vascular implant and graft, unspecified: Secondary | ICD-10-CM

## 2015-05-04 DIAGNOSIS — I129 Hypertensive chronic kidney disease with stage 1 through stage 4 chronic kidney disease, or unspecified chronic kidney disease: Secondary | ICD-10-CM | POA: Diagnosis not present

## 2015-05-04 DIAGNOSIS — Z48812 Encounter for surgical aftercare following surgery on the circulatory system: Secondary | ICD-10-CM

## 2015-05-04 DIAGNOSIS — Z4889 Encounter for other specified surgical aftercare: Secondary | ICD-10-CM

## 2015-05-04 DIAGNOSIS — I6522 Occlusion and stenosis of left carotid artery: Secondary | ICD-10-CM

## 2015-05-04 DIAGNOSIS — IMO0001 Reserved for inherently not codable concepts without codable children: Secondary | ICD-10-CM

## 2015-05-04 DIAGNOSIS — I10 Essential (primary) hypertension: Secondary | ICD-10-CM | POA: Diagnosis not present

## 2015-05-04 NOTE — Patient Instructions (Signed)
Renal Artery Stenosis °Renal artery stenosis (RAS) is narrowing of the artery that carries blood to your kidneys. It can affect one or both kidneys. °Your kidneys filter waste and extra fluid from your blood. You get rid of the waste and fluid when you urinate. Your kidneys also make an important chemical messenger (hormone) called renin. Renin helps regulate your blood pressure. The first sign of RAS may be high blood pressure. Over time, other symptoms can develop. °CAUSES  °Plaque buildup in your arteries (atherosclerosis) is the main cause of RAS. The plaques that cause this are made up of: °· Fat. °· Cholesterol. °· Calcium. °· Other substances. °As these substances build up in your renal artery, this slows the blood supply to your kidneys. The lack of blood and oxygen causes the signs and symptoms of RAS. °A much less common cause of RAS is a disease called fibromuscular dysplasia. This disease causes abnormal cell growth that narrows the renal artery. It is not related to atherosclerosis. It occurs mostly in women who are 25-50 years old. It may be passed down through families. °RISK FACTORS  °You may be at risk for renal artery stenosis if you: °· Are a man who is at least 78 years old. °· Are a woman who is at least 78 years old. °· Have high blood pressure. °· Have high cholesterol. °· Are a smoker. °· Abuse alcohol. °· Have diabetes or prediabetes. °· Are overweight. °· Have a family history of early heart disease. °SIGNS AND SYMPTOMS  °RAS usually develops slowly. You may not have any signs or symptoms at first. The earliest signs may be: °· Developing high blood pressure. °· A sudden increase in existing high blood pressure. °· No longer responding to medicine that used to control your blood pressure. °Later signs and symptoms are due to kidney damage. They may include: °· Fatigue. °· Shortness of breath. °· Swollen legs and feet. °· Dry skin. °· Headaches. °· Muscle cramps. °· Loss of  appetite. °· Nausea or vomiting. °DIAGNOSIS  °Your health care provider may suspect RAS based on changes in your blood pressure and your risk factors. A physical exam will be done. Your health care provider may use a stethoscope to listen for a whooshing sound (bruit) that can occur where the renal artery is blocking blood flow. Several tests may be done to confirm a diagnosis of RAS. These may include: °· Blood and urine tests to check your kidney function. °· Imaging tests of your kidneys, such as: °¨ A test that involves using sound waves to create an image of your kidneys and the blood flow to your kidneys (ultrasound). °¨ A test in which dye is injected into one of your blood vessels so images can be taken as the dye flows through your renal arteries (angiogram). These tests can be done using X-rays, a CT scan (computed tomography angiogram, CTA), or a type of MRI (magnetic resonance angiogram, MRA). °TREATMENT  °Making lifestyle changes to reduce your risk factors is the first treatment option for early RAS. If the blood flow to one of your kidneys is cut by more than half, you may need medicine to: °· Lower your blood pressure. This is the main medical treatment for RAS. You may need more than one type of medicine for this. The two types that work best for RAS are: °¨ ACE inhibitors. °¨ Angiotensin receptor blockers. °· Reduce fluid in the body (diuretics). °· Lower your cholesterol (statins). °If medicine is not enough   to control RAS, you may need surgery. This may involve: °· Threading a tube with an inflatable balloon into the renal artery to force it open (angioplasty). °· Removing plaque from inside the artery (endarterectomy). °HOME CARE INSTRUCTIONS °· Take medicines only as directed by your health care provider. °· Make any lifestyle changes recommended by your health care provider. This may include: °¨ Working with a dietitian to maintain a heart-healthy diet. This type of diet is low in saturated  fat, salt, and added sugar. °¨ Starting an exercise program as directed by your health care provider. °¨ Maintaining a healthy weight. °¨ Quitting smoking. °¨ Not abusing alcohol. °· Keep all follow-up visits as directed by your health care provider. This is important. °SEEK MEDICAL CARE IF: °· Your symptoms of RAS are not getting better. °· Your symptoms are changing or getting worse. °SEEK IMMEDIATE MEDICAL CARE IF: °· You have very bad pain in your back or abdomen. °· You have blood in your urine. °  °This information is not intended to replace advice given to you by your health care provider. Make sure you discuss any questions you have with your health care provider. °  °Document Released: 09/18/2004 Document Revised: 01/13/2014 Document Reviewed: 04/07/2013 °Elsevier Interactive Patient Education ©2016 Elsevier Inc. ° °

## 2015-05-04 NOTE — Progress Notes (Signed)
CC: Follow up Renal Artery Stenosis  History of Present Illness  Donna [name] is a 80 y.o. (1935-08-27) female patient of Dr. Bridgett Larsson who presents with chief complaint: follow up renal artery stenosis. Pt has been seen for prior right renal artery stenting in 2008 and possible renovascular HTN. Her BP regimen is stable at this point. She takes carvedilol and furosemide as antihypertensives.  The patient's urinary history has remained stable. In regards to possible prior CT suggests of FMD, she continues to be complete asx in regards to carotid disease.  She states she did no take her morning medications today yet, does not check her blood pressure at home, and states her blood pressure increases in a doctor office.   Pt denies any hx of MI, denies any hx of TIA or stroke.  On ROS today:  no change in anti-HTN regimen    Past Medical History  Diagnosis Date  . Essential hypertension   . Atherosclerosis of renal artery (HCC)     a. s/p R RA stenting;  b. 04/2014 Renal Art duplex: Patent R RA stent, <60% L RA stenosis. Followed by [facility].  . Fibrocystic breast   . Fibromuscular dysplasia ([place])   . Carotid arterial disease ([place])     a. 01/2014 Carotid U/S: RICA [id number], LICA [id number]. Followed by [facility].  . Paroxysmal atrial fibrillation ([place])     a. Dx 07/26/2014, CHA2DS2VASc = 5-->Eliquis;  c. 08/2014 Echo: EF 55-60%, no rwma, mildly dil LA/RA, mod-sev TR, PASP 2mmHg. b. s/p DCCV 08/2014. c. back in atrial flutter 09/2014 -> rate control pursued.  . Paroxysmal atrial flutter ([place])     a. Dx 07/26/2014, CHA2DS2VASc = 5-->Eliquis;  c. 08/2014 Echo: EF 55-60%, no rwma, mildly dil LA/RA, mod-sev TR, PASP 78mmHg. b. s/p DCCV 08/2014. c. back in atrial flutter 09/2014 -> rate control pursued.  . CKD (chronic kidney disease), stage III     Stage II/III  . Tricuspid regurgitation     a. Echo 08/2014: mod-severe.  . Mitral regurgitation     a. Echo 08/2014: mild MR.  Marland Kitchen Hyperglycemia     Social History Social  History  Substance Use Topics  . Smoking status: Never Smoker   . Smokeless tobacco: Never Used  . Alcohol Use: Yes    Family History Family History  Problem Relation Age of Onset  . Stroke Mother 18  . Diabetes Mother   . Hypertension Mother   . Aneurysm Father     abdominal aortic aneurysm  . Diabetes Father   . Hypertension Father   . Cancer Sister 47    breast cancer  . Heart attack Father     Surgical History Past Surgical History  Procedure Laterality Date  . Renal artery stent  2008    Right renal artery by Dr. Drucie Opitz  . Hemorroidectomy    . Tubal ligation  1970's  . Tonsillectomy    . Cardioversion N/A 09/01/2014    Procedure: CARDIOVERSION;  Surgeon: Josue Hector, MD;  Location: [facility] ENDOSCOPY;  Service: Cardiovascular;  Laterality: N/A;  . Cardioversion N/A 11/23/2014    Procedure: CARDIOVERSION;  Surgeon: Skeet Latch, MD;  Location: [facility] ENDOSCOPY;  Service: Cardiovascular;  Laterality: N/A;    Allergies  Allergen Reactions  . Tagamet [Cimetidine] Hives    Current Outpatient Prescriptions  Medication Sig Dispense Refill  . acetaminophen-codeine (TYLENOL #3) 300-30 MG per tablet Take 1-2 tablets by mouth every 6 (six) hours as needed for moderate pain.  15 tablet 0  . B Complex-C (SUPER B COMPLEX PO) Take 1 tablet by mouth daily.    . carvedilol (COREG) 6.25 MG tablet Take 1 tablet (6.25 mg total) by mouth 2 (two) times daily. 180 tablet 3  . dofetilide (TIKOSYN) 125 MCG capsule Take 3 capsules (375 mcg total) by mouth 2 (two) times daily. 90 capsule 11  . ELIQUIS 5 MG TABS tablet TAKE ONE TABLET BY MOUTH TWICE DAILY 60 tablet 9  . ESTRACE VAGINAL 0.1 MG/GM vaginal cream Place 1 Applicatorful vaginally every Monday, Wednesday, and Friday.     . furosemide (LASIX) 20 MG tablet Take one tablet (20 mg) by mouth ever other day on Monday, Wednesday, Friday 90 tablet 1  . magnesium oxide (MAG-OX) 400 (241.3 Mg) MG tablet Take 1 tablet (400 mg total) by mouth  every morning. 30 tablet 11  . mirtazapine (REMERON) 15 MG tablet Take 15 mg by mouth at bedtime.    . Multiple Vitamin (MULTIVITAMIN PO) Take 1 tablet by mouth daily.     . nitroGLYCERIN (NITROSTAT) 0.4 MG SL tablet Place 1 tablet (0.4 mg total) under the tongue every 5 (five) minutes as needed for chest pain. 25 tablet 3  . potassium chloride SA (KLOR-CON M20) 20 MEQ tablet Take 1 tablet (20 mEq total) by mouth daily. 90 tablet 3   No current facility-administered medications for this visit.    REVIEW OF SYSTEMS: see HPI for pertinent positives and negatives    Physical Examination  Filed Vitals:   05/04/15 0944  BP: 200/92  Pulse: 55  Temp: 98.3 F (36.8 C)  TempSrc: Oral  Resp: 14  Height: 5\' 2"  (1.575 m)  Weight: 105 lb (47.628 kg)  SpO2: 99%   Body mass index is 19.2 kg/(m^2).   General: A&O x 3, WDWN, thin female  Pulmonary: Sym exp, non labored respirations, good air movt, CTAB  Cardiac: RRR, Nl S1, S2, no detected murmur, rubs or gallops  Vascular: Vessel Right Left  Radial Palpable Palpable  Brachial Palpable Palpable  Carotid Palpable, without bruit Palpable, without bruit  Aorta Not palpable N/A  Femoral Palpable Palpable  Popliteal Not palpable Not palpable  PT Not Palpable Not Palpable  DP Not Palpable Palpable   Gastrointestinal: soft, NTND, -G/R, - HSM, - palpable masses, - CVAT B  Musculoskeletal: M/S 5/5 throughout , Extremities without ischemic changes   Neurologic: Pain and light touch intact in extremities , Motor exam as listed above        Non-Invasive Vascular Imaging   (Date: 05/04/2015) RENAL ARTERY DUPLEX EVALUATION    INDICATION: Renal artery stenosis    PREVIOUS INTERVENTION(S): Right renal artery stent placed 09/23/2006    DUPLEX EXAM:     AORTA Peak Systolic Velocity (cm/s): 92    RIGHT  LEFT   Peak Systolic Velocities (cm/s) Comments  Peak Systolic Velocities (cm/s) Comments  140  Renal  Artery Origin/Proximal 374   148  Renal Artery Mid 76   125  Renal Artery Distal 188   -  Accessory Renal Artery (when present) -   1.6  Renal / Aortic Ratio (RAR) 4.0  10.0 Kidney Size (cm) 10.5  pulsatile  Renal Vein pulsatile    Peak Systolic Velocities (cm/s) Comments  Renal Artery Stent (  ) Peak Systolic Velocities (cm/s) Comments  N/V  Proximal      Mid      Distal      ADDITIONAL FINDINGS:     IMPRESSION: 1. Technically  difficult exam due to small body size 2. The right stent not visualized however no stenosis visualized. 3. Greater than 60% left proximal renal artery, limited visualization, may be tortuous    Compared to the previous exam:  Increased velocity left proximal renal artery since prior exam     Medical Decision Making  Donna [name] is a 80 y.o. female who is s/p right renal artery stenting in 2008 and possible renovascular HTN. Her BP regimen is stable at this point. She takes carvedilol and furosemide as antihypertensives. I do not know if her antiarrythmic medication, Tikosyn, has antihypertensive properties.  Repeat blood pressure is 195/91. Pt states she did not take her medications this morning: carvedilol, furosemide, Tikosyn. I advised pt to take her morning medications ASAP and to see her PCP today re her elevated blood pressure. I discussed with Dr. Bridgett Larsson pt HPI, current blood pressures, and medication she takes. Follow up with Dr. Bridgett Larsson ASAP re discussion of possible intervention for RAS.    I discussed in depth with the patient the nature of atherosclerosis, and emphasized the importance of maximal medical management including strict control of blood pressure, blood glucose, and lipid levels, antiplatelet agents, obtaining regular exercise, and continued cessation of smoking.  The patient is aware that without maximal medical management the underlying atherosclerotic disease process will progress, limiting the benefit of any interventions.  Thank  you for allowing [place] to participate in this patient's care.  NICKEL, Sharmon Leyden, RN, MSN, FNP-C [facility] Office: [phone]  05/04/2015, 9:58 AM  Clinic MD:  Bridgett Larsson

## 2015-05-07 ENCOUNTER — Ambulatory Visit
Admission: RE | Admit: 2015-05-07 | Discharge: 2015-05-07 | Disposition: A | Discharge status: Home / Self Care | Payer: 59 | Source: Ambulatory Visit | Attending: Obstetrics and Gynecology | Admitting: Obstetrics and Gynecology

## 2015-05-07 ENCOUNTER — Ambulatory Visit
Admission: RE | Admit: 2015-05-07 | Discharge: 2015-05-07 | Disposition: A | Discharge status: Home / Self Care | Payer: Medicare Other | Source: Ambulatory Visit | Attending: Obstetrics and Gynecology | Admitting: Obstetrics and Gynecology

## 2015-05-07 DIAGNOSIS — N6489 Other specified disorders of breast: Secondary | ICD-10-CM | POA: Diagnosis not present

## 2015-05-07 DIAGNOSIS — N63 Unspecified lump in unspecified breast: Secondary | ICD-10-CM

## 2015-05-07 DIAGNOSIS — N644 Mastodynia: Secondary | ICD-10-CM

## 2015-05-07 DIAGNOSIS — R928 Other abnormal and inconclusive findings on diagnostic imaging of breast: Secondary | ICD-10-CM | POA: Diagnosis not present

## 2015-05-09 DIAGNOSIS — R079 Chest pain, unspecified: Secondary | ICD-10-CM | POA: Diagnosis not present

## 2015-05-17 DIAGNOSIS — Z0000 Encounter for general adult medical examination without abnormal findings: Secondary | ICD-10-CM | POA: Diagnosis not present

## 2015-05-17 DIAGNOSIS — Z1389 Encounter for screening for other disorder: Secondary | ICD-10-CM | POA: Diagnosis not present

## 2015-05-21 ENCOUNTER — Other Ambulatory Visit: Payer: Self-pay

## 2015-05-21 ENCOUNTER — Ambulatory Visit: Payer: Self-pay

## 2015-05-21 NOTE — Patient Outreach (Signed)
[facility]) Care Management  05/21/2015  Donna [name] 01-16-1935 [id number]   Telephone call to patient for monthly call.  No answer.  HIPAA complaint voice message left.  Plan: RN Health Coach will attempt patient within 1-2 weeks.   Jone Baseman, RN, MSN [place] [phone]

## 2015-05-24 ENCOUNTER — Telehealth: Payer: Self-pay | Admitting: Interventional Cardiology

## 2015-05-24 ENCOUNTER — Other Ambulatory Visit: Payer: Self-pay

## 2015-05-24 DIAGNOSIS — Z1231 Encounter for screening mammogram for malignant neoplasm of breast: Secondary | ICD-10-CM

## 2015-05-24 NOTE — Telephone Encounter (Signed)
Spoke with pt's husband. Advised him that there are a year's worth of refills on the Tikosyn. He stated that copay is over $300 and they will not be able to afford it until next week. Will leave samples up front to cover pt until she can pick up refill from pharmacy.

## 2015-05-24 NOTE — Telephone Encounter (Signed)
New message      Patient calling the office for samples of medication:   1.  What medication and dosage are you requesting samples for? tikosyn 125mg   2.  Are you currently out of this medication? Yes---pt is out of medication

## 2015-05-28 ENCOUNTER — Other Ambulatory Visit: Payer: Self-pay

## 2015-05-28 NOTE — Patient Outreach (Signed)
[facility]) Care Management  [place]  05/28/2015   Donna [name] 11-26-1935 [id number]  Subjective: Telephone call to patient for monthly outreach.  Patient states she is staying busy. Patient reports weight at 104 lbs.  Patient reports she is supposed to go back to breast center for more testing due area to left breast. Discussed with patient heart failure zone chart and the importance of using as a guide to daily maintenance.  She verbalized understanding.    Objective:   Encounter Medications:  Outpatient Encounter Prescriptions as of 05/28/2015  Medication Sig Note  . acetaminophen-codeine (TYLENOL #3) 300-30 MG per tablet Take 1-2 tablets by mouth every 6 (six) hours as needed for moderate pain.   . B Complex-C (SUPER B COMPLEX PO) Take 1 tablet by mouth daily.   . carvedilol (COREG) 6.25 MG tablet Take 1 tablet (6.25 mg total) by mouth 2 (two) times daily.   Marland Kitchen dofetilide (TIKOSYN) 125 MCG capsule Take 3 capsules (375 mcg total) by mouth 2 (two) times daily.   Marland Kitchen ELIQUIS 5 MG TABS tablet TAKE ONE TABLET BY MOUTH TWICE DAILY   . ESTRACE VAGINAL 0.1 MG/GM vaginal cream Place 1 Applicatorful vaginally every Monday, Wednesday, and Friday.  02/15/2015: Husband reports taking twice a week.   . furosemide (LASIX) 20 MG tablet Take one tablet (20 mg) by mouth ever other day on Monday, Wednesday, Friday   . magnesium oxide (MAG-OX) 400 (241.3 Mg) MG tablet Take 1 tablet (400 mg total) by mouth every morning.   . mirtazapine (REMERON) 15 MG tablet Take 15 mg by mouth at bedtime.   . Multiple Vitamin (MULTIVITAMIN PO) Take 1 tablet by mouth daily.    . nitroGLYCERIN (NITROSTAT) 0.4 MG SL tablet Place 1 tablet (0.4 mg total) under the tongue every 5 (five) minutes as needed for chest pain.   . potassium chloride SA (KLOR-CON M20) 20 MEQ tablet Take 1 tablet (20 mEq total) by mouth daily.    No facility-administered encounter medications on file as of 05/28/2015.     Functional Status:  In your present state of health, do you have any difficulty performing the following activities: 03/27/2015 03/06/2015  Hearing? N N  Vision? N N  Difficulty concentrating or making decisions? N N  Walking or climbing stairs? N N  Dressing or bathing? N N  Doing errands, shopping? N N  Preparing Food and eating ? N N  Using the Toilet? N N  In the past six months, have you accidently leaked urine? N N  Do you have problems with loss of bowel control? N N  Managing your Medications? N N  Managing your Finances? N N  Housekeeping or managing your Housekeeping? N N    Fall/Depression Screening: PHQ 2/9 Scores 04/24/2015 03/27/2015 03/06/2015 02/15/2015 02/13/2015 02/01/2015  PHQ - 2 Score 0 0 0 0 0 0    Assessment: Patient continues to benefit from health coach outreach for disease management and support.    Plan:  [facility] CM Care Plan Problem One        Most Recent Value   Care Plan Problem One  knowledge deficit related to heart failure   Role Documenting the Problem One  [place] for Problem One  Active   THN Long Term Goal (31-90 days)  Patient will verbalize following heart failure action plan as a daily guide to maintenance within 90 days.    THN Long Term Goal Start Date  03/27/15 [goal continued]   Interventions for Problem One Long Term Goal  RN Health Coach reiterated with patient/caregiver heart failure action plan.     THN CM Care Plan Problem Two        Most Recent Value   Care Plan Problem Two  weight loss   Role Documenting the Problem Two  [place] for Problem Two  Active   THN CM Short Term Goal #1 (0-30 days)  Patient will monitor weight at least weekly within the next 30 days.   THN CM Short Term Goal #1 Start Date  05/28/15 [goal continued]   Interventions for Short Term Goal #2   RN Health Coach reiterated with patient importance of weights. Also reviewed use of supplements and eating a balanced diet.     RN  Health Coach will contact patient within one month and patient agrees to next outreach.    Jone Baseman, RN, MSN [place] [phone]

## 2015-06-06 DIAGNOSIS — F4323 Adjustment disorder with mixed anxiety and depressed mood: Secondary | ICD-10-CM | POA: Diagnosis not present

## 2015-06-21 ENCOUNTER — Ambulatory Visit: Payer: Medicare Other | Admitting: Interventional Cardiology

## 2015-06-21 ENCOUNTER — Other Ambulatory Visit: Payer: Self-pay

## 2015-06-21 NOTE — Patient Outreach (Signed)
[facility]) Care Management  [place]  06/21/2015   ELLEAH DELLING [birth year] [id number]  Subjective: Telephone call to patient for monthly call. Patient reports she is doing good. Patient reports her weight is around 100 lbs. She reports she is trying to eat more to gain some weight. Discussed drinking supplements to give extra calories. She verbalized understanding. Discussed with patient signs and symptoms of heart failure and when to notify physician.  She verbalized understanding.    Objective:   Encounter Medications:  Outpatient Encounter Prescriptions as of 06/21/2015  Medication Sig Note  . acetaminophen-codeine (TYLENOL #3) 300-30 MG per tablet Take 1-2 tablets by mouth every 6 (six) hours as needed for moderate pain.   . B Complex-C (SUPER B COMPLEX PO) Take 1 tablet by mouth daily.   . carvedilol (COREG) 6.25 MG tablet Take 1 tablet (6.25 mg total) by mouth 2 (two) times daily.   Marland Kitchen dofetilide (TIKOSYN) 125 MCG capsule Take 3 capsules (375 mcg total) by mouth 2 (two) times daily.   Marland Kitchen ELIQUIS 5 MG TABS tablet TAKE ONE TABLET BY MOUTH TWICE DAILY   . ESTRACE VAGINAL 0.1 MG/GM vaginal cream Place 1 Applicatorful vaginally every Monday, Wednesday, and Friday.  02/15/2015: Husband reports taking twice a week.   . furosemide (LASIX) 20 MG tablet Take one tablet (20 mg) by mouth ever other day on Monday, Wednesday, Friday   . magnesium oxide (MAG-OX) 400 (241.3 Mg) MG tablet Take 1 tablet (400 mg total) by mouth every morning.   . mirtazapine (REMERON) 15 MG tablet Take 15 mg by mouth at bedtime.   . Multiple Vitamin (MULTIVITAMIN PO) Take 1 tablet by mouth daily.    . nitroGLYCERIN (NITROSTAT) 0.4 MG SL tablet Place 1 tablet (0.4 mg total) under the tongue every 5 (five) minutes as needed for chest pain.   . potassium chloride SA (KLOR-CON M20) 20 MEQ tablet Take 1 tablet (20 mEq total) by mouth daily.    No facility-administered encounter medications on file  as of 06/21/2015.    Functional Status:  In your present state of health, do you have any difficulty performing the following activities: 03/27/2015 03/06/2015  Hearing? N N  Vision? N N  Difficulty concentrating or making decisions? N N  Walking or climbing stairs? N N  Dressing or bathing? N N  Doing errands, shopping? N N  Preparing Food and eating ? N N  Using the Toilet? N N  In the past six months, have you accidently leaked urine? N N  Do you have problems with loss of bowel control? N N  Managing your Medications? N N  Managing your Finances? N N  Housekeeping or managing your Housekeeping? N N    Fall/Depression Screening: PHQ 2/9 Scores 04/24/2015 03/27/2015 03/06/2015 02/15/2015 02/13/2015 02/01/2015  PHQ - 2 Score 0 0 0 0 0 0    Assessment: Patient continues to benefit from health coach outreach for disease management and support.    Plan:  [facility] CM Care Plan Problem One        Most Recent Value   Care Plan Problem One  knowledge deficit related to heart failure   Role Documenting the Problem One  [place] for Problem One  Active   THN Long Term Goal (31-90 days)  Patient will verbalize following heart failure action plan as a daily guide to maintenance within 90 days.    THN Long Term Goal Start Date  06/21/15 [goal continued]   Interventions for Problem One [place] reviewed with patient/caregiver heart failure action plan.     THN CM Care Plan Problem Two        Most Recent Value   Care Plan Problem Two  weight loss   Role Documenting the Problem Two  [place] for Problem Two  Active   THN CM Short Term Goal #1 (0-30 days)  Patient will monitor weight at least weekly within the next 30 days.   THN CM Short Term Goal #1 Start Date  06/21/15 [goal continued]   Interventions for Short Term Goal #2   [place] reviewed with patient importance of weights. Also reviewed use of supplements and eating a balanced diet.      RN Health Coach will contact patient within one month and patient agrees to next outreach.  Jone Baseman, RN, MSN [place] [phone]

## 2015-06-26 NOTE — Progress Notes (Signed)
Cardiology Office Note    Date:  06/27/2015   ID:  Donna [name], DOB 08/07/1935, MRN [id number]  PCP:  Henrine Screws, MD  Cardiologist: Sinclair Grooms, MD   Chief Complaint  Patient presents with  . Atrial Fibrillation    History of Present Illness:  Donna [name] is a 80 y.o. female atherosclerotic kidney disease, hypertension, paroxysmal atrial fibrillation, chronic diastolic heart failure when in A. fib, and chronic anticoagulation therapy.  Major concern is the expensive medication. This is particularly true of dofetilide. Every 2 weeks it is costing nearly $400. She denies orthopnea, PND, syncope, and tachycardia palpitations. Is no peripheral edema.  No blood in the urine or stool.    Past Medical History  Diagnosis Date  . Essential hypertension   . Atherosclerosis of renal artery (HCC)     a. s/p R RA stenting;  b. 04/2014 Renal Art duplex: Patent R RA stent, <60% L RA stenosis. Followed by [facility].  . Fibrocystic breast   . Fibromuscular dysplasia ([place])   . Carotid arterial disease ([place])     a. 01/2014 Carotid U/S: RICA [id number], LICA AB-[id number]. Followed by [facility].  . Paroxysmal atrial fibrillation ([place])     a. Dx 07/26/2014, CHA2DS2VASc = 5-->Eliquis;  c. 08/2014 Echo: EF 55-60%, no rwma, mildly dil LA/RA, mod-sev TR, PASP 50mmHg. b. s/p DCCV 08/2014. c. back in atrial flutter 09/2014 -> rate control pursued.  . Paroxysmal atrial flutter ([place])     a. Dx 07/26/2014, CHA2DS2VASc = 5-->Eliquis;  c. 08/2014 Echo: EF 55-60%, no rwma, mildly dil LA/RA, mod-sev TR, PASP 57mmHg. b. s/p DCCV 08/2014. c. back in atrial flutter 09/2014 -> rate control pursued.  . CKD (chronic kidney disease), stage III     Stage II/III  . Tricuspid regurgitation     a. Echo 08/2014: mod-severe.  . Mitral regurgitation     a. Echo 08/2014: mild MR.  Marland Kitchen Hyperglycemia     Past Surgical History  Procedure Laterality Date  . Renal artery stent  2008    Right renal artery by Dr. Drucie Opitz  . Hemorroidectomy     . Tubal ligation  1970's  . Tonsillectomy    . Cardioversion N/A 09/01/2014    Procedure: CARDIOVERSION;  Surgeon: Josue Hector, MD;  Location: [facility] ENDOSCOPY;  Service: Cardiovascular;  Laterality: N/A;  . Cardioversion N/A 11/23/2014    Procedure: CARDIOVERSION;  Surgeon: Skeet Latch, MD;  Location: [facility] ENDOSCOPY;  Service: Cardiovascular;  Laterality: N/A;    Current Medications: Outpatient Prescriptions Prior to Visit  Medication Sig Dispense Refill  . acetaminophen-codeine (TYLENOL #3) 300-30 MG per tablet Take 1-2 tablets by mouth every 6 (six) hours as needed for moderate pain. 15 tablet 0  . B Complex-C (SUPER B COMPLEX PO) Take 1 tablet by mouth daily.    . carvedilol (COREG) 6.25 MG tablet Take 1 tablet (6.25 mg total) by mouth 2 (two) times daily. 180 tablet 3  . dofetilide (TIKOSYN) 125 MCG capsule Take 3 capsules (375 mcg total) by mouth 2 (two) times daily. 90 capsule 11  . ELIQUIS 5 MG TABS tablet TAKE ONE TABLET BY MOUTH TWICE DAILY 60 tablet 9  . ESTRACE VAGINAL 0.1 MG/GM vaginal cream Place 1 Applicatorful vaginally every Monday, Wednesday, and Friday.     . furosemide (LASIX) 20 MG tablet Take one tablet (20 mg) by mouth ever other day on Monday, Wednesday, Friday 90 tablet 1  . magnesium oxide (MAG-OX) 400 (241.3 Mg) MG  tablet Take 1 tablet (400 mg total) by mouth every morning. 30 tablet 11  . mirtazapine (REMERON) 15 MG tablet Take 15 mg by mouth at bedtime.    . Multiple Vitamin (MULTIVITAMIN PO) Take 1 tablet by mouth daily.     . nitroGLYCERIN (NITROSTAT) 0.4 MG SL tablet Place 1 tablet (0.4 mg total) under the tongue every 5 (five) minutes as needed for chest pain. 25 tablet 3  . potassium chloride SA (KLOR-CON M20) 20 MEQ tablet Take 1 tablet (20 mEq total) by mouth daily. 90 tablet 3   No facility-administered medications prior to visit.     Allergies:   Tagamet   Social History   Social History  . Marital Status: Married    Spouse Name: N/A  .  Number of Children: N/A  . Years of Education: N/A   Social History Main Topics  . Smoking status: Never Smoker   . Smokeless tobacco: Never Used  . Alcohol Use: Yes  . Drug Use: No  . Sexual Activity: Yes    Birth Control/ Protection: Surgical     Comment: BTL   Other Topics Concern  . None   Social History Narrative     Family History:  The patient's family history includes Aneurysm in her father; Cancer (age of onset: 59) in her sister; Diabetes in her father and mother; Heart attack in her father; Hypertension in her father and mother; Stroke (age of onset: 38) in her mother.   ROS:   Please see the history of present illness.    Unexplained weight loss.  All other systems reviewed and are negative.   PHYSICAL EXAM:   VS:  BP 162/80 mmHg  Pulse 55  Ht 5\' 2"  (1.575 m)  Wt 105 lb (47.628 kg)  BMI 19.20 kg/m2  SpO2 99%   GEN: Well nourished, well developed, in no acute distress HEENT: normal Neck: no JVD, carotid bruits, or masses Cardiac: RRR; no murmurs, rubs, or gallops,no edema  Respiratory:  clear to auscultation bilaterally, normal work of breathing GI: soft, nontender, nondistended, + BS MS: no deformity or atrophy Skin: warm and dry, no rash Neuro:  Alert and Oriented x 3, Strength and sensation are intact Psych: euthymic mood, full affect  Wt Readings from Last 3 Encounters:  06/27/15 105 lb (47.628 kg)  05/04/15 105 lb (47.628 kg)  02/27/15 107 lb (48.535 kg)      Studies/Labs Reviewed:   EKG:  EKG  Is not performed today.  Recent Labs: 07/25/2014: ALT 48 11/19/2014: B Natriuretic Peptide 1196.4*; TSH 0.443 12/06/2014: Hemoglobin 13.0; Platelets 204 03/23/2015: BUN 28*; Creat 0.80; Magnesium 1.9; Potassium 4.5; Sodium 139   Lipid Panel No results found for: CHOL, TRIG, HDL, CHOLHDL, VLDL, LDLCALC, LDLDIRECT  Additional studies/ records that were reviewed today include:  No new studies. Most recent laboratory data as noted  above.    ASSESSMENT:    1. PAF (paroxysmal atrial fibrillation) ([place])   2. Essential hypertension   3. Atherosclerotic RAS (renal artery stenosis), bilateral (HCC)   4. Chronic anticoagulation      PLAN:  In order of problems listed above:  1. Complaining of the cost associated with dofetilide and Eliquis. We discussed switching to Coumadin. No decision made today. Basic metabolic panel in one week. 2. Elevated and needs better control. We'll increase furosemide to 20 mg daily. 4.   Consider switch to Coumadin.    Medication Adjustments/Labs and Tests Ordered: Current medicines are reviewed at length with the  patient today.  Concerns regarding medicines are outlined above.  Medication changes, Labs and Tests ordered today are listed in the Patient Instructions below. There are no Patient Instructions on file for this visit.   Signed, Sinclair Grooms, MD  06/27/2015 8:13 AM    [facility] [place], [place], [place]  [address] Phone: [phone]; Fax: [phone]

## 2015-06-27 ENCOUNTER — Encounter: Payer: Self-pay | Admitting: Interventional Cardiology

## 2015-06-27 ENCOUNTER — Ambulatory Visit (INDEPENDENT_AMBULATORY_CARE_PROVIDER_SITE_OTHER): Payer: Medicare Other | Admitting: Interventional Cardiology

## 2015-06-27 VITALS — BP 162/80 | HR 55 | Ht 62.0 in | Wt 105.0 lb

## 2015-06-27 DIAGNOSIS — I480 Paroxysmal atrial fibrillation: Secondary | ICD-10-CM | POA: Diagnosis not present

## 2015-06-27 DIAGNOSIS — Z7901 Long term (current) use of anticoagulants: Secondary | ICD-10-CM | POA: Diagnosis not present

## 2015-06-27 DIAGNOSIS — I10 Essential (primary) hypertension: Secondary | ICD-10-CM | POA: Diagnosis not present

## 2015-06-27 DIAGNOSIS — I701 Atherosclerosis of renal artery: Secondary | ICD-10-CM | POA: Diagnosis not present

## 2015-06-27 MED ORDER — FUROSEMIDE 20 MG PO TABS: 20.0000 mg | ORAL_TABLET | Freq: Every day | ORAL | Status: DC

## 2015-06-27 NOTE — Patient Instructions (Signed)
Medication Instructions:   INCREASE FUROSEMIDE TO 20 MG ONCE DAILY  Labwork:  Your physician recommends that you return for lab work in: [place]:  Your physician wants you to follow-up in: [place] will receive a reminder letter in the mail two months in advance. If you don't receive a letter, please call our office to schedule the follow-up appointment.

## 2015-06-29 DIAGNOSIS — I4891 Unspecified atrial fibrillation: Secondary | ICD-10-CM | POA: Diagnosis not present

## 2015-06-29 DIAGNOSIS — I10 Essential (primary) hypertension: Secondary | ICD-10-CM | POA: Diagnosis not present

## 2015-06-29 DIAGNOSIS — R413 Other amnesia: Secondary | ICD-10-CM | POA: Diagnosis not present

## 2015-06-29 DIAGNOSIS — I509 Heart failure, unspecified: Secondary | ICD-10-CM | POA: Diagnosis not present

## 2015-07-04 ENCOUNTER — Other Ambulatory Visit: Payer: Medicare Other

## 2015-07-05 ENCOUNTER — Other Ambulatory Visit (INDEPENDENT_AMBULATORY_CARE_PROVIDER_SITE_OTHER): Payer: Medicare Other | Admitting: *Deleted

## 2015-07-05 DIAGNOSIS — I10 Essential (primary) hypertension: Secondary | ICD-10-CM

## 2015-07-05 LAB — BASIC METABOLIC PANEL
BUN: 27 mg/dL — AB (ref 7–25)
CALCIUM: 9.9 mg/dL (ref 8.6–10.4)
CO2: 31 mmol/L (ref 20–31)
Chloride: 104 mmol/L (ref 98–110)
Creat: 1.06 mg/dL — ABNORMAL HIGH (ref 0.60–0.93)
GLUCOSE: 113 mg/dL — AB (ref 65–99)
Potassium: 4.2 mmol/L (ref 3.5–5.3)
SODIUM: 143 mmol/L (ref 135–146)

## 2015-07-05 NOTE — Addendum Note (Signed)
Addended by: Eulis Foster on: 07/05/2015 11:48 AM   Modules accepted: Orders

## 2015-07-18 ENCOUNTER — Other Ambulatory Visit: Payer: Self-pay

## 2015-07-18 NOTE — Patient Outreach (Signed)
[facility]) Care Management  [place]  07/18/2015   Donna [name] 27-Aug-1935 [id number]  Subjective: Telephone call to patient for monthly call.  Patient reports she is doing good.  She reports her weight is 106 lbs today. Patient trying to gain weight.  Patient reports she has not been a big eater but is drinking supplements to assist.  Discussed with patient heart failure zones and when to notify physician.  She verbalized understanding.   Objective:   Encounter Medications:  Outpatient Encounter Prescriptions as of 07/18/2015  Medication Sig Note  . acetaminophen-codeine (TYLENOL #3) 300-30 MG per tablet Take 1-2 tablets by mouth every 6 (six) hours as needed for moderate pain.   . B Complex-C (SUPER B COMPLEX PO) Take 1 tablet by mouth daily.   . carvedilol (COREG) 6.25 MG tablet Take 1 tablet (6.25 mg total) by mouth 2 (two) times daily.   Marland Kitchen dofetilide (TIKOSYN) 125 MCG capsule Take 3 capsules (375 mcg total) by mouth 2 (two) times daily.   Marland Kitchen ELIQUIS 5 MG TABS tablet TAKE ONE TABLET BY MOUTH TWICE DAILY   . ESTRACE VAGINAL 0.1 MG/GM vaginal cream Place 1 Applicatorful vaginally every Monday, Wednesday, and Friday.  02/15/2015: Husband reports taking twice a week.   . furosemide (LASIX) 20 MG tablet Take 1 tablet (20 mg total) by mouth daily.   . magnesium oxide (MAG-OX) 400 (241.3 Mg) MG tablet Take 1 tablet (400 mg total) by mouth every morning.   . mirtazapine (REMERON) 15 MG tablet Take 15 mg by mouth at bedtime.   . Multiple Vitamin (MULTIVITAMIN PO) Take 1 tablet by mouth daily.    . nitroGLYCERIN (NITROSTAT) 0.4 MG SL tablet Place 1 tablet (0.4 mg total) under the tongue every 5 (five) minutes as needed for chest pain.   . potassium chloride SA (KLOR-CON M20) 20 MEQ tablet Take 1 tablet (20 mEq total) by mouth daily.    No facility-administered encounter medications on file as of 07/18/2015.    Functional Status:  In your present state of health, do  you have any difficulty performing the following activities: 03/27/2015 03/06/2015  Hearing? N N  Vision? N N  Difficulty concentrating or making decisions? N N  Walking or climbing stairs? N N  Dressing or bathing? N N  Doing errands, shopping? N N  Preparing Food and eating ? N N  Using the Toilet? N N  In the past six months, have you accidently leaked urine? N N  Do you have problems with loss of bowel control? N N  Managing your Medications? N N  Managing your Finances? N N  Housekeeping or managing your Housekeeping? N N    Fall/Depression Screening: PHQ 2/9 Scores 04/24/2015 03/27/2015 03/06/2015 02/15/2015 02/13/2015 02/01/2015  PHQ - 2 Score 0 0 0 0 0 0    Assessment: Patient continues to benefit from health coach outreach for disease management and support.    Plan:  [facility] CM Care Plan Problem One        Most Recent Value   Care Plan Problem One  knowledge deficit related to heart failure   Role Documenting the Problem One  [place] for Problem One  Active   THN Long Term Goal (31-90 days)  Patient will verbalize following heart failure action plan as a daily guide to maintenance within 90 days.    THN Long Term Goal Start Date  06/21/15 [goal continued]   Interventions for  Problem One [place] reinforced with patient/caregiver heart failure action plan.     THN CM Care Plan Problem Two        Most Recent Value   Care Plan Problem Two  weight loss   Role Documenting the Problem Two  [place] for Problem Two  Active   THN CM Short Term Goal #1 (0-30 days)  Patient will monitor weight at least weekly within the next 30 days.   THN CM Short Term Goal #1 Start Date  07/18/15 [place] continued]   Interventions for Short Term Goal #2   RN Health Coach reinforced with patient importance of weights. Also reviewed use of supplements and eating a balanced diet.     RN Health Coach will contact patient in the month of August and  patient agrees to next outreach.  Jone Baseman, RN, MSN [place] [phone]

## 2015-08-03 DIAGNOSIS — R413 Other amnesia: Secondary | ICD-10-CM | POA: Diagnosis not present

## 2015-08-08 ENCOUNTER — Other Ambulatory Visit: Payer: Self-pay

## 2015-08-08 DIAGNOSIS — I480 Paroxysmal atrial fibrillation: Secondary | ICD-10-CM

## 2015-08-08 MED ORDER — DOFETILIDE 125 MCG PO CAPS: 375.0000 ug | ORAL_CAPSULE | Freq: Two times a day (BID) | ORAL | 0 refills | Status: DC

## 2015-08-08 NOTE — Telephone Encounter (Signed)
Daryl Eastern, MD at 06/26/2015 6:01 PM  dofetilide (TIKOSYN) 125 MCG capsule Take 3 capsules (375 mcg total) by mouth 2 (two) times daily   PLAN:  In order of problems listed above:  1. Complaining of the cost associated with dofetilide and Eliquis. We discussed switching to Coumadin. No decision made today. Basic metabolic panel in one week.

## 2015-08-16 ENCOUNTER — Other Ambulatory Visit: Payer: Self-pay

## 2015-08-16 NOTE — Patient Outreach (Signed)
[facility]) Care Management  [place]  08/16/2015   Donna [name] [birth year] [id number]  Subjective: Telephone call to patient for monthly call. Patient reports she is doing great.  She reports she continues to try to eat well so that she can gain some weight.  Patient has not weighed yet this morning.  Patient denies swelling or increased shortness of breath.  Discussed with patient signs and symptoms of heart failure and when to notify physician.  She verbalized understanding. No concerns.   Objective:   Encounter Medications:  Outpatient Encounter Prescriptions as of 08/16/2015  Medication Sig Note  . acetaminophen-codeine (TYLENOL #3) 300-30 MG per tablet Take 1-2 tablets by mouth every 6 (six) hours as needed for moderate pain.   . B Complex-C (SUPER B COMPLEX PO) Take 1 tablet by mouth daily.   . carvedilol (COREG) 6.25 MG tablet Take 1 tablet (6.25 mg total) by mouth 2 (two) times daily.   Marland Kitchen dofetilide (TIKOSYN) 125 MCG capsule Take 3 capsules (375 mcg total) by mouth 2 (two) times daily.   Marland Kitchen ELIQUIS 5 MG TABS tablet TAKE ONE TABLET BY MOUTH TWICE DAILY   . ESTRACE VAGINAL 0.1 MG/GM vaginal cream Place 1 Applicatorful vaginally every Monday, Wednesday, and Friday.  02/15/2015: Husband reports taking twice a week.   . furosemide (LASIX) 20 MG tablet Take 1 tablet (20 mg total) by mouth daily.   . magnesium oxide (MAG-OX) 400 (241.3 Mg) MG tablet Take 1 tablet (400 mg total) by mouth every morning.   . mirtazapine (REMERON) 15 MG tablet Take 15 mg by mouth at bedtime.   . Multiple Vitamin (MULTIVITAMIN PO) Take 1 tablet by mouth daily.    . nitroGLYCERIN (NITROSTAT) 0.4 MG SL tablet Place 1 tablet (0.4 mg total) under the tongue every 5 (five) minutes as needed for chest pain.   . potassium chloride SA (KLOR-CON M20) 20 MEQ tablet Take 1 tablet (20 mEq total) by mouth daily.    No facility-administered encounter medications on file as of 08/16/2015.      Functional Status:  In your present state of health, do you have any difficulty performing the following activities: 03/27/2015 03/06/2015  Hearing? N N  Vision? N N  Difficulty concentrating or making decisions? N N  Walking or climbing stairs? N N  Dressing or bathing? N N  Doing errands, shopping? N N  Preparing Food and eating ? N N  Using the Toilet? N N  In the past six months, have you accidently leaked urine? N N  Do you have problems with loss of bowel control? N N  Managing your Medications? N N  Managing your Finances? N N  Housekeeping or managing your Housekeeping? N N  Some recent data might be hidden    Fall/Depression Screening: PHQ 2/9 Scores 04/24/2015 03/27/2015 03/06/2015 02/15/2015 02/13/2015 02/01/2015  PHQ - 2 Score 0 0 0 0 0 0    Assessment: Patient continues to benefit from health coach outreach for disease management and support.    Plan:  [facility] CM Care Plan Problem One   Flowsheet Row Most Recent Value  Care Plan Problem One  knowledge deficit related to heart failure  Role Documenting the Problem One  [place] for Problem One  Active  THN Long Term Goal (31-90 days)  Patient will verbalize following heart failure action plan as a daily guide to maintenance within 90 days.   THN Long Term Goal Start Date  08/16/15 [goal continued]  Interventions for Problem One [place] reviewed with patient/caregiver heart failure action plan.     THN CM Care Plan Problem Two   Flowsheet Row Most Recent Value  Care Plan Problem Two  weight loss  Role Documenting the Problem Two  [place] for Problem Two  Active  THN CM Short Term Goal #1 (0-30 days)  Patient will monitor weight at least weekly within the next 30 days.  THN CM Short Term Goal #1 Start Date  08/16/15 [goal continued]  Interventions for Short Term Goal #2   [place] reviewed with patient importance of weights. Also reviewed use of supplements and  eating a balanced diet.     RN Health Coach will contact patient in the month of September and patient agrees to next outreach.  Jone Baseman, RN, MSN [place] [phone]

## 2015-08-17 DIAGNOSIS — I480 Paroxysmal atrial fibrillation: Secondary | ICD-10-CM | POA: Diagnosis not present

## 2015-08-17 DIAGNOSIS — I130 Hypertensive heart and chronic kidney disease with heart failure and stage 1 through stage 4 chronic kidney disease, or unspecified chronic kidney disease: Secondary | ICD-10-CM | POA: Diagnosis not present

## 2015-08-17 DIAGNOSIS — N189 Chronic kidney disease, unspecified: Secondary | ICD-10-CM | POA: Diagnosis not present

## 2015-08-17 DIAGNOSIS — M19032 Primary osteoarthritis, left wrist: Secondary | ICD-10-CM | POA: Diagnosis not present

## 2015-08-17 DIAGNOSIS — I509 Heart failure, unspecified: Secondary | ICD-10-CM | POA: Diagnosis not present

## 2015-08-17 DIAGNOSIS — F0390 Unspecified dementia without behavioral disturbance: Secondary | ICD-10-CM | POA: Diagnosis not present

## 2015-08-20 DIAGNOSIS — I499 Cardiac arrhythmia, unspecified: Secondary | ICD-10-CM | POA: Diagnosis not present

## 2015-08-22 DIAGNOSIS — I480 Paroxysmal atrial fibrillation: Secondary | ICD-10-CM | POA: Diagnosis not present

## 2015-08-22 DIAGNOSIS — N189 Chronic kidney disease, unspecified: Secondary | ICD-10-CM | POA: Diagnosis not present

## 2015-08-22 DIAGNOSIS — F0390 Unspecified dementia without behavioral disturbance: Secondary | ICD-10-CM | POA: Diagnosis not present

## 2015-08-22 DIAGNOSIS — I509 Heart failure, unspecified: Secondary | ICD-10-CM | POA: Diagnosis not present

## 2015-08-22 DIAGNOSIS — I130 Hypertensive heart and chronic kidney disease with heart failure and stage 1 through stage 4 chronic kidney disease, or unspecified chronic kidney disease: Secondary | ICD-10-CM | POA: Diagnosis not present

## 2015-08-22 DIAGNOSIS — M19032 Primary osteoarthritis, left wrist: Secondary | ICD-10-CM | POA: Diagnosis not present

## 2015-08-23 DIAGNOSIS — Z1231 Encounter for screening mammogram for malignant neoplasm of breast: Secondary | ICD-10-CM | POA: Diagnosis not present

## 2015-08-24 ENCOUNTER — Other Ambulatory Visit: Payer: Self-pay

## 2015-08-24 ENCOUNTER — Ambulatory Visit
Admission: RE | Admit: 2015-08-24 | Discharge: 2015-08-24 | Disposition: A | Discharge status: Home / Self Care | Payer: Medicare Other | Source: Ambulatory Visit

## 2015-08-24 DIAGNOSIS — I480 Paroxysmal atrial fibrillation: Secondary | ICD-10-CM | POA: Diagnosis not present

## 2015-08-24 DIAGNOSIS — Z1231 Encounter for screening mammogram for malignant neoplasm of breast: Secondary | ICD-10-CM | POA: Diagnosis not present

## 2015-08-24 DIAGNOSIS — I130 Hypertensive heart and chronic kidney disease with heart failure and stage 1 through stage 4 chronic kidney disease, or unspecified chronic kidney disease: Secondary | ICD-10-CM | POA: Diagnosis not present

## 2015-08-24 DIAGNOSIS — M19032 Primary osteoarthritis, left wrist: Secondary | ICD-10-CM | POA: Diagnosis not present

## 2015-08-24 DIAGNOSIS — I10 Essential (primary) hypertension: Secondary | ICD-10-CM | POA: Diagnosis not present

## 2015-08-24 DIAGNOSIS — F418 Other specified anxiety disorders: Secondary | ICD-10-CM | POA: Diagnosis not present

## 2015-08-24 DIAGNOSIS — N189 Chronic kidney disease, unspecified: Secondary | ICD-10-CM | POA: Diagnosis not present

## 2015-08-24 DIAGNOSIS — I509 Heart failure, unspecified: Secondary | ICD-10-CM | POA: Diagnosis not present

## 2015-08-24 DIAGNOSIS — F0390 Unspecified dementia without behavioral disturbance: Secondary | ICD-10-CM | POA: Diagnosis not present

## 2015-08-24 NOTE — Patient Outreach (Signed)
[facility]) Care Management  08/24/2015  ELISAMA CRISOLOGO Mar 27, 1935 [id number]   Telephone call to patient for follow up on call to her OB about her primary doctor.  Spoke with patient and her husband. Patient reports that her OB was sending her to a psychiatrist and she called the psychiatrist to see why she was going.  Husband states that patient has been fine and he has seen no problems with patient.  He shares however that her blood pressure was up and that she will see the physician assistant at the [place] office today. Other than that both report that things are fine.    Plan: RN Health Coach will contact patient in the month of September and patient agrees to next outreach.

## 2015-08-28 DIAGNOSIS — N189 Chronic kidney disease, unspecified: Secondary | ICD-10-CM | POA: Diagnosis not present

## 2015-08-28 DIAGNOSIS — I509 Heart failure, unspecified: Secondary | ICD-10-CM | POA: Diagnosis not present

## 2015-08-28 DIAGNOSIS — I480 Paroxysmal atrial fibrillation: Secondary | ICD-10-CM | POA: Diagnosis not present

## 2015-08-28 DIAGNOSIS — F0390 Unspecified dementia without behavioral disturbance: Secondary | ICD-10-CM | POA: Diagnosis not present

## 2015-08-28 DIAGNOSIS — I130 Hypertensive heart and chronic kidney disease with heart failure and stage 1 through stage 4 chronic kidney disease, or unspecified chronic kidney disease: Secondary | ICD-10-CM | POA: Diagnosis not present

## 2015-08-28 DIAGNOSIS — M19032 Primary osteoarthritis, left wrist: Secondary | ICD-10-CM | POA: Diagnosis not present

## 2015-08-29 ENCOUNTER — Encounter (HOSPITAL_COMMUNITY): Payer: Self-pay | Admitting: *Deleted

## 2015-08-29 DIAGNOSIS — Z7901 Long term (current) use of anticoagulants: Secondary | ICD-10-CM | POA: Diagnosis not present

## 2015-08-29 DIAGNOSIS — R001 Bradycardia, unspecified: Secondary | ICD-10-CM | POA: Diagnosis not present

## 2015-08-29 DIAGNOSIS — R739 Hyperglycemia, unspecified: Secondary | ICD-10-CM | POA: Diagnosis not present

## 2015-08-29 DIAGNOSIS — I129 Hypertensive chronic kidney disease with stage 1 through stage 4 chronic kidney disease, or unspecified chronic kidney disease: Secondary | ICD-10-CM | POA: Diagnosis not present

## 2015-08-29 DIAGNOSIS — N183 Chronic kidney disease, stage 3 (moderate): Secondary | ICD-10-CM | POA: Diagnosis not present

## 2015-08-29 DIAGNOSIS — I130 Hypertensive heart and chronic kidney disease with heart failure and stage 1 through stage 4 chronic kidney disease, or unspecified chronic kidney disease: Secondary | ICD-10-CM | POA: Diagnosis not present

## 2015-08-29 DIAGNOSIS — I5033 Acute on chronic diastolic (congestive) heart failure: Secondary | ICD-10-CM | POA: Diagnosis not present

## 2015-08-29 LAB — URINALYSIS, ROUTINE W REFLEX MICROSCOPIC
Bilirubin Urine: NEGATIVE
GLUCOSE, UA: NEGATIVE mg/dL
KETONES UR: NEGATIVE mg/dL
NITRITE: NEGATIVE
PROTEIN: 30 mg/dL — AB
Specific Gravity, Urine: 1.029 (ref 1.005–1.030)
pH: 5.5 (ref 5.0–8.0)

## 2015-08-29 LAB — COMPREHENSIVE METABOLIC PANEL
ALBUMIN: 3.8 g/dL (ref 3.5–5.0)
ALK PHOS: 65 U/L (ref 38–126)
ALT: 25 U/L (ref 14–54)
ANION GAP: 5 (ref 5–15)
AST: 27 U/L (ref 15–41)
BILIRUBIN TOTAL: 0.6 mg/dL (ref 0.3–1.2)
BUN: 28 mg/dL — ABNORMAL HIGH (ref 6–20)
CALCIUM: 9.6 mg/dL (ref 8.9–10.3)
CO2: 26 mmol/L (ref 22–32)
Chloride: 108 mmol/L (ref 101–111)
Creatinine, Ser: 1.21 mg/dL — ABNORMAL HIGH (ref 0.44–1.00)
GFR, EST AFRICAN AMERICAN: 48 mL/min — AB (ref >60)
GFR, EST NON AFRICAN AMERICAN: 42 mL/min — AB (ref >60)
GLUCOSE: 204 mg/dL — AB (ref 65–99)
POTASSIUM: 4.4 mmol/L (ref 3.5–5.1)
Sodium: 139 mmol/L (ref 135–145)
TOTAL PROTEIN: 7 g/dL (ref 6.5–8.1)

## 2015-08-29 LAB — CBC
HEMATOCRIT: 42.1 % (ref 36.0–46.0)
HEMOGLOBIN: 13.9 g/dL (ref 12.0–15.0)
MCH: 32.5 pg (ref 26.0–34.0)
MCHC: 33 g/dL (ref 30.0–36.0)
MCV: 98.4 fL (ref 78.0–100.0)
Platelets: 190 10*3/uL (ref 150–400)
RBC: 4.28 MIL/uL (ref 3.87–5.11)
RDW: 13.9 % (ref 11.5–15.5)
WBC: 4.9 10*3/uL (ref 4.0–10.5)

## 2015-08-29 LAB — URINE MICROSCOPIC-ADD ON

## 2015-08-29 NOTE — ED Triage Notes (Signed)
Pt c/o hypertension. Checked bp at home, meter read 223/82. Pt denies headache, blurred vision, or dizziness. Reports being compliant with medications

## 2015-08-29 NOTE — Progress Notes (Addendum)
Cardiology Office Note    Date:  08/31/2015   ID:  [name], DOB 08/30/1935, MRN [id number]  PCP:  Henrine Screws, MD  Cardiologist:  Dr. Tamala Julian  CC: regular follow up and HTN  History of Present Illness:  Donna [name] is a 80 y.o. female with a history of HTN, renal artery stenosis as below (followed by [facility]), moderate carotid arterial disease 01/2014 (followed by [facility]), paroxysmal atrial fibrillation/flutter, tachy-brady, probable CKD stage II-III, valvular heart disease (mod-severe TR and mild MR by echo 08/2014) and chronic diastolic CHF (esp when afib) who presents to clinic for evaluation of HTN.   2D Echo 08/09/14: EF 55-60%, mild biatrial enlargement, mild MR, mod-severe TR, mild-mod elevation in pulm pressure. She was then seen in the ER 08/16/14 complaining of chest pain. Troponin was negative and chest pain was felt atypical by EDP thus she was discharged with plan for outpatient follow-up. Lexiscan nuclear stress test was done 08/2014 which was negative for ischemia. Was back in afib and diltiazem was added for elevated rate and she underwent DCCV 09/01/14. When seen back in the office on 09/27/14, she was back in atrial flutter with HR low 100s but was completely asymptomatic and rate control strategy was persued. She then had recurrent ER visits in the setting of afib and volume overload. A 24 hour holter monitor pn 11/16/14  showed (2) 3 second pauses with overall poor rate control with afib w/rvr.  She was admitted 11/12 thru 11/24/14 for acute on chronic diastolic heart failure and afib. She was diuresed and decision was made to start her on tikosyn for rhythm control. She was noted to have multiple pauses on telemetry. She was discharged on 375 mg of tikosyn. There was initially some confusion about whether or not she was taking her Tikosyn after discharge, but eventually it was clear that she had been on it. Diltiazem was discontinued   She last saw Dr. Tamala Julian on 06/26/15 for  follow up. She was doing well on Eliquis and Tikosyn but was upset about cost of medications. There was some discussion about switching her to coumadin, but not changes made. BP noted to be a little elevated and lasix was increased to 20mg  daily.   Seen in the ER on 08/29/15 PM for HTN. BP was 223/82. HR was noted to be in 93s. No changes made. Left to [place].    Today she presents to clinic for evaluation of HTN x 1 week. She has otherwise been doing quite well. She does feel a little tired when her BP gets a high. At home it has been consistently SBP in 180s. No LE edema, orthopnea, or PND. No palpitations. No dizziness or syncope. No chest pain. No headache. She stays "busy as a bee" doing everything she wants. She denies claudication.   Past Medical History:  Diagnosis Date  . Atherosclerosis of renal artery (HCC)    a. s/p R RA stenting;  b. 04/2014 Renal Art duplex: Patent R RA stent, <60% L RA stenosis. Followed by [facility].  . Carotid arterial disease ([place])    a. 01/2014 Carotid U/S: RICA [id number], LICA [id number]. Followed by [facility].  . CKD (chronic kidney disease), stage III    Stage II/III  . Essential hypertension   . Fibrocystic breast   . Fibromuscular dysplasia ([place])   . Hyperglycemia   . Mitral regurgitation    a. Echo 08/2014: mild MR.  . Paroxysmal atrial fibrillation ([place])    a. Dx 07/26/2014,  CHA2DS2VASc = 5-->Eliquis;  c. 08/2014 Echo: EF 55-60%, no rwma, mildly dil LA/RA, mod-sev TR, PASP 70mmHg. b. s/p DCCV 08/2014. c. back in atrial flutter 09/2014 -> rate control pursued.  . Paroxysmal atrial flutter ([place])    a. Dx 07/26/2014, CHA2DS2VASc = 5-->Eliquis;  c. 08/2014 Echo: EF 55-60%, no rwma, mildly dil LA/RA, mod-sev TR, PASP 51mmHg. b. s/p DCCV 08/2014. c. back in atrial flutter 09/2014 -> rate control pursued.  . Tricuspid regurgitation    a. Echo 08/2014: mod-severe.    Past Surgical History:  Procedure Laterality Date  . CARDIOVERSION N/A 09/01/2014   Procedure: CARDIOVERSION;  Surgeon: Josue Hector, MD;  Location: [facility] ENDOSCOPY;  Service: Cardiovascular;  Laterality: N/A;  . CARDIOVERSION N/A 11/23/2014   Procedure: CARDIOVERSION;  Surgeon: Skeet Latch, MD;  Location: [place];  Service: Cardiovascular;  Laterality: N/A;  . HEMORROIDECTOMY    . RENAL ARTERY STENT  2008   Right renal artery by Dr. Drucie Opitz  . TONSILLECTOMY    . TUBAL LIGATION  1970's    Current Medications: Outpatient Medications Prior to Visit  Medication Sig Dispense Refill  . acetaminophen-codeine (TYLENOL #3) 300-30 MG per tablet Take 1-2 tablets by mouth every 6 (six) hours as needed for moderate pain. 15 tablet 0  . B Complex-C (SUPER B COMPLEX PO) Take 1 tablet by mouth daily.    . carvedilol (COREG) 6.25 MG tablet Take 1 tablet (6.25 mg total) by mouth 2 (two) times daily. 180 tablet 3  . dofetilide (TIKOSYN) 125 MCG capsule Take 3 capsules (375 mcg total) by mouth 2 (two) times daily. 270 capsule 0  . ELIQUIS 5 MG TABS tablet TAKE ONE TABLET BY MOUTH TWICE DAILY 60 tablet 9  . ESTRACE VAGINAL 0.1 MG/GM vaginal cream Place 1 Applicatorful vaginally as needed.     . furosemide (LASIX) 20 MG tablet Take 1 tablet (20 mg total) by mouth daily. 90 tablet 1  . magnesium oxide (MAG-OX) 400 (241.3 Mg) MG tablet Take 1 tablet (400 mg total) by mouth every morning. 30 tablet 11  . mirtazapine (REMERON) 15 MG tablet Take 15 mg by mouth at bedtime.    . Multiple Vitamin (MULTIVITAMIN WITH MINERALS) TABS tablet Take 1 tablet by mouth daily.    . nitroGLYCERIN (NITROSTAT) 0.4 MG SL tablet Place 1 tablet (0.4 mg total) under the tongue every 5 (five) minutes as needed for chest pain. 25 tablet 3  . potassium chloride SA (KLOR-CON M20) 20 MEQ tablet Take 1 tablet (20 mEq total) by mouth daily. 90 tablet 3   No facility-administered medications prior to visit.      Allergies:   Tagamet [cimetidine]   Social History   Social History  . Marital status: Married    Spouse name: N/A  . Number of children:  N/A  . Years of education: N/A   Social History Main Topics  . Smoking status: Never Smoker  . Smokeless tobacco: Never Used  . Alcohol use Yes  . Drug use: No  . Sexual activity: Yes    Birth control/ protection: Surgical     Comment: BTL   Other Topics Concern  . None   Social History Narrative  . None     Family History:  The patient's family history includes Aneurysm in her father; Cancer (age of onset: 53) in her sister; Diabetes in her father and mother; Heart attack in her father; Hypertension in her father and mother; Stroke (age of onset: 57) in her  mother.     ROS:   Please see the history of present illness.    ROS All other systems reviewed and are negative.   PHYSICAL EXAM:   VS:  BP (!) 148/82   Pulse 66   Ht 5\' 1"  (1.549 m)   Wt 106 lb 6.4 oz (48.3 kg)   BMI 20.10 kg/m    GEN: Well nourished, well developed, in no acute distress  HEENT: normal  Neck: no JVD, carotid bruits, or masses Cardiac: RRR; no murmurs, rubs, or gallops,no edema  Respiratory:  clear to auscultation bilaterally, normal work of breathing GI: soft, nontender, nondistended, + BS MS: no deformity or atrophy  Skin: warm and dry, no rash Neuro:  Alert and Oriented x 3, Strength and sensation are intact Psych: euthymic mood, full affect  Wt Readings from Last 3 Encounters:  08/31/15 106 lb 6.4 oz (48.3 kg)  08/29/15 110 lb (49.9 kg)  06/27/15 105 lb (47.6 kg)      Studies/Labs Reviewed:   EKG:  EKG is NOT ordered today.    Recent Labs: 11/19/2014: B Natriuretic Peptide 1,196.4; TSH 0.443 03/23/2015: Magnesium 1.9 08/29/2015: ALT 25; BUN 28; Creatinine, Ser 1.21; Hemoglobin 13.9; Platelets 190; Potassium 4.4; Sodium 139   Lipid Panel No results found for: CHOL, TRIG, HDL, CHOLHDL, VLDL, LDLCALC, LDLDIRECT  Additional studies/ records that were reviewed today include:   Heart monitor: 11/2014 Study Highlights   Continuous atrial fibrillation throughout the duration of the  study.  Rapid ventricular response and poor overall rate control is noted  Pauses greater than 3 seconds noted on 2 occasions   Atrial fibrillation is continuous and has poor rate control. Pauses of up to 3.1 seconds are noted on 2 occasions.   08/28/14 Myoview Study Highlights    There was no ST segment deviation noted during stress.  This is a low risk study.   Perfusion imaging shows no evidence of ischemia or scar. Study was not gated because of the atrial fibrillation. Consider echocardiogram to assess LV systolic function if needed.     2D ECHO: 08/09/2014 LV EF: 55% -   60% Study Conclusions - Left ventricle: The cavity size was normal. Wall thickness was   increased in a pattern of mild LVH. Systolic function was normal.   The estimated ejection fraction was in the range of 55% to 60%.   Wall motion was normal; there were no regional wall motion   abnormalities. - Mitral valve: There was mild regurgitation. - Left atrium: The atrium was mildly dilated. - Right atrium: The atrium was mildly dilated. - Tricuspid valve: There was moderate-severe regurgitation. - Pulmonary arteries: Systolic pressure was mildly increased. PA   peak pressure: 39 mm Hg (S). - Pericardium, extracardiac: A trivial pericardial effusion was   identified. Impressions: - Normal LV function; mild biatrial enlargement; mild MR; moderate   to severe TR; mild to moderate elevation in pulmonary pressure.   ASSESSMENT & PLAN:    HTN: BP has been elevated all week. Seen in ER. No changes made. She likely has diabetes with a random blood glucose >200 in the ER, will order a HgA1c today. I was initially was going to add an ACE/ARB but was hesitant in the setting of bilateral renal artery stenosis. Will add amlodipine 5mg  daily and she will continue to watch her BP.   Chronic diastolic CHF: appears euvolemic. Continue lasix 20mg  daily   PAFib/flutter: maintaining NSR on Tikosn. Continue Eliquis  for CHADSVASC score of  4 (CHF, HTN, age)  CKD: recent lab work shows creat of 1.21  Hyperglycemia: will get HgA1C  Medication Adjustments/Labs and Tests Ordered: Current medicines are reviewed at length with the patient today.  Concerns regarding medicines are outlined above.  Medication changes, Labs and Tests ordered today are listed in the Patient Instructions below. Patient Instructions  Your physician has recommended you make the following change in your medication:  START  AMLODIPINE  5 MG  EVERY DAY   Your physician recommends that you return for lab work in: TODAY   HGB A1C   KEEP  B/P  LOG  IF  GREATER  THAN  140/90  [place]  Your physician recommends that you schedule a follow-up appointment in:   [place], Donna Form, PA-C  08/31/2015 9:25 AM    [facility] [place], [place], [place]  [address] Phone: [phone]; Fax: [phone]

## 2015-08-30 ENCOUNTER — Encounter: Payer: Self-pay | Admitting: Physician Assistant

## 2015-08-30 ENCOUNTER — Emergency Department (HOSPITAL_COMMUNITY)
Admission: EM | Admit: 2015-08-30 | Discharge: 2015-08-30 | Disposition: A | Discharge status: Home / Self Care | Payer: Medicare Other | Attending: Emergency Medicine | Admitting: Emergency Medicine

## 2015-08-30 DIAGNOSIS — N189 Chronic kidney disease, unspecified: Secondary | ICD-10-CM | POA: Diagnosis not present

## 2015-08-30 DIAGNOSIS — M19032 Primary osteoarthritis, left wrist: Secondary | ICD-10-CM | POA: Diagnosis not present

## 2015-08-30 DIAGNOSIS — F0390 Unspecified dementia without behavioral disturbance: Secondary | ICD-10-CM | POA: Diagnosis not present

## 2015-08-30 DIAGNOSIS — I509 Heart failure, unspecified: Secondary | ICD-10-CM | POA: Diagnosis not present

## 2015-08-30 DIAGNOSIS — R739 Hyperglycemia, unspecified: Secondary | ICD-10-CM

## 2015-08-30 DIAGNOSIS — I130 Hypertensive heart and chronic kidney disease with heart failure and stage 1 through stage 4 chronic kidney disease, or unspecified chronic kidney disease: Secondary | ICD-10-CM | POA: Diagnosis not present

## 2015-08-30 DIAGNOSIS — I10 Essential (primary) hypertension: Secondary | ICD-10-CM

## 2015-08-30 DIAGNOSIS — I480 Paroxysmal atrial fibrillation: Secondary | ICD-10-CM | POA: Diagnosis not present

## 2015-08-30 MED ORDER — AMLODIPINE BESYLATE 5 MG PO TABS
5.0000 mg | ORAL_TABLET | Freq: Once | ORAL | Status: AC
  Administered 2015-08-30: 5 mg via ORAL
  Filled 2015-08-30: qty 1

## 2015-08-30 NOTE — ED Provider Notes (Signed)
[place] DEPT Provider Note   CSN: [id number] Arrival date & time: 08/29/15  2123  By signing my name below, I, Donna [name], attest that this documentation has been prepared under the direction and in the presence of Donna Greek, MD . Electronically Signed: Higinio [name], Scribe. 08/30/2015. 1:47 AM.  History   Chief Complaint Chief Complaint  Patient presents with  . Hypertension   The history is provided by the patient. No language interpreter was used.   HPI Comments: Donna [name] is a 80 y.o. female with PMHx of A-Fib, CAD, CKD, hyperglycemia and HTN, who presents to the Emergency Department complaining of gradually worsening, hypertension that began 1 week ago and worsened today. Pt's husband reports he has been monitoring pt's BP for a week and states pt visited the ED tonight because it reached 223/82. Per husband, pt has been compliant with her medications and states there have been no recent changes to them. Pt denies headache, chest pain, blurry vision and dizziness.   Past Medical History:  Diagnosis Date  . Atherosclerosis of renal artery (HCC)    a. s/p R RA stenting;  b. 04/2014 Renal Art duplex: Patent R RA stent, <60% L RA stenosis. Followed by [facility].  . Carotid arterial disease ([place])    a. 01/2014 Carotid U/S: RICA [id number], LICA [id number]. Followed by [facility].  . CKD (chronic kidney disease), stage III    Stage II/III  . Essential hypertension   . Fibrocystic breast   . Fibromuscular dysplasia ([place])   . Hyperglycemia   . Mitral regurgitation    a. Echo 08/2014: mild MR.  . Paroxysmal atrial fibrillation ([place])    a. Dx 07/26/2014, CHA2DS2VASc = 5-->Eliquis;  c. 08/2014 Echo: EF 55-60%, no rwma, mildly dil LA/RA, mod-sev TR, PASP 56mmHg. b. s/p DCCV 08/2014. c. back in atrial flutter 09/2014 -> rate control pursued.  . Paroxysmal atrial flutter ([place])    a. Dx 07/26/2014, CHA2DS2VASc = 5-->Eliquis;  c. 08/2014 Echo: EF 55-60%, no rwma, mildly dil LA/RA, mod-sev TR, PASP 75mmHg.  b. s/p DCCV 08/2014. c. back in atrial flutter 09/2014 -> rate control pursued.  . Tricuspid regurgitation    a. Echo 08/2014: mod-severe.    Patient Active Problem List   Diagnosis Date Noted  . Chronic anticoagulation 02/22/2015  . Chronic atrial fibrillation ([place]) 11/19/2014  . Acute on chronic diastolic heart failure ([place]) 11/19/2014  . CKD (chronic kidney disease), stage III 11/19/2014  . Essential hypertension 11/19/2014  . Fibromuscular dysplasia ([place])   . Carotid arterial disease ([place])   . Hypertension 05/28/2011  . Menopause 05/28/2011  . Atherosclerotic RAS (renal artery stenosis), bilateral ([place]) 06/28/2010    Past Surgical History:  Procedure Laterality Date  . CARDIOVERSION N/A 09/01/2014   Procedure: CARDIOVERSION;  Surgeon: Josue Hector, MD;  Location: [facility] ENDOSCOPY;  Service: Cardiovascular;  Laterality: N/A;  . CARDIOVERSION N/A 11/23/2014   Procedure: CARDIOVERSION;  Surgeon: Skeet Latch, MD;  Location: [place];  Service: Cardiovascular;  Laterality: N/A;  . HEMORROIDECTOMY    . RENAL ARTERY STENT  2008   Right renal artery by Dr. Drucie Opitz  . TONSILLECTOMY    . TUBAL LIGATION  1970's    OB History    Gravida Para Term Preterm AB Living   4 4 4  0 0 4   SAB TAB Ectopic Multiple Live Births   0 0 0 0         Home Medications    Prior to Admission medications  Medication Sig Start Date End Date Taking? Authorizing Provider  B Complex-C (SUPER B COMPLEX PO) Take 1 tablet by mouth daily.   Yes Historical Provider, MD  carvedilol (COREG) 6.25 MG tablet Take 1 tablet (6.25 mg total) by mouth 2 (two) times daily. 12/14/14  Yes Deboraha Sprang, MD  dofetilide (TIKOSYN) 125 MCG capsule Take 3 capsules (375 mcg total) by mouth 2 (two) times daily. 08/08/15  Yes Belva Crome, MD  ELIQUIS 5 MG TABS tablet TAKE ONE TABLET BY MOUTH TWICE DAILY 04/10/15  Yes Belva Crome, MD  ESTRACE VAGINAL 0.1 MG/GM vaginal cream Place 1 Applicatorful vaginally as needed.   08/23/14  Yes Historical Provider, MD  furosemide (LASIX) 20 MG tablet Take 1 tablet (20 mg total) by mouth daily. 06/27/15  Yes Belva Crome, MD  magnesium oxide (MAG-OX) 400 (241.3 Mg) MG tablet Take 1 tablet (400 mg total) by mouth every morning. 01/11/15  Yes Belva Crome, MD  mirtazapine (REMERON) 15 MG tablet Take 15 mg by mouth at bedtime.   Yes Historical Provider, MD  Multiple Vitamin (MULTIVITAMIN WITH MINERALS) TABS tablet Take 1 tablet by mouth daily.   Yes Historical Provider, MD  nitroGLYCERIN (NITROSTAT) 0.4 MG SL tablet Place 1 tablet (0.4 mg total) under the tongue every 5 (five) minutes as needed for chest pain. 11/14/14  Yes Dayna N Dunn, PA-C  potassium chloride SA (KLOR-CON M20) 20 MEQ tablet Take 1 tablet (20 mEq total) by mouth daily. 09/28/14  Yes Dayna N Dunn, PA-C  acetaminophen-codeine (TYLENOL #3) 300-30 MG per tablet Take 1-2 tablets by mouth every 6 (six) hours as needed for moderate pain. Patient not taking: Reported on 08/30/2015 09/05/14   Domenic Moras, PA-C    Family History Family History  Problem Relation Age of Onset  . Stroke Mother 47  . Diabetes Mother   . Hypertension Mother   . Aneurysm Father     abdominal aortic aneurysm  . Diabetes Father   . Hypertension Father   . Heart attack Father   . Cancer Sister 25    breast cancer    Social History Social History  Substance Use Topics  . Smoking status: Never Smoker  . Smokeless tobacco: Never Used  . Alcohol use Yes   Allergies   Tagamet [cimetidine]  Review of Systems Review of Systems  Eyes: Negative for visual disturbance.  Neurological: Negative for dizziness.   Physical Exam Updated Vital Signs BP 179/63   Pulse (!) 50   Temp 97.6 F (36.4 C)   Resp 16   Ht 5\' 2"  (1.575 m)   Wt 110 lb (49.9 kg)   SpO2 100%   BMI 20.12 kg/m   Physical Exam  Constitutional: She is oriented to person, place, and time. She appears well-developed and well-nourished. No distress.  HENT:  Head:  Normocephalic and atraumatic.  Right Ear: Hearing normal.  Left Ear: Hearing normal.  Nose: Nose normal.  Mouth/Throat: Oropharynx is clear and moist and mucous membranes are normal.  Eyes: Conjunctivae and EOM are normal. Pupils are equal, round, and reactive to light.  Neck: Normal range of motion. Neck supple.  Cardiovascular: S1 normal and S2 normal.  Bradycardia present.  Exam reveals no gallop and no friction rub.   No murmur heard. Pulmonary/Chest: Effort normal and breath sounds normal. No respiratory distress. She exhibits no tenderness.  Abdominal: Soft. Normal appearance and bowel sounds are normal. There is no hepatosplenomegaly. There is no tenderness. There is no  rebound, no guarding, no tenderness at McBurney's point and negative Portugal's sign. No hernia.  Musculoskeletal: Normal range of motion.  Neurological: She is alert and oriented to person, place, and time. She has normal strength. No cranial nerve deficit or sensory deficit. Coordination normal. GCS eye subscore is 4. GCS verbal subscore is 5. GCS motor subscore is 6.  Skin: Skin is warm, dry and intact. No rash noted. No cyanosis.  Psychiatric: She has a normal mood and affect. Her speech is normal and behavior is normal. Thought content normal.  Nursing note and vitals reviewed.  ED Treatments / Results  Labs (all labs ordered are listed, but only abnormal results are displayed) Labs Reviewed  COMPREHENSIVE METABOLIC PANEL - Abnormal; Notable for the following:       Result Value   Glucose, Bld 204 (*)    BUN 28 (*)    Creatinine, Ser 1.21 (*)    GFR calc non Af Amer 42 (*)    GFR calc Af Amer 48 (*)    All other components within normal limits  URINALYSIS, ROUTINE W REFLEX MICROSCOPIC (NOT AT [facility]) - Abnormal; Notable for the following:    APPearance CLOUDY (*)    Hgb urine dipstick TRACE (*)    Protein, ur 30 (*)    Leukocytes, UA MODERATE (*)    All other components within normal limits  URINE  MICROSCOPIC-ADD ON - Abnormal; Notable for the following:    Squamous Epithelial / LPF 6-30 (*)    Bacteria, UA FEW (*)    All other components within normal limits  CBC    EKG  EKG Interpretation None       Radiology No results found.  Procedures Procedures  DIAGNOSTIC STUDIES:  Oxygen Saturation is 100% on RA, normal by my interpretation.    COORDINATION OF CARE:  1:32 AM Discussed treatment [name] with pt at bedside and pt agreed to [name].  Medications Ordered in ED Medications  amLODipine (NORVASC) tablet 5 mg (5 mg Oral Given 08/30/15 0159)    Initial Impression / Assessment and [name] / ED Course  I have reviewed the triage vital signs and the nursing notes.  Pertinent labs & imaging results that were available during my care of the patient were reviewed by me and considered in my medical decision making (see chart for details).  Clinical Course    Patient presents to the spiral for asymptomatic hypertension. Patient has been checking her blood pressure has been elevated all week. She does not have any headache, blurred vision, chest pain, shortness of breath associated with the blood pressure. She was given Norvasc here in the ER and monitored. Blood pressure is coming down. Rechecked by myself at 3 AM was 165/75. Patient informs me that she has a follow-up appointment with Dr. Daneen Schick tomorrow. I will therefore not prescribe any new medications, she can have her blood pressure checked at his office and he can decide on any further blood pressure treatment.  Patient was noted to have elevated blood sugar here in the ER as well. Reviewing her records reveals she carries a diagnosis of hyperglycemia, but she is unaware of this. Random blood sugar of 200 today is not completely diagnostic, but needs to be monitored closely. She can have her blood sugar rechecked at her doctor's visit as well.  I personally performed the services described in this documentation, which was  scribed in my presence. The recorded information has been reviewed and is accurate.   Final  Clinical Impressions(s) / ED Diagnoses   Final diagnoses:  Essential hypertension  Hyperglycemia    New Prescriptions New Prescriptions   No medications on file     Donna Greek, MD 08/30/15 [phone]

## 2015-08-31 ENCOUNTER — Encounter: Payer: Self-pay | Admitting: Physician Assistant

## 2015-08-31 ENCOUNTER — Ambulatory Visit (INDEPENDENT_AMBULATORY_CARE_PROVIDER_SITE_OTHER): Payer: Medicare Other | Admitting: Physician Assistant

## 2015-08-31 ENCOUNTER — Encounter (INDEPENDENT_AMBULATORY_CARE_PROVIDER_SITE_OTHER): Payer: Self-pay

## 2015-08-31 VITALS — BP 148/82 | HR 66 | Ht 61.0 in | Wt 106.4 lb

## 2015-08-31 DIAGNOSIS — I701 Atherosclerosis of renal artery: Secondary | ICD-10-CM

## 2015-08-31 DIAGNOSIS — R739 Hyperglycemia, unspecified: Secondary | ICD-10-CM | POA: Diagnosis not present

## 2015-08-31 DIAGNOSIS — N189 Chronic kidney disease, unspecified: Secondary | ICD-10-CM

## 2015-08-31 DIAGNOSIS — I5032 Chronic diastolic (congestive) heart failure: Secondary | ICD-10-CM

## 2015-08-31 DIAGNOSIS — I480 Paroxysmal atrial fibrillation: Secondary | ICD-10-CM

## 2015-08-31 DIAGNOSIS — I10 Essential (primary) hypertension: Secondary | ICD-10-CM

## 2015-08-31 DIAGNOSIS — I495 Sick sinus syndrome: Secondary | ICD-10-CM

## 2015-08-31 LAB — HEMOGLOBIN A1C
Hgb A1c MFr Bld: 5.8 % — ABNORMAL HIGH (ref <5.7)
Mean Plasma Glucose: 120 mg/dL

## 2015-08-31 MED ORDER — AMLODIPINE BESYLATE 5 MG PO TABS: 5.0000 mg | ORAL_TABLET | Freq: Every day | ORAL | 0 refills | Status: DC

## 2015-08-31 NOTE — Patient Instructions (Signed)
Your physician has recommended you make the following change in your medication:  START  AMLODIPINE  5 MG  [place]   Your physician recommends that you return for lab work in: TODAY   HGB A1C   KEEP  B/P  LOG  IF  [place]  140/90  [place]  Your physician recommends that you schedule a follow-up appointment in:   [place]

## 2015-09-03 DIAGNOSIS — E1165 Type 2 diabetes mellitus with hyperglycemia: Secondary | ICD-10-CM | POA: Diagnosis not present

## 2015-09-03 DIAGNOSIS — R739 Hyperglycemia, unspecified: Secondary | ICD-10-CM | POA: Diagnosis not present

## 2015-09-03 DIAGNOSIS — F418 Other specified anxiety disorders: Secondary | ICD-10-CM | POA: Diagnosis not present

## 2015-09-03 DIAGNOSIS — R413 Other amnesia: Secondary | ICD-10-CM | POA: Diagnosis not present

## 2015-09-03 DIAGNOSIS — I10 Essential (primary) hypertension: Secondary | ICD-10-CM | POA: Diagnosis not present

## 2015-09-03 DIAGNOSIS — I509 Heart failure, unspecified: Secondary | ICD-10-CM | POA: Diagnosis not present

## 2015-09-03 DIAGNOSIS — R7309 Other abnormal glucose: Secondary | ICD-10-CM | POA: Diagnosis not present

## 2015-09-03 DIAGNOSIS — E119 Type 2 diabetes mellitus without complications: Secondary | ICD-10-CM | POA: Diagnosis not present

## 2015-09-03 DIAGNOSIS — R634 Abnormal weight loss: Secondary | ICD-10-CM | POA: Diagnosis not present

## 2015-09-06 ENCOUNTER — Other Ambulatory Visit: Payer: Self-pay | Admitting: Internal Medicine

## 2015-09-06 DIAGNOSIS — N189 Chronic kidney disease, unspecified: Secondary | ICD-10-CM | POA: Diagnosis not present

## 2015-09-06 DIAGNOSIS — I480 Paroxysmal atrial fibrillation: Secondary | ICD-10-CM | POA: Diagnosis not present

## 2015-09-06 DIAGNOSIS — I130 Hypertensive heart and chronic kidney disease with heart failure and stage 1 through stage 4 chronic kidney disease, or unspecified chronic kidney disease: Secondary | ICD-10-CM | POA: Diagnosis not present

## 2015-09-06 DIAGNOSIS — F0390 Unspecified dementia without behavioral disturbance: Secondary | ICD-10-CM | POA: Diagnosis not present

## 2015-09-06 DIAGNOSIS — Z9289 Personal history of other medical treatment: Secondary | ICD-10-CM

## 2015-09-06 DIAGNOSIS — I509 Heart failure, unspecified: Secondary | ICD-10-CM | POA: Diagnosis not present

## 2015-09-06 DIAGNOSIS — M19032 Primary osteoarthritis, left wrist: Secondary | ICD-10-CM | POA: Diagnosis not present

## 2015-09-14 DIAGNOSIS — I130 Hypertensive heart and chronic kidney disease with heart failure and stage 1 through stage 4 chronic kidney disease, or unspecified chronic kidney disease: Secondary | ICD-10-CM | POA: Diagnosis not present

## 2015-09-14 DIAGNOSIS — M19032 Primary osteoarthritis, left wrist: Secondary | ICD-10-CM | POA: Diagnosis not present

## 2015-09-14 DIAGNOSIS — N189 Chronic kidney disease, unspecified: Secondary | ICD-10-CM | POA: Diagnosis not present

## 2015-09-14 DIAGNOSIS — I480 Paroxysmal atrial fibrillation: Secondary | ICD-10-CM | POA: Diagnosis not present

## 2015-09-14 DIAGNOSIS — F0390 Unspecified dementia without behavioral disturbance: Secondary | ICD-10-CM | POA: Diagnosis not present

## 2015-09-14 DIAGNOSIS — I509 Heart failure, unspecified: Secondary | ICD-10-CM | POA: Diagnosis not present

## 2015-09-18 ENCOUNTER — Other Ambulatory Visit: Payer: Self-pay

## 2015-09-18 NOTE — Patient Outreach (Signed)
[place] [facility]) Care Management  [place]  09/18/2015   Donna [name] [birth year] [id number]  Subjective: Telephone call to patient for monthly call. Patient reports she is doing good. Patient reports most recent weight is 104.5 lbs. Patient reports she tries to eat more but has never been a big eater.  Patient reports she remains active around the house cleaning things.  Discussed with patient heart failure action plan zones and when to notify physician.  She verbalized understanding. No concerns.    Objective:   Encounter Medications:  Outpatient Encounter Prescriptions as of 09/18/2015  Medication Sig  . acetaminophen-codeine (TYLENOL #3) 300-30 MG per tablet Take 1-2 tablets by mouth every 6 (six) hours as needed for moderate pain.  Marland Kitchen amLODipine (NORVASC) 5 MG tablet Take 1 tablet (5 mg total) by mouth daily.  . B Complex-C (SUPER B COMPLEX PO) Take 1 tablet by mouth daily.  . carvedilol (COREG) 6.25 MG tablet Take 1 tablet (6.25 mg total) by mouth 2 (two) times daily.  Marland Kitchen dofetilide (TIKOSYN) 125 MCG capsule Take 3 capsules (375 mcg total) by mouth 2 (two) times daily.  Marland Kitchen ELIQUIS 5 MG TABS tablet TAKE ONE TABLET BY MOUTH TWICE DAILY  . ESTRACE VAGINAL 0.1 MG/GM vaginal cream Place 1 Applicatorful vaginally as needed.   . furosemide (LASIX) 20 MG tablet Take 1 tablet (20 mg total) by mouth daily.  . magnesium oxide (MAG-OX) 400 (241.3 Mg) MG tablet Take 1 tablet (400 mg total) by mouth every morning.  . mirtazapine (REMERON) 15 MG tablet Take 15 mg by mouth at bedtime.  . Multiple Vitamin (MULTIVITAMIN WITH MINERALS) TABS tablet Take 1 tablet by mouth daily.  . potassium chloride SA (KLOR-CON M20) 20 MEQ tablet Take 1 tablet (20 mEq total) by mouth daily.  . nitroGLYCERIN (NITROSTAT) 0.4 MG SL tablet Place 1 tablet (0.4 mg total) under the tongue every 5 (five) minutes as needed for chest pain.   No facility-administered encounter medications on file as of  09/18/2015.     Functional Status:  In your present state of health, do you have any difficulty performing the following activities: 03/27/2015 03/06/2015  Hearing? N N  Vision? N N  Difficulty concentrating or making decisions? N N  Walking or climbing stairs? N N  Dressing or bathing? N N  Doing errands, shopping? N N  Preparing Food and eating ? N N  Using the Toilet? N N  In the past six months, have you accidently leaked urine? N N  Do you have problems with loss of bowel control? N N  Managing your Medications? N N  Managing your Finances? N N  Housekeeping or managing your Housekeeping? N N  Some recent data might be hidden    Fall/Depression Screening: PHQ 2/9 Scores 09/18/2015 04/24/2015 03/27/2015 03/06/2015 02/15/2015 02/13/2015 02/01/2015  PHQ - 2 Score 0 0 0 0 0 0 0    Assessment: Patient continues to benefit from health coach outreach for disease management and support.    Plan:  [place] [facility] CM Care Plan Problem One   Flowsheet Row Most Recent Value  Care Plan Problem One  knowledge deficit related to heart failure  Role Documenting the Problem One  [place] for Problem One  Active  THN Long Term Goal (31-90 days)  Patient will verbalize following heart failure action plan as a daily guide to maintenance within 90 days.   THN Long Term Goal Start Date  09/18/15 Barrie Folk continued]  Interventions for Problem One [place] reiterated with patient/caregiver heart failure action plan.     THN CM Care Plan Problem Two   Flowsheet Row Most Recent Value  Care Plan Problem Two  weight loss  Role Documenting the Problem Two  [place] for Problem Two  Active  THN CM Short Term Goal #1 (0-30 days)  Patient will monitor weight at least weekly within the next 30 days.  THN CM Short Term Goal #1 Start Date  08/16/15  [facility] CM Short Term Goal #1 Met Date   09/18/15  Interventions for Short Term Goal #2   Patient reports weighing at least  weekly.      RN Health Coach will contact patient in the month of October and patient agrees to next outreach.  Jone Baseman, RN, MSN [place] [phone]

## 2015-09-19 DIAGNOSIS — Z23 Encounter for immunization: Secondary | ICD-10-CM | POA: Diagnosis not present

## 2015-09-21 ENCOUNTER — Encounter: Payer: Medicare Other | Attending: Internal Medicine | Admitting: *Deleted

## 2015-09-21 DIAGNOSIS — R634 Abnormal weight loss: Secondary | ICD-10-CM | POA: Diagnosis not present

## 2015-09-21 DIAGNOSIS — Z713 Dietary counseling and surveillance: Secondary | ICD-10-CM | POA: Diagnosis not present

## 2015-09-21 DIAGNOSIS — E119 Type 2 diabetes mellitus without complications: Secondary | ICD-10-CM | POA: Diagnosis not present

## 2015-09-21 NOTE — Patient Instructions (Addendum)
Plan:  Continue eating 3 meals and 2-3 healthy snacks each day Continue choosing from all of the food groups at each meal and snack Continue with your activity level by doing water aerobic and yard work for 30 minutes daily as tolerated.  Let me know if you have any questions or concerns

## 2015-09-21 NOTE — Progress Notes (Signed)
Diabetes Self-Management Education  Visit Type: First/Initial  Appt. Start Time: 0800 Appt. End Time: 0900  09/21/2015  [name], identified by name and date of birth, is a 80 y.o. female with a diagnosis of Diabetes: Type 2. She is here with her husband who participated in the visit and appears supportive. She states she is not aware of any diagnosis of Diabetes but per a dietitian visit in 2014 her A1c was recorded as 6.6%. She is not on any diabetes medications at this time and her current A1c is 5.7%. She states her mother had diabetes and took shots, she does not want to ever have to do that. She expressed interest in maintaining a healthy weight and her husband states she started eating 3 meals a day a few months ago and her weight has increased as a result. She states she used to do water aerobics every day for 60 minutes but has cut that out recently in an effort to not lose weight. She states she does enjoy her yard work and cleans her own home.   ASSESSMENT  Height 5\' 2"  (1.575 m), weight 106 lb 12.8 oz (48.4 kg). Body mass index is 19.53 kg/m.      Diabetes Self-Management Education - 09/21/15 0752      Visit Information   Visit Type First/Initial     Initial Visit   Diabetes Type Type 2   Are you currently following a meal plan? No   Are you taking your medications as prescribed? Not on Medications   Date Diagnosed 2014     Health Coping   How would you rate your overall health? Excellent     Psychosocial Assessment   Patient Belief/Attitude about Diabetes Other (comment)  Doesn't know she has diabetes   Self-care barriers None   Self-management support Family   Other persons present Patient;Spouse/SO   Patient Concerns Weight Control  abnormal weight loss   Special Needs None   Preferred Learning Style No preference indicated   Learning Readiness Change in progress   How often do you need to have someone help you when you read instructions, pamphlets, or  other written materials from your doctor or pharmacy? 1 - Never   What is the last grade level you completed in school? college     Pre-Education Assessment   Patient understands the diabetes disease and treatment process. Needs Instruction   Patient understands incorporating nutritional management into lifestyle. Needs Instruction   Patient undertands incorporating physical activity into lifestyle. Demonstrates understanding / competency   Patient understands using medications safely. --  NA   Patient understands monitoring blood glucose, interpreting and using results --  NA   Patient understands prevention, detection, and treatment of acute complications. --  NA   Patient understands prevention, detection, and treatment of chronic complications. Needs Instruction   Patient understands how to develop strategies to address psychosocial issues. --  NA   Patient understands how to develop strategies to promote health/change behavior. Demonstrates understanding / competency     Complications   Last HgB A1C per patient/outside source 5.8 %   How often do you check your blood sugar? 0 times/day (not testing)   Number of hypoglycemic episodes per month 0   Have you had a dilated eye exam in the past 12 months? No   Have you had a dental exam in the past 12 months? Yes   Are you checking your feet? No     Dietary Intake  Breakfast 1 eggs, 1 scoop grits, 2 bacon, 1-2 toast with small amount of jelly, fresh fruit   Snack (morning) fresh fruit   Lunch whatever her husband fixes for her- lean meat, salad or vegetables, bread or baked potato   Snack (afternoon) fresh fruit   Dinner same as lunch, husband prepares their meals, lean meat, vegetables, starch   Snack (evening) 2 scoops ice cream every other day if anything   Beverage(s) water, skim milk     Exercise   Exercise Type Light (walking / raking leaves)  likes to swim in the pool, yard work daily   How many days per week to you  exercise? 6   How many minutes per day do you exercise? 45   Total minutes per week of exercise 270     Patient Education   Previous Diabetes Education No   Disease state  Factors that contribute to the development of diabetes   Nutrition management  Other (comment)  Rationale for spreading out carbohydrate foods throughout the day   Physical activity and exercise  Role of exercise on diabetes management, blood pressure control and cardiac health.   Chronic complications Relationship between chronic complications and blood glucose control   Psychosocial adjustment Identified and addressed patients feelings and concerns about diabetes     Individualized Goals (developed by patient)   Nutrition General guidelines for healthy choices and portions discussed   Physical Activity Exercise 3-5 times per week  in moderation, not in excess   Medications Not Applicable     Post-Education Assessment   Patient understands the diabetes disease and treatment process. Demonstrates understanding / competency   Patient understands incorporating nutritional management into lifestyle. Demonstrates understanding / competency   Patient undertands incorporating physical activity into lifestyle. Demonstrates understanding / competency     Outcomes   Expected Outcomes Demonstrated interest in learning. Expect positive outcomes   Future DMSE PRN   Program Status Completed      Individualized Plan for Diabetes Self-Management Training:   Learning Objective:  Patient will have a greater understanding of diabetes self-management. Patient education plan is to attend individual and/or group sessions per assessed needs and concerns.   Plan:   Patient Instructions  Plan:  I feel your current eating and activity habits are appropriate, good job. Continue eating 3 meals and 2-3 healthy snacks each day Continue choosing from all of the food groups at each meal and snack Continue with your activity level by  doing water aerobic and yard work for 30 minutes daily as tolerated, but not more.  Let me know if you have any questions or concerns      Expected Outcomes:  Demonstrated interest in learning. Expect positive outcomes  Education material provided: A1C conversion sheet to demonstrate criteria for diagnosing diabetes as well as using as a management tool.   If problems or questions, patient to contact team via:  Phone and Email  Future DSME appointment: PRN

## 2015-09-24 DIAGNOSIS — I480 Paroxysmal atrial fibrillation: Secondary | ICD-10-CM | POA: Diagnosis not present

## 2015-09-24 DIAGNOSIS — I130 Hypertensive heart and chronic kidney disease with heart failure and stage 1 through stage 4 chronic kidney disease, or unspecified chronic kidney disease: Secondary | ICD-10-CM | POA: Diagnosis not present

## 2015-09-24 DIAGNOSIS — I509 Heart failure, unspecified: Secondary | ICD-10-CM | POA: Diagnosis not present

## 2015-09-24 DIAGNOSIS — F0390 Unspecified dementia without behavioral disturbance: Secondary | ICD-10-CM | POA: Diagnosis not present

## 2015-09-24 DIAGNOSIS — M19032 Primary osteoarthritis, left wrist: Secondary | ICD-10-CM | POA: Diagnosis not present

## 2015-09-24 DIAGNOSIS — N189 Chronic kidney disease, unspecified: Secondary | ICD-10-CM | POA: Diagnosis not present

## 2015-10-02 ENCOUNTER — Telehealth: Payer: Self-pay

## 2015-10-02 NOTE — Telephone Encounter (Signed)
Called patient to inform her that her Eliquis samples was ready for pick-up at the [facility] office. Patient verbalized understanding and will come by to pick them up.

## 2015-10-02 NOTE — Telephone Encounter (Signed)
gave patient eliquis samples. 4 boxes. month supply.

## 2015-10-03 DIAGNOSIS — R634 Abnormal weight loss: Secondary | ICD-10-CM | POA: Diagnosis not present

## 2015-10-03 DIAGNOSIS — R413 Other amnesia: Secondary | ICD-10-CM | POA: Diagnosis not present

## 2015-10-03 DIAGNOSIS — I509 Heart failure, unspecified: Secondary | ICD-10-CM | POA: Diagnosis not present

## 2015-10-03 DIAGNOSIS — F418 Other specified anxiety disorders: Secondary | ICD-10-CM | POA: Diagnosis not present

## 2015-10-03 DIAGNOSIS — R739 Hyperglycemia, unspecified: Secondary | ICD-10-CM | POA: Diagnosis not present

## 2015-10-03 DIAGNOSIS — I10 Essential (primary) hypertension: Secondary | ICD-10-CM | POA: Diagnosis not present

## 2015-10-05 ENCOUNTER — Other Ambulatory Visit: Payer: Self-pay | Admitting: Interventional Cardiology

## 2015-10-05 DIAGNOSIS — I480 Paroxysmal atrial fibrillation: Secondary | ICD-10-CM

## 2015-10-12 ENCOUNTER — Other Ambulatory Visit: Payer: Self-pay

## 2015-10-12 NOTE — Patient Outreach (Signed)
[place] [facility]) Care Management  10/12/2015  Donna [name] March 28, 1935 [id number]   Telephone call to patient for monthly call. No answer. Unable to leave a message.  Plan: RN Health Coach will attempt patient again in the month of October.   Jone Baseman, RN, MSN [place] [phone]

## 2015-10-16 ENCOUNTER — Ambulatory Visit (INDEPENDENT_AMBULATORY_CARE_PROVIDER_SITE_OTHER): Payer: Medicare Other

## 2015-10-16 ENCOUNTER — Ambulatory Visit (INDEPENDENT_AMBULATORY_CARE_PROVIDER_SITE_OTHER): Payer: Medicare Other | Admitting: Sports Medicine

## 2015-10-16 ENCOUNTER — Encounter: Payer: Self-pay | Admitting: Sports Medicine

## 2015-10-16 VITALS — BP 139/66 | HR 59 | Ht 62.0 in | Wt 105.0 lb

## 2015-10-16 DIAGNOSIS — M19072 Primary osteoarthritis, left ankle and foot: Secondary | ICD-10-CM | POA: Diagnosis not present

## 2015-10-16 DIAGNOSIS — I701 Atherosclerosis of renal artery: Secondary | ICD-10-CM

## 2015-10-16 DIAGNOSIS — M79672 Pain in left foot: Secondary | ICD-10-CM

## 2015-10-16 DIAGNOSIS — M2142 Flat foot [pes planus] (acquired), left foot: Secondary | ICD-10-CM | POA: Diagnosis not present

## 2015-10-16 DIAGNOSIS — M2141 Flat foot [pes planus] (acquired), right foot: Secondary | ICD-10-CM | POA: Diagnosis not present

## 2015-10-16 DIAGNOSIS — M722 Plantar fascial fibromatosis: Secondary | ICD-10-CM

## 2015-10-16 NOTE — Patient Instructions (Signed)

## 2015-10-16 NOTE — Progress Notes (Signed)
Subjective: Donna [name] is a 80 y.o. female patient presents to office with complaint of arch on the left x 1 week after walking barefoot. Reports that it just hurts sometimes in the arch. Patient has treated this problem with Advil which helps. Denies any other pedal complaints.   Patient Active Problem List   Diagnosis Date Noted  . Chronic anticoagulation 02/22/2015  . Chronic atrial fibrillation ([place]) 11/19/2014  . Acute on chronic diastolic heart failure ([place]) 11/19/2014  . CKD (chronic kidney disease), stage III 11/19/2014  . Essential hypertension 11/19/2014  . Fibromuscular dysplasia ([place])   . Carotid arterial disease ([place])   . Hypertension 05/28/2011  . Menopause 05/28/2011  . Atherosclerotic RAS (renal artery stenosis), bilateral ([place]) 06/28/2010    Current Outpatient Prescriptions on File Prior to Visit  Medication Sig Dispense Refill  . acetaminophen-codeine (TYLENOL #3) 300-30 MG per tablet Take 1-2 tablets by mouth every 6 (six) hours as needed for moderate pain. 15 tablet 0  . amLODipine (NORVASC) 5 MG tablet Take 1 tablet (5 mg total) by mouth daily. 90 tablet 0  . B Complex-C (SUPER B COMPLEX PO) Take 1 tablet by mouth daily.    . carvedilol (COREG) 6.25 MG tablet Take 1 tablet (6.25 mg total) by mouth 2 (two) times daily. 180 tablet 3  . dofetilide (TIKOSYN) 125 MCG capsule TAKE THREE CAPSULES BY MOUTH TWICE DAILY 540 capsule 2  . ELIQUIS 5 MG TABS tablet TAKE ONE TABLET BY MOUTH TWICE DAILY 60 tablet 9  . ESTRACE VAGINAL 0.1 MG/GM vaginal cream Place 1 Applicatorful vaginally as needed.     . furosemide (LASIX) 20 MG tablet Take 1 tablet (20 mg total) by mouth daily. 90 tablet 1  . magnesium oxide (MAG-OX) 400 (241.3 Mg) MG tablet Take 1 tablet (400 mg total) by mouth every morning. 30 tablet 11  . mirtazapine (REMERON) 15 MG tablet Take 15 mg by mouth at bedtime.    . Multiple Vitamin (MULTIVITAMIN WITH MINERALS) TABS tablet Take 1 tablet by mouth daily.    .  nitroGLYCERIN (NITROSTAT) 0.4 MG SL tablet Place 1 tablet (0.4 mg total) under the tongue every 5 (five) minutes as needed for chest pain. 25 tablet 3  . potassium chloride SA (KLOR-CON M20) 20 MEQ tablet Take 1 tablet (20 mEq total) by mouth daily. 90 tablet 3   No current facility-administered medications on file prior to visit.     Allergies  Allergen Reactions  . Tagamet [Cimetidine] Hives  . Diovan [Valsartan]     Hair loss    Objective: Physical Exam General: The patient is alert and oriented x3 in no acute distress.  Dermatology: Skin is warm, dry and supple bilateral lower extremities. Nails 1-10 are short and mildly thickened. There is no erythema, edema, no eccymosis, no open lesions present. Integument is otherwise unremarkable.  Vascular: Dorsalis Pedis pulse and Posterior Tibial pulse are 2/4 bilateral. Capillary fill time is immediate to all digits.  Neurological: Grossly intact to light touch with an achilles reflex of +2/5 and a negative Tinel's sign bilateral.  Musculoskeletal: Tenderness to palpation at the midarch along plantar fascia on the left foot. No pain with compression of calcaneus bilateral. No pain with tuning fork to calcaneus bilateral. No pain with calf compression bilateral. There is decreased Ankle and midtarsal joint range of motion bilateral. All other joints range of motion within normal limits bilateral. Pes planus bilateral. Strength 5/5 in all groups bilateral.   Xray, Left foot:  Normal osseous mineralization. Joint spaces preserved except at midtarsal joint where there is breach and spur suggestive of arthritis with planus deformity. No fracture/dislocation/boney destruction. Minimal Calcaneal spur present with mild thickening of plantar fascia. No other soft tissue abnormalities or radiopaque foreign bodies.   Assessment and Plan: Problem List Items Addressed This Visit    None    Visit Diagnoses    Left foot pain    -  Primary   Relevant  Orders   DG Foot 2 Views Left   Osteoarthritis of left ankle and foot       Plantar fasciitis of left foot       Pes planus of both feet          -Complete examination performed.  -Xrays reviewed -Discussed with patient in detail the condition of mid arch plantar fasciitis, how this occurs and general treatment options. Explained both conservative and surgical treatments.  -Patient refused Rx oral meds and states will take her Tylenol or ADvil as needed -Recommended good supportive shoes and advised NO Barefoot walking; Encouraged use of OTC insert if fascial strapping works well that was applied today in office; keep intact for 5 days.  -Explained and dispensed to patient daily stretching exercises. -Recommend patient to ice affected area 1-2x daily. -Patient to return to office as needed for follow up or sooner if problems or questions arise.  Landis Martins, DPM

## 2015-10-26 ENCOUNTER — Other Ambulatory Visit: Payer: Self-pay

## 2015-10-26 NOTE — Patient Outreach (Signed)
[name]) Care Management  [place]  10/26/2015   Donna [name] 1935-05-21 [id number]  Subjective: Telephone call to patient for monthly call.  Patient reports she is doing well.  Patient reports seeing physician on 10-03-15. Patient denies any new changes.  Patient reports no new swelling or shortness of breath. Discussed with patient heart failure zone chart and when to notify physician.  She verbalized understanding.  No concerns.    Objective:   Encounter Medications:  Outpatient Encounter Prescriptions as of 10/26/2015  Medication Sig  . acetaminophen-codeine (TYLENOL #3) 300-30 MG per tablet Take 1-2 tablets by mouth every 6 (six) hours as needed for moderate pain.  Marland Kitchen amLODipine (NORVASC) 5 MG tablet Take 1 tablet (5 mg total) by mouth daily.  . B Complex-C (SUPER B COMPLEX PO) Take 1 tablet by mouth daily.  . carvedilol (COREG) 6.25 MG tablet Take 1 tablet (6.25 mg total) by mouth 2 (two) times daily.  Marland Kitchen dofetilide (TIKOSYN) 125 MCG capsule TAKE THREE CAPSULES BY MOUTH TWICE DAILY  . ELIQUIS 5 MG TABS tablet TAKE ONE TABLET BY MOUTH TWICE DAILY  . ESTRACE VAGINAL 0.1 MG/GM vaginal cream Place 1 Applicatorful vaginally as needed.   . furosemide (LASIX) 20 MG tablet Take 1 tablet (20 mg total) by mouth daily.  . magnesium oxide (MAG-OX) 400 (241.3 Mg) MG tablet Take 1 tablet (400 mg total) by mouth every morning.  . mirtazapine (REMERON) 15 MG tablet Take 15 mg by mouth at bedtime.  . Multiple Vitamin (MULTIVITAMIN WITH MINERALS) TABS tablet Take 1 tablet by mouth daily.  . nitroGLYCERIN (NITROSTAT) 0.4 MG SL tablet Place 1 tablet (0.4 mg total) under the tongue every 5 (five) minutes as needed for chest pain.  . potassium chloride SA (KLOR-CON M20) 20 MEQ tablet Take 1 tablet (20 mEq total) by mouth daily.   No facility-administered encounter medications on file as of 10/26/2015.     Functional Status:  In your present state of health, do you have  any difficulty performing the following activities: 03/27/2015 03/06/2015  Hearing? N N  Vision? N N  Difficulty concentrating or making decisions? N N  Walking or climbing stairs? N N  Dressing or bathing? N N  Doing errands, shopping? N N  Preparing Food and eating ? N N  Using the Toilet? N N  In the past six months, have you accidently leaked urine? N N  Do you have problems with loss of bowel control? N N  Managing your Medications? N N  Managing your Finances? N N  Housekeeping or managing your Housekeeping? N N  Some recent data might be hidden    Fall/Depression Screening: PHQ 2/9 Scores 10/26/2015 09/21/2015 09/18/2015 04/24/2015 03/27/2015 03/06/2015 02/15/2015  PHQ - 2 Score 0 0 0 0 0 0 0    Assessment: Patient continues to benefit from health coach outreach for disease management and support.    Plan:  [facility] CM Care Plan Problem One   Flowsheet Row Most Recent Value  Care Plan Problem One  knowledge deficit related to heart failure  Role Documenting the Problem One  [place] for Problem One  Active  THN Long Term Goal (31-90 days)  Patient will verbalize following heart failure action plan as a daily guide to maintenance within 90 days.   THN Long Term Goal Start Date  10/26/15 [goal continued]  Interventions for Problem One Long Term Goal  RN Health Coach reinforced with patient/caregiver heart  failure action plan.      RN Health Coach will contact patient in the month of November and patient agrees to next outreach.  Donna Baseman, RN, MSN [place] [phone]

## 2015-11-16 ENCOUNTER — Other Ambulatory Visit: Payer: Self-pay

## 2015-11-16 NOTE — Patient Outreach (Signed)
[place] [facility]) Care Management  [place]  11/16/2015   MARVELLE MORRELL [birth year] [id number]  Subjective: Telephone call to patient for  monthly call.  Patient reports she is doing well.  Patient denies any problems with heart failure. Discussed with patient heart failure and when to notify physician. She verbalized understanding.    Objective:   Encounter Medications:  Outpatient Encounter Prescriptions as of 11/16/2015  Medication Sig  . acetaminophen-codeine (TYLENOL #3) 300-30 MG per tablet Take 1-2 tablets by mouth every 6 (six) hours as needed for moderate pain.  Marland Kitchen amLODipine (NORVASC) 5 MG tablet Take 1 tablet (5 mg total) by mouth daily.  . B Complex-C (SUPER B COMPLEX PO) Take 1 tablet by mouth daily.  . carvedilol (COREG) 6.25 MG tablet Take 1 tablet (6.25 mg total) by mouth 2 (two) times daily.  Marland Kitchen dofetilide (TIKOSYN) 125 MCG capsule TAKE THREE CAPSULES BY MOUTH TWICE DAILY  . ELIQUIS 5 MG TABS tablet TAKE ONE TABLET BY MOUTH TWICE DAILY  . ESTRACE VAGINAL 0.1 MG/GM vaginal cream Place 1 Applicatorful vaginally as needed.   . furosemide (LASIX) 20 MG tablet Take 1 tablet (20 mg total) by mouth daily.  . magnesium oxide (MAG-OX) 400 (241.3 Mg) MG tablet Take 1 tablet (400 mg total) by mouth every morning.  . mirtazapine (REMERON) 15 MG tablet Take 15 mg by mouth at bedtime.  . Multiple Vitamin (MULTIVITAMIN WITH MINERALS) TABS tablet Take 1 tablet by mouth daily.  . nitroGLYCERIN (NITROSTAT) 0.4 MG SL tablet Place 1 tablet (0.4 mg total) under the tongue every 5 (five) minutes as needed for chest pain.  . potassium chloride SA (KLOR-CON M20) 20 MEQ tablet Take 1 tablet (20 mEq total) by mouth daily.   No facility-administered encounter medications on file as of 11/16/2015.     Functional Status:  In your present state of health, do you have any difficulty performing the following activities: 03/27/2015 03/06/2015  Hearing? N N  Vision? N N   Difficulty concentrating or making decisions? N N  Walking or climbing stairs? N N  Dressing or bathing? N N  Doing errands, shopping? N N  Preparing Food and eating ? N N  Using the Toilet? N N  In the past six months, have you accidently leaked urine? N N  Do you have problems with loss of bowel control? N N  Managing your Medications? N N  Managing your Finances? N N  Housekeeping or managing your Housekeeping? N N  Some recent data might be hidden    Fall/Depression Screening: PHQ 2/9 Scores 11/16/2015 10/26/2015 09/21/2015 09/18/2015 04/24/2015 03/27/2015 03/06/2015  PHQ - 2 Score 0 0 0 0 0 0 0    Assessment: Patient continues to benefit from health coach outreach for disease management and support.    Plan:  [facility] CM Care Plan Problem One   Flowsheet Row Most Recent Value  Care Plan Problem One  knowledge deficit related to heart failure  Role Documenting the Problem One  [place] for Problem One  Active  THN Long Term Goal (31-90 days)  Patient will verbalize following heart failure action plan as a daily guide to maintenance within 90 days.   THN Long Term Goal Start Date  11/16/15 [goal continued]  Interventions for Problem One Long Term Goal  RN Health Coach discussed with patient/caregiver heart failure action plan.      RN Health Coach will contact patient in the month of December  and patient agrees to next outreach.  Jone Baseman, RN, MSN [place] [phone]

## 2015-12-03 ENCOUNTER — Encounter: Payer: Self-pay | Admitting: Interventional Cardiology

## 2015-12-03 ENCOUNTER — Ambulatory Visit (INDEPENDENT_AMBULATORY_CARE_PROVIDER_SITE_OTHER): Payer: Medicare Other | Admitting: Interventional Cardiology

## 2015-12-03 VITALS — BP 150/80 | HR 50 | Ht 61.0 in | Wt 107.2 lb

## 2015-12-03 DIAGNOSIS — I779 Disorder of arteries and arterioles, unspecified: Secondary | ICD-10-CM | POA: Diagnosis not present

## 2015-12-03 DIAGNOSIS — I482 Chronic atrial fibrillation, unspecified: Secondary | ICD-10-CM

## 2015-12-03 DIAGNOSIS — I10 Essential (primary) hypertension: Secondary | ICD-10-CM | POA: Diagnosis not present

## 2015-12-03 DIAGNOSIS — I701 Atherosclerosis of renal artery: Secondary | ICD-10-CM | POA: Diagnosis not present

## 2015-12-03 DIAGNOSIS — R739 Hyperglycemia, unspecified: Secondary | ICD-10-CM

## 2015-12-03 DIAGNOSIS — I5032 Chronic diastolic (congestive) heart failure: Secondary | ICD-10-CM

## 2015-12-03 DIAGNOSIS — Z7901 Long term (current) use of anticoagulants: Secondary | ICD-10-CM

## 2015-12-03 DIAGNOSIS — I739 Peripheral vascular disease, unspecified: Secondary | ICD-10-CM

## 2015-12-03 MED ORDER — APIXABAN 5 MG PO TABS: 5.0000 mg | ORAL_TABLET | Freq: Two times a day (BID) | ORAL | 3 refills | Status: DC

## 2015-12-03 MED ORDER — AMLODIPINE BESYLATE 5 MG PO TABS: 5.0000 mg | ORAL_TABLET | Freq: Every day | ORAL | 3 refills | Status: DC

## 2015-12-03 NOTE — Patient Instructions (Signed)
Medication Instructions:  None  Labwork: None  Testing/Procedures: None  Follow-Up: Your physician recommends that you schedule a follow-up appointment in: 6 months with a PA or NP.  Your physician wants you to follow-up in: 1 year with Dr. Tamala Julian.  You will receive a reminder letter in the mail two months in advance. If you don't receive a letter, please call our office to schedule the follow-up appointment.    Any Other Special Instructions Will Be Listed Below (If Applicable).     If you need a refill on your cardiac medications before your next appointment, please call your pharmacy.

## 2015-12-03 NOTE — Progress Notes (Signed)
Cardiology Office Note    Date:  12/03/2015   ID:  [name], DOB [birth year], MRN [id number]  PCP:  Henrine Screws, MD  Cardiologist: Sinclair Grooms, MD   Chief Complaint  Patient presents with  . Atrial Fibrillation    History of Present Illness:  Donna [name] is a 80 y.o. female  atherosclerotic kidney disease, hypertension, paroxysmal atrial fibrillation, chronic diastolic heart failure when in A. fib, and chronic anticoagulation therapy.On chronic dofetilide and apixaban therapy.  She is doing well. No exertional intolerance. She seems to have no worries. Her husband manages her medications. No episodes of syncope. Denies chest pain.   Past Medical History:  Diagnosis Date  . Atherosclerosis of renal artery (HCC)    a. s/p R RA stenting;  b. 04/2014 Renal Art duplex: Patent R RA stent, <60% L RA stenosis. Followed by [facility].  . Carotid arterial disease ([place])    a. 01/2014 Carotid U/S: RICA [id number], LICA [id number]. Followed by [facility].  . CKD (chronic kidney disease), stage III    Stage II/III  . Essential hypertension   . Fibrocystic breast   . Fibromuscular dysplasia ([place])   . Hyperglycemia   . Mitral regurgitation    a. Echo 08/2014: mild MR.  . Paroxysmal atrial fibrillation ([place])    a. Dx 07/26/2014, CHA2DS2VASc = 5-->Eliquis;  c. 08/2014 Echo: EF 55-60%, no rwma, mildly dil LA/RA, mod-sev TR, PASP 32mmHg. b. s/p DCCV 08/2014. c. back in atrial flutter 09/2014 -> rate control pursued.  . Paroxysmal atrial flutter ([place])    a. Dx 07/26/2014, CHA2DS2VASc = 5-->Eliquis;  c. 08/2014 Echo: EF 55-60%, no rwma, mildly dil LA/RA, mod-sev TR, PASP 79mmHg. b. s/p DCCV 08/2014. c. back in atrial flutter 09/2014 -> rate control pursued.  . Tricuspid regurgitation    a. Echo 08/2014: mod-severe.    Past Surgical History:  Procedure Laterality Date  . CARDIOVERSION N/A 09/01/2014   Procedure: CARDIOVERSION;  Surgeon: Josue Hector, MD;  Location: [facility] ENDOSCOPY;  Service: Cardiovascular;   Laterality: N/A;  . CARDIOVERSION N/A 11/23/2014   Procedure: CARDIOVERSION;  Surgeon: Skeet Latch, MD;  Location: [place];  Service: Cardiovascular;  Laterality: N/A;  . HEMORROIDECTOMY    . RENAL ARTERY STENT  2008   Right renal artery by Dr. Drucie Opitz  . TONSILLECTOMY    . TUBAL LIGATION  1970's    Current Medications: Outpatient Medications Prior to Visit  Medication Sig Dispense Refill  . acetaminophen-codeine (TYLENOL #3) 300-30 MG per tablet Take 1-2 tablets by mouth every 6 (six) hours as needed for moderate pain. 15 tablet 0  . B Complex-C (SUPER B COMPLEX PO) Take 1 tablet by mouth daily.    . carvedilol (COREG) 6.25 MG tablet Take 1 tablet (6.25 mg total) by mouth 2 (two) times daily. 180 tablet 3  . dofetilide (TIKOSYN) 125 MCG capsule TAKE THREE CAPSULES BY MOUTH TWICE DAILY 540 capsule 2  . ESTRACE VAGINAL 0.1 MG/GM vaginal cream Place 1 Applicatorful vaginally as needed.     . furosemide (LASIX) 20 MG tablet Take 1 tablet (20 mg total) by mouth daily. 90 tablet 1  . magnesium oxide (MAG-OX) 400 (241.3 Mg) MG tablet Take 1 tablet (400 mg total) by mouth every morning. 30 tablet 11  . mirtazapine (REMERON) 15 MG tablet Take 15 mg by mouth at bedtime.    . Multiple Vitamin (MULTIVITAMIN WITH MINERALS) TABS tablet Take 1 tablet by mouth daily.    . nitroGLYCERIN (  NITROSTAT) 0.4 MG SL tablet Place 1 tablet (0.4 mg total) under the tongue every 5 (five) minutes as needed for chest pain. 25 tablet 3  . potassium chloride SA (KLOR-CON M20) 20 MEQ tablet Take 1 tablet (20 mEq total) by mouth daily. 90 tablet 3  . ELIQUIS 5 MG TABS tablet TAKE ONE TABLET BY MOUTH TWICE DAILY 60 tablet 9  . amLODipine (NORVASC) 5 MG tablet Take 1 tablet (5 mg total) by mouth daily. 90 tablet 0   No facility-administered medications prior to visit.      Allergies:   Tagamet [cimetidine] and Diovan [valsartan]   Social History   Social History  . Marital status: Married    Spouse  name: N/A  . Number of children: N/A  . Years of education: N/A   Social History Main Topics  . Smoking status: Never Smoker  . Smokeless tobacco: Never Used  . Alcohol use Yes  . Drug use: No  . Sexual activity: Yes    Birth control/ protection: Surgical     Comment: BTL   Other Topics Concern  . None   Social History Narrative  . None     Family History:  The patient's family history includes Aneurysm in her father; Cancer (age of onset: 27) in her sister; Diabetes in her father and mother; Heart attack in her father; Hypertension in her father and mother; Stroke (age of onset: 53) in her mother.   ROS:   Please see the history of present illness.    Decreased memory.  All other systems reviewed and are negative.   PHYSICAL EXAM:   VS:  BP (!) 150/80   Pulse (!) 50   Ht 5\' 1"  (1.549 m)   Wt 107 lb 3.2 oz (48.6 kg)   BMI 20.26 kg/m    GEN: Well nourished, well developed, in no acute distress  HEENT: normal  Neck: no JVD, carotid bruits, or masses Cardiac: RRR; no murmurs, rubs, or gallops,no edema  Respiratory:  clear to auscultation bilaterally, normal work of breathing GI: soft, nontender, nondistended, + BS MS: no deformity or atrophy  Skin: warm and dry, no rash Neuro:  Alert and Oriented x 3, Strength and sensation are intact Psych: euthymic mood, full affect  Wt Readings from Last 3 Encounters:  12/03/15 107 lb 3.2 oz (48.6 kg)  10/16/15 105 lb (47.6 kg)  09/21/15 106 lb 12.8 oz (48.4 kg)      Studies/Labs Reviewed:   EKG:  EKG  Demonstrates sinus bradycardia at 49 bpm, first-degree AV block at 254 ms, QTC of 455. The QTC is a relative improvement compared to February 2017.  Recent Labs: 03/23/2015: Magnesium 1.9 08/29/2015: ALT 25; BUN 28; Creatinine, Ser 1.21; Hemoglobin 13.9; Platelets 190; Potassium 4.4; Sodium 139   Lipid Panel No results found for: CHOL, TRIG, HDL, CHOLHDL, VLDL, LDLCALC, LDLDIRECT  Additional studies/ records that were  reviewed today include:  No new additional studies have been performed. The magnesium in March was 1.9 potassium 4.4 in August.    ASSESSMENT:    1. Chronic atrial fibrillation ([place])   2. Essential hypertension   3. Chronic anticoagulation   4. Atherosclerotic RAS (renal artery stenosis), bilateral (HCC)   5. Bilateral carotid artery disease ([place])   6. Chronic diastolic HF (heart failure) ([place])   7. Hyperglycemia      PLAN:  In order of problems listed above:  1. Atrial fibrillation is controlled with dofetilide therapy. No clinical recurrences. 2. Excellent control  with repeat blood pressure today 130/70 mmHg. Continue current therapy. 3. No bleeding complications. Samples given. 3 month refillable prescriptions are written 4. Blood pressures under excellent control with medical therapy. 5. Asymptomatic and being followed clinically.    Medication Adjustments/Labs and Tests Ordered: Current medicines are reviewed at length with the patient today.  Concerns regarding medicines are outlined above.  Medication changes, Labs and Tests ordered today are listed in the Patient Instructions below. Patient Instructions  Medication Instructions:  None  Labwork: None  Testing/Procedures: None  Follow-Up: Your physician recommends that you schedule a follow-up appointment in: 6 months with a PA or NP.  Your physician wants you to follow-up in: 1 year with Dr. Tamala Julian.  You will receive a reminder letter in the mail two months in advance. If you don't receive a letter, please call our office to schedule the follow-up appointment.    Any Other Special Instructions Will Be Listed Below (If Applicable).     If you need a refill on your cardiac medications before your next appointment, please call your pharmacy.      Signed, Sinclair Grooms, MD  12/03/2015 8:48 AM    [facility] [address], [place], [place]  [address] Phone: [phone]; Fax:  [phone]

## 2015-12-13 DIAGNOSIS — N644 Mastodynia: Secondary | ICD-10-CM | POA: Diagnosis not present

## 2015-12-18 ENCOUNTER — Other Ambulatory Visit: Payer: Self-pay

## 2015-12-18 NOTE — Patient Outreach (Signed)
[facility]) Care Management  [place]  12/18/2015   PORSHAY CARNLEY 1935/09/30 [id number]  Subjective: Telephone call to patient for monthly call.  Spoke with spouse he is able to verify HIPAA.  He reports that patient is doing very well.  No problems with heart failure.  He reports that her weights are ok and no problems with swelling or shortness of breath.  Reviewed with spouse heart failure signs and symptoms and when to notify physician. He verbalized understanding.  Discussed with spouse moving patient to every other month calls.  He verbalized understanding and is in agreement.  Objective:   Encounter Medications:  Outpatient Encounter Prescriptions as of 12/18/2015  Medication Sig  . acetaminophen-codeine (TYLENOL #3) 300-30 MG per tablet Take 1-2 tablets by mouth every 6 (six) hours as needed for moderate pain.  Marland Kitchen amLODipine (NORVASC) 5 MG tablet Take 1 tablet (5 mg total) by mouth daily.  Marland Kitchen apixaban (ELIQUIS) 5 MG TABS tablet Take 1 tablet (5 mg total) by mouth 2 (two) times daily.  . B Complex-C (SUPER B COMPLEX PO) Take 1 tablet by mouth daily.  . carvedilol (COREG) 6.25 MG tablet Take 1 tablet (6.25 mg total) by mouth 2 (two) times daily.  Marland Kitchen dofetilide (TIKOSYN) 125 MCG capsule TAKE THREE CAPSULES BY MOUTH TWICE DAILY  . ESTRACE VAGINAL 0.1 MG/GM vaginal cream Place 1 Applicatorful vaginally as needed.   . furosemide (LASIX) 20 MG tablet Take 1 tablet (20 mg total) by mouth daily.  . magnesium oxide (MAG-OX) 400 (241.3 Mg) MG tablet Take 1 tablet (400 mg total) by mouth every morning.  . mirtazapine (REMERON) 15 MG tablet Take 15 mg by mouth at bedtime.  . Multiple Vitamin (MULTIVITAMIN WITH MINERALS) TABS tablet Take 1 tablet by mouth daily.  . nitroGLYCERIN (NITROSTAT) 0.4 MG SL tablet Place 1 tablet (0.4 mg total) under the tongue every 5 (five) minutes as needed for chest pain.  . potassium chloride SA (KLOR-CON M20) 20 MEQ tablet Take 1 tablet  (20 mEq total) by mouth daily.   No facility-administered encounter medications on file as of 12/18/2015.     Functional Status:  In your present state of health, do you have any difficulty performing the following activities: 03/27/2015 03/06/2015  Hearing? N N  Vision? N N  Difficulty concentrating or making decisions? N N  Walking or climbing stairs? N N  Dressing or bathing? N N  Doing errands, shopping? N N  Preparing Food and eating ? N N  Using the Toilet? N N  In the past six months, have you accidently leaked urine? N N  Do you have problems with loss of bowel control? N N  Managing your Medications? N N  Managing your Finances? N N  Housekeeping or managing your Housekeeping? N N  Some recent data might be hidden    Fall/Depression Screening: PHQ 2/9 Scores 12/18/2015 11/16/2015 10/26/2015 09/21/2015 09/18/2015 04/24/2015 03/27/2015  PHQ - 2 Score 0 0 0 0 0 0 0    Assessment: Patient continues to benefit from health coach outreach for disease management and support.    Plan:  [facility] CM Care Plan Problem One   Flowsheet Row Most Recent Value  Care Plan Problem One  knowledge deficit related to heart failure  Role Documenting the Problem One  [place] for Problem One  Active  [facility] Long Term Goal (31-90 days)  Patient will verbalize following heart failure action plan as a daily  guide to maintenance within 90 days.   [facility]N Long Term Goal Start Date  11/16/15 [goal continued]  Interventions for Problem One Long Term Goal  RN Health Coach reviewed with patient/caregiver heart failure action plan.       RN Health Coach will contact patient/caregiver in the month of February and patient/caregiver agrees to next outreach.  Jone Baseman, RN, MSN [place] [phone]

## 2015-12-26 ENCOUNTER — Encounter (HOSPITAL_COMMUNITY): Payer: Self-pay | Admitting: Neurology

## 2015-12-26 ENCOUNTER — Emergency Department (HOSPITAL_COMMUNITY)
Admission: EM | Admit: 2015-12-26 | Discharge: 2015-12-26 | Disposition: A | Discharge status: Home / Self Care | Payer: Medicare Other | Attending: Emergency Medicine | Admitting: Emergency Medicine

## 2015-12-26 ENCOUNTER — Emergency Department (HOSPITAL_COMMUNITY): Payer: Medicare Other

## 2015-12-26 DIAGNOSIS — R0789 Other chest pain: Secondary | ICD-10-CM | POA: Insufficient documentation

## 2015-12-26 DIAGNOSIS — I5032 Chronic diastolic (congestive) heart failure: Secondary | ICD-10-CM | POA: Diagnosis not present

## 2015-12-26 DIAGNOSIS — Z79899 Other long term (current) drug therapy: Secondary | ICD-10-CM | POA: Diagnosis not present

## 2015-12-26 DIAGNOSIS — I2510 Atherosclerotic heart disease of native coronary artery without angina pectoris: Secondary | ICD-10-CM | POA: Diagnosis not present

## 2015-12-26 DIAGNOSIS — I130 Hypertensive heart and chronic kidney disease with heart failure and stage 1 through stage 4 chronic kidney disease, or unspecified chronic kidney disease: Secondary | ICD-10-CM | POA: Insufficient documentation

## 2015-12-26 DIAGNOSIS — R079 Chest pain, unspecified: Secondary | ICD-10-CM | POA: Diagnosis present

## 2015-12-26 DIAGNOSIS — Z7901 Long term (current) use of anticoagulants: Secondary | ICD-10-CM | POA: Diagnosis not present

## 2015-12-26 DIAGNOSIS — N183 Chronic kidney disease, stage 3 (moderate): Secondary | ICD-10-CM | POA: Insufficient documentation

## 2015-12-26 LAB — I-STAT TROPONIN, ED
TROPONIN I, POC: 0.01 ng/mL (ref 0.00–0.08)
TROPONIN I, POC: 0 ng/mL (ref 0.00–0.08)

## 2015-12-26 LAB — BASIC METABOLIC PANEL
ANION GAP: 8 (ref 5–15)
BUN: 29 mg/dL — ABNORMAL HIGH (ref 6–20)
CHLORIDE: 108 mmol/L (ref 101–111)
CO2: 25 mmol/L (ref 22–32)
CREATININE: 0.98 mg/dL (ref 0.44–1.00)
Calcium: 10 mg/dL (ref 8.9–10.3)
GFR calc non Af Amer: 54 mL/min — ABNORMAL LOW (ref >60)
Glucose, Bld: 117 mg/dL — ABNORMAL HIGH (ref 65–99)
Potassium: 4.1 mmol/L (ref 3.5–5.1)
SODIUM: 141 mmol/L (ref 135–145)

## 2015-12-26 LAB — CBC
HCT: 39.9 % (ref 36.0–46.0)
HEMOGLOBIN: 13.4 g/dL (ref 12.0–15.0)
MCH: 32 pg (ref 26.0–34.0)
MCHC: 33.6 g/dL (ref 30.0–36.0)
MCV: 95.2 fL (ref 78.0–100.0)
PLATELETS: 179 10*3/uL (ref 150–400)
RBC: 4.19 MIL/uL (ref 3.87–5.11)
RDW: 13.9 % (ref 11.5–15.5)
WBC: 4.3 10*3/uL (ref 4.0–10.5)

## 2015-12-26 NOTE — ED Notes (Signed)
Pt states   A pain in left chest aching woke her up this am, lasted about a min or 2 and then wwent away, pt states it was abnormal and she wanted o come to ER, states NO  N/v/d or radiation or sweating, pain is gone now and has not come back, no nitro. Pt does see  Dr Pernell Dupre for some heart issues she is unsure what for

## 2015-12-26 NOTE — ED Notes (Signed)
Pt and husband given drinks and crackers

## 2015-12-26 NOTE — ED Notes (Signed)
Pt states glad all was ok ready to go

## 2015-12-26 NOTE — ED Notes (Signed)
Pt given Ginger Ale with ice per Cynthia(RN)

## 2015-12-26 NOTE — ED Triage Notes (Signed)
Pt reporting left sided cp when she woke up this morning. Pain went away. No radiation, n/v, sob, or diaphoresis.

## 2015-12-26 NOTE — ED Provider Notes (Signed)
[place] DEPT Provider Note   CSN: [id number] Arrival date & time: 12/26/15  [id number]     History   Chief Complaint Chief Complaint  Patient presents with  . Chest Pain    HPI Donna [name] is a 80 y.o. female.  The history is provided by the patient.  Chest Pain   This is a new problem. The current episode started 1 to 2 hours ago. Episode frequency: once. The problem has been resolved. The pain is associated with rest. The pain is present in the lateral region. The pain is moderate. The quality of the pain is described as brief and sharp. The pain does not radiate. Duration of episode(s) is 15 seconds. Pertinent negatives include no abdominal pain, no back pain, no claudication, no cough, no diaphoresis, no fever, no headaches, no hemoptysis, no irregular heartbeat, no leg pain, no lower extremity edema, no malaise/fatigue, no nausea, no near-syncope, no numbness, no orthopnea, no palpitations, no shortness of breath, no sputum production, no syncope and no vomiting. She has tried nothing for the symptoms.  Her past medical history is significant for CAD and hypertension.  Pertinent negatives for past medical history include no CHF, no DVT, no hyperlipidemia, no MI and no PE.  Procedure history is positive for cardiac catheterization (01/2014 with [id number] RICA and LICA) and stress thallium (negative on 08/2014).    Past Medical History:  Diagnosis Date  . Atherosclerosis of renal artery (HCC)    a. s/p R RA stenting;  b. 04/2014 Renal Art duplex: Patent R RA stent, <60% L RA stenosis. Followed by [facility].  . Carotid arterial disease ([place])    a. 01/2014 Carotid U/S: RICA [id number], LICA [id number]. Followed by [facility].  . CKD (chronic kidney disease), stage III    Stage II/III  . Essential hypertension   . Fibrocystic breast   . Fibromuscular dysplasia ([place])   . Hyperglycemia   . Mitral regurgitation    a. Echo 08/2014: mild MR.  . Paroxysmal atrial fibrillation ([place])    a. Dx 07/26/2014, CHA2DS2VASc  = 5-->Eliquis;  c. 08/2014 Echo: EF 55-60%, no rwma, mildly dil LA/RA, mod-sev TR, PASP 36mmHg. b. s/p DCCV 08/2014. c. back in atrial flutter 09/2014 -> rate control pursued.  . Paroxysmal atrial flutter ([place])    a. Dx 07/26/2014, CHA2DS2VASc = 5-->Eliquis;  c. 08/2014 Echo: EF 55-60%, no rwma, mildly dil LA/RA, mod-sev TR, PASP 95mmHg. b. s/p DCCV 08/2014. c. back in atrial flutter 09/2014 -> rate control pursued.  . Tricuspid regurgitation    a. Echo 08/2014: mod-severe.    Patient Active Problem List   Diagnosis Date Noted  . Chronic anticoagulation 02/22/2015  . Chronic atrial fibrillation ([place]) 11/19/2014  . Chronic diastolic HF (heart failure) ([place]) 11/19/2014  . CKD (chronic kidney disease), stage III 11/19/2014  . Fibromuscular dysplasia ([place])   . Carotid arterial disease ([place])   . Hypertension 05/28/2011  . Menopause 05/28/2011  . Atherosclerotic RAS (renal artery stenosis), bilateral ([place]) 06/28/2010    Past Surgical History:  Procedure Laterality Date  . CARDIOVERSION N/A 09/01/2014   Procedure: CARDIOVERSION;  Surgeon: Josue Hector, MD;  Location: [facility] ENDOSCOPY;  Service: Cardiovascular;  Laterality: N/A;  . CARDIOVERSION N/A 11/23/2014   Procedure: CARDIOVERSION;  Surgeon: Skeet Latch, MD;  Location: [place];  Service: Cardiovascular;  Laterality: N/A;  . HEMORROIDECTOMY    . RENAL ARTERY STENT  2008   Right renal artery by Dr. Drucie Opitz  . TONSILLECTOMY    . TUBAL  LIGATION  1970's    OB History    Gravida Para Term Preterm AB Living   4 4 4  0 0 4   SAB TAB Ectopic Multiple Live Births   0 0 0 0         Home Medications    Prior to Admission medications   Medication Sig Start Date End Date Taking? Authorizing Provider  amLODipine (NORVASC) 5 MG tablet Take 1 tablet (5 mg total) by mouth daily. 12/03/15 03/02/16 Yes Belva Crome, MD  apixaban (ELIQUIS) 5 MG TABS tablet Take 1 tablet (5 mg total) by mouth 2 (two) times daily. 12/03/15  Yes Belva Crome,  MD  B Complex-C (SUPER B COMPLEX PO) Take 1 tablet by mouth daily.   Yes Historical Provider, MD  carvedilol (COREG) 6.25 MG tablet Take 1 tablet (6.25 mg total) by mouth 2 (two) times daily. 12/14/14  Yes Deboraha Sprang, MD  dofetilide [facility]) 125 MCG capsule TAKE THREE CAPSULES BY MOUTH TWICE DAILY 10/05/15  Yes Belva Crome, MD  furosemide (LASIX) 20 MG tablet Take 1 tablet (20 mg total) by mouth daily. 06/27/15  Yes Belva Crome, MD  magnesium oxide (MAG-OX) 400 (241.3 Mg) MG tablet Take 1 tablet (400 mg total) by mouth every morning. 01/11/15  Yes Belva Crome, MD  mirtazapine (REMERON) 15 MG tablet Take 15 mg by mouth at bedtime.   Yes Historical Provider, MD  Multiple Vitamin (MULTIVITAMIN WITH MINERALS) TABS tablet Take 1 tablet by mouth daily.   Yes Historical Provider, MD  acetaminophen-codeine (TYLENOL #3) 300-30 MG per tablet Take 1-2 tablets by mouth every 6 (six) hours as needed for moderate pain. Patient not taking: Reported on 12/26/2015 09/05/14   Domenic Moras, PA-C  nitroGLYCERIN (NITROSTAT) 0.4 MG SL tablet Place 1 tablet (0.4 mg total) under the tongue every 5 (five) minutes as needed for chest pain. 11/14/14   Dayna N Dunn, PA-C  potassium chloride SA (KLOR-CON M20) 20 MEQ tablet Take 1 tablet (20 mEq total) by mouth daily. Patient not taking: Reported on 12/26/2015 09/28/14   Charlie Pitter, PA-C    Family History Family History  Problem Relation Age of Onset  . Stroke Mother 28  . Diabetes Mother   . Hypertension Mother   . Aneurysm Father     abdominal aortic aneurysm  . Diabetes Father   . Hypertension Father   . Heart attack Father   . Cancer Sister 71    breast cancer    Social History Social History  Substance Use Topics  . Smoking status: Never Smoker  . Smokeless tobacco: Never Used  . Alcohol use Yes     Allergies   Tagamet [cimetidine] and Diovan [valsartan]   Review of Systems Review of Systems  Constitutional: Negative for diaphoresis, fever and  malaise/fatigue.  Respiratory: Negative for cough, hemoptysis, sputum production and shortness of breath.   Cardiovascular: Positive for chest pain. Negative for palpitations, orthopnea, claudication, syncope and near-syncope.  Gastrointestinal: Negative for abdominal pain, nausea and vomiting.  Musculoskeletal: Negative for back pain.  Neurological: Negative for numbness and headaches.     Physical Exam Updated Vital Signs BP 156/92 (BP Location: Left Arm)   Pulse 57   Temp 97.9 F (36.6 C) (Oral)   Resp 16   Ht 5\' 1"  (1.549 m)   Wt 100 lb (45.4 kg)   SpO2 100%   BMI 18.89 kg/m   Physical Exam  Constitutional: She is oriented to person,  place, and time. She appears well-developed and well-nourished. No distress.  HENT:  Head: Normocephalic and atraumatic.  Nose: Nose normal.  Eyes: Conjunctivae and EOM are normal. Pupils are equal, round, and reactive to light. Right eye exhibits no discharge. Left eye exhibits no discharge. No scleral icterus.  Neck: Normal range of motion. Neck supple.  Cardiovascular: Normal rate and regular rhythm.  Exam reveals no gallop and no friction rub.   No murmur heard. Pulmonary/Chest: Effort normal and breath sounds normal. No stridor. No respiratory distress. She has no rales. She exhibits tenderness.    Abdominal: Soft. She exhibits no distension. There is no tenderness.  Musculoskeletal: She exhibits no edema or tenderness.  Neurological: She is alert and oriented to person, place, and time.  Skin: Skin is warm and dry. No rash noted. She is not diaphoretic. No erythema.  Psychiatric: She has a normal mood and affect.  Vitals reviewed.    ED Treatments / Results  Labs (all labs ordered are listed, but only abnormal results are displayed) Labs Reviewed  BASIC METABOLIC PANEL - Abnormal; Notable for the following:       Result Value   Glucose, Bld 117 (*)    BUN 29 (*)    GFR calc non Af Amer 54 (*)    All other components within  normal limits  CBC  I-STAT TROPOININ, ED  I-STAT TROPOININ, ED    EKG  EKG Interpretation  Date/Time:  Wednesday December 26 2015 [id number] EST Ventricular Rate:  61 PR Interval:  238 QRS Duration: 84 QT Interval:  438 QTC Calculation: 440 R Axis:   91 Text Interpretation:  Sinus rhythm with 1st degree A-V block Rightward axis Anteroseptal infarct , age undetermined Abnormal ECG No significant change since last tracing Confirmed by [facility] MD, Mayson Sterbenz [phone]) on 12/26/2015 9:15:57 AM       Radiology Dg Chest 2 View  Result Date: 12/26/2015 CLINICAL DATA:  Left side chest pain starting this morning EXAM: CHEST  2 VIEW COMPARISON:  01/18/2015 FINDINGS: Cardiomediastinal silhouette is stable. No infiltrate or pulmonary edema. Degenerative changes bilateral shoulders. Levoscoliosis lower thoracic and lumbar spine. Degenerative changes mid and lower thoracic spine. IMPRESSION: No active cardiopulmonary disease. Electronically Signed   By: Lahoma Crocker M.D.   On: 12/26/2015 09:04    Procedures Procedures (including critical care time)  Medications Ordered in ED Medications - No data to display   Initial Impression / Assessment and Plan / ED Course  I have reviewed the triage vital signs and the nursing notes.  Pertinent labs & imaging results that were available during my care of the patient were reviewed by me and considered in my medical decision making (see chart for details).  Clinical Course     Pt w/ H/o CAD but negative stress 08/2014. However, presentation highly atypical and inconsistent with ACS. EKG w/o acute ischemic changes or pericarditis. Chest x-ray without evidence suggestive of pneumonia, pneumothorax, pneumomediastinum.  No abnormal contour of the mediastinum to suggest dissection. No evidence of acute injuries. Triage labs w negative Trop. I feel that a Delta Trop would sufficient to r/o ACS.  Low suspicion for pulmonary embolism. Presentation is classic for  aortic dissection or esophageal perforation.  Delta troponin negative.   No recurrence of her symptoms during ED stay.  The patient is safe for discharge with strict return precautions.   Final Clinical Impressions(s) / ED Diagnoses   Final diagnoses:  Nonspecific chest pain   Disposition: Discharge  Condition: Good  I have discussed the results, Dx and Tx plan with the patient who expressed understanding and agree(s) with the plan. Discharge instructions discussed at great length. The patient was given strict return precautions who verbalized understanding of the instructions. No further questions at time of discharge.    Discharge Medication List as of 12/26/2015  1:39 PM      Follow Up: Josetta Huddle, MD [address] [place] [address] [phone]  Schedule an appointment as soon as possible for a visit  to schedule repeat stress test      Fatima Blank, MD 12/26/15 [phone]

## 2015-12-29 ENCOUNTER — Encounter (HOSPITAL_COMMUNITY): Payer: Self-pay | Admitting: Emergency Medicine

## 2015-12-29 ENCOUNTER — Emergency Department (HOSPITAL_COMMUNITY)
Admission: EM | Admit: 2015-12-29 | Discharge: 2015-12-29 | Disposition: A | Discharge status: Home / Self Care | Payer: Medicare Other | Attending: Emergency Medicine | Admitting: Emergency Medicine

## 2015-12-29 DIAGNOSIS — I130 Hypertensive heart and chronic kidney disease with heart failure and stage 1 through stage 4 chronic kidney disease, or unspecified chronic kidney disease: Secondary | ICD-10-CM | POA: Diagnosis not present

## 2015-12-29 DIAGNOSIS — Y999 Unspecified external cause status: Secondary | ICD-10-CM | POA: Insufficient documentation

## 2015-12-29 DIAGNOSIS — Y9389 Activity, other specified: Secondary | ICD-10-CM | POA: Diagnosis not present

## 2015-12-29 DIAGNOSIS — N183 Chronic kidney disease, stage 3 (moderate): Secondary | ICD-10-CM | POA: Insufficient documentation

## 2015-12-29 DIAGNOSIS — M722 Plantar fascial fibromatosis: Secondary | ICD-10-CM | POA: Diagnosis not present

## 2015-12-29 DIAGNOSIS — Y9289 Other specified places as the place of occurrence of the external cause: Secondary | ICD-10-CM | POA: Insufficient documentation

## 2015-12-29 DIAGNOSIS — Z79899 Other long term (current) drug therapy: Secondary | ICD-10-CM | POA: Diagnosis not present

## 2015-12-29 DIAGNOSIS — Z7901 Long term (current) use of anticoagulants: Secondary | ICD-10-CM | POA: Insufficient documentation

## 2015-12-29 DIAGNOSIS — X503XXA Overexertion from repetitive movements, initial encounter: Secondary | ICD-10-CM | POA: Insufficient documentation

## 2015-12-29 DIAGNOSIS — I5032 Chronic diastolic (congestive) heart failure: Secondary | ICD-10-CM | POA: Insufficient documentation

## 2015-12-29 DIAGNOSIS — M79672 Pain in left foot: Secondary | ICD-10-CM | POA: Diagnosis present

## 2015-12-29 MED ORDER — HYDROCODONE-ACETAMINOPHEN 5-325 MG PO TABS: 1.0000 | ORAL_TABLET | Freq: Four times a day (QID) | ORAL | 0 refills | Status: DC | PRN

## 2015-12-29 NOTE — ED Triage Notes (Signed)
PT repots going to a Party last night and danced for several  Hours. Pt woke up with foot pain.

## 2015-12-29 NOTE — ED Provider Notes (Signed)
[facility] DEPT Provider Note   CSN: [id number] Arrival date & time: 12/29/15  1622  By signing my name below, I, Donna [name], attest that this documentation has been prepared under the direction and in the presence of non-physician practitioner, Montine Circle, PA-C. Electronically Signed: Dolores [name], Scribe. 12/29/2015. 5:29 PM.  History   Chief Complaint Chief Complaint  Patient presents with  . Foot Pain   The history is provided by the patient. No language interpreter was used.    Donna [name] is an 80 y.o. female who presents to the Emergency Department with a chief complaint of sudden-onset unchanged left foot pain beginning hen she woke up today. She describes her pain as a 9/10 pain exacerbated by walking. Pt notes that she has been walking in flat shoes recently and believes this may have contributed to her symptoms. She denies any recent injury or falls. Pt is able to ambulate with some difficulty.   Past Medical History:  Diagnosis Date  . Atherosclerosis of renal artery (HCC)    a. s/p R RA stenting;  b. 04/2014 Renal Art duplex: Patent R RA stent, <60% L RA stenosis. Followed by [facility].  . Carotid arterial disease ([place])    a. 01/2014 Carotid U/S: RICA [id number], LICA [id number]. Followed by [facility].  . CKD (chronic kidney disease), stage III    Stage II/III  . Essential hypertension   . Fibrocystic breast   . Fibromuscular dysplasia ([place])   . Hyperglycemia   . Mitral regurgitation    a. Echo 08/2014: mild MR.  . Paroxysmal atrial fibrillation ([place])    a. Dx 07/26/2014, CHA2DS2VASc = 5-->Eliquis;  c. 08/2014 Echo: EF 55-60%, no rwma, mildly dil LA/RA, mod-sev TR, PASP 76mmHg. b. s/p DCCV 08/2014. c. back in atrial flutter 09/2014 -> rate control pursued.  . Paroxysmal atrial flutter ([place])    a. Dx 07/26/2014, CHA2DS2VASc = 5-->Eliquis;  c. 08/2014 Echo: EF 55-60%, no rwma, mildly dil LA/RA, mod-sev TR, PASP 35mmHg. b. s/p DCCV 08/2014. c. back in atrial flutter 09/2014 -> rate control  pursued.  . Tricuspid regurgitation    a. Echo 08/2014: mod-severe.    Patient Active Problem List   Diagnosis Date Noted  . Chronic anticoagulation 02/22/2015  . Chronic atrial fibrillation ([place]) 11/19/2014  . Chronic diastolic HF (heart failure) ([place]) 11/19/2014  . CKD (chronic kidney disease), stage III 11/19/2014  . Fibromuscular dysplasia ([place])   . Carotid arterial disease ([place])   . Hypertension 05/28/2011  . Menopause 05/28/2011  . Atherosclerotic RAS (renal artery stenosis), bilateral ([place]) 06/28/2010    Past Surgical History:  Procedure Laterality Date  . CARDIOVERSION N/A 09/01/2014   Procedure: CARDIOVERSION;  Surgeon: Josue Hector, MD;  Location: [facility] ENDOSCOPY;  Service: Cardiovascular;  Laterality: N/A;  . CARDIOVERSION N/A 11/23/2014   Procedure: CARDIOVERSION;  Surgeon: Skeet Latch, MD;  Location: [facility];  Service: Cardiovascular;  Laterality: N/A;  . HEMORROIDECTOMY    . RENAL ARTERY STENT  2008   Right renal artery by Dr. Drucie Opitz  . TONSILLECTOMY    . TUBAL LIGATION  1970's    OB History    Gravida Para Term Preterm AB Living   4 4 4  0 0 4   SAB TAB Ectopic Multiple Live Births   0 0 0 0         Home Medications    Prior to Admission medications   Medication Sig Start Date End Date Taking? Authorizing Provider  acetaminophen-codeine (TYLENOL #3) 300-30 MG  per tablet Take 1-2 tablets by mouth every 6 (six) hours as needed for moderate pain. Patient not taking: Reported on 12/26/2015 09/05/14   Domenic Moras, PA-C  amLODipine (NORVASC) 5 MG tablet Take 1 tablet (5 mg total) by mouth daily. 12/03/15 03/02/16  Belva Crome, MD  apixaban (ELIQUIS) 5 MG TABS tablet Take 1 tablet (5 mg total) by mouth 2 (two) times daily. 12/03/15   Belva Crome, MD  B Complex-C (SUPER B COMPLEX PO) Take 1 tablet by mouth daily.    Historical Provider, MD  carvedilol (COREG) 6.25 MG tablet Take 1 tablet (6.25 mg total) by mouth 2 (two) times daily. 12/14/14   Deboraha Sprang, MD  dofetilide [facility]) 125 MCG capsule TAKE THREE CAPSULES BY MOUTH TWICE DAILY 10/05/15   Belva Crome, MD  furosemide (LASIX) 20 MG tablet Take 1 tablet (20 mg total) by mouth daily. 06/27/15   Belva Crome, MD  magnesium oxide (MAG-OX) 400 (241.3 Mg) MG tablet Take 1 tablet (400 mg total) by mouth every morning. 01/11/15   Belva Crome, MD  mirtazapine (REMERON) 15 MG tablet Take 15 mg by mouth at bedtime.    Historical Provider, MD  Multiple Vitamin (MULTIVITAMIN WITH MINERALS) TABS tablet Take 1 tablet by mouth daily.    Historical Provider, MD  nitroGLYCERIN (NITROSTAT) 0.4 MG SL tablet Place 1 tablet (0.4 mg total) under the tongue every 5 (five) minutes as needed for chest pain. 11/14/14   Dayna N Dunn, PA-C  potassium chloride SA (KLOR-CON M20) 20 MEQ tablet Take 1 tablet (20 mEq total) by mouth daily. Patient not taking: Reported on 12/26/2015 09/28/14   Charlie Pitter, PA-C    Family History Family History  Problem Relation Age of Onset  . Stroke Mother 23  . Diabetes Mother   . Hypertension Mother   . Aneurysm Father     abdominal aortic aneurysm  . Diabetes Father   . Hypertension Father   . Heart attack Father   . Cancer Sister 62    breast cancer    Social History Social History  Substance Use Topics  . Smoking status: Never Smoker  . Smokeless tobacco: Never Used  . Alcohol use Yes     Allergies   Tagamet [cimetidine] and Diovan [valsartan]   Review of Systems Review of Systems  Musculoskeletal: Positive for myalgias (Left Foot).  Skin: Negative for wound.     Physical Exam Updated Vital Signs BP 137/67   Pulse 63   Temp 97.7 F (36.5 C) (Oral)   Resp 16   SpO2 100%   Physical Exam Physical Exam  Constitutional: Pt appears well-developed and well-nourished. No distress.  HENT:  Head: Normocephalic and atraumatic.  Eyes: Conjunctivae are normal.  Neck: Normal range of motion.  Cardiovascular: Normal rate, regular rhythm and intact  distal pulses.   Capillary refill < 3 sec  Pulmonary/Chest: Effort normal and breath sounds normal.  Musculoskeletal: Pt exhibits tenderness to palpation over the left plantar fascia, no bony abnormality or deformity. Pt exhibits no edema.  ROM: 5/5  Neurological: Pt  is alert. Coordination normal.  Sensation 5/5 Strength 5/5  Skin: Skin is warm and dry. Pt is not diaphoretic.  No tenting of the skin  Psychiatric: Pt has a normal mood and affect.  Nursing note and vitals reviewed.   ED Treatments / Results  DIAGNOSTIC STUDIES:  Oxygen Saturation is 100% on RA, normal by my interpretation.    COORDINATION OF  CARE:  6:00 PM Discussed treatment plan with pt at bedside which includes ice and rolling her foot across a water bottle and pt agreed to plan.  Labs (all labs ordered are listed, but only abnormal results are displayed) Labs Reviewed - No data to display  EKG  EKG Interpretation None       Radiology No results found.  Procedures Procedures (including critical care time)  Medications Ordered in ED Medications - No data to display   Initial Impression / Assessment and Plan / ED Course  I have reviewed the triage vital signs and the nursing notes.  Pertinent labs & imaging results that were available during my care of the patient were reviewed by me and considered in my medical decision making (see chart for details).  Clinical Course      Pt advised to follow up with orthopedics. Patient given instructions regarding therapies for plantar fasciitis while in ED, conservative therapy recommended and discussed. Patient will be discharged home & is agreeable with above plan. Returns precautions discussed. Pt appears safe for discharge.  Final Clinical Impressions(s) / ED Diagnoses   Final diagnoses:  Plantar fasciitis of left foot    New Prescriptions New Prescriptions   HYDROCODONE-ACETAMINOPHEN (NORCO/VICODIN) 5-325 MG TABLET    Take 1 tablet by mouth  every 6 (six) hours as needed.  I personally performed the services described in this documentation, which was scribed in my presence. The recorded information has been reviewed and is accurate.       Montine Circle, PA-C 12/29/15 1810    Fatima Blank, MD 12/30/15 [phone]

## 2015-12-29 NOTE — ED Triage Notes (Signed)
Pt states "i went to bed last night and I woke up and my left foot was hurting, I was walking around the house a lot with flats yesterday. My left foot is aching right in the middle." No swelling. No bruising. No injury. Pt ambulatory. Pain in the arch of foot.

## 2016-01-02 DIAGNOSIS — I10 Essential (primary) hypertension: Secondary | ICD-10-CM | POA: Diagnosis not present

## 2016-01-13 ENCOUNTER — Other Ambulatory Visit: Payer: Self-pay | Admitting: Interventional Cardiology

## 2016-01-27 IMAGING — CR DG CHEST 2V
2 series · 2 of 2 positions shown · non-contrast
Comparison: 12/06/2014

CLINICAL DATA: Weight loss 15 pounds in 4 months.

EXAM:
CHEST  2 VIEW

[w chest pa]
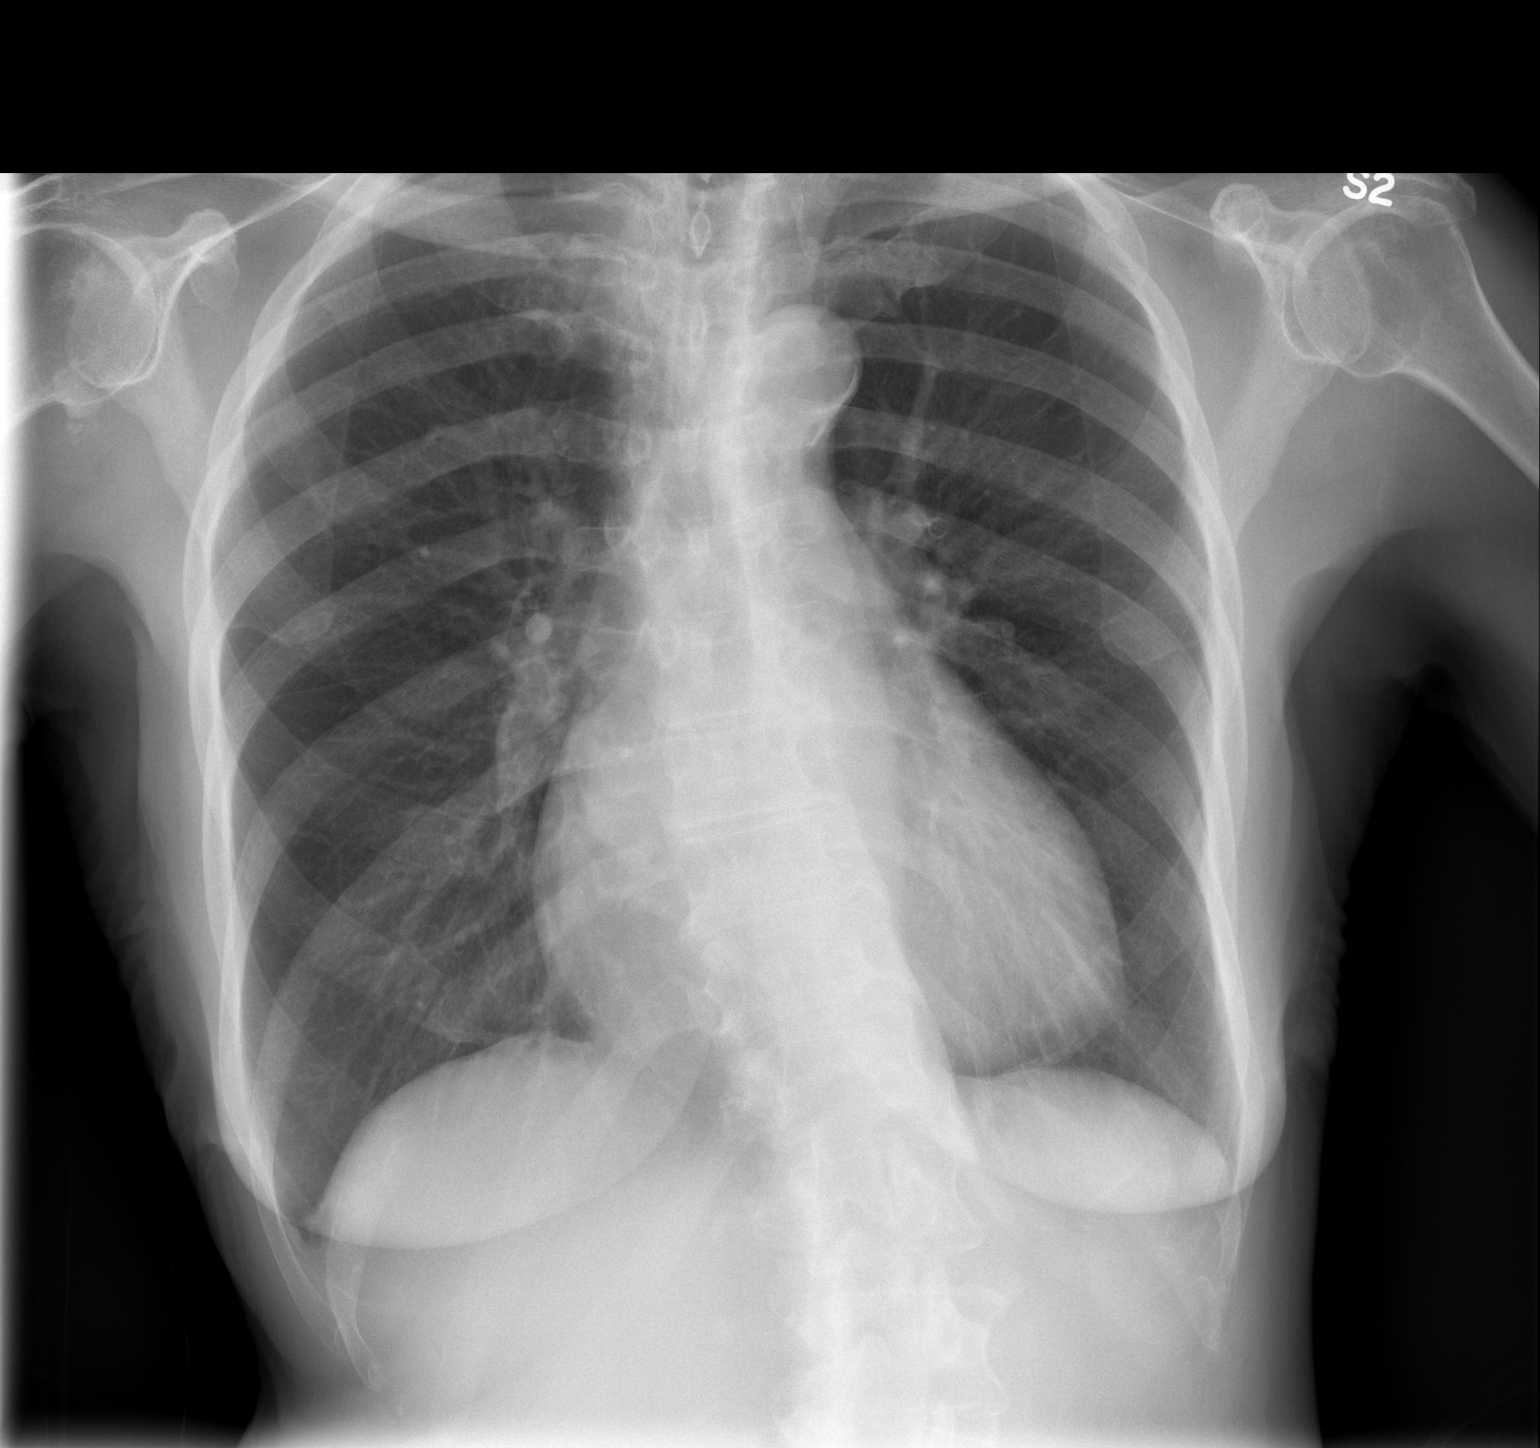

[w chest lat]
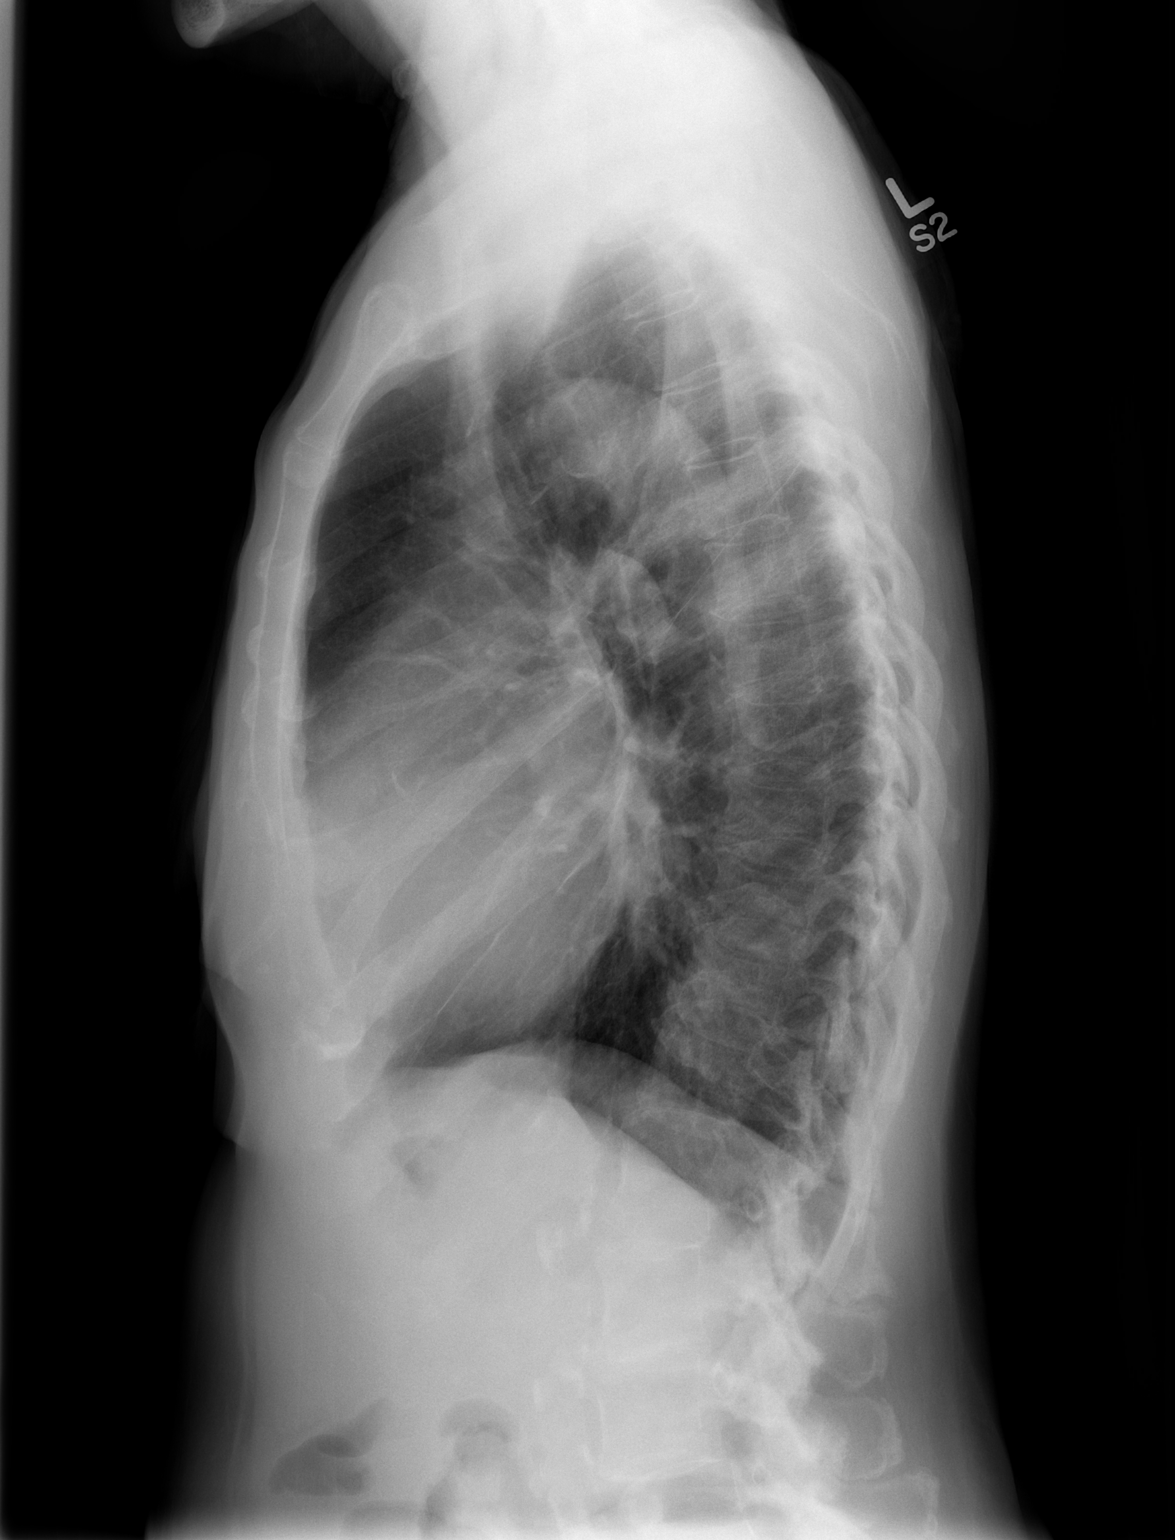

[2 of 2 positions shown; findings below may reference images not displayed]

FINDINGS: Mild cardiac enlargement. Negative for heart failure. Heart size
unchanged from the prior study.

Lungs are clear without infiltrate or effusion. No mass or
adenopathy.
IMPRESSION: No active cardiopulmonary disease.

## 2016-02-08 ENCOUNTER — Other Ambulatory Visit: Payer: Self-pay

## 2016-02-08 NOTE — Patient Outreach (Signed)
[facility]) Care Management  [place]  02/08/2016   Donna [name] Jul 21, 1935 [id number]  Subjective: Telephone call to patient for every other month call.  Patient reports she is doing well.  She states she saw the primary doctor last month. Patient denies any symptoms of heart failure. Reiterated with patient signs and symptoms of heart failure and when to notify physician. She verbalized understanding.   Objective:   Encounter Medications:  Outpatient Encounter Prescriptions as of 02/08/2016  Medication Sig  . amLODipine (NORVASC) 5 MG tablet Take 1 tablet (5 mg total) by mouth daily.  Marland Kitchen apixaban (ELIQUIS) 5 MG TABS tablet Take 1 tablet (5 mg total) by mouth 2 (two) times daily.  . B Complex-C (SUPER B COMPLEX PO) Take 1 tablet by mouth daily.  . carvedilol (COREG) 6.25 MG tablet Take 1 tablet (6.25 mg total) by mouth 2 (two) times daily.  Marland Kitchen dofetilide (TIKOSYN) 125 MCG capsule TAKE THREE CAPSULES BY MOUTH TWICE DAILY  . furosemide (LASIX) 20 MG tablet Take 1 tablet (20 mg total) by mouth daily.  Marland Kitchen HYDROcodone-acetaminophen (NORCO/VICODIN) 5-325 MG tablet Take 1 tablet by mouth every 6 (six) hours as needed.  . magnesium oxide (MAG-OX) 400 (241.3 Mg) MG tablet Take 1 tablet (400 mg total) by mouth every morning.  . magnesium oxide (MAG-OX) 400 MG tablet Take 1 tablet (400 mg total) by mouth every morning.  . mirtazapine (REMERON) 15 MG tablet Take 15 mg by mouth at bedtime.  . Multiple Vitamin (MULTIVITAMIN WITH MINERALS) TABS tablet Take 1 tablet by mouth daily.  . nitroGLYCERIN (NITROSTAT) 0.4 MG SL tablet Place 1 tablet (0.4 mg total) under the tongue every 5 (five) minutes as needed for chest pain.  Marland Kitchen acetaminophen-codeine (TYLENOL #3) 300-30 MG per tablet Take 1-2 tablets by mouth every 6 (six) hours as needed for moderate pain. (Patient not taking: Reported on 12/26/2015)  . potassium chloride SA (KLOR-CON M20) 20 MEQ tablet Take 1 tablet (20 mEq total) by  mouth daily. (Patient not taking: Reported on 12/26/2015)   No facility-administered encounter medications on file as of 02/08/2016.     Functional Status:  In your present state of health, do you have any difficulty performing the following activities: 03/27/2015 03/06/2015  Hearing? N N  Vision? N N  Difficulty concentrating or making decisions? N N  Walking or climbing stairs? N N  Dressing or bathing? N N  Doing errands, shopping? N N  Preparing Food and eating ? N N  Using the Toilet? N N  In the past six months, have you accidently leaked urine? N N  Do you have problems with loss of bowel control? N N  Managing your Medications? N N  Managing your Finances? N N  Housekeeping or managing your Housekeeping? N N  Some recent data might be hidden    Fall/Depression Screening: PHQ 2/9 Scores 02/08/2016 12/18/2015 11/16/2015 10/26/2015 09/21/2015 09/18/2015 04/24/2015  PHQ - 2 Score 0 0 0 0 0 0 0    Assessment: Patient continues to benefit from health coach outreach for disease management and support.    Plan:  [facility] CM Care Plan Problem One   Flowsheet Row Most Recent Value  Care Plan Problem One  knowledge deficit related to heart failure  Role Documenting the Problem One  [place] for Problem One  Active  THN Long Term Goal (31-90 days)  Patient will verbalize following heart failure action plan as a daily guide  to maintenance within 90 days.   THN Long Term Goal Start Date  02/08/16 [goal continued]  Interventions for Problem One Long Term Goal  RN Health Coach reiterated with patient/caregiver heart failure action plan.      RN Health Coach will contact patient in the month of April and patient agrees to next outreach.  Jone Baseman, RN, MSN [place] [phone]

## 2016-02-26 ENCOUNTER — Other Ambulatory Visit: Payer: Self-pay | Admitting: Internal Medicine

## 2016-02-27 NOTE — Telephone Encounter (Signed)
That's fine. Thanks!

## 2016-02-27 NOTE — Telephone Encounter (Signed)
Okay to refill under Dr Tamala Julian since patient is PRN follow up with Dr Caryl Comes. Please advise. Thanks, MI

## 2016-03-10 DIAGNOSIS — R51 Headache: Secondary | ICD-10-CM | POA: Diagnosis not present

## 2016-03-10 DIAGNOSIS — I10 Essential (primary) hypertension: Secondary | ICD-10-CM | POA: Diagnosis not present

## 2016-03-10 DIAGNOSIS — R413 Other amnesia: Secondary | ICD-10-CM | POA: Diagnosis not present

## 2016-03-27 DIAGNOSIS — N649 Disorder of breast, unspecified: Secondary | ICD-10-CM | POA: Diagnosis not present

## 2016-04-08 ENCOUNTER — Other Ambulatory Visit: Payer: Self-pay

## 2016-04-08 NOTE — Patient Outreach (Signed)
[facility]) Care Management  04/08/2016  Donna [name] [birth year] [id number]   Telephone call to patient for every other month call.  No answer.  HIPAA compliant voice message left.    Plan: RN Health Coach will attempt patient again in the month of April.    Jone Baseman, RN, MSN [place] [phone]

## 2016-04-08 NOTE — Patient Outreach (Signed)
[facility]) Care Management  [place]  04/08/2016   Donna [name] April 27, 1935 [id number]  Subjective: Telephone call from patient for every other month call.  Patient reports doing good.  Patient denies any issues with heart failure.  Discussed with patient heart failure and zones and when to seek assistance.  She verbalized understanding.    Objective:   Encounter Medications:  Outpatient Encounter Prescriptions as of 04/08/2016  Medication Sig  . apixaban (ELIQUIS) 5 MG TABS tablet Take 1 tablet (5 mg total) by mouth 2 (two) times daily.  . B Complex-C (SUPER B COMPLEX PO) Take 1 tablet by mouth daily.  . carvedilol (COREG) 6.25 MG tablet TAKE ONE TABLET BY MOUTH TWICE DAILY  . dofetilide (TIKOSYN) 125 MCG capsule TAKE THREE CAPSULES BY MOUTH TWICE DAILY  . furosemide (LASIX) 20 MG tablet Take 1 tablet (20 mg total) by mouth daily.  Marland Kitchen HYDROcodone-acetaminophen (NORCO/VICODIN) 5-325 MG tablet Take 1 tablet by mouth every 6 (six) hours as needed.  . magnesium oxide (MAG-OX) 400 (241.3 Mg) MG tablet Take 1 tablet (400 mg total) by mouth every morning.  . magnesium oxide (MAG-OX) 400 MG tablet Take 1 tablet (400 mg total) by mouth every morning.  . mirtazapine (REMERON) 15 MG tablet Take 15 mg by mouth at bedtime.  . Multiple Vitamin (MULTIVITAMIN WITH MINERALS) TABS tablet Take 1 tablet by mouth daily.  . nitroGLYCERIN (NITROSTAT) 0.4 MG SL tablet Place 1 tablet (0.4 mg total) under the tongue every 5 (five) minutes as needed for chest pain.  Marland Kitchen acetaminophen-codeine (TYLENOL #3) 300-30 MG per tablet Take 1-2 tablets by mouth every 6 (six) hours as needed for moderate pain. (Patient not taking: Reported on 12/26/2015)  . amLODipine (NORVASC) 5 MG tablet Take 1 tablet (5 mg total) by mouth daily.  . potassium chloride SA (KLOR-CON M20) 20 MEQ tablet Take 1 tablet (20 mEq total) by mouth daily. (Patient not taking: Reported on 12/26/2015)   No facility-administered  encounter medications on file as of 04/08/2016.     Functional Status:  No flowsheet data found.  Fall/Depression Screening: PHQ 2/9 Scores 04/08/2016 02/08/2016 12/18/2015 11/16/2015 10/26/2015 09/21/2015 09/18/2015  PHQ - 2 Score 0 0 0 0 0 0 0    Assessment: Patient continues to benefit from health coach outreach for disease management and support.    Plan:  [facility] CM Care Plan Problem One     Most Recent Value  Care Plan Problem One  knowledge deficit related to heart failure  Role Documenting the Problem One  [place] for Problem One  Active  THN Long Term Goal (31-90 days)  Patient will verbalize following heart failure action plan as a daily guide to maintenance within 90 days.   THN Long Term Goal Start Date  04/08/16 [goal continued]  Interventions for Problem One Long Term Goal  RN Health Coach reviewed with patient/caregiver heart failure action plan.      RN Health Coach will contact patient in the month of June and patient agrees to next outreach.  Jone Baseman, RN, MSN [place] [phone]

## 2016-04-14 ENCOUNTER — Other Ambulatory Visit: Payer: Self-pay | Admitting: Internal Medicine

## 2016-04-14 DIAGNOSIS — N644 Mastodynia: Secondary | ICD-10-CM

## 2016-04-16 ENCOUNTER — Other Ambulatory Visit: Payer: Self-pay | Admitting: Internal Medicine

## 2016-04-16 ENCOUNTER — Ambulatory Visit
Admission: RE | Admit: 2016-04-16 | Discharge: 2016-04-16 | Disposition: A | Discharge status: Home / Self Care | Payer: Medicare Other | Source: Ambulatory Visit | Attending: Internal Medicine | Admitting: Internal Medicine

## 2016-04-16 DIAGNOSIS — N644 Mastodynia: Secondary | ICD-10-CM

## 2016-04-16 DIAGNOSIS — R928 Other abnormal and inconclusive findings on diagnostic imaging of breast: Secondary | ICD-10-CM | POA: Diagnosis not present

## 2016-04-25 ENCOUNTER — Ambulatory Visit: Payer: Self-pay

## 2016-05-06 DIAGNOSIS — M25552 Pain in left hip: Secondary | ICD-10-CM | POA: Diagnosis not present

## 2016-05-07 ENCOUNTER — Encounter (HOSPITAL_COMMUNITY): Payer: Self-pay | Admitting: *Deleted

## 2016-05-07 ENCOUNTER — Emergency Department (HOSPITAL_COMMUNITY): Payer: Medicare Other

## 2016-05-07 ENCOUNTER — Emergency Department (HOSPITAL_COMMUNITY)
Admission: EM | Admit: 2016-05-07 | Discharge: 2016-05-07 | Disposition: A | Discharge status: Home / Self Care | Payer: Medicare Other | Attending: Physician Assistant | Admitting: Physician Assistant

## 2016-05-07 DIAGNOSIS — M549 Dorsalgia, unspecified: Secondary | ICD-10-CM | POA: Diagnosis not present

## 2016-05-07 DIAGNOSIS — N183 Chronic kidney disease, stage 3 (moderate): Secondary | ICD-10-CM | POA: Diagnosis not present

## 2016-05-07 DIAGNOSIS — Z7901 Long term (current) use of anticoagulants: Secondary | ICD-10-CM | POA: Insufficient documentation

## 2016-05-07 DIAGNOSIS — M545 Low back pain, unspecified: Secondary | ICD-10-CM

## 2016-05-07 DIAGNOSIS — I130 Hypertensive heart and chronic kidney disease with heart failure and stage 1 through stage 4 chronic kidney disease, or unspecified chronic kidney disease: Secondary | ICD-10-CM | POA: Diagnosis not present

## 2016-05-07 DIAGNOSIS — N390 Urinary tract infection, site not specified: Secondary | ICD-10-CM

## 2016-05-07 DIAGNOSIS — M25552 Pain in left hip: Secondary | ICD-10-CM | POA: Diagnosis not present

## 2016-05-07 DIAGNOSIS — I5032 Chronic diastolic (congestive) heart failure: Secondary | ICD-10-CM | POA: Insufficient documentation

## 2016-05-07 LAB — URINALYSIS, ROUTINE W REFLEX MICROSCOPIC
Bacteria, UA: NONE SEEN
Bilirubin Urine: NEGATIVE
GLUCOSE, UA: NEGATIVE mg/dL
HGB URINE DIPSTICK: NEGATIVE
KETONES UR: 5 mg/dL — AB
NITRITE: NEGATIVE
PROTEIN: 30 mg/dL — AB
Specific Gravity, Urine: 1.025 (ref 1.005–1.030)
pH: 5 (ref 5.0–8.0)

## 2016-05-07 MED ORDER — CEPHALEXIN 500 MG PO CAPS: 500.0000 mg | ORAL_CAPSULE | Freq: Four times a day (QID) | ORAL | 0 refills | Status: DC

## 2016-05-07 MED ORDER — LIDOCAINE 5 % EX PTCH: 1.0000 | MEDICATED_PATCH | CUTANEOUS | 0 refills | Status: DC

## 2016-05-07 MED ORDER — LIDOCAINE 5 % EX PTCH
1.0000 | MEDICATED_PATCH | Freq: Once | CUTANEOUS | Status: DC
  Administered 2016-05-07: 1 via TRANSDERMAL
  Filled 2016-05-07: qty 1

## 2016-05-07 MED ORDER — HYDROCODONE-ACETAMINOPHEN 5-325 MG PO TABS
1.0000 | ORAL_TABLET | Freq: Once | ORAL | Status: AC
  Administered 2016-05-07: 1 via ORAL
  Filled 2016-05-07: qty 1

## 2016-05-07 MED ORDER — TRAMADOL HCL 50 MG PO TABS: 50.0000 mg | ORAL_TABLET | Freq: Four times a day (QID) | ORAL | 0 refills | Status: DC | PRN

## 2016-05-07 NOTE — Discharge Instructions (Signed)
Your urine today shows that you have infection. We are treating you with an antibiotic. We have sent the urine for culture. If we need to change you medication someone will call you.

## 2016-05-07 NOTE — ED Triage Notes (Signed)
Pt reports having left hip pain for several days. Pain increases when bearing weight. Denies injury. Was at pcp recently and diagnosed with muscle strain but reports no relief since then.

## 2016-05-07 NOTE — ED Notes (Signed)
Patient left at this time with all belongings, escorted home by husband.

## 2016-05-07 NOTE — ED Notes (Signed)
Attempted to call husband, mailbox full.

## 2016-05-07 NOTE — ED Notes (Signed)
Lidocaine removed by patient.

## 2016-05-07 NOTE — ED Provider Notes (Signed)
[facility] DEPT Provider Note   CSN: [id number] Arrival date & time: 05/07/16  1322  By signing my name below, I, [name], attest that this documentation has been prepared under the direction and in the presence of [name], PA-C.  Electronically Signed: [name], Scribe. 05/07/2016. 3:53 PM.  History   Chief Complaint Chief Complaint  Patient presents with  . Back Pain   Donna [name] is a 81 y.o. female with a multiple medical co-morbidities who presents to the Emergency Department with complaints of left hip and back pain x2 days with radiation down her left leg. Sx are only present when she is moving or ambulating. Pt reports no trauma, injuries or recent changes in activity.   Pt denies experiencing any other acute sx, including bowel or bladder incontinence, peripheral edema, numbness,Urinary retention, tingling or weakness.  Per pt, she was worked up for similar pain at her PCP and d/c with a dx of muscle strain 2 days ago and given tylenol with little relief. Patient with minimal ambulation due to pain.  No hx of cancer or IV drug use. No hx of back pain. Denies any urinary symptoms. Denies any fevers.  The history is provided by the patient, the spouse and medical records. No language interpreter was used.   Past Medical History:  Diagnosis Date  . Atherosclerosis of renal artery (HCC)    a. s/p R RA stenting;  b. 04/2014 Renal Art duplex: Patent R RA stent, <60% L RA stenosis. Followed by [facility].  . Carotid arterial disease ([place])    a. 01/2014 Carotid U/S: RICA 78%, LICA 24%. Followed by [facility].  . CKD (chronic kidney disease), stage III    Stage II/III  . Essential hypertension   . Fibrocystic breast   . Fibromuscular dysplasia ([place])   . Hyperglycemia   . Mitral regurgitation    a. Echo 08/2014: mild MR.  . Paroxysmal atrial fibrillation ([place])    a. Dx 07/26/2014, CHA2DS2VASc = 5-->Eliquis;  c. 08/2014 Echo: EF 55-60%, no rwma, mildly dil LA/RA,  mod-sev TR, PASP 20mmHg. b. s/p DCCV 08/2014. c. back in atrial flutter 09/2014 -> rate control pursued.  . Paroxysmal atrial flutter ([place])    a. Dx 07/26/2014, CHA2DS2VASc = 5-->Eliquis;  c. 08/2014 Echo: EF 55-60%, no rwma, mildly dil LA/RA, mod-sev TR, PASP 71mmHg. b. s/p DCCV 08/2014. c. back in atrial flutter 09/2014 -> rate control pursued.  . Tricuspid regurgitation    a. Echo 08/2014: mod-severe.   Patient Active Problem List   Diagnosis Date Noted  . Chronic anticoagulation 02/22/2015  . Chronic atrial fibrillation ([place]) 11/19/2014  . Chronic diastolic HF (heart failure) ([place]) 11/19/2014  . CKD (chronic kidney disease), stage III 11/19/2014  . Fibromuscular dysplasia ([place])   . Carotid arterial disease ([place])   . Hypertension 05/28/2011  . Menopause 05/28/2011  . Atherosclerotic RAS (renal artery stenosis), bilateral ([place]) 06/28/2010   Past Surgical History:  Procedure Laterality Date  . CARDIOVERSION N/A 09/01/2014   Procedure: CARDIOVERSION;  Surgeon: Josue Hector, MD;  Location[facility] ENDOSCOPY;  Service: Cardiovascular;  Laterality: N/A;  . CARDIOVERSION N/A 11/23/2014   Procedure: CARDIOVERSION;  Surgeon: Skeet Latch, MD;  Location: [facility];  Service: Cardiovascular;  Laterality: N/A;  . HEMORROIDECTOMY    . REDUCTION MAMMAPLASTY Bilateral   . RENAL ARTERY STENT  2008   Right renal artery by Dr. Drucie Opitz  . TONSILLECTOMY    . TUBAL LIGATION  1970's   OB  History    Gravida Para Term Preterm AB Living   4 4 4  0 0 4   SAB TAB Ectopic Multiple Live Births   0 0 0 0       Home Medications    Prior to Admission medications   Medication Sig Start Date End Date Taking? Authorizing Provider  acetaminophen-codeine (TYLENOL #3) 300-30 MG per tablet Take 1-2 tablets by mouth every 6 (six) hours as needed for moderate pain. Patient not taking: Reported on 12/26/2015 09/05/14   Domenic Moras, PA-C  amLODipine (NORVASC) 5 MG tablet Take 1 tablet (5 mg total) by mouth daily.  12/03/15 03/02/16  Belva Crome, MD  apixaban (ELIQUIS) 5 MG TABS tablet Take 1 tablet (5 mg total) by mouth 2 (two) times daily. 12/03/15   Belva Crome, MD  B Complex-C (SUPER B COMPLEX PO) Take 1 tablet by mouth daily.    Historical Provider, MD  carvedilol (COREG) 6.25 MG tablet TAKE ONE TABLET BY MOUTH TWICE DAILY 02/27/16   Belva Crome, MD  dofetilide [facility]) 125 MCG capsule TAKE THREE CAPSULES BY MOUTH TWICE DAILY 10/05/15   Belva Crome, MD  furosemide (LASIX) 20 MG tablet Take 1 tablet (20 mg total) by mouth daily. 01/14/16   Belva Crome, MD  HYDROcodone-acetaminophen (NORCO/VICODIN) 5-325 MG tablet Take 1 tablet by mouth every 6 (six) hours as needed. 12/29/15   Montine Circle, PA-C  magnesium oxide (MAG-OX) 400 (241.3 Mg) MG tablet Take 1 tablet (400 mg total) by mouth every morning. 01/11/15   Belva Crome, MD  magnesium oxide (MAG-OX) 400 MG tablet Take 1 tablet (400 mg total) by mouth every morning. 01/14/16   Belva Crome, MD  mirtazapine (REMERON) 15 MG tablet Take 15 mg by mouth at bedtime.    Historical Provider, MD  Multiple Vitamin (MULTIVITAMIN WITH MINERALS) TABS tablet Take 1 tablet by mouth daily.    Historical Provider, MD  nitroGLYCERIN (NITROSTAT) 0.4 MG SL tablet Place 1 tablet (0.4 mg total) under the tongue every 5 (five) minutes as needed for chest pain. 11/14/14   Dayna N Dunn, PA-C  potassium chloride SA (KLOR-CON M20) 20 MEQ tablet Take 1 tablet (20 mEq total) by mouth daily. Patient not taking: Reported on 12/26/2015 09/28/14   Charlie Pitter, PA-C   Family History Family History  Problem Relation Age of Onset  . Stroke Mother 6  . Diabetes Mother   . Hypertension Mother   . Aneurysm Father     abdominal aortic aneurysm  . Diabetes Father   . Hypertension Father   . Heart attack Father   . Cancer Sister 17    breast cancer   Social History Social History  Substance Use Topics  . Smoking status: Never Smoker  . Smokeless tobacco: Never Used  .  Alcohol use Yes   Allergies   Tagamet [cimetidine] and Diovan [valsartan]  Review of Systems Review of Systems  Cardiovascular: Negative for leg swelling.  Genitourinary: Negative for difficulty urinating.  Musculoskeletal: Positive for arthralgias, back pain, joint swelling and myalgias. Negative for gait problem.  Neurological: Negative for weakness and numbness.    Physical Exam Updated Vital Signs BP (!) 155/79 (BP Location: Left Arm)   Pulse 68   Temp 97.8 F (36.6 C) (Oral)   Resp 18   SpO2 100%   Physical Exam  Constitutional: She is oriented to person, place, and time. She appears well-developed and well-nourished. No distress.  HENT:  Head:  Normocephalic and atraumatic.  Cardiovascular: Normal rate and intact distal pulses.   Pulmonary/Chest: Effort normal.  Abdominal:  No CVA tenderness  Musculoskeletal:  The patient with midline L-spine tenderness. She has left-sided paraspinal tenderness that radiates to the left buttocks. Positive straight leg raise test on the left. No deformity or step-offs noted. Patient able to stand but has limited ambulation due to pain. Every movement makes the pain worse.  Neurological: She is alert and oriented to person, place, and time.  Strength 5 out of 5 in lower extremity. Reflexes are normal. Sensation intact to sharp dull. DP pulses are 2+ bilaterally. Cap refill normal.  Skin: Skin is warm and dry. Capillary refill takes less than 2 seconds.  Psychiatric: She has a normal mood and affect.  Nursing note and vitals reviewed.    ED Treatments / Results  DIAGNOSTIC STUDIES: Oxygen Saturation is 100% on RA, nl by my interpretation.    COORDINATION OF CARE: 2:36 PM Discussed treatment plan with pt at bedside and pt agreed to plan.  Labs (all labs ordered are listed, but only abnormal results are displayed) Labs Reviewed  URINALYSIS, ROUTINE W REFLEX MICROSCOPIC    EKG  EKG Interpretation None      Radiology Dg  Lumbar Spine Complete  Result Date: 05/07/2016 CLINICAL DATA:  Lumbar pain. EXAM: LUMBAR SPINE - COMPLETE 4+ VIEW COMPARISON:  None. FINDINGS: There is no evidence of lumbar spine fracture. 5 mm anterolisthesis of L3 on L4 secondary to facet disease. Levoscoliosis of the lumbar spine. Mild degenerative disc disease with disc height loss at L2-3, L3-4, L4-5 and L5-S1. Bilateral facet arthropathy throughout the lumbar spine most severe at L4-5 L5-S1. Abdominal aortic atherosclerosis. IMPRESSION: 1. Lumbar spine spondylosis as described above. 2.  No acute osseous injury of the lumbar spine. Electronically Signed   By: Kathreen Devoid   On: 05/07/2016 15:54   Dg Hip Unilat W Or Wo Pelvis 2-3 Views Left  Result Date: 05/07/2016 CLINICAL DATA:  Low back and left hip pain without known injury. EXAM: DG HIP (WITH OR WITHOUT PELVIS) 2-3V LEFT COMPARISON:  None. FINDINGS: There is no evidence of hip fracture or dislocation. There is no evidence of arthropathy or other focal bone abnormality. IMPRESSION: Normal left hip. Electronically Signed   By: Marijo Conception, M.D.   On: 05/07/2016 15:55    Procedures Procedures (including critical care time)  Medications Ordered in ED Medications  lidocaine (LIDODERM) 5 % 1 patch (not administered)  HYDROcodone-acetaminophen (NORCO/VICODIN) 5-325 MG per tablet 1 tablet (1 tablet Oral Given 05/07/16 1610)    Initial Impression / Assessment and Plan / ED Course  I have reviewed the triage vital signs and the nursing notes.  Pertinent labs & imaging results that were available during my care of the patient were reviewed by me and considered in my medical decision making (see chart for details).     Patient resents to the ED with complaints of severe left lower back pain that radiates to the left hip. Patient able to bear weight but has limited ambulation. Patient is neurovascularly. Strength is equal bilaterally. Was seen by her PCP 2 days ago diagnosed with muscle sprain  but symptoms have not resolved. Patient denies any urinary symptoms. No red flag symptoms concerning for cauda equina. X-ray obtained that shows severe lumbar spine spondylosis with disc height loss. Due to patient's significant pain, limited ambulation still that MRI is warranted. Discussed with Dr. Thomasene Lot who is agreeable to the above plan.  awaiting UA sample to rule out infection. However I have low suspicion for UTI or pyelo given patient denies any urinary symptoms, no fever, no nausea, no vomiting. Patient's pain was treated in the ED. Patient is stable at this time. Likely discharge home if MRI finds no acute findings with follow-up with spine surgery. Sign off to [place] awaiting ua and mri.     Final Clinical Impressions(s) / ED Diagnoses   Final diagnoses:  Low back pain    New Prescriptions New Prescriptions   LIDOCAINE (LIDODERM) 5 %    Place 1 patch onto the skin daily. Remove & Discard patch within 12 hours or as directed by MD   TRAMADOL (ULTRAM) 50 MG TABLET    Take 1 tablet (50 mg total) by mouth every 6 (six) hours as needed.   I personally performed the services described in this documentation, which was scribed in my presence. The recorded information has been reviewed and is accurate.    [name], PA-C 05/07/16 1657    Macarthur Critchley, MD 05/11/16 [phone]

## 2016-05-07 NOTE — ED Notes (Signed)
Returned from MRI at this time.

## 2016-05-07 NOTE — ED Notes (Signed)
Patient at xray

## 2016-05-09 LAB — URINE CULTURE

## 2016-05-14 DIAGNOSIS — N390 Urinary tract infection, site not specified: Secondary | ICD-10-CM | POA: Diagnosis not present

## 2016-05-14 DIAGNOSIS — M5136 Other intervertebral disc degeneration, lumbar region: Secondary | ICD-10-CM | POA: Diagnosis not present

## 2016-05-19 ENCOUNTER — Other Ambulatory Visit: Payer: Self-pay | Admitting: Interventional Cardiology

## 2016-05-19 DIAGNOSIS — I480 Paroxysmal atrial fibrillation: Secondary | ICD-10-CM

## 2016-05-19 MED ORDER — DOFETILIDE 125 MCG PO CAPS: ORAL_CAPSULE | ORAL | 1 refills | Status: DC

## 2016-06-09 ENCOUNTER — Other Ambulatory Visit: Payer: Self-pay

## 2016-06-09 NOTE — Patient Outreach (Signed)
[facility]) Care Management  [place]  06/09/2016   SHAWNTAY PREST December 09, 1935 [id number]  Subjective: Telephone call to patient for every other month call and case closure.  Patient reports she is doing good.  She reports no problems with her heart failure. Discussed with patient heart failure symptoms and when to notify physician.  She verbalized understanding. Patient agrees with case closure.    Objective:   Encounter Medications:  Outpatient Encounter Prescriptions as of 06/09/2016  Medication Sig  . apixaban (ELIQUIS) 5 MG TABS tablet Take 1 tablet (5 mg total) by mouth 2 (two) times daily.  . B Complex-C (SUPER B COMPLEX PO) Take 1 tablet by mouth daily.  . carvedilol (COREG) 6.25 MG tablet TAKE ONE TABLET BY MOUTH TWICE DAILY  . dofetilide (TIKOSYN) 125 MCG capsule TAKE THREE CAPSULES BY MOUTH TWICE DAILY  . furosemide (LASIX) 20 MG tablet Take 1 tablet (20 mg total) by mouth daily.  Marland Kitchen HYDROcodone-acetaminophen (NORCO/VICODIN) 5-325 MG tablet Take 1 tablet by mouth every 6 (six) hours as needed.  . lidocaine (LIDODERM) 5 % Place 1 patch onto the skin daily. Remove & Discard patch within 12 hours or as directed by MD  . magnesium oxide (MAG-OX) 400 (241.3 Mg) MG tablet Take 1 tablet (400 mg total) by mouth every morning.  . magnesium oxide (MAG-OX) 400 MG tablet Take 1 tablet (400 mg total) by mouth every morning.  . mirtazapine (REMERON) 15 MG tablet Take 15 mg by mouth at bedtime.  . Multiple Vitamin (MULTIVITAMIN WITH MINERALS) TABS tablet Take 1 tablet by mouth daily.  . nitroGLYCERIN (NITROSTAT) 0.4 MG SL tablet Place 1 tablet (0.4 mg total) under the tongue every 5 (five) minutes as needed for chest pain.  . potassium chloride SA (KLOR-CON M20) 20 MEQ tablet Take 1 tablet (20 mEq total) by mouth daily.  . traMADol (ULTRAM) 50 MG tablet Take 1 tablet (50 mg total) by mouth every 6 (six) hours as needed.  Marland Kitchen amLODipine (NORVASC) 5 MG tablet Take 1 tablet  (5 mg total) by mouth daily.  . cephALEXin (KEFLEX) 500 MG capsule Take 1 capsule (500 mg total) by mouth 4 (four) times daily. (Patient not taking: Reported on 06/09/2016)   No facility-administered encounter medications on file as of 06/09/2016.     Functional Status:  No flowsheet data found.  Fall/Depression Screening: Fall Risk  06/09/2016 04/08/2016 02/08/2016  Falls in the past year? No No No   PHQ 2/9 Scores 06/09/2016 04/08/2016 02/08/2016 12/18/2015 11/16/2015 10/26/2015 09/21/2015  PHQ - 2 Score 0 0 0 0 0 0 0    Assessment: Patient has met goals of care.  Plan:  [facility] CM Care Plan Problem One     Most Recent Value  Care Plan Problem One  knowledge deficit related to heart failure  Role Documenting the Problem One  [place] for Problem One  Active  [facility] Long Term Goal   Patient will verbalize following heart failure action plan as a daily guide to maintenance within 90 days.   [facility] Long Term Goal Start Date  04/08/16 [goal continued]  [facility] Long Term Goal Met Date  06/09/16  Interventions for Problem One [place] reviewed with patient/caregiver heart failure action plan.      RN Health Coach will send physician case closure letter. RN Health Coach will notify care management assistant of case status.   Jone Baseman, RN, MSN [facility] Care  Management RN Telephonic Health Coach [phone]

## 2016-06-11 DIAGNOSIS — R0789 Other chest pain: Secondary | ICD-10-CM | POA: Diagnosis not present

## 2016-08-06 DIAGNOSIS — Z23 Encounter for immunization: Secondary | ICD-10-CM | POA: Diagnosis not present

## 2016-08-06 DIAGNOSIS — Z1389 Encounter for screening for other disorder: Secondary | ICD-10-CM | POA: Diagnosis not present

## 2016-08-06 DIAGNOSIS — Z0000 Encounter for general adult medical examination without abnormal findings: Secondary | ICD-10-CM | POA: Diagnosis not present

## 2016-08-06 DIAGNOSIS — I10 Essential (primary) hypertension: Secondary | ICD-10-CM | POA: Diagnosis not present

## 2016-08-06 DIAGNOSIS — I509 Heart failure, unspecified: Secondary | ICD-10-CM | POA: Diagnosis not present

## 2016-08-06 DIAGNOSIS — R413 Other amnesia: Secondary | ICD-10-CM | POA: Diagnosis not present

## 2016-08-06 DIAGNOSIS — I4891 Unspecified atrial fibrillation: Secondary | ICD-10-CM | POA: Diagnosis not present

## 2016-08-06 DIAGNOSIS — R0789 Other chest pain: Secondary | ICD-10-CM | POA: Diagnosis not present

## 2016-08-06 DIAGNOSIS — F341 Dysthymic disorder: Secondary | ICD-10-CM | POA: Diagnosis not present

## 2016-08-06 DIAGNOSIS — M5136 Other intervertebral disc degeneration, lumbar region: Secondary | ICD-10-CM | POA: Diagnosis not present

## 2016-08-12 ENCOUNTER — Other Ambulatory Visit: Payer: Self-pay | Admitting: Interventional Cardiology

## 2016-08-12 DIAGNOSIS — I480 Paroxysmal atrial fibrillation: Secondary | ICD-10-CM

## 2016-08-12 MED ORDER — DOFETILIDE 125 MCG PO CAPS: ORAL_CAPSULE | ORAL | 0 refills | Status: DC

## 2016-08-13 DIAGNOSIS — I4891 Unspecified atrial fibrillation: Secondary | ICD-10-CM | POA: Diagnosis not present

## 2016-08-13 DIAGNOSIS — R413 Other amnesia: Secondary | ICD-10-CM | POA: Diagnosis not present

## 2016-08-29 ENCOUNTER — Ambulatory Visit: Payer: Medicare Other

## 2016-09-17 ENCOUNTER — Emergency Department (HOSPITAL_COMMUNITY): Payer: Medicare Other

## 2016-09-17 ENCOUNTER — Emergency Department (HOSPITAL_COMMUNITY)
Admission: EM | Admit: 2016-09-17 | Discharge: 2016-09-17 | Disposition: A | Discharge status: Home / Self Care | Payer: Medicare Other | Attending: Emergency Medicine | Admitting: Emergency Medicine

## 2016-09-17 ENCOUNTER — Encounter (HOSPITAL_COMMUNITY): Payer: Self-pay | Admitting: Emergency Medicine

## 2016-09-17 DIAGNOSIS — M25551 Pain in right hip: Secondary | ICD-10-CM | POA: Diagnosis not present

## 2016-09-17 DIAGNOSIS — I129 Hypertensive chronic kidney disease with stage 1 through stage 4 chronic kidney disease, or unspecified chronic kidney disease: Secondary | ICD-10-CM | POA: Diagnosis not present

## 2016-09-17 DIAGNOSIS — L929 Granulomatous disorder of the skin and subcutaneous tissue, unspecified: Secondary | ICD-10-CM | POA: Insufficient documentation

## 2016-09-17 DIAGNOSIS — Z7901 Long term (current) use of anticoagulants: Secondary | ICD-10-CM | POA: Diagnosis not present

## 2016-09-17 DIAGNOSIS — I2510 Atherosclerotic heart disease of native coronary artery without angina pectoris: Secondary | ICD-10-CM | POA: Insufficient documentation

## 2016-09-17 DIAGNOSIS — Z79899 Other long term (current) drug therapy: Secondary | ICD-10-CM | POA: Insufficient documentation

## 2016-09-17 DIAGNOSIS — N183 Chronic kidney disease, stage 3 (moderate): Secondary | ICD-10-CM | POA: Insufficient documentation

## 2016-09-17 MED ORDER — ACETAMINOPHEN 500 MG PO TABS
1000.0000 mg | ORAL_TABLET | Freq: Once | ORAL | Status: AC
  Administered 2016-09-17: 1000 mg via ORAL
  Filled 2016-09-17: qty 2

## 2016-09-17 NOTE — ED Notes (Signed)
Pt in CT.

## 2016-09-17 NOTE — ED Provider Notes (Signed)
[facility] DEPT Provider Note   CSN: [id number] Arrival date & time: 09/17/16  1400     History   Chief Complaint Chief Complaint  Patient presents with  . Hip Pain    HPI Donna [name] is a 81 y.o. female who presents emergency department for evaluation of hip pain on the right. Patient states that she's been having some achiness in her hip with however it became acutely worse today. She complains of pain in the right gluteal tissue in the right hip. It is isolated to that region does not radiate. She describes it as deep and aching. She denies any traumas to that region. She is on ELIQUIS for chronic atrial fibrillation. She denies any recent injections in the buttocks. Patient denies weakness, numbness or loss of bowel or bladder control.  HPI  Past Medical History:  Diagnosis Date  . Atherosclerosis of renal artery (HCC)    a. s/p R RA stenting;  b. 04/2014 Renal Art duplex: Patent R RA stent, <60% L RA stenosis. [facility] by [facility].  . Carotid arterial disease ([place])    a. 01/2014 Carotid U/S: RICA 75%, LICA 10%. Followed by [facility].  . CKD (chronic kidney disease), stage III    Stage II/III  . Essential hypertension   . Fibrocystic breast   . Fibromuscular dysplasia ([place])   . Hyperglycemia   . Mitral regurgitation    a. Echo 08/2014: mild MR.  . Paroxysmal atrial fibrillation ([place])    a. Dx 07/26/2014, CHA2DS2VASc = 5-->Eliquis;  c. 08/2014 Echo: EF 55-60%, no rwma, mildly dil LA/RA, mod-sev TR, PASP 49mmHg. b. s/p DCCV 08/2014. c. back in atrial flutter 09/2014 -> rate control pursued.  . Paroxysmal atrial flutter ([place])    a. Dx 07/26/2014, CHA2DS2VASc = 5-->Eliquis;  c. 08/2014 Echo: EF 55-60%, no rwma, mildly dil LA/RA, mod-sev TR, PASP 12mmHg. b. s/p DCCV 08/2014. c. back in atrial flutter 09/2014 -> rate control pursued.  . Tricuspid regurgitation    a. Echo 08/2014: mod-severe.    Patient Active Problem List   Diagnosis Date Noted  . Chronic anticoagulation 02/22/2015  .  Chronic atrial fibrillation ([place]) 11/19/2014  . Chronic diastolic HF (heart failure) ([place]) 11/19/2014  . CKD (chronic kidney disease), stage III 11/19/2014  . Fibromuscular dysplasia ([place])   . Carotid arterial disease ([place])   . Hypertension 05/28/2011  . Menopause 05/28/2011  . Atherosclerotic RAS (renal artery stenosis), bilateral ([place]) 06/28/2010    Past Surgical History:  Procedure Laterality Date  . CARDIOVERSION N/A 09/01/2014   Procedure: CARDIOVERSION;  Surgeon: Josue Hector, MD;  Location[facility] ENDOSCOPY;  Service: Cardiovascular;  Laterality: N/A;  . CARDIOVERSION N/A 11/23/2014   Procedure: CARDIOVERSION;  Surgeon: Skeet Latch, MD;  Location: [facility];  Service: Cardiovascular;  Laterality: N/A;  . HEMORROIDECTOMY    . REDUCTION MAMMAPLASTY Bilateral   . RENAL ARTERY STENT  2008   Right renal artery by Dr. Drucie Opitz  . TONSILLECTOMY    . TUBAL LIGATION  1970's    OB History    Gravida Para Term Preterm AB Living   4 4 4  0 0 4   SAB TAB Ectopic Multiple Live Births   0 0 0 0         Home Medications    Prior to Admission medications   Medication Sig Start Date End Date Taking? Authorizing Provider  amLODipine (NORVASC) 5 MG tablet Take 1 tablet (5 mg total) by mouth daily. 12/03/15 03/02/16  Belva Crome,  MD  apixaban (ELIQUIS) 5 MG TABS tablet Take 1 tablet (5 mg total) by mouth 2 (two) times daily. 12/03/15   Belva Crome, MD  B Complex-C (SUPER B COMPLEX PO) Take 1 tablet by mouth daily.    [provider]  carvedilol (COREG) 6.25 MG tablet TAKE ONE TABLET BY MOUTH TWICE DAILY 02/27/16   Belva Crome, MD  cephALEXin (KEFLEX) 500 MG capsule Take 1 capsule (500 mg total) by mouth 4 (four) times daily. Patient not taking: Reported on 06/09/2016 05/07/16   Ashley Murrain, NP  dofetilide [facility]) 125 MCG capsule TAKE THREE CAPSULES BY MOUTH TWICE DAILY 08/12/16   Belva Crome, MD  furosemide (LASIX) 20 MG tablet Take 1 tablet (20 mg total) by mouth  daily. 01/14/16   Belva Crome, MD  HYDROcodone-acetaminophen (NORCO/VICODIN) 5-325 MG tablet Take 1 tablet by mouth every 6 (six) hours as needed. 12/29/15   Montine Circle, PA-C  lidocaine (LIDODERM) 5 % Place 1 patch onto the skin daily. Remove & Discard patch within 12 hours or as directed by MD 05/07/16   Ocie Cornfield T, PA-C  magnesium oxide (MAG-OX) 400 (241.3 Mg) MG tablet Take 1 tablet (400 mg total) by mouth every morning. 01/11/15   Belva Crome, MD  magnesium oxide (MAG-OX) 400 MG tablet Take 1 tablet (400 mg total) by mouth every morning. 01/14/16   Belva Crome, MD  mirtazapine (REMERON) 15 MG tablet Take 15 mg by mouth at bedtime.    [provider]  Multiple Vitamin (MULTIVITAMIN WITH MINERALS) TABS tablet Take 1 tablet by mouth daily.    [provider]  nitroGLYCERIN (NITROSTAT) 0.4 MG SL tablet Place 1 tablet (0.4 mg total) under the tongue every 5 (five) minutes as needed for chest pain. 11/14/14   Dunn, Nedra Hai, PA-C  potassium chloride SA (KLOR-CON M20) 20 MEQ tablet Take 1 tablet (20 mEq total) by mouth daily. 09/28/14   Dunn, Nedra Hai, PA-C  traMADol (ULTRAM) 50 MG tablet Take 1 tablet (50 mg total) by mouth every 6 (six) hours as needed. 05/07/16   [name], PA-C    Family History Family History  Problem Relation Age of Onset  . Stroke Mother 31  . Diabetes Mother   . Hypertension Mother   . Aneurysm Father        abdominal aortic aneurysm  . Diabetes Father   . Hypertension Father   . Heart attack Father   . Cancer Sister 33       breast cancer    Social History Social History  Substance Use Topics  . Smoking status: Never Smoker  . Smokeless tobacco: Never Used  . Alcohol use Yes     Allergies   Tagamet [cimetidine] and Diovan [valsartan]   Review of Systems Review of Systems  Ten systems reviewed and are negative for acute change, except as noted in the HPI.   Physical Exam Updated Vital Signs BP (!) 153/62    Pulse (!) 53   Temp (!) 97.5 F (36.4 C) (Oral)   Resp 15   SpO2 99%   Physical Exam Physical Exam  Nursing note and vitals reviewed. Constitutional: She is oriented to person, place, and time. She appears well-developed and well-nourished. No distress.  HENT:  Head: Normocephalic and atraumatic.  Eyes: Conjunctivae normal and EOM are normal. Pupils are equal, round, and reactive to light. No scleral icterus.  Neck: Normal range of motion.  Cardiovascular: Normal rate, regular  rhythm and normal heart sounds.  Exam reveals no gallop and no friction rub.   No murmur heard. Pulmonary/Chest: Effort normal and breath sounds normal. No respiratory distress.  Abdominal: Soft. Bowel sounds are normal. She exhibits no distension and no mass. There is no tenderness. There is no guarding.  Musculoskeletal: Full range of motion of the right hip. No weakness. There is a well circumscribed ping-pong ball size mass just posterior to the trochanter it is firm and tender to palpation  Neurological: She is alert and oriented to person, place, and time.  Skin: Skin is warm and dry. She is not diaphoretic.     ED Treatments / Results  Labs (all labs ordered are listed, but only abnormal results are displayed) Labs Reviewed - No data to display  EKG  EKG Interpretation None       Radiology No results found.  Procedures Procedures (including critical care time)  Medications Ordered in ED Medications - No data to display   Initial Impression / Assessment and Plan / ED Course  I have reviewed the triage vital signs and the nursing notes.  Pertinent labs & imaging results that were available during my care of the patient were reviewed by me and considered in my medical decision making (see chart for details).     Patient with mass in the gluteal tissue and hip pain. Seen in shared visit with Dr. Maryan Rued. Patient will get a CT scan of the hip without contrast secondary to her chronic  kidney disease.  Patient with injection granuloma of the right hip. No signs of infection. Patient will be discharged with supportive care and outpatient follow-up. Seen in shared visit with attending physician. She appears safe for discharge at this time.  Final Clinical Impressions(s) / ED Diagnoses   Final diagnoses:  Granulomatosis    New Prescriptions New Prescriptions   No medications on file     [name], PA-C 09/20/16 1637    Blanchie Dessert, MD 09/21/16 1640

## 2016-09-17 NOTE — ED Triage Notes (Signed)
Pt reports right hip pain since yesterday, denies any injuries, states pain feels sore.

## 2016-09-17 NOTE — Discharge Instructions (Signed)
Use tylenol and warm compresses Follow up with your primary care doctor Return to the er if you are unable to walk, develop signs of infection such as heat, redness, fever, or severe pain

## 2016-10-09 ENCOUNTER — Ambulatory Visit
Admission: RE | Admit: 2016-10-09 | Discharge: 2016-10-09 | Disposition: A | Discharge status: Home / Self Care | Payer: Medicare Other | Source: Ambulatory Visit | Attending: Internal Medicine | Admitting: Internal Medicine

## 2016-10-09 DIAGNOSIS — Z9289 Personal history of other medical treatment: Secondary | ICD-10-CM

## 2016-10-09 DIAGNOSIS — Z1231 Encounter for screening mammogram for malignant neoplasm of breast: Secondary | ICD-10-CM | POA: Diagnosis not present

## 2016-11-03 DIAGNOSIS — I10 Essential (primary) hypertension: Secondary | ICD-10-CM | POA: Diagnosis not present

## 2016-11-03 DIAGNOSIS — M5136 Other intervertebral disc degeneration, lumbar region: Secondary | ICD-10-CM | POA: Diagnosis not present

## 2016-11-10 ENCOUNTER — Emergency Department (HOSPITAL_COMMUNITY)
Admission: EM | Admit: 2016-11-10 | Discharge: 2016-11-10 | Disposition: A | Discharge status: Home / Self Care | Payer: Medicare Other | Attending: Emergency Medicine | Admitting: Emergency Medicine

## 2016-11-10 ENCOUNTER — Emergency Department (HOSPITAL_COMMUNITY): Payer: Medicare Other

## 2016-11-10 DIAGNOSIS — N183 Chronic kidney disease, stage 3 (moderate): Secondary | ICD-10-CM | POA: Insufficient documentation

## 2016-11-10 DIAGNOSIS — I5032 Chronic diastolic (congestive) heart failure: Secondary | ICD-10-CM | POA: Diagnosis not present

## 2016-11-10 DIAGNOSIS — I2510 Atherosclerotic heart disease of native coronary artery without angina pectoris: Secondary | ICD-10-CM | POA: Insufficient documentation

## 2016-11-10 DIAGNOSIS — R079 Chest pain, unspecified: Secondary | ICD-10-CM | POA: Diagnosis not present

## 2016-11-10 DIAGNOSIS — Z79899 Other long term (current) drug therapy: Secondary | ICD-10-CM | POA: Insufficient documentation

## 2016-11-10 DIAGNOSIS — I130 Hypertensive heart and chronic kidney disease with heart failure and stage 1 through stage 4 chronic kidney disease, or unspecified chronic kidney disease: Secondary | ICD-10-CM | POA: Insufficient documentation

## 2016-11-10 DIAGNOSIS — Z7901 Long term (current) use of anticoagulants: Secondary | ICD-10-CM | POA: Insufficient documentation

## 2016-11-10 DIAGNOSIS — R0789 Other chest pain: Secondary | ICD-10-CM | POA: Diagnosis not present

## 2016-11-10 LAB — CBC
HEMATOCRIT: 41.2 % (ref 36.0–46.0)
Hemoglobin: 14 g/dL (ref 12.0–15.0)
MCH: 32 pg (ref 26.0–34.0)
MCHC: 34 g/dL (ref 30.0–36.0)
MCV: 94.3 fL (ref 78.0–100.0)
Platelets: 159 10*3/uL (ref 150–400)
RBC: 4.37 MIL/uL (ref 3.87–5.11)
RDW: 14.2 % (ref 11.5–15.5)
WBC: 4 10*3/uL (ref 4.0–10.5)

## 2016-11-10 LAB — BASIC METABOLIC PANEL
Anion gap: 7 (ref 5–15)
BUN: 15 mg/dL (ref 6–20)
CHLORIDE: 107 mmol/L (ref 101–111)
CO2: 25 mmol/L (ref 22–32)
Calcium: 9.8 mg/dL (ref 8.9–10.3)
Creatinine, Ser: 0.89 mg/dL (ref 0.44–1.00)
GFR calc Af Amer: >60 mL/min (ref >60)
GFR calc non Af Amer: >60 mL/min (ref >60)
GLUCOSE: 135 mg/dL — AB (ref 65–99)
POTASSIUM: 4 mmol/L (ref 3.5–5.1)
Sodium: 139 mmol/L (ref 135–145)

## 2016-11-10 LAB — TROPONIN I: Troponin I: <0.03 ng/mL (ref <0.03)

## 2016-11-10 LAB — I-STAT TROPONIN, ED
TROPONIN I, POC: 0.07 ng/mL (ref 0.00–0.08)
TROPONIN I, POC: 0 ng/mL (ref 0.00–0.08)
Troponin i, poc: 0 ng/mL (ref 0.00–0.08)

## 2016-11-10 MED ORDER — ASPIRIN 81 MG PO CHEW
324.0000 mg | CHEWABLE_TABLET | Freq: Once | ORAL | Status: AC
  Administered 2016-11-10: 324 mg via ORAL
  Filled 2016-11-10: qty 4

## 2016-11-10 NOTE — ED Triage Notes (Signed)
Chest pain both breast hurting x 1 month  No lumps or bumps no drainage , no n/v/sob  No injury she states ,or heavy lifting

## 2016-11-10 NOTE — ED Provider Notes (Signed)
[facility] EMERGENCY DEPARTMENT Provider Note   CSN: [id number] Arrival date & time: 11/10/16  7062     History   Chief Complaint Chief Complaint  Patient presents with  . Breast Pain  . Chest Pain    HPI Donna [name] is a 81 y.o. female.  81 yo F with a chief complaint of chest pain.  This is been an ongoing issue for her.  She sees Daneen Schick is her cardiologist for atrial fibrillation.  She has been taking a medication to keep her heart slow.  The family thinks it is to improve this pain.  They do not think that the medication has helped for the pain.  She is unsure what makes it better or worse.  Denies radiation of the pain.  Denies shortness of breath denies diaphoresis denies nausea or vomiting.  Denies hemoptysis denies lower extremity edema.  Denies history of PE or DVT.  Has a history of hypertension hyperlipidemia.  Denies smoking diabetes or family history.  Denies prior MI.   The history is provided by the patient and the spouse.  Chest Pain   This is a new problem. The current episode started 2 days ago. The problem occurs constantly. The problem has not changed since onset.The pain is present in the substernal region. The pain is at a severity of 9/10. The pain is severe. The quality of the pain is described as sharp. The pain does not radiate. Duration of episode(s) is 2 days. Pertinent negatives include no dizziness, no fever, no headaches, no nausea, no palpitations, no shortness of breath and no vomiting. She has tried nothing for the symptoms. The treatment provided no relief.  Her past medical history is significant for hyperlipidemia and hypertension.  Pertinent negatives for past medical history include no diabetes, no DVT, no MI and no PE.  Pertinent negatives for family medical history include: no early MI.    Past Medical History:  Diagnosis Date  . Atherosclerosis of renal artery (HCC)    a. s/p R RA stenting;  b. 04/2014 Renal Art  duplex: Patent R RA stent, <60% L RA stenosis. Follow[facility] by [facility].  . Carotid arterial disease ([place])    a. 01/2014 Carotid U/S: RICA 37%, LICA 62%. Followed by [facility].  . CKD (chronic kidney disease), stage III (HCC)    Stage II/III  . Essential hypertension   . Fibrocystic breast   . Fibromuscular dysplasia ([place])   . Hyperglycemia   . Mitral regurgitation    a. Echo 08/2014: mild MR.  . Paroxysmal atrial fibrillation ([place])    a. Dx 07/26/2014, CHA2DS2VASc = 5-->Eliquis;  c. 08/2014 Echo: EF 55-60%, no rwma, mildly dil LA/RA, mod-sev TR, PASP 20mmHg. b. s/p DCCV 08/2014. c. back in atrial flutter 09/2014 -> rate control pursued.  . Paroxysmal atrial flutter ([place])    a. Dx 07/26/2014, CHA2DS2VASc = 5-->Eliquis;  c. 08/2014 Echo: EF 55-60%, no rwma, mildly dil LA/RA, mod-sev TR, PASP 44mmHg. b. s/p DCCV 08/2014. c. back in atrial flutter 09/2014 -> rate control pursued.  . Tricuspid regurgitation    a. Echo 08/2014: mod-severe.    Patient Active Problem List   Diagnosis Date Noted  . Chronic anticoagulation 02/22/2015  . Chronic atrial fibrillation ([place]) 11/19/2014  . Chronic diastolic HF (heart failure) ([place]) 11/19/2014  . CKD (chronic kidney disease), stage III ([place]) 11/19/2014  . Fibromuscular dysplasia ([place])   . Carotid arterial disease ([place])   . Hypertension 05/28/2011  . Menopause 05/28/2011  .  Atherosclerotic RAS (renal artery stenosis), bilateral ([place]) 06/28/2010    Past Surgical History:  Procedure Laterality Date  . HEMORROIDECTOMY    . REDUCTION MAMMAPLASTY Bilateral   . RENAL ARTERY STENT  2008   Right renal artery by Dr. Drucie Opitz  . TONSILLECTOMY    . TUBAL LIGATION  1970's    OB History    Gravida Para Term Preterm AB Living   4 4 4  0 0 4   SAB TAB Ectopic Multiple Live Births   0 0 0 0         Home Medications    Prior to Admission medications   Medication Sig Start Date End Date Taking? Authorizing Provider  acetaminophen (TYLENOL) 650 MG CR tablet Take 650 mg  every 8 (eight) hours as needed by mouth for pain.   Yes [provider]  amLODipine (NORVASC) 5 MG tablet Take 1 tablet (5 mg total) by mouth daily. 12/03/15 11/10/16 Yes Belva Crome, MD  apixaban (ELIQUIS) 5 MG TABS tablet Take 1 tablet (5 mg total) by mouth 2 (two) times daily. 12/03/15  Yes Belva Crome, MD  B Complex-C (SUPER B COMPLEX PO) Take 1 tablet by mouth daily.   Yes [provider]  Calcium-Magnesium-Vitamin D (CALCIUM 1200+D3 PO) Take 1 tablet daily by mouth.   Yes [provider]  carvedilol (COREG) 6.25 MG tablet TAKE ONE TABLET BY MOUTH TWICE DAILY 02/27/16  Yes Belva Crome, MD  dofetilide [facility]) 125 MCG capsule TAKE THREE CAPSULES BY MOUTH TWICE DAILY 08/12/16  Yes Belva Crome, MD  furosemide (LASIX) 20 MG tablet Take 1 tablet (20 mg total) by mouth daily. 01/14/16  Yes Belva Crome, MD  magnesium oxide (MAG-OX) 400 (241.3 Mg) MG tablet Take 1 tablet (400 mg total) by mouth every morning. 01/11/15  Yes Belva Crome, MD  mirtazapine (REMERON) 15 MG tablet Take 15 mg by mouth at bedtime.   Yes [provider]  Multiple Vitamin (MULTIVITAMIN WITH MINERALS) TABS tablet Take 1 tablet by mouth daily.   Yes [provider]  NAMZARIC 28-10 MG CP24 Take 1 capsule at bedtime by mouth. 11/07/16  Yes [provider]  potassium chloride SA (KLOR-CON M20) 20 MEQ tablet Take 1 tablet (20 mEq total) by mouth daily. 09/28/14  Yes Dunn, Dayna N, PA-C  cephALEXin (KEFLEX) 500 MG capsule Take 1 capsule (500 mg total) by mouth 4 (four) times daily. Patient not taking: Reported on 06/09/2016 05/07/16   Ashley Murrain, NP  HYDROcodone-acetaminophen (NORCO/VICODIN) 5-325 MG tablet Take 1 tablet by mouth every 6 (six) hours as needed. Patient not taking: Reported on 11/10/2016 12/29/15   Montine Circle, PA-C  lidocaine (LIDODERM) 5 % Place 1 patch onto the skin daily. Remove & Discard patch within 12 hours or as directed by MD Patient not taking:  Reported on 11/10/2016 05/07/16   Ocie Cornfield T, PA-C  magnesium oxide (MAG-OX) 400 MG tablet Take 1 tablet (400 mg total) by mouth every morning. 01/14/16   Belva Crome, MD  nitroGLYCERIN (NITROSTAT) 0.4 MG SL tablet Place 1 tablet (0.4 mg total) under the tongue every 5 (five) minutes as needed for chest pain. 11/14/14   Dunn, Nedra Hai, PA-C  traMADol (ULTRAM) 50 MG tablet Take 1 tablet (50 mg total) by mouth every 6 (six) hours as needed. Patient not taking: Reported on 11/10/2016 05/07/16   [name], PA-C    Family History Family History  Problem Relation Age  of Onset  . Stroke Mother 39  . Diabetes Mother   . Hypertension Mother   . Aneurysm Father        abdominal aortic aneurysm  . Diabetes Father   . Hypertension Father   . Heart attack Father   . Cancer Sister 64       breast cancer  . Breast cancer Neg Hx     Social History Social History   Tobacco Use  . Smoking status: Never Smoker  . Smokeless tobacco: Never Used  Substance Use Topics  . Alcohol use: Yes  . Drug use: No     Allergies   Tagamet [cimetidine] and Diovan [valsartan]   Review of Systems Review of Systems  Constitutional: Negative for chills and fever.  HENT: Negative for congestion and rhinorrhea.   Eyes: Negative for redness and visual disturbance.  Respiratory: Negative for shortness of breath and wheezing.   Cardiovascular: Positive for chest pain. Negative for palpitations.  Gastrointestinal: Negative for nausea and vomiting.  Genitourinary: Negative for dysuria and urgency.  Musculoskeletal: Negative for arthralgias and myalgias.  Skin: Negative for pallor and wound.  Neurological: Negative for dizziness and headaches.     Physical Exam Updated Vital Signs BP 129/75   Pulse (!) 56   Temp 97.7 F (36.5 C) (Oral)   Resp 17   Ht 5\' 3"  (1.6 m)   Wt 56.7 kg (125 lb)   SpO2 99%   BMI 22.14 kg/m   Physical Exam  Constitutional: She is oriented to person, place, and  time. She appears well-developed and well-nourished. No distress.  HENT:  Head: Normocephalic and atraumatic.  Eyes: EOM are normal. Pupils are equal, round, and reactive to light.  Neck: Normal range of motion. Neck supple.  Cardiovascular: Normal rate and regular rhythm. Exam reveals no gallop and no friction rub.  No murmur heard. Pulmonary/Chest: Effort normal. She has no wheezes. She has no rales.        Abdominal: Soft. She exhibits no distension. There is no tenderness.  Musculoskeletal: She exhibits no edema or tenderness.  Neurological: She is alert and oriented to person, place, and time.  Skin: Skin is warm and dry. She is not diaphoretic.  Psychiatric: She has a normal mood and affect. Her behavior is normal.  Nursing note and vitals reviewed.    ED Treatments / Results  Labs (all labs ordered are listed, but only abnormal results are displayed) Labs Reviewed  BASIC METABOLIC PANEL - Abnormal; Notable for the following components:      Result Value   Glucose, Bld 135 (*)    All other components within normal limits  CBC  TROPONIN I  I-STAT TROPONIN, ED  I-STAT TROPONIN, ED  I-STAT TROPONIN, ED    EKG  EKG Interpretation  Date/Time:  Monday November 10 2016 09:41:36 EST Ventricular Rate:  52 PR Interval:  234 QRS Duration: 88 QT Interval:  508 QTC Calculation: 472 R Axis:   74 Text Interpretation:  Sinus bradycardia with 1st degree A-V block RSR' or QR pattern in V1 suggests right ventricular conduction delay Septal infarct , age undetermined Abnormal ECG slope change to the st segments in the anterior leads Otherwise no significant change Confirmed by Deno Etienne [phone]) on 11/10/2016 11:16:12 AM       Radiology Dg Chest 2 View  Result Date: 11/10/2016 CLINICAL DATA:  Chest pain for 1 day EXAM: CHEST  2 VIEW COMPARISON:  12/26/2015 FINDINGS: Cardiac shadow is within normal limits. Aortic  calcifications are again seen and stable. The lungs are well  aerated bilaterally. No focal infiltrate or sizable effusion is seen. IMPRESSION: No active cardiopulmonary disease. Electronically Signed   By: Inez Catalina M.D.   On: 11/10/2016 10:33    Procedures Procedures (including critical care time)  Medications Ordered in ED Medications  aspirin chewable tablet 324 mg (324 mg Oral Given 11/10/16 1447)     Initial Impression / Assessment and Plan / ED Course  I have reviewed the triage vital signs and the nursing notes.  Pertinent labs & imaging results that were available during my care of the patient were reviewed by me and considered in my medical decision making (see chart for details).     81 yo F with a chief complaint of chest pain.  This is completely typical of ACS.  She has no shortness of breath no exertional symptoms.  The pain is reproducible with palpation of the chest wall.  Due the patient's age and risk factors I will obtain a delta troponin.  Initial troponins negative.  Her initial EKG had some changes that may be due to her antiarrhythmic medication.  Initial troponin was completely negative.  Second troponin however went from 0.00-0.07.  This change is somewhat concerning in combination with EKG changes and her risk based on the heart score.  I calculated her as a 4.  Will discuss with the hospitalist about serial troponins and possible stress testing.  Discussed with the hospitalist.  From there standpoint the patient has had two negative trops, and with the chronicity of symptoms feel she would be ok for d/c.  My concern is the elevation change since I am unable to get the patient admitted I will obtain a third troponin.  Third troponin is negative.  Discharge home.  9:08 AM  I have discussed the diagnosis/risks/treatment options with the patient and family and believe the pt to be eligible for discharge home to follow-up with Cards. We also discussed returning to the ED immediately if new or worsening sx occur. We discussed  the sx which are most concerning (e.g., sudden worsening pain, fever, inability to tolerate by mouth) that necessitate immediate return. Medications administered to the patient during their visit and any new prescriptions provided to the patient are listed below.  Medications given during this visit Medications  aspirin chewable tablet 324 mg (324 mg Oral Given 11/10/16 1447)     The patient appears reasonably screen and/or stabilized for discharge and I doubt any other medical condition or other [facility] requiring further screening, evaluation, or treatment in the ED at this time prior to discharge.    Final Clinical Impressions(s) / ED Diagnoses   Final diagnoses:  Atypical chest pain    ED Discharge Orders    None       Deno Etienne, DO 11/11/16 0908

## 2016-11-10 NOTE — Discharge Instructions (Signed)
Take tylenol 1000mg(2 extra strength) four times a day.  ° °

## 2016-12-05 DIAGNOSIS — F0390 Unspecified dementia without behavioral disturbance: Secondary | ICD-10-CM | POA: Diagnosis not present

## 2016-12-05 DIAGNOSIS — F341 Dysthymic disorder: Secondary | ICD-10-CM | POA: Diagnosis not present

## 2016-12-05 DIAGNOSIS — I4891 Unspecified atrial fibrillation: Secondary | ICD-10-CM | POA: Diagnosis not present

## 2016-12-05 DIAGNOSIS — I509 Heart failure, unspecified: Secondary | ICD-10-CM | POA: Diagnosis not present

## 2016-12-05 DIAGNOSIS — I701 Atherosclerosis of renal artery: Secondary | ICD-10-CM | POA: Diagnosis not present

## 2016-12-05 DIAGNOSIS — I10 Essential (primary) hypertension: Secondary | ICD-10-CM | POA: Diagnosis not present

## 2016-12-05 DIAGNOSIS — E119 Type 2 diabetes mellitus without complications: Secondary | ICD-10-CM | POA: Diagnosis not present

## 2016-12-24 DIAGNOSIS — Z7984 Long term (current) use of oral hypoglycemic drugs: Secondary | ICD-10-CM | POA: Diagnosis not present

## 2016-12-24 DIAGNOSIS — E119 Type 2 diabetes mellitus without complications: Secondary | ICD-10-CM | POA: Diagnosis not present

## 2016-12-24 DIAGNOSIS — F341 Dysthymic disorder: Secondary | ICD-10-CM | POA: Diagnosis not present

## 2016-12-24 DIAGNOSIS — F0390 Unspecified dementia without behavioral disturbance: Secondary | ICD-10-CM | POA: Diagnosis not present

## 2016-12-24 DIAGNOSIS — I4891 Unspecified atrial fibrillation: Secondary | ICD-10-CM | POA: Diagnosis not present

## 2016-12-24 DIAGNOSIS — I10 Essential (primary) hypertension: Secondary | ICD-10-CM | POA: Diagnosis not present

## 2016-12-24 DIAGNOSIS — I509 Heart failure, unspecified: Secondary | ICD-10-CM | POA: Diagnosis not present

## 2016-12-24 DIAGNOSIS — I701 Atherosclerosis of renal artery: Secondary | ICD-10-CM | POA: Diagnosis not present

## 2017-01-08 DIAGNOSIS — N644 Mastodynia: Secondary | ICD-10-CM | POA: Diagnosis not present

## 2017-01-17 ENCOUNTER — Other Ambulatory Visit: Payer: Self-pay | Admitting: Interventional Cardiology

## 2017-01-19 ENCOUNTER — Other Ambulatory Visit: Payer: Self-pay | Admitting: Interventional Cardiology

## 2017-01-22 DIAGNOSIS — F0390 Unspecified dementia without behavioral disturbance: Secondary | ICD-10-CM | POA: Diagnosis not present

## 2017-01-22 DIAGNOSIS — E119 Type 2 diabetes mellitus without complications: Secondary | ICD-10-CM | POA: Diagnosis not present

## 2017-01-22 DIAGNOSIS — I701 Atherosclerosis of renal artery: Secondary | ICD-10-CM | POA: Diagnosis not present

## 2017-01-22 DIAGNOSIS — I10 Essential (primary) hypertension: Secondary | ICD-10-CM | POA: Diagnosis not present

## 2017-01-22 DIAGNOSIS — F341 Dysthymic disorder: Secondary | ICD-10-CM | POA: Diagnosis not present

## 2017-01-22 DIAGNOSIS — I509 Heart failure, unspecified: Secondary | ICD-10-CM | POA: Diagnosis not present

## 2017-01-22 DIAGNOSIS — Z7984 Long term (current) use of oral hypoglycemic drugs: Secondary | ICD-10-CM | POA: Diagnosis not present

## 2017-01-22 DIAGNOSIS — I4891 Unspecified atrial fibrillation: Secondary | ICD-10-CM | POA: Diagnosis not present

## 2017-01-26 ENCOUNTER — Other Ambulatory Visit: Payer: Self-pay | Admitting: Interventional Cardiology

## 2017-01-30 ENCOUNTER — Other Ambulatory Visit: Payer: Self-pay | Admitting: Interventional Cardiology

## 2017-02-03 DIAGNOSIS — M940 Chondrocostal junction syndrome [Tietze]: Secondary | ICD-10-CM | POA: Diagnosis not present

## 2017-02-11 DIAGNOSIS — Z23 Encounter for immunization: Secondary | ICD-10-CM | POA: Diagnosis not present

## 2017-02-16 ENCOUNTER — Ambulatory Visit (INDEPENDENT_AMBULATORY_CARE_PROVIDER_SITE_OTHER): Payer: Medicare Other | Admitting: Nurse Practitioner

## 2017-02-16 ENCOUNTER — Encounter: Payer: Self-pay | Admitting: Nurse Practitioner

## 2017-02-16 VITALS — BP 170/80 | HR 59 | Ht 62.0 in | Wt 113.0 lb

## 2017-02-16 DIAGNOSIS — I739 Peripheral vascular disease, unspecified: Secondary | ICD-10-CM | POA: Diagnosis not present

## 2017-02-16 DIAGNOSIS — I4821 Permanent atrial fibrillation: Secondary | ICD-10-CM

## 2017-02-16 DIAGNOSIS — Z79899 Other long term (current) drug therapy: Secondary | ICD-10-CM | POA: Diagnosis not present

## 2017-02-16 DIAGNOSIS — I4819 Other persistent atrial fibrillation: Secondary | ICD-10-CM

## 2017-02-16 DIAGNOSIS — I482 Chronic atrial fibrillation: Secondary | ICD-10-CM | POA: Diagnosis not present

## 2017-02-16 DIAGNOSIS — I481 Persistent atrial fibrillation: Secondary | ICD-10-CM

## 2017-02-16 MED ORDER — CARVEDILOL 6.25 MG PO TABS: 6.2500 mg | ORAL_TABLET | Freq: Two times a day (BID) | ORAL | 3 refills | Status: DC

## 2017-02-16 MED ORDER — APIXABAN 5 MG PO TABS: 5.0000 mg | ORAL_TABLET | Freq: Two times a day (BID) | ORAL | 3 refills | Status: DC

## 2017-02-16 MED ORDER — MAGNESIUM OXIDE 400 MG PO TABS: 1.0000 | ORAL_TABLET | Freq: Every morning | ORAL | 3 refills | Status: DC

## 2017-02-16 MED ORDER — FUROSEMIDE 20 MG PO TABS: 20.0000 mg | ORAL_TABLET | Freq: Every day | ORAL | 3 refills | Status: DC

## 2017-02-16 NOTE — Progress Notes (Signed)
CARDIOLOGY OFFICE NOTE  Date:  02/16/2017    Donna [name] Date of Birth: 08/28/1935 Medical Record #[id number]  PCP:  Josetta Huddle, MD  Cardiologist:  Tamala Julian  Chief Complaint  Patient presents with  . Atrial Fibrillation  . Hypertension  . Congestive Heart Failure    Follow up visit - seen for Dr. Tamala Julian    History of Present Illness: Donna [name] is a 82 y.o. female who presents today for a follow up visit. Seen for Dr. Tamala Julian.   She has a history of atherosclerotic kidney disease, hypertension, paroxysmal atrial fibrillation, chronic diastolic heart failure when in A. fib, and chronic anticoagulation therapy. On chronic dofetilide and apixaban therapy.  Has not been seen since November of 2017. Was doing ok at that visit.   Comes in today. Here with her husband. They both say that she is doing well. Unclear why she has not been seen since 2017. Husband does her medicines and says he has been giving them as recommended. Does need some refills. She says she feels great. No chest pain. Not short of breath. Not dizzy or lightheaded. No palpitations. No recent labs that I can see. She has had some pain in the left breast - happens only at night and gets better with Voltaren gel. She has not kept her follow up with [facility].   Past Medical History:  Diagnosis Date  . Atherosclerosis of renal artery (HCC)    a. s/p R RA stenting;  b. 04/2014 Renal Art duplex: Patent R RA stent, <60% L RA stenosis. Followed by [facility].  . Carotid arterial disease ([place])    a. 01/2014 Carotid U/S: RICA 68%, LICA 34%. Followed by [facility].  . CKD (chronic kidney disease), stage III (HCC)    Stage II/III  . Essential hypertension   . Fibrocystic breast   . Fibromuscular dysplasia ([place])   . Hyperglycemia   . Mitral regurgitation    a. Echo 08/2014: mild MR.  . Paroxysmal atrial fibrillation ([place])    a. Dx 07/26/2014, CHA2DS2VASc = 5-->Eliquis;  c. 08/2014 Echo: EF 55-60%, no rwma, mildly dil LA/RA, mod-sev TR,  PASP 72mmHg. b. s/p DCCV 08/2014. c. back in atrial flutter 09/2014 -> rate control pursued.  . Paroxysmal atrial flutter ([place])    a. Dx 07/26/2014, CHA2DS2VASc = 5-->Eliquis;  c. 08/2014 Echo: EF 55-60%, no rwma, mildly dil LA/RA, mod-sev TR, PASP 79mmHg. b. s/p DCCV 08/2014. c. back in atrial flutter 09/2014 -> rate control pursued.  . Tricuspid regurgitation    a. Echo 08/2014: mod-severe.    Past Surgical History:  Procedure Laterality Date  . CARDIOVERSION N/A 09/01/2014   Procedure: CARDIOVERSION;  Surgeon: Josue Hector, MD;  Location: [facility] ENDOSCOPY;  Service: Cardiovascular;  Laterality: N/A;  . CARDIOVERSION N/A 11/23/2014   Procedure: CARDIOVERSION;  Surgeon: Skeet Latch, MD;  Location: [place];  Service: Cardiovascular;  Laterality: N/A;  . HEMORROIDECTOMY    . REDUCTION MAMMAPLASTY Bilateral   . RENAL ARTERY STENT  2008   Right renal artery by Dr. Drucie Opitz  . TONSILLECTOMY    . TUBAL LIGATION  1970's     Medications: Current Meds  Medication Sig  . acetaminophen (TYLENOL) 650 MG CR tablet Take 650 mg every 8 (eight) hours as needed by mouth for pain.  Marland Kitchen amLODipine (NORVASC) 5 MG tablet Take 1 tablet (5 mg total) by mouth daily. (Patient taking differently: Take 7.5 mg by mouth daily. )  . apixaban (ELIQUIS) 5 MG  TABS tablet Take 1 tablet (5 mg total) by mouth 2 (two) times daily.  . B Complex-C (SUPER B COMPLEX PO) Take 1 tablet by mouth daily.  . carvedilol (COREG) 6.25 MG tablet Take 1 tablet (6.25 mg total) by mouth 2 (two) times daily.  Marland Kitchen dofetilide (TIKOSYN) 125 MCG capsule TAKE THREE CAPSULES BY MOUTH TWICE DAILY  . furosemide (LASIX) 20 MG tablet Take 1 tablet (20 mg total) by mouth daily.  . magnesium oxide (MAG-OX) 400 MG tablet Take 1 tablet (400 mg total) by mouth every morning.  . Multiple Vitamin (MULTIVITAMIN WITH MINERALS) TABS tablet Take 1 tablet by mouth daily.  . nitroGLYCERIN (NITROSTAT) 0.4 MG SL tablet Place 1 tablet (0.4 mg total) under the  tongue every 5 (five) minutes as needed for chest pain.  . potassium chloride SA (KLOR-CON M20) 20 MEQ tablet Take 1 tablet (20 mEq total) by mouth daily.  . vitamin E 400 UNIT capsule Take 400 Units by mouth daily.  . [DISCONTINUED] apixaban (ELIQUIS) 5 MG TABS tablet Take 1 tablet (5 mg total) by mouth 2 (two) times daily.  . [DISCONTINUED] Calcium-Magnesium-Vitamin D (CALCIUM 1200+D3 PO) Take 1 tablet daily by mouth.  . [DISCONTINUED] carvedilol (COREG) 6.25 MG tablet Take 1 tablet (6.25 mg total) by mouth 2 (two) times daily. Please keep upcoming appt for future refills. Thank you  . [DISCONTINUED] furosemide (LASIX) 20 MG tablet Take 1 tablet (20 mg total) by mouth daily. Please keep upcoming appt for future refills. Thank you  . [DISCONTINUED] magnesium oxide (MAG-OX) 400 MG tablet Take 1 tablet (400 mg total) by mouth every morning. Please keep upcoming appt for future refills. Thank you     Allergies: Allergies  Allergen Reactions  . Tagamet [Cimetidine] Hives  . Diovan [Valsartan]     Hair loss    Social History: The patient  reports that  has never smoked. she has never used smokeless tobacco. She reports that she drinks alcohol. She reports that she does not use drugs.   Family History: The patient's family history includes Aneurysm in her father; Cancer (age of onset: 42) in her sister; Diabetes in her father and mother; Heart attack in her father; Hypertension in her father and mother; Stroke (age of onset: 89) in her mother.   Review of Systems: Please see the history of present illness.   Otherwise, the review of systems is positive for none.   All other systems are reviewed and negative.   Physical Exam: VS:  BP (!) 170/80 (BP Location: Left Arm, Patient Position: Sitting, Cuff Size: Normal)   Pulse (!) 59   Ht 5\' 2"  (1.575 m)   Wt 113 lb (51.3 kg)   BMI 20.67 kg/m  .  BMI Body mass index is 20.67 kg/m.  Wt Readings from Last 3 Encounters:  02/16/17 113 lb (51.3  kg)  11/10/16 125 lb (56.7 kg)  12/26/15 100 lb (45.4 kg)   Repeat BP by me is down to 150/70  General: Pleasant. Remains quite thin. She is alert and in no acute distress.   HEENT: Normal.  Neck: Supple, no JVD, carotid bruits, or masses noted.  Cardiac: Regular rate and rhythm. No murmurs, rubs, or gallops. No edema.  Respiratory:  Lungs are clear to auscultation bilaterally with normal work of breathing.  GI: Soft and nontender.  MS: No deformity or atrophy. Gait and ROM intact.  Skin: Warm and dry. Color is normal.  Neuro:  Strength and sensation are intact and no  gross focal deficits noted.  Psych: Alert, appropriate and with normal affect.   LABORATORY DATA:  EKG:  EKG is ordered today. This demonstrates persistent NSR with 1st degree AV block with PACs.  Lab Results  Component Value Date   WBC 4.0 11/10/2016   HGB 14.0 11/10/2016   HCT 41.2 11/10/2016   PLT 159 11/10/2016   GLUCOSE 135 (H) 11/10/2016   ALT 25 08/29/2015   AST 27 08/29/2015   NA 139 11/10/2016   K 4.0 11/10/2016   CL 107 11/10/2016   CREATININE 0.89 11/10/2016   BUN 15 11/10/2016   CO2 25 11/10/2016   TSH 0.443 11/19/2014   INR 1.54 (H) 11/23/2014   HGBA1C 5.8 (H) 08/31/2015     BNP (last 3 results) No results for input(s): BNP in the last 8760 hours.  ProBNP (last 3 results) No results for input(s): PROBNP in the last 8760 hours.   Other Studies Reviewed Today:   Assessment/Plan:  1. PAF - on Tikosyn - needs labs today. I have refilled her medicines today.   2. High risk medicine  3. HTN - recheck by me is improved.   4. PAD - previously followed by [facility] - she has not kept that follow up - will discuss again on return and see if either I can order her testing or get her to go back. Will see what her labs look like. She currently states that she feels she does not need anything done.   5. Chronic diastolic HF - looks compensated. No symptoms.   6. Atypical chest wall pain - has  palpable chest pain on exam - she is using Voltaren gel with relief.   I suspect there is some degree of medication non compliance - her bottle of Eliquis dates back to 2017. Unclear to me how she has gotten her Tikosyn. Husband is a little vague on these issues.    Current medicines are reviewed with the patient today.  The patient does not have concerns regarding medicines other than what has been noted above.  The following changes have been made:  See above.  Labs/ tests ordered today include:    Orders Placed This Encounter  Procedures  . Basic metabolic panel  . CBC  . Hepatic function panel  . Lipid panel  . Magnesium  . EKG 12-Lead     Disposition:   FU with me in 6 months.   Patient is agreeable to this plan and will call if any problems develop in the interim.   SignedTruitt Merle, NP  02/16/2017 4:28 PM  [facility] [address] [place], [place]  [address] Phone: [phone] Fax: [phone]

## 2017-02-16 NOTE — Patient Instructions (Addendum)
We will be checking the following labs today - BMET, CBC, HPF, Lipids and Mg level   Medication Instructions:    Continue with your current medicines.   I have sent in your refills today    Testing/Procedures To Be Arranged:  N/A  Follow-Up:   See me in 6 months with EKG    Other Special Instructions:   N/A    If you need a refill on your cardiac medications before your next appointment, please call your pharmacy.   Call the [place] office at [phone] if you have any questions, problems or concerns.

## 2017-02-17 ENCOUNTER — Other Ambulatory Visit: Payer: Self-pay | Admitting: *Deleted

## 2017-02-17 DIAGNOSIS — I6523 Occlusion and stenosis of bilateral carotid arteries: Secondary | ICD-10-CM

## 2017-02-17 DIAGNOSIS — I701 Atherosclerosis of renal artery: Secondary | ICD-10-CM

## 2017-02-17 LAB — LIPID PANEL
Chol/HDL Ratio: 3.1 ratio (ref 0.0–4.4)
Cholesterol, Total: 179 mg/dL (ref 100–199)
HDL: 57 mg/dL (ref >39)
LDL Calculated: 101 mg/dL — ABNORMAL HIGH (ref 0–99)
Triglycerides: 103 mg/dL (ref 0–149)
VLDL Cholesterol Cal: 21 mg/dL (ref 5–40)

## 2017-02-17 LAB — CBC
Hematocrit: 39 % (ref 34.0–46.6)
Hemoglobin: 13.3 g/dL (ref 11.1–15.9)
MCH: 32.2 pg (ref 26.6–33.0)
MCHC: 34.1 g/dL (ref 31.5–35.7)
MCV: 94 fL (ref 79–97)
Platelets: 211 10*3/uL (ref 150–379)
RBC: 4.13 x10E6/uL (ref 3.77–5.28)
RDW: 14.7 % (ref 12.3–15.4)
WBC: 5.4 10*3/uL (ref 3.4–10.8)

## 2017-02-17 LAB — BASIC METABOLIC PANEL
BUN/Creatinine Ratio: 35 — ABNORMAL HIGH (ref 12–28)
BUN: 34 mg/dL — ABNORMAL HIGH (ref 8–27)
CO2: 17 mmol/L — ABNORMAL LOW (ref 20–29)
Calcium: 10 mg/dL (ref 8.7–10.3)
Chloride: 107 mmol/L — ABNORMAL HIGH (ref 96–106)
Creatinine, Ser: 0.97 mg/dL (ref 0.57–1.00)
GFR calc Af Amer: 64 mL/min/{1.73_m2} (ref >59)
GFR calc non Af Amer: 56 mL/min/{1.73_m2} — ABNORMAL LOW (ref >59)
Glucose: 218 mg/dL — ABNORMAL HIGH (ref 65–99)
Potassium: 4.3 mmol/L (ref 3.5–5.2)
Sodium: 144 mmol/L (ref 134–144)

## 2017-02-17 LAB — HEPATIC FUNCTION PANEL
ALT: 26 IU/L (ref 0–32)
AST: 20 IU/L (ref 0–40)
Albumin: 4.4 g/dL (ref 3.5–4.8)
Alkaline Phosphatase: 52 IU/L (ref 39–117)
Bilirubin Total: 0.3 mg/dL (ref 0.0–1.2)
Bilirubin, Direct: 0.11 mg/dL (ref 0.00–0.40)
Total Protein: 7.3 g/dL (ref 6.0–8.5)

## 2017-02-17 LAB — MAGNESIUM: Magnesium: 2 mg/dL (ref 1.6–2.3)

## 2017-02-19 DIAGNOSIS — Z7984 Long term (current) use of oral hypoglycemic drugs: Secondary | ICD-10-CM | POA: Diagnosis not present

## 2017-02-19 DIAGNOSIS — R7309 Other abnormal glucose: Secondary | ICD-10-CM | POA: Diagnosis not present

## 2017-02-19 DIAGNOSIS — E119 Type 2 diabetes mellitus without complications: Secondary | ICD-10-CM | POA: Diagnosis not present

## 2017-02-24 ENCOUNTER — Ambulatory Visit (HOSPITAL_COMMUNITY)
Admission: RE | Admit: 2017-02-24 | Discharge: 2017-02-24 | Disposition: A | Discharge status: Home / Self Care | Payer: Medicare Other | Source: Ambulatory Visit | Attending: Cardiology | Admitting: Cardiology

## 2017-02-24 DIAGNOSIS — I6523 Occlusion and stenosis of bilateral carotid arteries: Secondary | ICD-10-CM | POA: Diagnosis not present

## 2017-03-03 ENCOUNTER — Ambulatory Visit (HOSPITAL_COMMUNITY)
Admission: RE | Admit: 2017-03-03 | Discharge: 2017-03-03 | Disposition: A | Discharge status: Home / Self Care | Payer: Medicare Other | Source: Ambulatory Visit | Attending: Cardiology | Admitting: Cardiology

## 2017-03-03 DIAGNOSIS — I701 Atherosclerosis of renal artery: Secondary | ICD-10-CM | POA: Diagnosis not present

## 2017-03-11 ENCOUNTER — Ambulatory Visit: Payer: Medicare Other | Admitting: Interventional Cardiology

## 2017-03-18 ENCOUNTER — Emergency Department (HOSPITAL_COMMUNITY): Payer: Medicare Other

## 2017-03-18 ENCOUNTER — Other Ambulatory Visit: Payer: Self-pay

## 2017-03-18 ENCOUNTER — Encounter (HOSPITAL_COMMUNITY): Payer: Self-pay | Admitting: *Deleted

## 2017-03-18 ENCOUNTER — Emergency Department (HOSPITAL_COMMUNITY)
Admission: EM | Admit: 2017-03-18 | Discharge: 2017-03-18 | Discharge status: Against Medical Advice | Payer: Medicare Other | Attending: Emergency Medicine | Admitting: Emergency Medicine

## 2017-03-18 DIAGNOSIS — R51 Headache: Secondary | ICD-10-CM | POA: Insufficient documentation

## 2017-03-18 DIAGNOSIS — R079 Chest pain, unspecified: Secondary | ICD-10-CM | POA: Diagnosis not present

## 2017-03-18 DIAGNOSIS — R6889 Other general symptoms and signs: Secondary | ICD-10-CM

## 2017-03-18 DIAGNOSIS — R109 Unspecified abdominal pain: Secondary | ICD-10-CM | POA: Diagnosis not present

## 2017-03-18 LAB — CBC WITH DIFFERENTIAL/PLATELET
Basophils Absolute: 0 10*3/uL (ref 0.0–0.1)
Basophils Relative: 1 %
Eosinophils Absolute: 0.2 10*3/uL (ref 0.0–0.7)
Eosinophils Relative: 4 %
HCT: 42.3 % (ref 36.0–46.0)
Hemoglobin: 14.3 g/dL (ref 12.0–15.0)
Lymphocytes Relative: 20 %
Lymphs Abs: 1.3 10*3/uL (ref 0.7–4.0)
MCH: 32.9 pg (ref 26.0–34.0)
MCHC: 33.8 g/dL (ref 30.0–36.0)
MCV: 97.2 fL (ref 78.0–100.0)
Monocytes Absolute: 0.5 10*3/uL (ref 0.1–1.0)
Monocytes Relative: 7 %
Neutro Abs: 4.4 10*3/uL (ref 1.7–7.7)
Neutrophils Relative %: 68 %
Platelets: 237 10*3/uL (ref 150–400)
RBC: 4.35 MIL/uL (ref 3.87–5.11)
RDW: 13.3 % (ref 11.5–15.5)
WBC: 6.5 10*3/uL (ref 4.0–10.5)

## 2017-03-18 LAB — LIPASE, BLOOD: Lipase: 25 U/L (ref 11–51)

## 2017-03-18 LAB — COMPREHENSIVE METABOLIC PANEL
ALT: 13 U/L — ABNORMAL LOW (ref 14–54)
AST: 21 U/L (ref 15–41)
Albumin: 4.2 g/dL (ref 3.5–5.0)
Alkaline Phosphatase: 44 U/L (ref 38–126)
Anion gap: 8 (ref 5–15)
BUN: 27 mg/dL — ABNORMAL HIGH (ref 6–20)
CO2: 27 mmol/L (ref 22–32)
Calcium: 10.2 mg/dL (ref 8.9–10.3)
Chloride: 106 mmol/L (ref 101–111)
Creatinine, Ser: 1 mg/dL (ref 0.44–1.00)
GFR calc Af Amer: >60 mL/min (ref >60)
GFR calc non Af Amer: 52 mL/min — ABNORMAL LOW (ref >60)
Glucose, Bld: 96 mg/dL (ref 65–99)
Potassium: 4.4 mmol/L (ref 3.5–5.1)
Sodium: 141 mmol/L (ref 135–145)
Total Bilirubin: 0.7 mg/dL (ref 0.3–1.2)
Total Protein: 7.7 g/dL (ref 6.5–8.1)

## 2017-03-18 LAB — I-STAT TROPONIN, ED: Troponin i, poc: 0.01 ng/mL (ref 0.00–0.08)

## 2017-03-18 NOTE — ED Notes (Addendum)
Called pt for recheck vitals no answer

## 2017-03-18 NOTE — ED Provider Notes (Signed)
Patient placed in Quick Look pathway, seen and evaluated   Chief Complaint: "Not feeling well "  HPI:   Patient presents today with complaint of "not feeling well ".  She states that when she awoke this morning she experienced some mild pain in her abdomen as well as substernal chest pain.  She also notes acute onset of frontal headache.  States she does not have a history of prior headaches.  No vision changes, nausea, vomiting, shortness of breath, numbness, tingling, weakness, or syncope.  No recent trauma or falls.  Her abdominal pain, headaches, and chest pain have all resolved.  Positive for headaches, chest pain,  ROS: Abdominal pain.  Negative for shortness of breath, fevers, syncope, numbness, nausea, vomiting  Physical Exam:   Gen: No distress  Neuro: Awake and Alert  Skin: Warm    Focused Exam: Abdomen is soft and nontender.  RRR, breath sounds clear to auscultation bilaterally.  No increased work of breathing.  Cranial nerves II through XII tested and intact.  She exhibits fluent speech with no evidence of dysarthria or aphasia.  No facial droop.  5/5 strength of BUE and BLE major muscle groups.  No pronator drift.  Normal gait and balance, able to Heel Walk and Toe Walk without difficulty.  Initiation of care has begun. The patient has been counseled on the process, plan, and necessity for staying for the completion/evaluation, and the remainder of the medical screening examination    Debroah Baller 03/18/17 1425    Isla Pence, MD 03/18/17 1630

## 2017-03-18 NOTE — ED Notes (Signed)
Last call in lobby for vitals. No response.

## 2017-03-18 NOTE — ED Triage Notes (Signed)
Pt reports waking up this am and "just not feeling well." has headache which she states is abnormal for her and generalized fatigue.

## 2017-03-18 NOTE — ED Notes (Signed)
Called pt for recheck vitals no answer

## 2017-03-19 ENCOUNTER — Emergency Department (HOSPITAL_COMMUNITY)
Admission: EM | Admit: 2017-03-19 | Discharge: 2017-03-19 | Disposition: A | Discharge status: Home / Self Care | Payer: Medicare Other | Attending: Emergency Medicine | Admitting: Emergency Medicine

## 2017-03-19 ENCOUNTER — Other Ambulatory Visit: Payer: Self-pay

## 2017-03-19 ENCOUNTER — Emergency Department (HOSPITAL_COMMUNITY): Payer: Medicare Other

## 2017-03-19 ENCOUNTER — Encounter (HOSPITAL_COMMUNITY): Payer: Self-pay

## 2017-03-19 DIAGNOSIS — I5032 Chronic diastolic (congestive) heart failure: Secondary | ICD-10-CM | POA: Diagnosis not present

## 2017-03-19 DIAGNOSIS — R0789 Other chest pain: Secondary | ICD-10-CM | POA: Insufficient documentation

## 2017-03-19 DIAGNOSIS — R079 Chest pain, unspecified: Secondary | ICD-10-CM | POA: Diagnosis not present

## 2017-03-19 DIAGNOSIS — Z79899 Other long term (current) drug therapy: Secondary | ICD-10-CM | POA: Insufficient documentation

## 2017-03-19 DIAGNOSIS — Z7901 Long term (current) use of anticoagulants: Secondary | ICD-10-CM | POA: Diagnosis not present

## 2017-03-19 DIAGNOSIS — N183 Chronic kidney disease, stage 3 (moderate): Secondary | ICD-10-CM | POA: Insufficient documentation

## 2017-03-19 DIAGNOSIS — I130 Hypertensive heart and chronic kidney disease with heart failure and stage 1 through stage 4 chronic kidney disease, or unspecified chronic kidney disease: Secondary | ICD-10-CM | POA: Insufficient documentation

## 2017-03-19 LAB — I-STAT TROPONIN, ED: Troponin i, poc: 0 ng/mL (ref 0.00–0.08)

## 2017-03-19 MED ORDER — ACETAMINOPHEN 325 MG PO TABS
650.0000 mg | ORAL_TABLET | Freq: Once | ORAL | Status: AC
Start: 2017-03-19 — End: 2017-03-19
  Administered 2017-03-19: 650 mg via ORAL
  Filled 2017-03-19: qty 2

## 2017-03-19 NOTE — ED Triage Notes (Signed)
Patient complains of recurrent CP this am. Seen in ED yesterday for same and had full work-up. On arrival reports no pain and feels normal.

## 2017-03-19 NOTE — ED Provider Notes (Signed)
[facility] EMERGENCY DEPARTMENT Provider Note   CSN: [id number] Arrival date & time: 03/19/17  1015     History   Chief Complaint Chief Complaint  Patient presents with  . recheck CP    HPI Donna [name] is a 82 y.o. female   This is a 82 year old female with a history of atherosclerotic renal disease, carotid arterial disease, CKD, hypertension, paroxysmal A. Fib (on Eliquis) followed by Dr. Tamala Julian of Cardiology presenting with chest pain yesterday.  She was seen in quick look pathway but left before being evaluated.  She reports to me that she woke yesterday with a dull, achy substernal chest pain without radiation when she awoke yesterday.  This is not associated with shortness of breath, nausea, vomiting or diaphoresis.  She notes that the pain lasted for approximately 5 minutes before dissipating.  It has not recurred.  The pain was non-positional, nonpleuritic and non-exertional in nature.  She not take any medications for her symptoms.  Nothing made her symptoms better or worse.  She denies history of the same.  She denies history of MI/CVA.  She reported to condition yesterday that she had a headache but denies this to myself.  She denies any associated visual changes, hearing changes, facial droop, difficulty with speech, numbness/tingling/weakness of the extremities or syncope, recent trauma or falls.  Her echocardiogram on 08/09/2014 shows EF of 55-60% with no regional wall motion abnormalities.  HPI  Past Medical History:  Diagnosis Date  . Atherosclerosis of renal artery (HCC)    a. s/p R RA stenting;  b. 04/2014 Renal Art duplex: Patent R RA stent, <60% L RA stenosis. Followed by [facility].  . Carotid arterial disease ([place])    a. 01/2014 Carotid U/S: RICA 02%, LICA 58%. Followed by [facility].  . CKD (chronic kidney disease), stage III (HCC)    Stage II/III  . Essential hypertension   . Fibrocystic breast   . Fibromuscular dysplasia ([place])   . Hyperglycemia   .  Mitral regurgitation    a. Echo 08/2014: mild MR.  . Paroxysmal atrial fibrillation ([place])    a. Dx 07/26/2014, CHA2DS2VASc = 5-->Eliquis;  c. 08/2014 Echo: EF 55-60%, no rwma, mildly dil LA/RA, mod-sev TR, PASP 15mmHg. b. s/p DCCV 08/2014. c. back in atrial flutter 09/2014 -> rate control pursued.  . Paroxysmal atrial flutter ([place])    a. Dx 07/26/2014, CHA2DS2VASc = 5-->Eliquis;  c. 08/2014 Echo: EF 55-60%, no rwma, mildly dil LA/RA, mod-sev TR, PASP 13mmHg. b. s/p DCCV 08/2014. c. back in atrial flutter 09/2014 -> rate control pursued.  . Tricuspid regurgitation    a. Echo 08/2014: mod-severe.    Patient Active Problem List   Diagnosis Date Noted  . Chronic anticoagulation 02/22/2015  . Chronic atrial fibrillation ([place]) 11/19/2014  . Chronic diastolic HF (heart failure) ([place]) 11/19/2014  . CKD (chronic kidney disease), stage III ([place]) 11/19/2014  . Fibromuscular dysplasia ([place])   . Carotid arterial disease ([place])   . Hypertension 05/28/2011  . Menopause 05/28/2011  . Atherosclerotic RAS (renal artery stenosis), bilateral ([place]) 06/28/2010    Past Surgical History:  Procedure Laterality Date  . CARDIOVERSION N/A 09/01/2014   Procedure: CARDIOVERSION;  Surgeon: Josue Hector, MD;  Location: [facility] ENDOSCOPY;  Service: Cardiovascular;  Laterality: N/A;  . CARDIOVERSION N/A 11/23/2014   Procedure: CARDIOVERSION;  Surgeon: Skeet Latch, MD;  Location: [facility];  Service: Cardiovascular;  Laterality: N/A;  . HEMORROIDECTOMY    . REDUCTION MAMMAPLASTY Bilateral   .  RENAL ARTERY STENT  2008   Right renal artery by Dr. Drucie Opitz  . TONSILLECTOMY    . TUBAL LIGATION  1970's    OB History    Gravida Para Term Preterm AB Living   4 4 4  0 0 4   SAB TAB Ectopic Multiple Live Births   0 0 0 0         Home Medications    Prior to Admission medications   Medication Sig Start Date End Date Taking? Authorizing Provider  acetaminophen (TYLENOL) 650 MG CR tablet Take 650 mg every 8 (eight)  hours as needed by mouth for pain.    [provider]  amLODipine (NORVASC) 5 MG tablet Take 1 tablet (5 mg total) by mouth daily. Patient taking differently: Take 7.5 mg by mouth daily.  12/03/15 02/16/17  Belva Crome, MD  apixaban (ELIQUIS) 5 MG TABS tablet Take 1 tablet (5 mg total) by mouth 2 (two) times daily. 02/16/17   Burtis Junes, NP  B Complex-C (SUPER B COMPLEX PO) Take 1 tablet by mouth daily.    [provider]  carvedilol (COREG) 6.25 MG tablet Take 1 tablet (6.25 mg total) by mouth 2 (two) times daily. 02/16/17   Burtis Junes, NP  dofetilide (TIKOSYN) 125 MCG capsule TAKE THREE CAPSULES BY MOUTH TWICE DAILY 08/12/16   Belva Crome, MD  furosemide (LASIX) 20 MG tablet Take 1 tablet (20 mg total) by mouth daily. 02/16/17   Burtis Junes, NP  magnesium oxide (MAG-OX) 400 MG tablet Take 1 tablet (400 mg total) by mouth every morning. 02/16/17   Burtis Junes, NP  Multiple Vitamin (MULTIVITAMIN WITH MINERALS) TABS tablet Take 1 tablet by mouth daily.    [provider]  nitroGLYCERIN (NITROSTAT) 0.4 MG SL tablet Place 1 tablet (0.4 mg total) under the tongue every 5 (five) minutes as needed for chest pain. 11/14/14   Dunn, Nedra Hai, PA-C  potassium chloride SA (KLOR-CON M20) 20 MEQ tablet Take 1 tablet (20 mEq total) by mouth daily. 09/28/14   Dunn, Nedra Hai, PA-C  vitamin E 400 UNIT capsule Take 400 Units by mouth daily.    [provider]    Family History Family History  Problem Relation Age of Onset  . Stroke Mother 50  . Diabetes Mother   . Hypertension Mother   . Aneurysm Father        abdominal aortic aneurysm  . Diabetes Father   . Hypertension Father   . Heart attack Father   . Cancer Sister 58       breast cancer  . Breast cancer Neg Hx     Social History Social History   Tobacco Use  . Smoking status: Never Smoker  . Smokeless tobacco: Never Used  Substance Use Topics  . Alcohol use: Yes  . Drug use: No      Allergies   Tagamet [cimetidine] and Diovan [valsartan]   Review of Systems Review of Systems  All other systems reviewed and are negative.    Physical Exam Updated Vital Signs BP (!) 194/78 (BP Location: Right Arm)   Pulse (!) 55   Temp 97.7 F (36.5 C) (Oral)   Resp 16   SpO2 100%   Physical Exam  Constitutional: She appears well-developed and well-nourished.  HENT:  Head: Normocephalic and atraumatic.  Right Ear: External ear normal.  Left Ear: External ear normal.  Nose: Nose normal.  Mouth/Throat: Uvula is midline, oropharynx is  clear and moist and mucous membranes are normal. No tonsillar exudate.  Eyes: Pupils are equal, round, and reactive to light. Right eye exhibits no discharge. Left eye exhibits no discharge. No scleral icterus.  Neck: Trachea normal. Neck supple. No spinous process tenderness present. No neck rigidity. Normal range of motion present.  Cardiovascular: Normal rate, regular rhythm and intact distal pulses.  No murmur heard. Pulses:      Radial pulses are 2+ on the right side, and 2+ on the left side.       Dorsalis pedis pulses are 2+ on the right side, and 2+ on the left side.       Posterior tibial pulses are 2+ on the right side, and 2+ on the left side.  No lower extremity swelling or edema. Calves symmetric in size bilaterally.  Pulmonary/Chest: Effort normal and breath sounds normal. She exhibits no tenderness.  Abdominal: Soft. Bowel sounds are normal. There is no tenderness. There is no rebound and no guarding.  Musculoskeletal: She exhibits no edema.  Lymphadenopathy:    She has no cervical adenopathy.  Neurological: She is alert.  Mental Status:  Alert, oriented, thought content appropriate, able to give a coherent history. Speech fluent without evidence of aphasia. Able to follow 2 step commands without difficulty.  Cranial Nerves:  II:  Peripheral visual fields grossly normal, pupils equal, round, reactive to light III,IV,  VI: ptosis not present, extra-ocular motions intact bilaterally  V,VII: smile symmetric, eyebrows raise symmetric, facial light touch sensation equal VIII: hearing grossly normal to voice  X: uvula elevates symmetrically  XI: bilateral shoulder shrug symmetric and strong XII: midline tongue extension without fassiculations Motor:  Normal tone. 5/5 in upper and lower extremities bilaterally including strong and equal grip strength and dorsiflexion/plantar flexion Sensory: Sensation intact to light touch in all extremities. Negative Romberg.  Deep Tendon Reflexes: 2+ and symmetric in the biceps and patella Cerebellar: normal finger-to-nose with bilateral upper extremities. Normal heel-to -shin balance bilaterally of the lower extremity. No pronator drift.  Gait: normal gait and balance CV: distal pulses palpable throughout   Skin: Skin is warm and dry. No rash noted. She is not diaphoretic.  Psychiatric: She has a normal mood and affect.  Nursing note and vitals reviewed.    ED Treatments / Results  Labs (all labs ordered are listed, but only abnormal results are displayed) Labs Reviewed  I-STAT TROPONIN, ED    EKG  EKG Interpretation None       Radiology Dg Chest 2 View  Result Date: 03/19/2017 CLINICAL DATA:  Chest pain EXAM: CHEST - 2 VIEW COMPARISON:  11/10/2016 FINDINGS: Lungs are hyperexpanded. The lungs are clear without focal pneumonia, edema, pneumothorax or pleural effusion. Cardiopericardial silhouette is at upper limits of normal for size. The visualized bony structures of the thorax are intact. IMPRESSION: Hyperexpansion without acute cardiopulmonary findings. Electronically Signed   By: Misty Stanley M.D.   On: 03/19/2017 20:07   Ct Head Wo Contrast  Result Date: 03/18/2017 CLINICAL DATA:  Woke up, not feeling well. Headache. Generalized fatigue. EXAM: CT HEAD WITHOUT CONTRAST TECHNIQUE: Contiguous axial images were obtained from the base of the skull through the  vertex without intravenous contrast. COMPARISON:  09/07/2014 MRI.  03/03/2012 CT FINDINGS: Brain: No evidence for acute infarction, hemorrhage, mass lesion, hydrocephalus, or extra-axial fluid. Moderate atrophy. Hypoattenuation of white matter likely small vessel disease. Vascular: Calcification of the cavernous internal carotid arteries consistent with cerebrovascular atherosclerotic disease. No signs of intracranial large vessel  occlusion. Skull: Normal. Negative for fracture or focal lesion. Sinuses/Orbits: No acute finding. Other: Compared with priors, when technique differences are considered, similar appearance IMPRESSION: Chronic changes as described.  No acute intracranial abnormality. Electronically Signed   By: Staci Righter M.D.   On: 03/18/2017 17:03    Procedures Procedures (including critical care time)  Medications Ordered in ED Medications  acetaminophen (TYLENOL) tablet 650 mg (650 mg Oral Given 03/19/17 2015)     Initial Impression / Assessment and Plan / ED Course  I have reviewed the triage vital signs and the nursing notes.  Pertinent labs & imaging results that were available during my care of the patient were reviewed by me and considered in my medical decision making (see chart for details).     This is a 82 year old female presenting with 5-minute episode of non-positional, nonpleuritic, nonexertional chest pain when awakening yesterday that she describes as dull and achy in her substernal chest.  There is no radiation of the pain.  It was not associated with nausea, vomiting, shortness of breath or diaphoresis.  She denied any associated abdominal pain with this.  She is currently chest pain-free.  Her chest pain has not reoccured.  She was seen in the quick look pathway yesterday but left before being formally evaluated.  At that time patient had reassuring EKG, troponin, basic lab work and CT head.  CT head was ordered because patient was complaining of headache.  She  denies this to myself.  No neurologic symptoms reported.  She is neurologically intact.  Do not feel this needs any further workup.  EKG today without ischemic changes.  Troponin 0.00.  Chest x-ray without any cardiopulmonary disease.  I would low suspicion for ACS or life threatening pulmonary or cardiac etiology at this time.  Patient is followed by cardiology, Dr. Tamala Julian.  Will recommend that the patient call his office and make them aware she was seen here today.  The patient can be followed on outpatient basis for this.  She is to call her memory care doctor, Dr. Inda Merlin in regards to today's visit.  She is noted to have a mildly elevated blood pressure but is also missed her home blood pressure medications due to long stay in the department today.  Specific return precautions discussed. Time was given for all questions to be answered. The patient verbalized understanding and agreement with plan. The patient appears safe for discharge home.  Patient case seen and discussed with Dr. Rogene Houston who is in agreement with plan.   Final Clinical Impressions(s) / ED Diagnoses   Final diagnoses:  Atypical chest pain    ED Discharge Orders    None       Lorelle Gibbs 03/19/17 2043    Fredia Sorrow, MD 03/21/17 [id number]

## 2017-03-19 NOTE — ED Notes (Signed)
Pt ambulated to restroom. Gait steady.  

## 2017-03-19 NOTE — ED Provider Notes (Signed)
Medical screening examination/treatment/procedure(s) were conducted as a shared visit with non-physician practitioner(s) and myself.  I personally evaluated the patient during the encounter.   EKG Interpretation None       Results for orders placed or performed during the hospital encounter of 03/19/17  I-stat troponin, ED  Result Value Ref Range   Troponin i, poc 0.00 0.00 - 0.08 ng/mL   Comment 3           Dg Chest 2 View  Result Date: 03/19/2017 CLINICAL DATA:  Chest pain EXAM: CHEST - 2 VIEW COMPARISON:  11/10/2016 FINDINGS: Lungs are hyperexpanded. The lungs are clear without focal pneumonia, edema, pneumothorax or pleural effusion. Cardiopericardial silhouette is at upper limits of normal for size. The visualized bony structures of the thorax are intact. IMPRESSION: Hyperexpansion without acute cardiopulmonary findings. Electronically Signed   By: Misty Stanley M.D.   On: 03/19/2017 20:07   Ct Head Wo Contrast  Result Date: 03/18/2017 CLINICAL DATA:  Woke up, not feeling well. Headache. Generalized fatigue. EXAM: CT HEAD WITHOUT CONTRAST TECHNIQUE: Contiguous axial images were obtained from the base of the skull through the vertex without intravenous contrast. COMPARISON:  09/07/2014 MRI.  03/03/2012 CT FINDINGS: Brain: No evidence for acute infarction, hemorrhage, mass lesion, hydrocephalus, or extra-axial fluid. Moderate atrophy. Hypoattenuation of white matter likely small vessel disease. Vascular: Calcification of the cavernous internal carotid arteries consistent with cerebrovascular atherosclerotic disease. No signs of intracranial large vessel occlusion. Skull: Normal. Negative for fracture or focal lesion. Sinuses/Orbits: No acute finding. Other: Compared with priors, when technique differences are considered, similar appearance IMPRESSION: Chronic changes as described.  No acute intracranial abnormality. Electronically Signed   By: Staci Righter M.D.   On: 03/18/2017 17:03     Patient had chest pain yesterday for about 2 hours.  None today.  Patient was screened yesterday but due to the way x1 home.  Came back today for further evaluation.  Patient without any recurrent chest pain.  Patient nontoxic no acute distress here today.  EKG without acute changes compared to yesterday.  Patient does have primary care doctor and Dr. Inda Merlin and is also followed by Pernell Dupre with cardiology.  Patient's blood pressures here today are high patient feels is due to the fact she had had a blood pressure medicine today she had an extremely long wait.  Stable for patient to go home with the troponins being negative here today.  She will follow-up with her doctors for recheck of her blood pressure and will follow-up with cardiology.  Patient stated nontoxic no acute distress lungs are clear heart without any significant arrhythmia.  Abdomen soft.   Fredia Sorrow, MD 03/19/17 2045

## 2017-03-19 NOTE — Discharge Instructions (Signed)
Read instructions below for reasons to return to the Emergency Department. It is recommended that your follow up with your Primary Care Doctor in regards to today's visit. If you do not have a doctor, use the resource guide listed below to help you find one.   Tests performed today & yesterday include: An EKG of your heart A chest x-ray CT of your head Cardiac enzymes - a blood test for heart muscle damage Blood counts and electrolytes Vital signs. See below for your results today.   Chest Pain (Nonspecific)  HOME CARE INSTRUCTIONS  For the next few days, avoid physical activities that bring on chest pain. Continue physical activities as directed.  Do not smoke cigarettes or drink alcohol until your symptoms are gone. If you do smoke, it is time to quit. You may receive instructions and counseling on how to stop smoking. Only take over-the-counter or prescription medicine for pain, discomfort, or fever as directed by your caregiver.  Follow your caregiver's suggestions for further testing if your chest pain does not go away.  Keep any follow-up appointments you made. If you do not go to an appointment, you could develop lasting (chronic) problems with pain. If there is any problem keeping an appointment, you must call to reschedule.  SEEK MEDICAL CARE IF:  You think you are having problems from the medicine you are taking. Read your medicine instructions carefully.  Your chest pain does not go away, even after treatment.  You develop a rash with blisters on your chest.  SEEK IMMEDIATE MEDICAL CARE IF:  You have increased chest pain or pain that spreads to your arm, neck, jaw, back, or belly (abdomen).  You develop shortness of breath, an increasing cough, or you are coughing up blood.  You have severe back or abdominal pain, feel sick to your stomach (nauseous) or throw up (vomit).  You develop severe weakness, fainting, or chills.  You have an oral temperature above 102 F (38.9 C), not  controlled by medicine.  THIS IS AN EMERGENCY. Do not wait to see if the pain will go away. Get medical help at once. Call your local emergency services (911 in [place] Do not drive yourself to the hospital. Additional Information:  Your vital signs today were: BP (!) 194/78 (BP Location: Right Arm)    Pulse (!) 55    Temp 97.7 F (36.5 C) (Oral)    Resp 16    SpO2 100%  If your blood pressure (BP) was elevated above 135/85 this visit, please have this repeated by your doctor within one month. ---------------

## 2017-03-19 NOTE — ED Notes (Signed)
Pt verbalized understanding of discharge instructions.

## 2017-03-19 NOTE — ED Notes (Signed)
Pt back from x-ray.

## 2017-03-19 NOTE — ED Notes (Signed)
Delay in bringing pt to hallway bed due to no available beds in the department./

## 2017-03-19 NOTE — ED Notes (Signed)
Pt complaining of headache  

## 2017-04-16 ENCOUNTER — Other Ambulatory Visit: Payer: Self-pay | Admitting: Interventional Cardiology

## 2017-04-16 DIAGNOSIS — I480 Paroxysmal atrial fibrillation: Secondary | ICD-10-CM

## 2017-06-08 DIAGNOSIS — R413 Other amnesia: Secondary | ICD-10-CM | POA: Diagnosis not present

## 2017-06-08 DIAGNOSIS — Z79899 Other long term (current) drug therapy: Secondary | ICD-10-CM | POA: Diagnosis not present

## 2017-06-08 DIAGNOSIS — E119 Type 2 diabetes mellitus without complications: Secondary | ICD-10-CM | POA: Diagnosis not present

## 2017-06-08 DIAGNOSIS — Z7984 Long term (current) use of oral hypoglycemic drugs: Secondary | ICD-10-CM | POA: Diagnosis not present

## 2017-06-08 DIAGNOSIS — I10 Essential (primary) hypertension: Secondary | ICD-10-CM | POA: Diagnosis not present

## 2017-06-08 DIAGNOSIS — I509 Heart failure, unspecified: Secondary | ICD-10-CM | POA: Diagnosis not present

## 2017-06-08 DIAGNOSIS — I4891 Unspecified atrial fibrillation: Secondary | ICD-10-CM | POA: Diagnosis not present

## 2017-06-08 DIAGNOSIS — F418 Other specified anxiety disorders: Secondary | ICD-10-CM | POA: Diagnosis not present

## 2017-06-09 DIAGNOSIS — F0390 Unspecified dementia without behavioral disturbance: Secondary | ICD-10-CM | POA: Diagnosis not present

## 2017-06-09 DIAGNOSIS — I509 Heart failure, unspecified: Secondary | ICD-10-CM | POA: Diagnosis not present

## 2017-06-09 DIAGNOSIS — I10 Essential (primary) hypertension: Secondary | ICD-10-CM | POA: Diagnosis not present

## 2017-06-09 DIAGNOSIS — E119 Type 2 diabetes mellitus without complications: Secondary | ICD-10-CM | POA: Diagnosis not present

## 2017-06-09 DIAGNOSIS — I4891 Unspecified atrial fibrillation: Secondary | ICD-10-CM | POA: Diagnosis not present

## 2017-06-09 DIAGNOSIS — I701 Atherosclerosis of renal artery: Secondary | ICD-10-CM | POA: Diagnosis not present

## 2017-06-09 DIAGNOSIS — F341 Dysthymic disorder: Secondary | ICD-10-CM | POA: Diagnosis not present

## 2017-06-09 DIAGNOSIS — Z7984 Long term (current) use of oral hypoglycemic drugs: Secondary | ICD-10-CM | POA: Diagnosis not present

## 2017-06-19 DIAGNOSIS — R51 Headache: Secondary | ICD-10-CM | POA: Diagnosis not present

## 2017-07-01 ENCOUNTER — Other Ambulatory Visit: Payer: Self-pay | Admitting: Internal Medicine

## 2017-07-01 ENCOUNTER — Ambulatory Visit
Admission: RE | Admit: 2017-07-01 | Discharge: 2017-07-01 | Disposition: A | Discharge status: Home / Self Care | Payer: Medicare Other | Source: Ambulatory Visit | Attending: Internal Medicine | Admitting: Internal Medicine

## 2017-07-01 DIAGNOSIS — R0781 Pleurodynia: Secondary | ICD-10-CM | POA: Diagnosis not present

## 2017-07-01 DIAGNOSIS — R079 Chest pain, unspecified: Secondary | ICD-10-CM

## 2017-07-01 DIAGNOSIS — R0789 Other chest pain: Secondary | ICD-10-CM | POA: Diagnosis not present

## 2017-08-17 ENCOUNTER — Ambulatory Visit (INDEPENDENT_AMBULATORY_CARE_PROVIDER_SITE_OTHER): Payer: Medicare Other | Admitting: Nurse Practitioner

## 2017-08-17 ENCOUNTER — Telehealth: Payer: Self-pay | Admitting: *Deleted

## 2017-08-17 ENCOUNTER — Ambulatory Visit: Payer: Medicare Other | Admitting: Nurse Practitioner

## 2017-08-17 ENCOUNTER — Encounter: Payer: Self-pay | Admitting: Nurse Practitioner

## 2017-08-17 VITALS — BP 140/60 | HR 57 | Ht 62.0 in | Wt 105.4 lb

## 2017-08-17 DIAGNOSIS — I701 Atherosclerosis of renal artery: Secondary | ICD-10-CM

## 2017-08-17 DIAGNOSIS — I481 Persistent atrial fibrillation: Secondary | ICD-10-CM

## 2017-08-17 DIAGNOSIS — Z79899 Other long term (current) drug therapy: Secondary | ICD-10-CM

## 2017-08-17 DIAGNOSIS — I4819 Other persistent atrial fibrillation: Secondary | ICD-10-CM

## 2017-08-17 LAB — CBC
Hematocrit: 41.4 % (ref 34.0–46.6)
Hemoglobin: 14.2 g/dL (ref 11.1–15.9)
MCH: 32.7 pg (ref 26.6–33.0)
MCHC: 34.3 g/dL (ref 31.5–35.7)
MCV: 95 fL (ref 79–97)
Platelets: 192 10*3/uL (ref 150–450)
RBC: 4.34 x10E6/uL (ref 3.77–5.28)
RDW: 14.1 % (ref 12.3–15.4)
WBC: 3 10*3/uL — ABNORMAL LOW (ref 3.4–10.8)

## 2017-08-17 LAB — MAGNESIUM: Magnesium: 2.2 mg/dL (ref 1.6–2.3)

## 2017-08-17 LAB — BASIC METABOLIC PANEL
BUN/Creatinine Ratio: 20 (ref 12–28)
BUN: 22 mg/dL (ref 8–27)
CO2: 20 mmol/L (ref 20–29)
Calcium: 10.2 mg/dL (ref 8.7–10.3)
Chloride: 104 mmol/L (ref 96–106)
Creatinine, Ser: 1.09 mg/dL — ABNORMAL HIGH (ref 0.57–1.00)
GFR calc Af Amer: 55 mL/min/{1.73_m2} — ABNORMAL LOW (ref >59)
GFR calc non Af Amer: 48 mL/min/{1.73_m2} — ABNORMAL LOW (ref >59)
Glucose: 82 mg/dL (ref 65–99)
Potassium: 4.1 mmol/L (ref 3.5–5.2)
Sodium: 140 mmol/L (ref 134–144)

## 2017-08-17 MED ORDER — APIXABAN 2.5 MG PO TABS: 2.5000 mg | ORAL_TABLET | Freq: Two times a day (BID) | ORAL | 6 refills | Status: AC

## 2017-08-17 NOTE — Patient Instructions (Addendum)
We will be checking the following labs today - BMET, CBC and MG level   Medication Instructions:    Continue with your current medicines. BUT  I am cutting the Eliquis back to 2.5 mg twice a day    Testing/Procedures To Be Arranged:  N/A  Follow-Up:   See me in 6 months    Other Special Instructions:   I will call your daughter to make sure we have all your medicines correct    If you need a refill on your cardiac medications before your next appointment, please call your pharmacy.   Call the [place] office at [phone] if you have any questions, problems or concerns.

## 2017-08-17 NOTE — Progress Notes (Signed)
CARDIOLOGY OFFICE NOTE  Date:  08/17/2017    [name] Date of Birth: 10-31-1935 Medical Record #[id number]  PCP:  Josetta Huddle, MD  Cardiologist:  Jennings Books    Chief Complaint  Patient presents with  . Follow-up    6 month check - seen for Dr. Tamala Julian    History of Present Illness: Donna [name] is a 82 y.o. female who presents today for a 6 month check. Seen for Dr. Tamala Julian.   She has a history of atherosclerotic kidney disease, hypertension, paroxysmal atrial fibrillation, chronic diastolic heart failure when in A. fib, and chronic anticoagulation therapy. On chronic dofetilide andapixaban therapy.  Last seen by Dr. Tamala Julian in November of 2017. Was doing ok at that visit. Then lost to follow up until February of this year - seen by me - medicines very unclear.  I suspected there is some degree of medication non compliance - her bottle of Eliquis dated back to 2017. Unclear to me how she had gotten her Tikosyn. Husband was a little vague on these issues.  Had not kept her follow up with [facility] as well - she did not feel like she needed to go back.   Comes in today. Here with her husband. Again, the medicines are very unclear - multiple medicines to reconcile. Neither of them know what she is taking - some are new. Her list from Dr. Inda Merlin do not match up as well. Apparently their daughter does the medicines - her name is [name] with phone # [phone]. They have verbally given the ok for me to speak with her to clarify.   Himani feels like she is doing well - says "doing the best with what I got". No chest pain. Breathing is ok. No falls reported. Has turned 89. Weight is less than 60 kg.   Past Medical History:  Diagnosis Date  . Atherosclerosis of renal artery (HCC)    a. s/p R RA stenting;  b. 04/2014 Renal Art duplex: Patent R RA stent, <60% L RA stenosis. Followed by [facility].  . Carotid arterial disease ([place])    a. 01/2014 Carotid U/S: RICA 26%, LICA 37%.  Followed by [facility].  . CKD (chronic kidney disease), stage III (HCC)    Stage II/III  . Essential hypertension   . Fibrocystic breast   . Fibromuscular dysplasia ([place])   . Hyperglycemia   . Mitral regurgitation    a. Echo 08/2014: mild MR.  . Paroxysmal atrial fibrillation ([place])    a. Dx 07/26/2014, CHA2DS2VASc = 5-->Eliquis;  c. 08/2014 Echo: EF 55-60%, no rwma, mildly dil LA/RA, mod-sev TR, PASP 59mmHg. b. s/p DCCV 08/2014. c. back in atrial flutter 09/2014 -> rate control pursued.  . Paroxysmal atrial flutter ([place])    a. Dx 07/26/2014, CHA2DS2VASc = 5-->Eliquis;  c. 08/2014 Echo: EF 55-60%, no rwma, mildly dil LA/RA, mod-sev TR, PASP 68mmHg. b. s/p DCCV 08/2014. c. back in atrial flutter 09/2014 -> rate control pursued.  . Tricuspid regurgitation    a. Echo 08/2014: mod-severe.    Past Surgical History:  Procedure Laterality Date  . CARDIOVERSION N/A 09/01/2014   Procedure: CARDIOVERSION;  Surgeon: Josue Hector, MD;  Location: [facility] ENDOSCOPY;  Service: Cardiovascular;  Laterality: N/A;  . CARDIOVERSION N/A 11/23/2014   Procedure: CARDIOVERSION;  Surgeon: Skeet Latch, MD;  Location: [facility];  Service: Cardiovascular;  Laterality: N/A;  . HEMORROIDECTOMY    . REDUCTION MAMMAPLASTY Bilateral   . RENAL ARTERY STENT  2008   Right renal artery by Dr. Drucie Opitz  . TONSILLECTOMY    . TUBAL LIGATION  1970's     Medications: Current Meds  Medication Sig  . acetaminophen (TYLENOL) 650 MG CR tablet Take 650 mg every 8 (eight) hours as needed by mouth for pain.  . B Complex-C (SUPER B COMPLEX PO) Take 1 tablet by mouth daily.  . carvedilol (COREG) 6.25 MG tablet Take 1 tablet (6.25 mg total) by mouth 2 (two) times daily.  Marland Kitchen dofetilide (TIKOSYN) 125 MCG capsule Take three (3) capsules by mouth twice daily.  . furosemide (LASIX) 20 MG tablet Take 1 tablet (20 mg total) by mouth daily.  . magnesium oxide (MAG-OX) 400 MG tablet Take 1 tablet (400 mg total) by mouth every morning.  . Multiple  Vitamin (MULTIVITAMIN WITH MINERALS) TABS tablet Take 1 tablet by mouth daily.  . nitroGLYCERIN (NITROSTAT) 0.4 MG SL tablet Place 1 tablet (0.4 mg total) under the tongue every 5 (five) minutes as needed for chest pain.  . potassium chloride SA (KLOR-CON M20) 20 MEQ tablet Take 1 tablet (20 mEq total) by mouth daily.  . vitamin E 400 UNIT capsule Take 400 Units by mouth daily.  . [DISCONTINUED] apixaban (ELIQUIS) 5 MG TABS tablet Take 1 tablet (5 mg total) by mouth 2 (two) times daily.     Allergies: Allergies  Allergen Reactions  . Tagamet [Cimetidine] Hives  . Diovan [Valsartan]     Hair loss    Social History: The patient  reports that she has never smoked. She has never used smokeless tobacco. She reports that she drinks alcohol. She reports that she does not use drugs.   Family History: The patient's family history includes Aneurysm in her father; Cancer (age of onset: 71) in her sister; Diabetes in her father and mother; Heart attack in her father; Hypertension in her father and mother; Stroke (age of onset: 32) in her mother.   Review of Systems: Please see the history of present illness.   Otherwise, the review of systems is positive for none.   All other systems are reviewed and negative.   Physical Exam: VS:  BP 140/60 (BP Location: Left Arm, Patient Position: Sitting, Cuff Size: Normal)   Pulse (!) 57   Ht 5\' 2"  (1.575 m)   Wt 105 lb 6.4 oz (47.8 kg)   BMI 19.28 kg/m  .  BMI Body mass index is 19.28 kg/m.  Wt Readings from Last 3 Encounters:  08/17/17 105 lb 6.4 oz (47.8 kg)  03/18/17 105 lb (47.6 kg)  02/16/17 113 lb (51.3 kg)    General: Pleasant. Elderly. Alert and in no acute distress.   HEENT: Normal.  Neck: Supple, no JVD, carotid bruits, or masses noted.  Cardiac: Regular rate and rhythm. No murmurs, rubs, or gallops. No edema.  Respiratory:  Lungs are clear to auscultation bilaterally with normal work of breathing.  GI: Soft and nontender.  MS: No  deformity or atrophy. Gait and ROM intact.  Skin: Warm and dry. Color is normal.  Neuro:  Strength and sensation are intact and no gross focal deficits noted.  Psych: Alert, appropriate and with normal affect.   LABORATORY DATA:  EKG:  EKG is ordered today. This demonstrates NSR with septal Q's.  Lab Results  Component Value Date   WBC 6.5 03/18/2017   HGB 14.3 03/18/2017   HCT 42.3 03/18/2017   PLT 237 03/18/2017   GLUCOSE 96 03/18/2017   CHOL 179 02/16/2017  TRIG 103 02/16/2017   HDL 57 02/16/2017   LDLCALC 101 (H) 02/16/2017   ALT 13 (L) 03/18/2017   AST 21 03/18/2017   NA 141 03/18/2017   K 4.4 03/18/2017   CL 106 03/18/2017   CREATININE 1.00 03/18/2017   BUN 27 (H) 03/18/2017   CO2 27 03/18/2017   TSH 0.443 11/19/2014   INR 1.54 (H) 11/23/2014   HGBA1C 5.8 (H) 08/31/2015     BNP (last 3 results) No results for input(s): BNP in the last 8760 hours.  ProBNP (last 3 results) No results for input(s): PROBNP in the last 8760 hours.   Other Studies Reviewed Today:   Assessment/Plan:  1. PAF - on Tikosyn - she remains in NSR - she needs follow up lab today.   2. High risk medicine 0-  Lab today ok. We need to get her medicines verified. Will check the their daughter to verify.   3. HTN - BP is fair. No changes made.   4. PAD - previously followed by [facility] - she did not keep her follow up - I was able to get her a renal duplex from earlier this year - has 60% on the right and greater than 60% on the left - for repeat study in February of 2020 - her carotid disease was stable.  Bp is currently ok. I have not made any changes today.   5. Chronic diastolic HF - remains compensated. She denies any symptoms.    6. Atypical chest wall pain in the past - she has used Voltaren gel with relief - currently denies.  7. Chronic anticoagulation - will cut the dose of Eliquis back - she is now 82 years of age and weighs less than 60 kg.    Current medicines are  reviewed with the patient today.  The patient does not have concerns regarding medicines other than what has been noted above.  The following changes have been made:  See above.  Labs/ tests ordered today include:    Orders Placed This Encounter  Procedures  . Basic metabolic panel  . CBC  . Magnesium  . EKG 12-Lead     Disposition:   FU with me in 6 months.   Patient is agreeable to this plan and will call if any problems develop in the interim.   SignedTruitt Merle, NP  08/17/2017 10:25 AM  [facility] [address] [place], [place]  [address] Phone: [phone] Fax: [phone]

## 2017-08-17 NOTE — Telephone Encounter (Signed)
lvm for pt's daughter Jerrye Noble @ [phone] to discuss pt's medications per pt and husband it was ok.  Did not see DPR in system.  Pt and husband very confused about pt's medication's.  Stated to call daughter due to daughter filling pt's medications.

## 2017-08-19 ENCOUNTER — Other Ambulatory Visit: Payer: Self-pay | Admitting: *Deleted

## 2017-08-19 ENCOUNTER — Telehealth: Payer: Self-pay | Admitting: Nurse Practitioner

## 2017-08-19 MED ORDER — AMLODIPINE BESYLATE 5 MG PO TABS: 7.5000 mg | ORAL_TABLET | Freq: Every day | ORAL | 3 refills | Status: DC

## 2017-08-19 NOTE — Telephone Encounter (Signed)
S/w pt's daughter to go over pt's medications.  No DPR in system but pt gave ok in office to s/w daughter.  Daughter is the one who fixes pt's medications. Will send DPR to pt's house to get filled out. Will send updated medication list to daughter's house to confirm at [address], [place], [place] [address].  Contacts updated. If pt's daughter has changes in medication list will contact office.

## 2017-08-19 NOTE — Telephone Encounter (Signed)
New Message    Patients daughter is calling in reference to her mother medication. Please call to discuss.

## 2017-08-20 NOTE — Telephone Encounter (Signed)
Updated medication list has been mailed to pt's daughter per conversationi.  DPR has been mailed to pt to be filled out for daughter to talk with [facility] office's.

## 2017-08-27 DIAGNOSIS — R079 Chest pain, unspecified: Secondary | ICD-10-CM | POA: Diagnosis not present

## 2017-08-27 DIAGNOSIS — I509 Heart failure, unspecified: Secondary | ICD-10-CM | POA: Diagnosis not present

## 2017-08-27 DIAGNOSIS — R0781 Pleurodynia: Secondary | ICD-10-CM | POA: Diagnosis not present

## 2017-08-27 DIAGNOSIS — Z7984 Long term (current) use of oral hypoglycemic drugs: Secondary | ICD-10-CM | POA: Diagnosis not present

## 2017-08-27 DIAGNOSIS — E1165 Type 2 diabetes mellitus with hyperglycemia: Secondary | ICD-10-CM | POA: Diagnosis not present

## 2017-08-27 DIAGNOSIS — I10 Essential (primary) hypertension: Secondary | ICD-10-CM | POA: Diagnosis not present

## 2017-08-27 DIAGNOSIS — E559 Vitamin D deficiency, unspecified: Secondary | ICD-10-CM | POA: Diagnosis not present

## 2017-08-27 DIAGNOSIS — Z135 Encounter for screening for eye and ear disorders: Secondary | ICD-10-CM | POA: Diagnosis not present

## 2017-08-27 DIAGNOSIS — F0390 Unspecified dementia without behavioral disturbance: Secondary | ICD-10-CM | POA: Diagnosis not present

## 2017-08-27 DIAGNOSIS — E119 Type 2 diabetes mellitus without complications: Secondary | ICD-10-CM | POA: Diagnosis not present

## 2017-08-27 DIAGNOSIS — I4891 Unspecified atrial fibrillation: Secondary | ICD-10-CM | POA: Diagnosis not present

## 2017-08-27 DIAGNOSIS — Z0000 Encounter for general adult medical examination without abnormal findings: Secondary | ICD-10-CM | POA: Diagnosis not present

## 2017-08-27 DIAGNOSIS — I701 Atherosclerosis of renal artery: Secondary | ICD-10-CM | POA: Diagnosis not present

## 2017-08-27 DIAGNOSIS — F341 Dysthymic disorder: Secondary | ICD-10-CM | POA: Diagnosis not present

## 2017-08-27 DIAGNOSIS — Z1389 Encounter for screening for other disorder: Secondary | ICD-10-CM | POA: Diagnosis not present

## 2017-09-04 ENCOUNTER — Other Ambulatory Visit: Payer: Self-pay | Admitting: Internal Medicine

## 2017-09-04 DIAGNOSIS — Z1231 Encounter for screening mammogram for malignant neoplasm of breast: Secondary | ICD-10-CM

## 2017-09-23 ENCOUNTER — Encounter: Payer: Self-pay | Admitting: *Deleted

## 2017-09-29 ENCOUNTER — Other Ambulatory Visit: Payer: Self-pay | Admitting: *Deleted

## 2017-09-29 DIAGNOSIS — I6523 Occlusion and stenosis of bilateral carotid arteries: Secondary | ICD-10-CM

## 2017-09-29 DIAGNOSIS — I701 Atherosclerosis of renal artery: Secondary | ICD-10-CM

## 2017-10-12 ENCOUNTER — Ambulatory Visit
Admission: RE | Admit: 2017-10-12 | Discharge: 2017-10-12 | Disposition: A | Discharge status: Home / Self Care | Payer: Medicare Other | Source: Ambulatory Visit | Attending: Internal Medicine | Admitting: Internal Medicine

## 2017-10-12 DIAGNOSIS — Z1231 Encounter for screening mammogram for malignant neoplasm of breast: Secondary | ICD-10-CM | POA: Diagnosis not present

## 2017-10-20 DIAGNOSIS — Z23 Encounter for immunization: Secondary | ICD-10-CM | POA: Diagnosis not present

## 2017-11-24 DIAGNOSIS — R410 Disorientation, unspecified: Secondary | ICD-10-CM | POA: Diagnosis not present

## 2017-11-26 DIAGNOSIS — R410 Disorientation, unspecified: Secondary | ICD-10-CM | POA: Diagnosis not present

## 2017-11-26 DIAGNOSIS — E119 Type 2 diabetes mellitus without complications: Secondary | ICD-10-CM | POA: Diagnosis not present

## 2017-11-26 DIAGNOSIS — R413 Other amnesia: Secondary | ICD-10-CM | POA: Diagnosis not present

## 2017-11-26 DIAGNOSIS — E559 Vitamin D deficiency, unspecified: Secondary | ICD-10-CM | POA: Diagnosis not present

## 2017-12-12 ENCOUNTER — Other Ambulatory Visit: Payer: Self-pay | Admitting: Interventional Cardiology

## 2017-12-12 DIAGNOSIS — I480 Paroxysmal atrial fibrillation: Secondary | ICD-10-CM

## 2018-01-07 DIAGNOSIS — R51 Headache: Secondary | ICD-10-CM | POA: Diagnosis not present

## 2018-02-15 ENCOUNTER — Emergency Department (HOSPITAL_COMMUNITY)
Admission: EM | Admit: 2018-02-15 | Discharge: 2018-02-15 | Disposition: A | Discharge status: Home / Self Care | Payer: Medicare Other | Attending: Emergency Medicine | Admitting: Emergency Medicine

## 2018-02-15 ENCOUNTER — Emergency Department (HOSPITAL_COMMUNITY): Payer: Medicare Other

## 2018-02-15 ENCOUNTER — Other Ambulatory Visit: Payer: Self-pay

## 2018-02-15 ENCOUNTER — Encounter (HOSPITAL_COMMUNITY): Payer: Self-pay | Admitting: Emergency Medicine

## 2018-02-15 DIAGNOSIS — I130 Hypertensive heart and chronic kidney disease with heart failure and stage 1 through stage 4 chronic kidney disease, or unspecified chronic kidney disease: Secondary | ICD-10-CM | POA: Insufficient documentation

## 2018-02-15 DIAGNOSIS — Z7901 Long term (current) use of anticoagulants: Secondary | ICD-10-CM | POA: Insufficient documentation

## 2018-02-15 DIAGNOSIS — R51 Headache: Secondary | ICD-10-CM | POA: Insufficient documentation

## 2018-02-15 DIAGNOSIS — Z7984 Long term (current) use of oral hypoglycemic drugs: Secondary | ICD-10-CM | POA: Diagnosis not present

## 2018-02-15 DIAGNOSIS — I10 Essential (primary) hypertension: Secondary | ICD-10-CM | POA: Diagnosis not present

## 2018-02-15 DIAGNOSIS — R519 Headache, unspecified: Secondary | ICD-10-CM

## 2018-02-15 DIAGNOSIS — Z79899 Other long term (current) drug therapy: Secondary | ICD-10-CM | POA: Diagnosis not present

## 2018-02-15 DIAGNOSIS — I5032 Chronic diastolic (congestive) heart failure: Secondary | ICD-10-CM | POA: Diagnosis not present

## 2018-02-15 DIAGNOSIS — N183 Chronic kidney disease, stage 3 (moderate): Secondary | ICD-10-CM | POA: Diagnosis not present

## 2018-02-15 LAB — COMPREHENSIVE METABOLIC PANEL
ALT: 12 U/L (ref 0–44)
AST: 22 U/L (ref 15–41)
Albumin: 3.9 g/dL (ref 3.5–5.0)
Alkaline Phosphatase: 41 U/L (ref 38–126)
Anion gap: 6 (ref 5–15)
BUN: 18 mg/dL (ref 8–23)
CO2: 27 mmol/L (ref 22–32)
Calcium: 10.1 mg/dL (ref 8.9–10.3)
Chloride: 110 mmol/L (ref 98–111)
Creatinine, Ser: 1.22 mg/dL — ABNORMAL HIGH (ref 0.44–1.00)
GFR calc Af Amer: 48 mL/min — ABNORMAL LOW (ref >60)
GFR calc non Af Amer: 42 mL/min — ABNORMAL LOW (ref >60)
Glucose, Bld: 99 mg/dL (ref 70–99)
Potassium: 4.3 mmol/L (ref 3.5–5.1)
Sodium: 143 mmol/L (ref 135–145)
Total Bilirubin: 0.3 mg/dL (ref 0.3–1.2)
Total Protein: 6.8 g/dL (ref 6.5–8.1)

## 2018-02-15 LAB — DIFFERENTIAL
Abs Immature Granulocytes: 0.01 10*3/uL (ref 0.00–0.07)
Basophils Absolute: 0 10*3/uL (ref 0.0–0.1)
Basophils Relative: 1 %
Eosinophils Absolute: 0.1 10*3/uL (ref 0.0–0.5)
Eosinophils Relative: 2 %
Immature Granulocytes: 0 %
LYMPHS PCT: 32 %
Lymphs Abs: 1.1 10*3/uL (ref 0.7–4.0)
Monocytes Absolute: 0.4 10*3/uL (ref 0.1–1.0)
Monocytes Relative: 11 %
Neutro Abs: 1.9 10*3/uL (ref 1.7–7.7)
Neutrophils Relative %: 54 %

## 2018-02-15 LAB — PROTIME-INR
INR: 1.17
Prothrombin Time: 14.8 seconds (ref 11.4–15.2)

## 2018-02-15 LAB — CBC
HEMATOCRIT: 40.5 % (ref 36.0–46.0)
Hemoglobin: 12.9 g/dL (ref 12.0–15.0)
MCH: 31.3 pg (ref 26.0–34.0)
MCHC: 31.9 g/dL (ref 30.0–36.0)
MCV: 98.3 fL (ref 80.0–100.0)
Platelets: 166 10*3/uL (ref 150–400)
RBC: 4.12 MIL/uL (ref 3.87–5.11)
RDW: 13.2 % (ref 11.5–15.5)
WBC: 3.4 10*3/uL — ABNORMAL LOW (ref 4.0–10.5)
nRBC: 0 % (ref 0.0–0.2)

## 2018-02-15 LAB — APTT: aPTT: 34 seconds (ref 24–36)

## 2018-02-15 NOTE — Discharge Instructions (Signed)
It was my pleasure taking care of you today!   Please call your primary care doctor to schedule follow up. You can also call the neurology clinic to schedule a follow up appointment with them.   Return to ER for new or worsening symptoms, any additional concerns.

## 2018-02-15 NOTE — ED Provider Notes (Signed)
[facility] EMERGENCY DEPARTMENT Provider Note   CSN: [id number] Arrival date & time: 02/15/18  1503     History   Chief Complaint Chief Complaint  Patient presents with  . Headache    HPI Donna [name] is a 83 y.o. female.  The history is provided by the patient and medical records. No language interpreter was used.  Headache   Donna [name] is a 83 y.o. female  with a PMH as listed below who presents to the Emergency Department complaining of intermittent headaches for the last 4 weeks.  She currently is headache free, but did have a headache earlier today.  She describes this as an aching across the entire occipital region of her head.  Sometimes more so on the left, other times more so on the right.  She denies any associated symptoms.  Specifically, she denies any numbness, tingling, weakness, visual changes, slurred speech, gait changes, fever or neck pain.  She has seen her primary care doctor for this.  Per her husband, he adjusted her blood pressure medication and started her on a memory pill, hoping this would help her headaches.  She has a follow-up appointment with him on February 26.  Past Medical History:  Diagnosis Date  . Atherosclerosis of renal artery (HCC)    a. s/p R RA stenting;  b. 04/2014 Renal Art duplex: Patent R RA stent, <60% L RA stenosis. Follow[facility] by [facility].  . Carotid arterial disease ([place])    a. 01/2014 Carotid U/S: RICA 75%, LICA 91%. Followed by [facility].  . CKD (chronic kidney disease), stage III (HCC)    Stage II/III  . Essential hypertension   . Fibrocystic breast   . Fibromuscular dysplasia ([place])   . Hyperglycemia   . Mitral regurgitation    a. Echo 08/2014: mild MR.  . Paroxysmal atrial fibrillation ([place])    a. Dx 07/26/2014, CHA2DS2VASc = 5-->Eliquis;  c. 08/2014 Echo: EF 55-60%, no rwma, mildly dil LA/RA, mod-sev TR, PASP 52mmHg. b. s/p DCCV 08/2014. c. back in atrial flutter 09/2014 -> rate control pursued.  . Paroxysmal atrial  flutter ([place])    a. Dx 07/26/2014, CHA2DS2VASc = 5-->Eliquis;  c. 08/2014 Echo: EF 55-60%, no rwma, mildly dil LA/RA, mod-sev TR, PASP 37mmHg. b. s/p DCCV 08/2014. c. back in atrial flutter 09/2014 -> rate control pursued.  . Tricuspid regurgitation    a. Echo 08/2014: mod-severe.    Patient Active Problem List   Diagnosis Date Noted  . Chronic anticoagulation 02/22/2015  . Chronic atrial fibrillation 11/19/2014  . Chronic diastolic HF (heart failure) ([place]) 11/19/2014  . CKD (chronic kidney disease), stage III ([place]) 11/19/2014  . Fibromuscular dysplasia ([place])   . Carotid arterial disease ([place])   . Hypertension 05/28/2011  . Menopause 05/28/2011  . Atherosclerotic RAS (renal artery stenosis), bilateral ([place]) 06/28/2010    Past Surgical History:  Procedure Laterality Date  . CARDIOVERSION N/A 09/01/2014   Procedure: CARDIOVERSION;  Surgeon: Josue Hector, MD;  Location: [facility] ENDOSCOPY;  Service: Cardiovascular;  Laterality: N/A;  . CARDIOVERSION N/A 11/23/2014   Procedure: CARDIOVERSION;  Surgeon: Skeet Latch, MD;  Location: [facility];  Service: Cardiovascular;  Laterality: N/A;  . HEMORROIDECTOMY    . REDUCTION MAMMAPLASTY Bilateral   . RENAL ARTERY STENT  2008   Right renal artery by Dr. Drucie Opitz  . TONSILLECTOMY    . TUBAL LIGATION  1970's     OB History    Gravida  4   Para  4   Term  4   Preterm  0   AB  0   Living  4     SAB  0   TAB  0   Ectopic  0   Multiple  0   Live Births               Home Medications    Prior to Admission medications   Medication Sig Start Date End Date Taking? Authorizing Provider  acetaminophen (TYLENOL) 650 MG CR tablet Take 650 mg every 8 (eight) hours as needed by mouth for pain.    [provider]  amLODipine (NORVASC) 5 MG tablet Take 1.5 tablets (7.5 mg total) by mouth daily. 08/19/17 11/17/17  Burtis Junes, NP  apixaban (ELIQUIS) 2.5 MG TABS tablet Take 1 tablet (2.5 mg total) by mouth 2 (two)  times daily. 08/17/17   Burtis Junes, NP  B Complex-C (SUPER B COMPLEX PO) Take 1 tablet by mouth daily.    [provider]  carvedilol (COREG) 6.25 MG tablet Take 1 tablet (6.25 mg total) by mouth 2 (two) times daily. 02/16/17   Burtis Junes, NP  diclofenac sodium (VOLTAREN) 1 % GEL Apply 1 application topically daily. 07/31/17   [provider]  dofetilide (TIKOSYN) 125 MCG capsule TAKE 3 CAPSULES BY MOUTH TWICE DAILY 12/14/17   Belva Crome, MD  furosemide (LASIX) 20 MG tablet Take 1 tablet (20 mg total) by mouth daily. 02/16/17   Burtis Junes, NP  magnesium oxide (MAG-OX) 400 MG tablet Take 1 tablet (400 mg total) by mouth every morning. 02/16/17   Burtis Junes, NP  metFORMIN (GLUCOPHAGE-XR) 500 MG 24 hr tablet Take 500 mg by mouth daily. 08/02/17   [provider]  mirtazapine (REMERON) 15 MG tablet Take 15 mg by mouth daily.    [provider]  Multiple Vitamin (MULTIVITAMIN WITH MINERALS) TABS tablet Take 1 tablet by mouth daily.    [provider]  NAMZARIC 28-10 MG CP24 Take 1 capsule by mouth daily. 06/25/17   [provider]  nitroGLYCERIN (NITROSTAT) 0.4 MG SL tablet Place 1 tablet (0.4 mg total) under the tongue every 5 (five) minutes as needed for chest pain. 11/14/14   Dunn, Nedra Hai, PA-C  potassium chloride SA (KLOR-CON M20) 20 MEQ tablet Take 1 tablet (20 mEq total) by mouth daily. 09/28/14   Dunn, Nedra Hai, PA-C  vitamin E 400 UNIT capsule Take 400 Units by mouth daily.    [provider]    Family History Family History  Problem Relation Age of Onset  . Stroke Mother 107  . Diabetes Mother   . Hypertension Mother   . Aneurysm Father        abdominal aortic aneurysm  . Diabetes Father   . Hypertension Father   . Heart attack Father   . Cancer Sister 45       breast cancer  . Breast cancer Neg Hx     Social History Social History   Tobacco Use  . Smoking status: Never Smoker  . Smokeless  tobacco: Never Used  Substance Use Topics  . Alcohol use: Yes  . Drug use: No     Allergies   Tagamet [cimetidine] and Diovan [valsartan]   Review of Systems Review of Systems  Neurological: Positive for headaches.  All other systems reviewed and are negative.    Physical Exam Updated Vital Signs BP (!) 169/67   Pulse (!) 58  Temp 97.6 F (36.4 C) (Oral)   Resp 11   SpO2 100%   Physical Exam Vitals signs and nursing note reviewed.  Constitutional:      General: She is not in acute distress.    Appearance: She is well-developed. She is not diaphoretic.  HENT:     Head: Normocephalic and atraumatic.  Eyes:     General: No scleral icterus.    Conjunctiva/sclera: Conjunctivae normal.     Pupils: Pupils are equal, round, and reactive to light.     Comments: No nystagmus   Neck:     Musculoskeletal: Normal range of motion and neck supple.     Comments: Full active and passive ROM without pain.  No midline or paraspinal tenderness. No nuchal rigidity or meningeal signs. Cardiovascular:     Rate and Rhythm: Normal rate and regular rhythm.     Heart sounds: Normal heart sounds.  Pulmonary:     Effort: Pulmonary effort is normal. No respiratory distress.     Breath sounds: Normal breath sounds. No wheezing or rales.  Abdominal:     General: Bowel sounds are normal. There is no distension.     Palpations: Abdomen is soft.     Tenderness: There is no abdominal tenderness. There is no guarding or rebound.  Musculoskeletal: Normal range of motion.  Lymphadenopathy:     Cervical: No cervical adenopathy.  Skin:    General: Skin is warm and dry.     Findings: No rash.  Neurological:     Mental Status: She is alert and oriented to person, place, and time.     Cranial Nerves: No cranial nerve deficit.     Coordination: Coordination normal.     Deep Tendon Reflexes: Reflexes are normal and symmetric.     Comments: Alert, oriented, thought content appropriate, able to  give a coherent history. Speech is clear and goal oriented, able to follow commands.  Cranial Nerves:  II:  Peripheral visual fields grossly normal, pupils equal, round, reactive to light III, IV, VI: EOM intact bilaterally, ptosis not present V,VII: smile symmetric, eyes kept closed tightly against resistance, facial light touch sensation equal VIII: hearing grossly normal IX, X: symmetric soft palate movement, uvula elevates symmetrically  XI: bilateral shoulder shrug symmetric and strong XII: midline tongue extension 5/5 muscle strength in upper and lower extremities bilaterally including strong and equal grip strength and dorsiflexion/plantar flexion Sensory to light touch normal in all four extremities.  Normal finger-to-nose and rapid alternating movements. No drift.      ED Treatments / Results  Labs (all labs ordered are listed, but only abnormal results are displayed) Labs Reviewed  CBC - Abnormal; Notable for the following components:      Result Value   WBC 3.4 (*)    All other components within normal limits  COMPREHENSIVE METABOLIC PANEL - Abnormal; Notable for the following components:   Creatinine, Ser 1.22 (*)    GFR calc non Af Amer 42 (*)    GFR calc Af Amer 48 (*)    All other components within normal limits  PROTIME-INR  APTT  DIFFERENTIAL  CBG MONITORING, ED    EKG EKG Interpretation  Date/Time:  Monday February 15 2018 15:19:53 EST Ventricular Rate:  58 PR Interval:  248 QRS Duration: 78 QT Interval:  416 QTC Calculation: 408 R Axis:   51 Text Interpretation:  Sinus bradycardia with 1st degree A-V block Anteroseptal infarct , age undetermined Abnormal ECG Confirmed by Quintella Reichert [phone])  on 02/15/2018 9:14:29 PM   Radiology Ct Head Wo Contrast  Result Date: 02/15/2018 CLINICAL DATA:  Intermittent headache for 1 month. EXAM: CT HEAD WITHOUT CONTRAST TECHNIQUE: Contiguous axial images were obtained from the base of the skull through the vertex  without intravenous contrast. COMPARISON:  03/18/2017. FINDINGS: Brain: No evidence for acute infarction, hemorrhage, mass lesion, hydrocephalus, or extra-axial fluid. Generalized atrophy with prominence of the ventricles, cisterns, and sulci. Hypoattenuation of white matter, likely small vessel disease. Vascular: Calcification of the cavernous internal carotid arteries consistent with cerebrovascular atherosclerotic disease. No signs of intracranial large vessel occlusion. Skull: Calvarium intact. Sinuses/Orbits: No acute or significant findings. Other: No middle ear or mastoid fluid. Compared with priors, similar appearance. IMPRESSION: Atrophy and small vessel disease. No acute intracranial findings. Electronically Signed   By: Staci Righter M.D.   On: 02/15/2018 17:09    Procedures Procedures (including critical care time)  Medications Ordered in ED Medications - No data to display   Initial Impression / Assessment and Plan / ED Course  I have reviewed the triage vital signs and the nursing notes.  Pertinent labs & imaging results that were available during my care of the patient were reviewed by me and considered in my medical decision making (see chart for details).    Donna [name] is a 83 y.o. female who presents to ED for intermittent headaches for the last month.  Currently asymptomatic.  No focal neuro deficits on exam.  Labs and CT scan with no acute findings. Evaluation does not show pathology that would require ongoing emergent intervention or inpatient treatment.  Will have her follow-up with her primary care doctor and neurology.  Reasons to return to the emergency department were discussed and all questions answered.  Patient seen by and discussed with Dr. Laverta Baltimore who agrees with treatment plan.    Final Clinical Impressions(s) / ED Diagnoses   Final diagnoses:  Bad headache    ED Discharge Orders    None       Tyerra Loretto, Ozella Almond, PA-C 02/15/18 2142    Margette Fast, MD 02/16/18 [phone]

## 2018-02-15 NOTE — ED Triage Notes (Signed)
Pt reports intermittent headache x 1 month. Pt reports she woke up at 1 am this morning with a headache and it hasn't gone away. Pt reports she has no hx of migraines. She denies weakness, numbness, change in vision, or problems with balance. Speech is clear, no drift noted to extremities.

## 2018-02-16 ENCOUNTER — Ambulatory Visit: Payer: Medicare Other | Admitting: Nurse Practitioner

## 2018-02-16 DIAGNOSIS — G4489 Other headache syndrome: Secondary | ICD-10-CM | POA: Diagnosis not present

## 2018-02-18 ENCOUNTER — Ambulatory Visit
Admission: RE | Admit: 2018-02-18 | Discharge: 2018-02-18 | Disposition: A | Discharge status: Home / Self Care | Payer: Medicare Other | Source: Ambulatory Visit | Attending: Neurology | Admitting: Neurology

## 2018-02-18 ENCOUNTER — Ambulatory Visit (INDEPENDENT_AMBULATORY_CARE_PROVIDER_SITE_OTHER): Payer: Medicare Other | Admitting: Neurology

## 2018-02-18 ENCOUNTER — Encounter: Payer: Self-pay | Admitting: Neurology

## 2018-02-18 ENCOUNTER — Telehealth: Payer: Self-pay | Admitting: Neurology

## 2018-02-18 VITALS — BP 178/80 | HR 60 | Ht 62.0 in | Wt 104.0 lb

## 2018-02-18 DIAGNOSIS — M47812 Spondylosis without myelopathy or radiculopathy, cervical region: Secondary | ICD-10-CM | POA: Diagnosis not present

## 2018-02-18 DIAGNOSIS — M542 Cervicalgia: Secondary | ICD-10-CM | POA: Diagnosis not present

## 2018-02-18 DIAGNOSIS — G4489 Other headache syndrome: Secondary | ICD-10-CM

## 2018-02-18 MED ORDER — GABAPENTIN 100 MG PO CAPS: ORAL_CAPSULE | ORAL | 3 refills | Status: DC

## 2018-02-18 NOTE — Progress Notes (Signed)
Reason for visit: Headache  Referring physician: [place]  Donna [name] is a 83 y.o. female  History of present illness:  Donna [name] is an 83 year old right-handed black female with a history of headaches that began almost a year ago, she had a CT scan of the brain in March 2019 because of headaches.  Over time, the headaches have become more frequent, she is now having headaches about every other day.  The patient has a history of a memory disturbance, she has been on Namzeric, she comes into the office today with her husband who mainly gives the history.  The patient has been taken off of Namzeric as it was feared that the medication was causing the headaches, but the patient has been on this medication for a number of years.  The patient has had some weight loss on Namzeric recently.  The patient reports that the headaches are in the occipital areas bilaterally, she does have some discomfort into the upper neck areas as well.  The headaches are a dull achy pain, they are never disabling, not associated with nausea or vomiting or photophobia or phonophobia.  She denies any severe neck stiffness or pain into the shoulders or down the arms.  The patient reports no numbness or weakness of the face, arms, legs.  She denies any balance issues or difficulty controlling the bowels or the bladder.  The husband does not relate any other medication adjustments or new medications that correlate with the onset of the headache.  The headaches tend to be more significant in the morning.  The patient takes Tylenol for her arthritis and this seems to help the headache as well.  The patient is on anticoagulant therapy.  A recent CT scan of the brain was done, this was unremarkable.  Past Medical History:  Diagnosis Date  . Atherosclerosis of renal artery (HCC)    a. s/p R RA stenting;  b. 04/2014 Renal Art duplex: Patent R RA stent, <60% L RA stenosis. [facility] by [facility].  . Carotid arterial disease ([place])    a.  01/2014 Carotid U/S: RICA 86%, LICA 57%. Followed by [facility].  . CKD (chronic kidney disease), stage III (HCC)    Stage II/III  . Essential hypertension   . Fibrocystic breast   . Fibromuscular dysplasia ([place])   . Hyperglycemia   . Mitral regurgitation    a. Echo 08/2014: mild MR.  . Paroxysmal atrial fibrillation ([place])    a. Dx 07/26/2014, CHA2DS2VASc = 5-->Eliquis;  c. 08/2014 Echo: EF 55-60%, no rwma, mildly dil LA/RA, mod-sev TR, PASP 55mmHg. b. s/p DCCV 08/2014. c. back in atrial flutter 09/2014 -> rate control pursued.  . Paroxysmal atrial flutter ([place])    a. Dx 07/26/2014, CHA2DS2VASc = 5-->Eliquis;  c. 08/2014 Echo: EF 55-60%, no rwma, mildly dil LA/RA, mod-sev TR, PASP 56mmHg. b. s/p DCCV 08/2014. c. back in atrial flutter 09/2014 -> rate control pursued.  . Tricuspid regurgitation    a. Echo 08/2014: mod-severe.    Past Surgical History:  Procedure Laterality Date  . CARDIOVERSION N/A 09/01/2014   Procedure: CARDIOVERSION;  Surgeon: Josue Hector, MD;  Location: [facility] ENDOSCOPY;  Service: Cardiovascular;  Laterality: N/A;  . CARDIOVERSION N/A 11/23/2014   Procedure: CARDIOVERSION;  Surgeon: Skeet Latch, MD;  Location: [place];  Service: Cardiovascular;  Laterality: N/A;  . HEMORROIDECTOMY    . REDUCTION MAMMAPLASTY Bilateral   . RENAL ARTERY STENT  2008   Right renal artery by Dr. Drucie Opitz  .  TONSILLECTOMY    . TUBAL LIGATION  1970's    Family History  Problem Relation Age of Onset  . Stroke Mother 33  . Diabetes Mother   . Hypertension Mother   . Aneurysm Father        abdominal aortic aneurysm  . Diabetes Father   . Hypertension Father   . Heart attack Father   . Cancer Sister 48       breast cancer  . Breast cancer Neg Hx     Social history:  reports that she has never smoked. She has never used smokeless tobacco. She reports current alcohol use. She reports that she does not use drugs.  Medications:  Prior to Admission medications   Medication Sig Start Date  End Date Taking? Authorizing Provider  acetaminophen (TYLENOL) 650 MG CR tablet Take 650 mg every 8 (eight) hours as needed by mouth for pain.   Yes [provider]  apixaban (ELIQUIS) 2.5 MG TABS tablet Take 1 tablet (2.5 mg total) by mouth 2 (two) times daily. 08/17/17  Yes Burtis Junes, NP  B Complex-C (SUPER B COMPLEX PO) Take 1 tablet by mouth daily.   Yes [provider]  carvedilol (COREG) 6.25 MG tablet Take 1 tablet (6.25 mg total) by mouth 2 (two) times daily. 02/16/17  Yes Burtis Junes, NP  diclofenac sodium (VOLTAREN) 1 % GEL Apply 1 application topically daily. 07/31/17  Yes [provider]  dofetilide (TIKOSYN) 125 MCG capsule TAKE 3 CAPSULES BY MOUTH TWICE DAILY 12/14/17  Yes Belva Crome, MD  furosemide (LASIX) 20 MG tablet Take 1 tablet (20 mg total) by mouth daily. 02/16/17  Yes Burtis Junes, NP  magnesium oxide (MAG-OX) 400 MG tablet Take 1 tablet (400 mg total) by mouth every morning. 02/16/17  Yes Burtis Junes, NP  metFORMIN (GLUCOPHAGE-XR) 500 MG 24 hr tablet Take 500 mg by mouth daily. 08/02/17  Yes [provider]  mirtazapine (REMERON) 15 MG tablet Take 15 mg by mouth daily.   Yes [provider]  Multiple Vitamin (MULTIVITAMIN WITH MINERALS) TABS tablet Take 1 tablet by mouth daily.   Yes [provider]  nitroGLYCERIN (NITROSTAT) 0.4 MG SL tablet Place 1 tablet (0.4 mg total) under the tongue every 5 (five) minutes as needed for chest pain. 11/14/14  Yes Dunn, Dayna N, PA-C  potassium chloride SA (KLOR-CON M20) 20 MEQ tablet Take 1 tablet (20 mEq total) by mouth daily. 09/28/14  Yes Dunn, Dayna N, PA-C  vitamin E 400 UNIT capsule Take 400 Units by mouth daily.   Yes [provider]  amLODipine (NORVASC) 5 MG tablet Take 1.5 tablets (7.5 mg total) by mouth daily. 08/19/17 11/17/17  Burtis Junes, NP  NAMZARIC 28-10 MG CP24 Take 1 capsule by mouth daily. 06/25/17   [provider]        Allergies  Allergen Reactions  . Tagamet [Cimetidine] Hives  . Diovan [Valsartan]     Hair loss    ROS:  Out of a complete 14 system review of symptoms, the patient complains only of the following symptoms, and all other reviewed systems are negative.  Fevers, chills, weight loss Snoring Memory loss, headache  Blood pressure (!) 178/80, pulse 60, height 5\' 2"  (1.575 m), weight 104 lb (47.2 kg).  Physical Exam  General: The patient is alert and cooperative at the time of the examination.  Eyes: Pupils are equal, round, and reactive to light. Discs are flat bilaterally.  Neck: The neck is supple, no carotid bruits are noted.  Respiratory: The respiratory examination is clear.  Cardiovascular: The cardiovascular examination reveals a regular rate and rhythm, no obvious murmurs or rubs are noted.  Neuromuscular: Range of movement of the cervical spine lacks about 20 degrees of full lateral rotation bilaterally.  Skin: Extremities are without significant edema.  Neurologic Exam  Mental status: The patient is alert and oriented x 3 at the time of the examination. The patient does repeat herself during the examination with apparent short-term memory issues.  Cranial nerves: Facial symmetry is present. There is good sensation of the face to pinprick and soft touch bilaterally. The strength of the facial muscles and the muscles to head turning and shoulder shrug are normal bilaterally. Speech is well enunciated, no aphasia or dysarthria is noted. Extraocular movements are full. Visual fields are full. The tongue is midline, and the patient has symmetric elevation of the soft palate. No obvious hearing deficits are noted.  Motor: The motor testing reveals 5 over 5 strength of all 4 extremities. Good symmetric motor tone is noted throughout.  Sensory: Sensory testing is intact to pinprick, soft touch, vibration sensation, and position sense on all 4 extremities. No evidence of  extinction is noted.  Coordination: Cerebellar testing reveals good finger-nose-finger and heel-to-shin bilaterally.  Gait and station: Gait is normal. Tandem gait is normal. Romberg is negative. No drift is seen.  Reflexes: Deep tendon reflexes are symmetric and normal bilaterally. Toes are downgoing bilaterally.    CT head 02/15/18:  IMPRESSION: Atrophy and small vessel disease. No acute intracranial findings.  * CT scan images were reviewed online. I agree with the written report.    Assessment/Plan:  1.  Occipital headache, possible cervicogenic headache  The patient will be sent for further blood work today to include a sedimentation rate.  The patient be placed on gabapentin taking 100 mg in the morning and 200 mg in the evening.  An x-ray of the cervical spine will be done.  We may consider neuromuscular therapy in the future, she will follow-up here in about 3 months.  The patient will call for any dose adjustments of the medication.  Jill Alexanders MD 02/18/2018 7:31 AM  [facility] [address] [place], [place] [address]  Phone [phone] Fax [phone]

## 2018-02-18 NOTE — Patient Instructions (Signed)
We will start gabapentin 100 mg taking one in the morning and 2 in the evening.  Neurontin (gabapentin) may result in drowsiness, ankle swelling, gait instability, or possibly dizziness. Please contact our office if significant side effects occur with this medication.

## 2018-02-18 NOTE — Telephone Encounter (Signed)
I called the patient.  The x-ray of the cervical spine does show multilevel degenerative changes, the patient could be at risk for cervicogenic headache.    XR cervical 02/18/18:  FINDINGS: No fracture or bone lesion.  Grade 1 anterolisthesis, C3-C4 and C4-C5. No other spondylolisthesis.  Mild loss of disc height at C3-C4. Moderate loss of disc height at C5-C6 and C6-C7. There are endplate spurs most evident at C5-C6 and C6-C7. Facet degenerative changes are noted bilaterally, most severe on the right at C4-C5.  Soft tissues are unremarkable.  IMPRESSION: 1. No fracture or acute finding. 2. Degenerative changes as described.

## 2018-02-19 ENCOUNTER — Telehealth: Payer: Self-pay

## 2018-02-19 LAB — ANA W/REFLEX: Anti Nuclear Antibody(ANA): NEGATIVE

## 2018-02-19 LAB — C-REACTIVE PROTEIN: CRP: <1 mg/L (ref 0–10)

## 2018-02-19 LAB — SEDIMENTATION RATE: Sed Rate: 2 mm/hr (ref 0–40)

## 2018-02-19 NOTE — Telephone Encounter (Signed)
-----   Message from Kathrynn Ducking, MD sent at 02/19/2018  1:12 PM EST -----  The blood work results are unremarkable. Please call the patient. ----- Message ----- From: Lavone Neri Lab Results In Sent: 02/19/2018   7:38 AM EST To: Kathrynn Ducking, MD

## 2018-02-19 NOTE — Telephone Encounter (Signed)
I was able to contact the pt's husband(Jerome ok per DPR) and advise of lab results per [place]. Husband verbalized understanding and will advise pt.  Husband had no further questions/concerns and verbalized appreciation for the call.

## 2018-02-23 ENCOUNTER — Ambulatory Visit (INDEPENDENT_AMBULATORY_CARE_PROVIDER_SITE_OTHER): Payer: Medicare Other | Admitting: Nurse Practitioner

## 2018-02-23 ENCOUNTER — Encounter: Payer: Self-pay | Admitting: Nurse Practitioner

## 2018-02-23 VITALS — BP 150/80 | HR 58 | Ht 62.0 in | Wt 107.1 lb

## 2018-02-23 DIAGNOSIS — Z7901 Long term (current) use of anticoagulants: Secondary | ICD-10-CM | POA: Diagnosis not present

## 2018-02-23 DIAGNOSIS — I4819 Other persistent atrial fibrillation: Secondary | ICD-10-CM

## 2018-02-23 NOTE — Patient Instructions (Addendum)
We will be checking the following labs today - BMET, Mag level & CBC    Medication Instructions:    Continue with your current medicines.   We are working on trying to get the cost of your Tikosyn down  Samples of Eliquis if available   If you need a refill on your cardiac medications before your next appointment, please call your pharmacy.     Testing/Procedures To Be Arranged:  N/A  Follow-Up:   See me in 6 months.    At [facility], you and your health needs are our priority.  As part of our continuing mission to provide you with exceptional heart care, we have created designated Provider Care Teams.  These Care Teams include your primary Cardiologist (physician) and Advanced Practice Providers (APPs -  Physician Assistants and Nurse Practitioners) who all work together to provide you with the care you need, when you need it.  Special Instructions:  . None  Call the [place] office at [phone] if you have any questions, problems or concerns.

## 2018-02-23 NOTE — Progress Notes (Signed)
CARDIOLOGY OFFICE NOTE  Date:  02/23/2018    Donna [name] Date of Birth: September 21, 1935 Medical Record #[id number]  PCP:  Josetta Huddle, MD  Cardiologist:  Jennings Books    Chief Complaint  Patient presents with  . Atrial Fibrillation    Follow up visit - seen for Dr. Tamala Julian    History of Present Illness: Donna [name] is a 82 y.o. female who presents today for a 6 month check. Seen for Dr. Tamala Julian.   She has a history of atherosclerotic kidney disease, hypertension, paroxysmal atrial fibrillation, chronic diastolic heart failure when in A. fib, and chronic anticoagulation therapy. She is on chronic dofetilide andapixaban therapy.  Last seen by Dr. Tamala Julian in November of 2017. Was doing ok at that visit.Then lost to follow up until February of 2019 - then seen by me - medicines very unclear.  I suspected there is some degree of medication non compliance - her bottle of Eliquis dated back to 2017. Unclear to me how she had gotten her Tikosyn. Husband was a little vague on these issues. Had not kept her follow up with [facility] as well - she did not feel like she needed to go back.   Last seen by me in August - medicines very unclear again. She was felt to be doing ok overall from our standpoint.   Comes in today. Here withher husband. Remains unclear about her medicines. They both say she is doing well. There have been some med changes for her dementia due to concern for headaches. She has seen neurology for headaches - felt to be arthritis - negative CT of the head. On low dose Eliquis - no issues noted. Cost of her Phyllis Ginger very pricey - was $1400 - asking about some assistance but they wish to continue this. Not dizzy or lightheaded. No syncope. No chest pain.   Past Medical History:  Diagnosis Date  . Atherosclerosis of renal artery (HCC)    a. s/p R RA stenting;  b. 04/2014 Renal Art duplex: Patent R RA stent, <60% L RA stenosis. Follow[facility] by [facility].  . Carotid arterial disease  ([place])    a. 01/2014 Carotid U/S: RICA 40%, LICA 98%. Followed by [facility].  . CKD (chronic kidney disease), stage III (HCC)    Stage II/III  . Essential hypertension   . Fibrocystic breast   . Fibromuscular dysplasia ([place])   . Hyperglycemia   . Mitral regurgitation    a. Echo 08/2014: mild MR.  . Paroxysmal atrial fibrillation ([place])    a. Dx 07/26/2014, CHA2DS2VASc = 5-->Eliquis;  c. 08/2014 Echo: EF 55-60%, no rwma, mildly dil LA/RA, mod-sev TR, PASP 55mmHg. b. s/p DCCV 08/2014. c. back in atrial flutter 09/2014 -> rate control pursued.  . Paroxysmal atrial flutter ([place])    a. Dx 07/26/2014, CHA2DS2VASc = 5-->Eliquis;  c. 08/2014 Echo: EF 55-60%, no rwma, mildly dil LA/RA, mod-sev TR, PASP 75mmHg. b. s/p DCCV 08/2014. c. back in atrial flutter 09/2014 -> rate control pursued.  . Tricuspid regurgitation    a. Echo 08/2014: mod-severe.    Past Surgical History:  Procedure Laterality Date  . CARDIOVERSION N/A 09/01/2014   Procedure: CARDIOVERSION;  Surgeon: Josue Hector, MD;  Location: [facility] ENDOSCOPY;  Service: Cardiovascular;  Laterality: N/A;  . CARDIOVERSION N/A 11/23/2014   Procedure: CARDIOVERSION;  Surgeon: Skeet Latch, MD;  Location: [place];  Service: Cardiovascular;  Laterality: N/A;  . HEMORROIDECTOMY    . REDUCTION MAMMAPLASTY Bilateral   .  RENAL ARTERY STENT  2008   Right renal artery by Dr. Drucie Opitz  . TONSILLECTOMY    . TUBAL LIGATION  1970's     Medications: Current Meds  Medication Sig  . acetaminophen (TYLENOL) 650 MG CR tablet Take 650 mg every 8 (eight) hours as needed by mouth for pain.  Marland Kitchen amLODipine (NORVASC) 5 MG tablet Take 1.5 tablets (7.5 mg total) by mouth daily.  Marland Kitchen apixaban (ELIQUIS) 2.5 MG TABS tablet Take 1 tablet (2.5 mg total) by mouth 2 (two) times daily.  . B Complex-C (SUPER B COMPLEX PO) Take 1 tablet by mouth daily.  . carvedilol (COREG) 6.25 MG tablet Take 1 tablet (6.25 mg total) by mouth 2 (two) times daily.  . diclofenac sodium (VOLTAREN) 1 %  GEL Apply 1 application topically daily.  Marland Kitchen dofetilide (TIKOSYN) 125 MCG capsule TAKE 3 CAPSULES BY MOUTH TWICE DAILY  . furosemide (LASIX) 20 MG tablet Take 1 tablet (20 mg total) by mouth daily.  Marland Kitchen gabapentin (NEURONTIN) 100 MG capsule One capsule in the morning and 2 in the evening  . magnesium oxide (MAG-OX) 400 MG tablet Take 1 tablet (400 mg total) by mouth every morning.  . metFORMIN (GLUCOPHAGE-XR) 500 MG 24 hr tablet Take 500 mg by mouth daily.  . mirtazapine (REMERON) 15 MG tablet Take 15 mg by mouth daily.  . Multiple Vitamin (MULTIVITAMIN WITH MINERALS) TABS tablet Take 1 tablet by mouth daily.  . nitroGLYCERIN (NITROSTAT) 0.4 MG SL tablet Place 1 tablet (0.4 mg total) under the tongue every 5 (five) minutes as needed for chest pain.  . potassium chloride SA (KLOR-CON M20) 20 MEQ tablet Take 1 tablet (20 mEq total) by mouth daily.  . vitamin E 400 UNIT capsule Take 400 Units by mouth daily.  . [DISCONTINUED] NAMZARIC 28-10 MG CP24 Take 1 capsule by mouth daily.     Allergies: Allergies  Allergen Reactions  . Tagamet [Cimetidine] Hives  . Diovan [Valsartan]     Hair loss    Social History: The patient  reports that she has never smoked. She has never used smokeless tobacco. She reports current alcohol use. She reports that she does not use drugs.   Family History: The patient's family history includes Aneurysm in her father; Cancer (age of onset: 25) in her sister; Diabetes in her father and mother; Heart attack in her father; Hypertension in her father and mother; Stroke (age of onset: 83) in her mother.   Review of Systems: Please see the history of present illness.   Otherwise, the review of systems is positive for none.   All other systems are reviewed and negative.   Physical Exam: VS:  BP (!) 150/80 (BP Location: Left Arm, Patient Position: Sitting, Cuff Size: Normal)   Pulse (!) 58   Ht 5\' 2"  (1.575 m)   Wt 107 lb 1.9 oz (48.6 kg)   BMI 19.59 kg/m  .  BMI Body  mass index is 19.59 kg/m.  Wt Readings from Last 3 Encounters:  02/23/18 107 lb 1.9 oz (48.6 kg)  02/18/18 104 lb (47.2 kg)  08/17/17 105 lb 6.4 oz (47.8 kg)   BP recheck is 128/60 by me.   General: Pleasant. Thin. Alert and in no acute distress.   HEENT: Normal.  Neck: Supple, no JVD, carotid bruits, or masses noted.  Cardiac: Regular rate and rhythm. No murmurs, rubs, or gallops. No edema.  Respiratory:  Lungs are clear to auscultation bilaterally with normal work of breathing.  GI:  Soft and nontender.  MS: No deformity or atrophy. Gait and ROM intact.  Skin: Warm and dry. Color is normal.  Neuro:  Strength and sensation are intact and no gross focal deficits noted.  Psych: Alert, appropriate and with normal affect.   LABORATORY DATA:  EKG:  EKG is ordered today. This demonstrates sinus brady with 1st degree AV block - HR is 58. This is unchanged.   Lab Results  Component Value Date   WBC 3.4 (L) 02/15/2018   HGB 12.9 02/15/2018   HCT 40.5 02/15/2018   PLT 166 02/15/2018   GLUCOSE 99 02/15/2018   CHOL 179 02/16/2017   TRIG 103 02/16/2017   HDL 57 02/16/2017   LDLCALC 101 (H) 02/16/2017   ALT 12 02/15/2018   AST 22 02/15/2018   NA 143 02/15/2018   K 4.3 02/15/2018   CL 110 02/15/2018   CREATININE 1.22 (H) 02/15/2018   BUN 18 02/15/2018   CO2 27 02/15/2018   TSH 0.443 11/19/2014   INR 1.17 02/15/2018   HGBA1C 5.8 (H) 08/31/2015       BNP (last 3 results) No results for input(s): BNP in the last 8760 hours.  ProBNP (last 3 results) No results for input(s): PROBNP in the last 8760 hours.   Other Studies Reviewed Today:   Assessment/Plan:  1. PAF - on Tikosyn - she remains in chronic sinus brady with 1st degree AV block - needs follow up lab today. She has no symptoms. Trying to get assistance for the cost of her Tikosyn.    2. High risk medicine -  Lab today ok.   3. HTN- BP recheck by me is ok. No changes made today - I still worry about  compliance.   4. PAD -[facility]followed by [facility]- she did not keep her follow up - I was able to get her a renal duplex updated in 2019 -  has 60% on the right and greater than 60% on the left - her BP is ok - she does not wish to get this repeated. I think we can follow for now. Lab today to check kidney function.   5. Chronic diastolic HF- remains compensated. She denies any symptoms.    6. Atypical chest wall pain in the past - she has used Voltaren gel with relief - not endorsed today.   7. Chronic anticoagulation - she is on the lower dose of Eliquis due to age and weight.  Needs surveillance lab today.  Current medicines are reviewed with the patient today.  The patient does not have concerns regarding medicines other than what has been noted above.  The following changes have been made:  See above.  Labs/ tests ordered today include:    Orders Placed This Encounter  Procedures  . Basic metabolic panel  . CBC  . Magnesium  . EKG 12-Lead     Disposition:   FU with me in 6 months.   Patient is agreeable to this plan and will call if any problems develop in the interim.   SignedTruitt Merle, NP  02/23/2018 11:12 AM  [place] [place] [address] [place], [place]  [address] Phone: [phone] Fax: [phone]

## 2018-02-24 ENCOUNTER — Telehealth: Payer: Self-pay

## 2018-02-24 LAB — BASIC METABOLIC PANEL
BUN/Creatinine Ratio: 20 (ref 12–28)
BUN: 19 mg/dL (ref 8–27)
CO2: 23 mmol/L (ref 20–29)
Calcium: 10 mg/dL (ref 8.7–10.3)
Chloride: 107 mmol/L — ABNORMAL HIGH (ref 96–106)
Creatinine, Ser: 0.96 mg/dL (ref 0.57–1.00)
GFR calc Af Amer: 65 mL/min/{1.73_m2} (ref >59)
GFR calc non Af Amer: 56 mL/min/{1.73_m2} — ABNORMAL LOW (ref >59)
Glucose: 110 mg/dL — ABNORMAL HIGH (ref 65–99)
Potassium: 4.5 mmol/L (ref 3.5–5.2)
Sodium: 144 mmol/L (ref 134–144)

## 2018-02-24 LAB — CBC
Hematocrit: 40.3 % (ref 34.0–46.6)
Hemoglobin: 13.1 g/dL (ref 11.1–15.9)
MCH: 31.6 pg (ref 26.6–33.0)
MCHC: 32.5 g/dL (ref 31.5–35.7)
MCV: 97 fL (ref 79–97)
Platelets: 189 10*3/uL (ref 150–450)
RBC: 4.15 x10E6/uL (ref 3.77–5.28)
RDW: 12.7 % (ref 11.7–15.4)
WBC: 4.1 10*3/uL (ref 3.4–10.8)

## 2018-02-24 LAB — MAGNESIUM: Magnesium: 2.1 mg/dL (ref 1.6–2.3)

## 2018-02-24 NOTE — Telephone Encounter (Signed)
Per the pts husbands request I have done a tier exception for Dofetilide through covermymeds. Key: [id number]

## 2018-02-25 NOTE — Telephone Encounter (Signed)
Received fax from [facility] stating tier reduction for DOFETILIDE denied, I have appealed and faxed medical notes.

## 2018-03-02 NOTE — Telephone Encounter (Signed)
**Note De-Identified Dredyn Gubbels Obfuscation** Letter received from [facility] [employer] [place] fax stating that they have approved the pts Dofetilide tier exception. Approval good from 01/06/2018 until 01/06/2019.  Per the letter Dofetilide is now at a tier 1 for this pt.  I have notified the pts pharmacy.

## 2018-03-03 DIAGNOSIS — G4489 Other headache syndrome: Secondary | ICD-10-CM | POA: Diagnosis not present

## 2018-03-13 ENCOUNTER — Other Ambulatory Visit: Payer: Self-pay | Admitting: Nurse Practitioner

## 2018-03-22 ENCOUNTER — Other Ambulatory Visit: Payer: Self-pay | Admitting: Nurse Practitioner

## 2018-04-06 ENCOUNTER — Encounter (HOSPITAL_COMMUNITY): Payer: Medicare Other

## 2018-04-06 ENCOUNTER — Other Ambulatory Visit: Payer: Self-pay | Admitting: Nurse Practitioner

## 2018-05-06 ENCOUNTER — Encounter (HOSPITAL_COMMUNITY): Payer: Medicare Other

## 2018-05-18 ENCOUNTER — Telehealth: Payer: Self-pay

## 2018-05-18 NOTE — Telephone Encounter (Signed)
Spoke with the patient's husband(DPR verified) and he stated that they didn't have to the access to do a doxy.me visit. He did give verbal consent to do a telephone visit and to file her insurance.

## 2018-05-19 ENCOUNTER — Encounter: Payer: Self-pay | Admitting: Neurology

## 2018-05-19 ENCOUNTER — Other Ambulatory Visit: Payer: Self-pay

## 2018-05-19 ENCOUNTER — Ambulatory Visit (INDEPENDENT_AMBULATORY_CARE_PROVIDER_SITE_OTHER): Payer: Medicare Other | Admitting: Neurology

## 2018-05-19 DIAGNOSIS — R51 Headache: Secondary | ICD-10-CM

## 2018-05-19 DIAGNOSIS — R519 Headache, unspecified: Secondary | ICD-10-CM

## 2018-05-19 MED ORDER — GABAPENTIN 100 MG PO CAPS: ORAL_CAPSULE | ORAL | 1 refills | Status: DC

## 2018-05-19 NOTE — Progress Notes (Signed)
I have read the note, and I agree with the clinical assessment and plan.  Charles K Willis   

## 2018-05-19 NOTE — Progress Notes (Signed)
Virtual Visit via Telephone Note  I connected with LUXE CUADROS on 05/19/18 at  8:45 AM EDT by telephone and verified that I am speaking with the correct person using two identifiers.   I discussed the limitations, risks, security and privacy concerns of performing an evaluation and management service by telephone and the availability of in person appointments. I also discussed with the patient that there may be a patient responsible charge related to this service. The patient expressed understanding and agreed to proceed.   History of Present Illness: 05/19/2018 SS: Ms. Delpriore is a 83 year old female with history of headache.  Laboratory evaluation February 2020 was unremarkable.  She had x-ray of the cervical spine showing multilevel degenerative changes.  She is currently taking gabapentin 100 mg in the morning and 200 mg at bedtime.  She reports she is no longer having any headaches.  She says she is doing great.  She reports the medication has fixed the problem.  She says she is tolerating gabapentin well and it is very beneficial.  She denies any new problems or concerns.  She would like to continue taking gabapentin.  She says her daughter manages her medications, fills her pillboxes weekly.  02/18/2018 Dr. Jannifer Franklin: Ms. Zappia is an 83 year old right-handed black female with a history of headaches that began almost a year ago, she had a CT scan of the brain in March 2019 because of headaches.  Over time, the headaches have become more frequent, she is now having headaches about every other day.  The patient has a history of a memory disturbance, she has been on Namzeric, she comes into the office today with her husband who mainly gives the history.  The patient has been taken off of Namzeric as it was feared that the medication was causing the headaches, but the patient has been on this medication for a number of years.  The patient has had some weight loss on Namzeric recently.  The patient  reports that the headaches are in the occipital areas bilaterally, she does have some discomfort into the upper neck areas as well.  The headaches are a dull achy pain, they are never disabling, not associated with nausea or vomiting or photophobia or phonophobia.  She denies any severe neck stiffness or pain into the shoulders or down the arms.  The patient reports no numbness or weakness of the face, arms, legs.  She denies any balance issues or difficulty controlling the bowels or the bladder.  The husband does not relate any other medication adjustments or new medications that correlate with the onset of the headache.  The headaches tend to be more significant in the morning.  The patient takes Tylenol for her arthritis and this seems to help the headache as well.  The patient is on anticoagulant therapy.  A recent CT scan of the brain was done, this was unremarkable   Observations/Objective: Telephone call, alert and oriented, her and her husband provide the history  I was not able to review her medications, her daughter manages the medication, was not available for the visit  Assessment and Plan: 1.  Occipital headache  Her headaches have completely resolved.  She will continue taking gabapentin 100 mg in the morning, 200 mg at bedtime.  I will send in a refill of the medication.  She indicates she is doing very well.  She will follow-up in the office in 6 months for revisit.  If she continues to do well, she  can follow-up in the office on an as-needed basis if she is agreeable.  Follow Up Instructions: I made the patient an appointment for 6 months from now on November 22 2018 at 915   I discussed the assessment and treatment plan with the patient. The patient was provided an opportunity to ask questions and all were answered. The patient agreed with the plan and demonstrated an understanding of the instructions.   The patient was advised to call back or seek an in-person evaluation if the  symptoms worsen or if the condition fails to improve as anticipated.  I provided 15 minutes of non-face-to-face time during this encounter.   Evangeline Dakin, DNP  [facility] [address] [place], [place] [address] [phone]

## 2018-06-03 ENCOUNTER — Ambulatory Visit (HOSPITAL_BASED_OUTPATIENT_CLINIC_OR_DEPARTMENT_OTHER)
Admission: RE | Admit: 2018-06-03 | Discharge: 2018-06-03 | Disposition: A | Discharge status: Home / Self Care | Payer: Medicare Other | Source: Ambulatory Visit | Attending: Nurse Practitioner | Admitting: Nurse Practitioner

## 2018-06-03 ENCOUNTER — Other Ambulatory Visit (HOSPITAL_COMMUNITY): Payer: Self-pay | Admitting: Nurse Practitioner

## 2018-06-03 ENCOUNTER — Ambulatory Visit (HOSPITAL_COMMUNITY)
Admission: RE | Admit: 2018-06-03 | Discharge: 2018-06-03 | Disposition: A | Discharge status: Home / Self Care | Payer: Medicare Other | Source: Ambulatory Visit | Attending: Nurse Practitioner | Admitting: Nurse Practitioner

## 2018-06-03 ENCOUNTER — Other Ambulatory Visit: Payer: Self-pay

## 2018-06-03 DIAGNOSIS — I6523 Occlusion and stenosis of bilateral carotid arteries: Secondary | ICD-10-CM

## 2018-06-03 DIAGNOSIS — I701 Atherosclerosis of renal artery: Secondary | ICD-10-CM

## 2018-08-03 DIAGNOSIS — I10 Essential (primary) hypertension: Secondary | ICD-10-CM | POA: Diagnosis not present

## 2018-08-03 DIAGNOSIS — F341 Dysthymic disorder: Secondary | ICD-10-CM | POA: Diagnosis not present

## 2018-08-03 DIAGNOSIS — R410 Disorientation, unspecified: Secondary | ICD-10-CM | POA: Diagnosis not present

## 2018-08-03 DIAGNOSIS — E559 Vitamin D deficiency, unspecified: Secondary | ICD-10-CM | POA: Diagnosis not present

## 2018-08-03 DIAGNOSIS — I509 Heart failure, unspecified: Secondary | ICD-10-CM | POA: Diagnosis not present

## 2018-08-03 DIAGNOSIS — I4891 Unspecified atrial fibrillation: Secondary | ICD-10-CM | POA: Diagnosis not present

## 2018-08-03 DIAGNOSIS — E119 Type 2 diabetes mellitus without complications: Secondary | ICD-10-CM | POA: Diagnosis not present

## 2018-08-03 DIAGNOSIS — D6869 Other thrombophilia: Secondary | ICD-10-CM | POA: Diagnosis not present

## 2018-08-03 DIAGNOSIS — F0390 Unspecified dementia without behavioral disturbance: Secondary | ICD-10-CM | POA: Diagnosis not present

## 2018-08-14 NOTE — Progress Notes (Signed)
CARDIOLOGY OFFICE NOTE  Date:  08/16/2018    Donna [name] Date of Birth: 1935-08-09 Medical Record #[id number]  PCP:  Josetta Huddle, MD  Cardiologist:  Jennings Books   Chief Complaint  Patient presents with  . Follow-up    6 month check - seen for Dr. Tamala Julian    History of Present Illness: Donna [name] is a 83 y.o. female who presents today for a 6 month check. Seen for Dr. Tamala Julian.   She has a history of atherosclerotic kidney disease, hypertension, paroxysmal atrial fibrillation, chronic diastolic heart failure when in A. fib, and chronic anticoagulation therapy. She is on chronic dofetilide andapixaban therapy.  Last seen by Dr. Huntley Estelle of 2017. Was doing ok at that visit.Then lost to follow up until February of 2019 - then seen by me - medicines very unclear and this has been a recurrent theme. I have also suspected medication non compliance as well.   Last seen in February - again medicines were unclear but was doing well from our standpoint.   The patient does not have symptoms concerning for COVID-19 infection (fever, chills, cough, or new shortness of breath).   Comes in today. Here with her husband. Seems to be doing ok. She has gained some weight - says from eating more and being more homebound. No chest pain. Not dizzy. No passing out. She feels ok. BP looks ok. Recent labs noted.   Past Medical History:  Diagnosis Date  . Atherosclerosis of renal artery (HCC)    a. s/p R RA stenting;  b. 04/2014 Renal Art duplex: Patent R RA stent, <60% L RA stenosis. Followed by [facility].  . Carotid arterial disease ([place])    a. 01/2014 Carotid U/S: RICA 64%, LICA 40%. Followed by [facility].  . CKD (chronic kidney disease), stage III (HCC)    Stage II/III  . Essential hypertension   . Fibrocystic breast   . Fibromuscular dysplasia ([place])   . Hyperglycemia   . Mitral regurgitation    a. Echo 08/2014: mild MR.  . Paroxysmal atrial fibrillation ([place])    a. Dx 07/26/2014,  CHA2DS2VASc = 5-->Eliquis;  c. 08/2014 Echo: EF 55-60%, no rwma, mildly dil LA/RA, mod-sev TR, PASP 66mmHg. b. s/p DCCV 08/2014. c. back in atrial flutter 09/2014 -> rate control pursued.  . Paroxysmal atrial flutter ([place])    a. Dx 07/26/2014, CHA2DS2VASc = 5-->Eliquis;  c. 08/2014 Echo: EF 55-60%, no rwma, mildly dil LA/RA, mod-sev TR, PASP 72mmHg. b. s/p DCCV 08/2014. c. back in atrial flutter 09/2014 -> rate control pursued.  . Tricuspid regurgitation    a. Echo 08/2014: mod-severe.    Past Surgical History:  Procedure Laterality Date  . CARDIOVERSION N/A 09/01/2014   Procedure: CARDIOVERSION;  Surgeon: Josue Hector, MD;  [facility] ENDOSCOPY;  Service: Cardiovascular;  Laterality: N/A;  . CARDIOVERSION N/A 11/23/2014   Procedure: CARDIOVERSION;  Surgeon: Skeet Latch, MD;  Location: [place];  Service: Cardiovascular;  Laterality: N/A;  . HEMORROIDECTOMY    . REDUCTION MAMMAPLASTY Bilateral   . RENAL ARTERY STENT  2008   Right renal artery by Dr. Drucie Opitz  . TONSILLECTOMY    . TUBAL LIGATION  1970's     Medications: Current Meds  Medication Sig  . acetaminophen (TYLENOL) 650 MG CR tablet Take 650 mg every 8 (eight) hours as needed by mouth for pain.  Marland Kitchen amLODipine (NORVASC) 5 MG tablet Take 1.5 tablets (7.5 mg total) by mouth daily.  Marland Kitchen  apixaban (ELIQUIS) 2.5 MG TABS tablet Take 1 tablet (2.5 mg total) by mouth 2 (two) times daily.  . diclofenac sodium (VOLTAREN) 1 % GEL Apply 1 application topically daily.  Marland Kitchen dofetilide (TIKOSYN) 125 MCG capsule TAKE 3 CAPSULES BY MOUTH TWICE DAILY  . furosemide (LASIX) 20 MG tablet Take 1 tablet by mouth once daily  . gabapentin (NEURONTIN) 100 MG capsule One capsule in the morning and 2 in the evening  . magnesium oxide (MAG-OX) 400 MG tablet TAKE 1 TABLET BY MOUTH IN THE MORNING  . metFORMIN (GLUCOPHAGE-XR) 500 MG 24 hr tablet Take 500 mg by mouth daily.  . mirtazapine (REMERON) 15 MG tablet Take 15 mg by mouth daily.  . Multiple Vitamin  (MULTIVITAMIN WITH MINERALS) TABS tablet Take 1 tablet by mouth daily.  . nitroGLYCERIN (NITROSTAT) 0.4 MG SL tablet Place 1 tablet (0.4 mg total) under the tongue every 5 (five) minutes as needed for chest pain.  . potassium chloride SA (KLOR-CON M20) 20 MEQ tablet Take 1 tablet (20 mEq total) by mouth daily.  . vitamin E 400 UNIT capsule Take 400 Units by mouth daily.  . [DISCONTINUED] B Complex-C (SUPER B COMPLEX PO) Take 1 tablet by mouth daily.  . [DISCONTINUED] carvedilol (COREG) 6.25 MG tablet TAKE 1 TABLET BY MOUTH TWICE DAILY  . [DISCONTINUED] dofetilide (TIKOSYN) 125 MCG capsule TAKE 3 CAPSULES BY MOUTH TWICE DAILY     Allergies: Allergies  Allergen Reactions  . Tagamet [Cimetidine] Hives  . Diovan [Valsartan]     Hair loss    Social History: The patient  reports that she has never smoked. She has never used smokeless tobacco. She reports current alcohol use. She reports that she does not use drugs.   Family History: The patient's family history includes Aneurysm in her father; Cancer (age of onset: 31) in her sister; Diabetes in her father and mother; Heart attack in her father; Hypertension in her father and mother; Stroke (age of onset: 11) in her mother.   Review of Systems: Please see the history of present illness.   All other systems are reviewed and negative.   Physical Exam: VS:  BP 138/70 (BP Location: Left Arm, Patient Position: Sitting, Cuff Size: Normal)   Pulse 65   Wt 115 lb 12.8 oz (52.5 kg)   BMI 21.18 kg/m  .  BMI Body mass index is 21.18 kg/m.  Wt Readings from Last 3 Encounters:  08/16/18 115 lb 12.8 oz (52.5 kg)  02/23/18 107 lb 1.9 oz (48.6 kg)  02/18/18 104 lb (47.2 kg)    General: Pleasant. Alert and in no acute distress.  She has gained weight.  HEENT: Normal.  Neck: Supple, no JVD, carotid bruits, or masses noted.  Cardiac: Regular rate and rhythm. No murmurs, rubs, or gallops. No edema.  Respiratory:  Lungs are clear to auscultation  bilaterally with normal work of breathing.  GI: Soft and nontender.  MS: No deformity or atrophy. Gait and ROM intact.  Skin: Warm and dry. Color is normal.  Neuro:  Strength and sensation are intact and no gross focal deficits noted.  Psych: Alert, appropriate and with normal affect.   LABORATORY DATA:  EKG:  EKG is ordered today. This demonstrates NSR with prolonged 1st degree AV block.   Lab Results  Component Value Date   WBC 4.1 02/23/2018   HGB 13.1 02/23/2018   HCT 40.3 02/23/2018   PLT 189 02/23/2018   GLUCOSE 110 (H) 02/23/2018   CHOL 179 02/16/2017  TRIG 103 02/16/2017   HDL 57 02/16/2017   LDLCALC 101 (H) 02/16/2017   ALT 12 02/15/2018   AST 22 02/15/2018   NA 144 02/23/2018   K 4.5 02/23/2018   CL 107 (H) 02/23/2018   CREATININE 0.96 02/23/2018   BUN 19 02/23/2018   CO2 23 02/23/2018   TSH 0.443 11/19/2014   INR 1.17 02/15/2018   HGBA1C 5.8 (H) 08/31/2015       BNP (last 3 results) No results for input(s): BNP in the last 8760 hours.  ProBNP (last 3 results) No results for input(s): PROBNP in the last 8760 hours.   Other Studies Reviewed Today:  Renal Artery Duplex 05/2018:   Right: Normal size right kidney. Normal right Resistive Index.        Abnormal cortical thickness of right kidney. Evidence of a        greater than 60% stenosis of the right renal artery,        essentially stable when compared to the previous exam. RRV        flow present. Cyst noted in the upper pole, measuring 2.0 x        1.6 x 1.6 cm. Left:  Normal size of left kidney. Abnormal left Resistive Index.        Abnormal cortical thickness of the left kidney. Evidence of a        > 60% stenosis in the left renal artery, essentially stable        when compared to the previous exam. LRV flow present. Mesenteric: Normal Celiac artery and Superior Mesenteric artery findings.   Patent IVC.   Suggest follow up study in 12 months.   Electronically signed by Larae Grooms MD on 06/03/2018 at 6:20:48 PM.   Carotid Doppler Summary 05/2018: Right Carotid: Velocities in the right ICA are consistent with a 1-39% stenosis.                The ECA appears <50% stenosed.  Left Carotid: Velocities in the left ICA are consistent with a 1-39% stenosis.               The ECA appears <50% stenosed.  Vertebrals:  Bilateral vertebral arteries demonstrate antegrade flow. Subclavians: Normal flow hemodynamics were seen in bilateral subclavian              arteries.  Suggest follow up study in 12 months.  Electronically signed by Larae Grooms MD on 06/03/2018 at 6:19:30 PM.  Myoview Study Highlights 2016   There was no ST segment deviation noted during stress.  This is a low risk study.   Perfusion imaging shows no evidence of ischemia or scar. Study was not gated because of the atrial fibrillation. Consider echocardiogram to assess LV systolic function if needed.   Echo Study Conclusions 2016  - Left ventricle: The cavity size was normal. Wall thickness was   increased in a pattern of mild LVH. Systolic function was normal.   The estimated ejection fraction was in the range of 55% to 60%.   Wall motion was normal; there were no regional wall motion   abnormalities. - Mitral valve: There was mild regurgitation. - Left atrium: The atrium was mildly dilated. - Right atrium: The atrium was mildly dilated. - Tricuspid valve: There was moderate-severe regurgitation. - Pulmonary arteries: Systolic pressure was mildly increased. PA   peak pressure: 39 mm Hg (S). - Pericardium, extracardiac: A trivial pericardial effusion was   identified.    Assessment/Plan:  1. PAF - on Tikosyn -she remains in chronic sinus brady with 1st degree AV block -  She has no symptoms. Discussed with EP today - will cut Coreg back to 3.125 mg BID - may need to stop altogether - her QT is currently fine.   2. High risk medicine- Recent labs noted - will try to  get paper copy to see what her potassium was. Will need lab on return visit. I still worry about overall compliance.   3. HTN-BP looks good - no changes made today.   4. PAD -[facility]follow[facility] by [facility]- recent studies from May noted - repeat in one year.   5. Chronic diastolic HF-remains compensated. She denies any symptoms.Weight seems to be up more due to calorie intake.   6. Chronic anticoagulation - she is on the lower dose of Eliquis due to age and weight.   8. Valvular heart disease - last echo noted - no symptoms of CHF noted - would favor conservative management.   9. COVID-19 Education: The signs and symptoms of COVID-19 were discussed with the patient and how to seek care for testing (follow up with PCP or arrange E-visit).  The importance of social distancing, staying at home, hand hygiene and wearing a mask when out in public were discussed today.  Current medicines are reviewed with the patient today.  The patient does not have concerns regarding medicines other than what has been noted above.  The following changes have been made:  See above.  Labs/ tests ordered today include:    Orders Placed This Encounter  Procedures  . EKG 12-Lead     Disposition:   FU with me in 3 months with EKG.   Patient is agreeable to this plan and will call if any problems develop in the interim.   SignedTruitt Merle, NP  08/16/2018 11:17 AM  [place] [address] [place], [place]  [address] Phone: [phone] Fax: [phone]

## 2018-08-16 ENCOUNTER — Ambulatory Visit (INDEPENDENT_AMBULATORY_CARE_PROVIDER_SITE_OTHER): Payer: Medicare Other | Admitting: Nurse Practitioner

## 2018-08-16 ENCOUNTER — Telehealth: Payer: Self-pay | Admitting: *Deleted

## 2018-08-16 ENCOUNTER — Other Ambulatory Visit: Payer: Self-pay

## 2018-08-16 ENCOUNTER — Encounter: Payer: Self-pay | Admitting: Nurse Practitioner

## 2018-08-16 VITALS — BP 138/70 | HR 65 | Wt 115.8 lb

## 2018-08-16 DIAGNOSIS — Z7901 Long term (current) use of anticoagulants: Secondary | ICD-10-CM

## 2018-08-16 DIAGNOSIS — Z79899 Other long term (current) drug therapy: Secondary | ICD-10-CM | POA: Diagnosis not present

## 2018-08-16 DIAGNOSIS — I701 Atherosclerosis of renal artery: Secondary | ICD-10-CM | POA: Diagnosis not present

## 2018-08-16 DIAGNOSIS — I480 Paroxysmal atrial fibrillation: Secondary | ICD-10-CM | POA: Diagnosis not present

## 2018-08-16 DIAGNOSIS — I4819 Other persistent atrial fibrillation: Secondary | ICD-10-CM

## 2018-08-16 DIAGNOSIS — I440 Atrioventricular block, first degree: Secondary | ICD-10-CM | POA: Diagnosis not present

## 2018-08-16 MED ORDER — CARVEDILOL 3.125 MG PO TABS: 3.1250 mg | ORAL_TABLET | Freq: Two times a day (BID) | ORAL | 3 refills | Status: DC

## 2018-08-16 MED ORDER — DOFETILIDE 125 MCG PO CAPS: ORAL_CAPSULE | ORAL | 2 refills | Status: DC

## 2018-08-16 NOTE — Patient Instructions (Addendum)
After Visit Summary:  We will be checking the following labs today - NONE  We will call Dr. Felipa Eth and get a copy of your recent lab for me to look at  Medication Instructions:    Continue with your current medicines. BUT  Decrease Coreg back to 3.125 mg twice a day   If you need a refill on your cardiac medications before your next appointment, please call your pharmacy.     Testing/Procedures To Be Arranged:  N/A  Follow-Up:   See me in 3 months with repeat EKG    At [facility], you and your health needs are our priority.  As part of our continuing mission to provide you with exceptional heart care, we have created designated Provider Care Teams.  These Care Teams include your primary Cardiologist (physician) and Advanced Practice Providers (APPs -  Physician Assistants and Nurse Practitioners) who all work together to provide you with the care you need, when you need it.  Special Instructions:  . Stay safe, stay home, wash your hands for at least 20 seconds and wear a mask when out in public.  . It was good to talk with you today.    Call the [place] office at [phone] if you have any questions, problems or concerns.

## 2018-08-16 NOTE — Telephone Encounter (Signed)
Called Dr.Stoneking's office @ [phone] to get pt's recent labs faxed to office. Will fax to office.

## 2018-10-21 DIAGNOSIS — T07XXXA Unspecified multiple injuries, initial encounter: Secondary | ICD-10-CM | POA: Diagnosis not present

## 2018-10-21 DIAGNOSIS — I4891 Unspecified atrial fibrillation: Secondary | ICD-10-CM | POA: Diagnosis not present

## 2018-10-21 DIAGNOSIS — F0390 Unspecified dementia without behavioral disturbance: Secondary | ICD-10-CM | POA: Diagnosis not present

## 2018-10-21 DIAGNOSIS — R410 Disorientation, unspecified: Secondary | ICD-10-CM | POA: Diagnosis not present

## 2018-10-21 DIAGNOSIS — I509 Heart failure, unspecified: Secondary | ICD-10-CM | POA: Diagnosis not present

## 2018-10-21 DIAGNOSIS — D6869 Other thrombophilia: Secondary | ICD-10-CM | POA: Diagnosis not present

## 2018-10-21 DIAGNOSIS — Z23 Encounter for immunization: Secondary | ICD-10-CM | POA: Diagnosis not present

## 2018-10-21 IMAGING — MG DIGITAL SCREENING BILATERAL MAMMOGRAM WITH TOMO AND CAD
8 series · 9 of 24 positions shown · non-contrast
Comparison: Previous exam(s).

CLINICAL DATA: Screening.

EXAM:
DIGITAL SCREENING BILATERAL MAMMOGRAM WITH TOMO AND CAD

[L CC synth-2D]
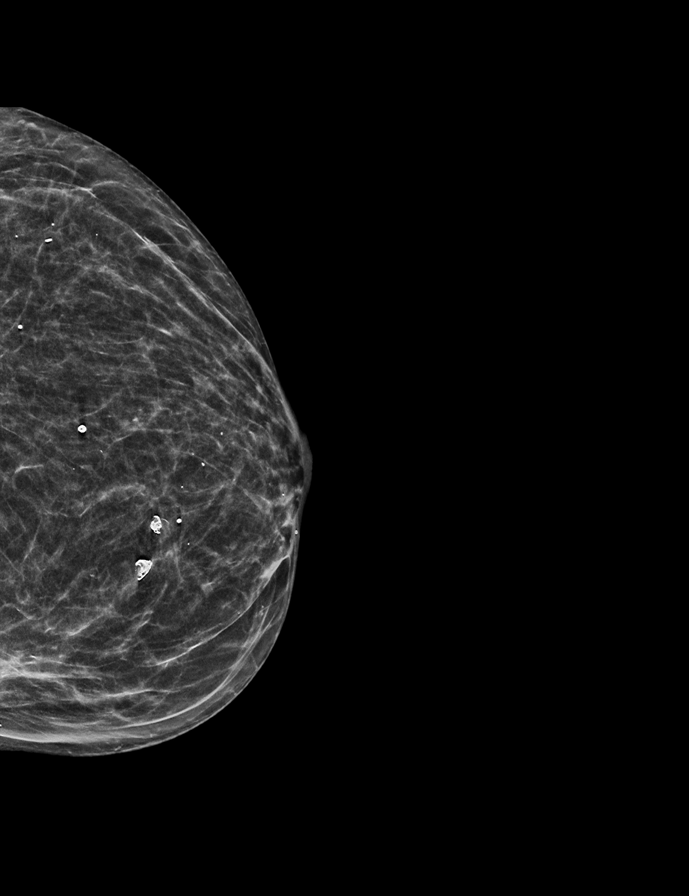

[L MLO synth-2D]
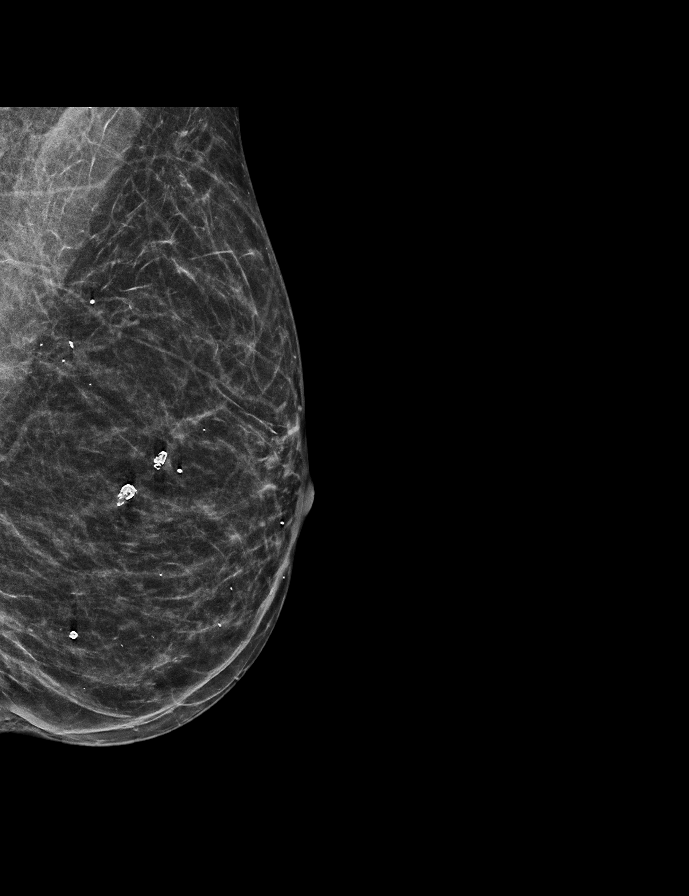

[R CC synth-2D]
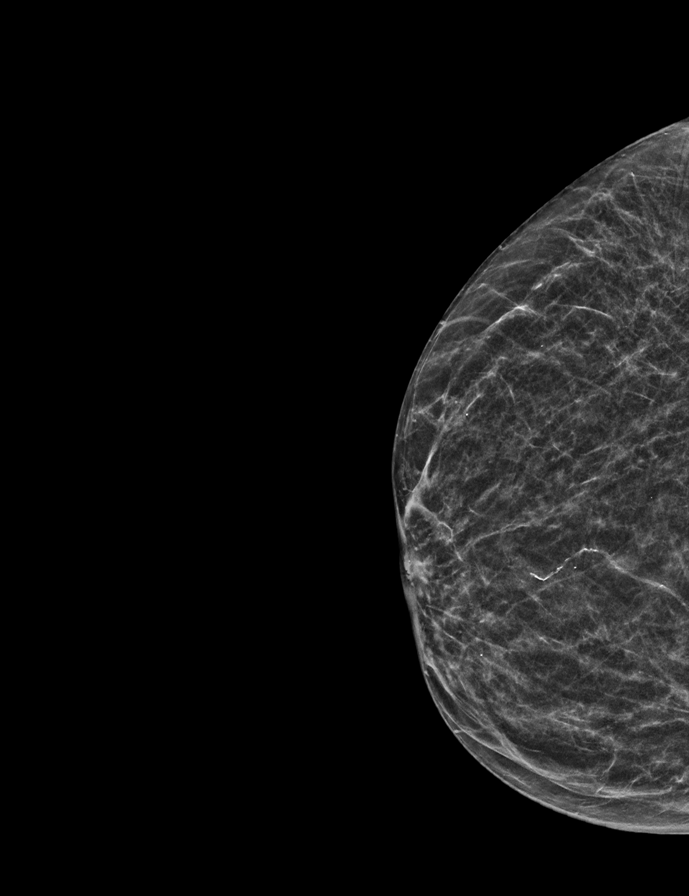

[R MLO synth-2D]
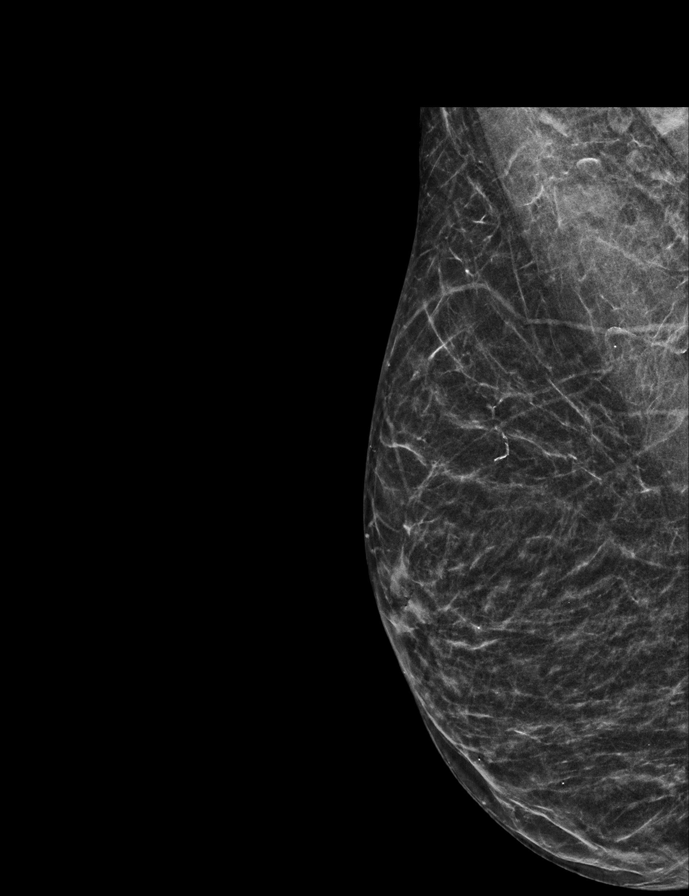

[L CC tomo · 2 of 45 frames shown]
[frame 15/45]
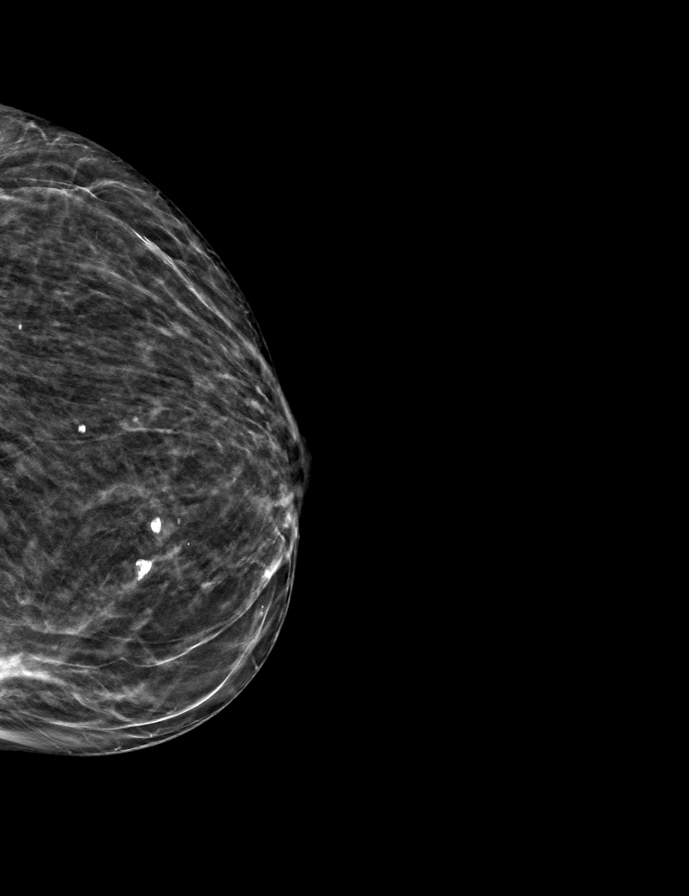
[frame 23/45]
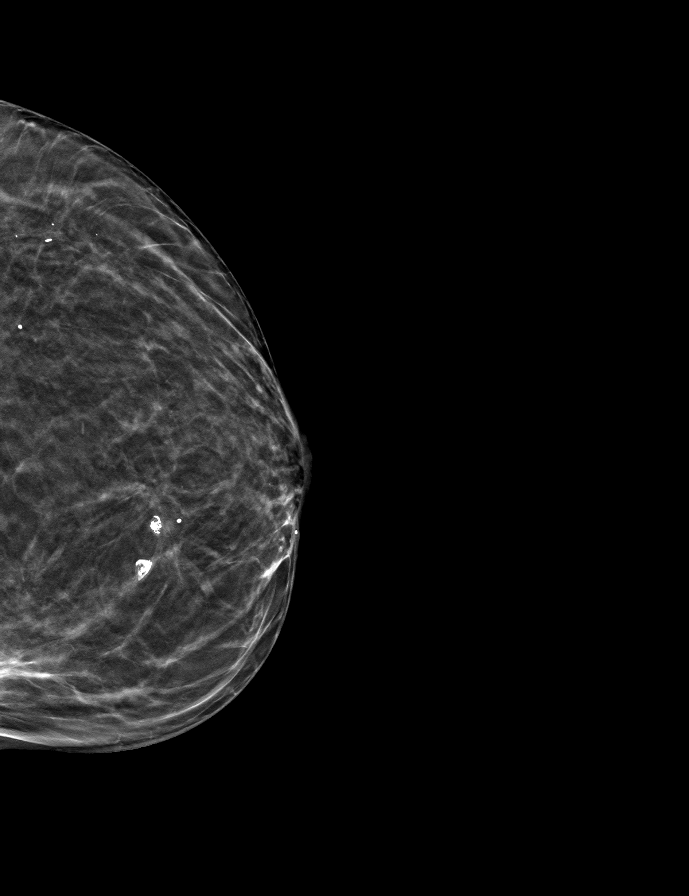

[R CC tomo · tomo slice 22/43.0]
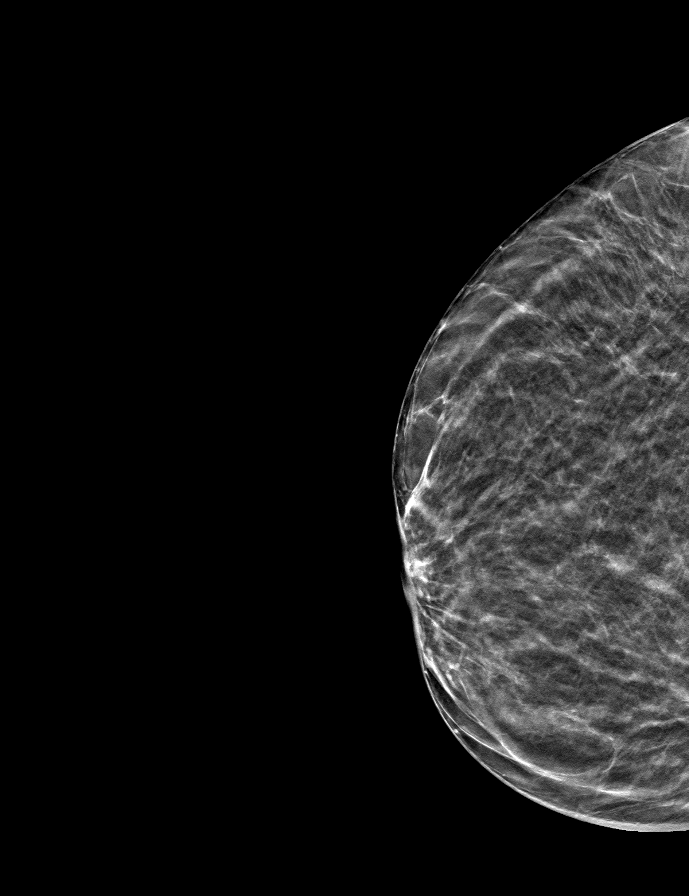

[L MLO tomo · tomo slice 24/47.0]
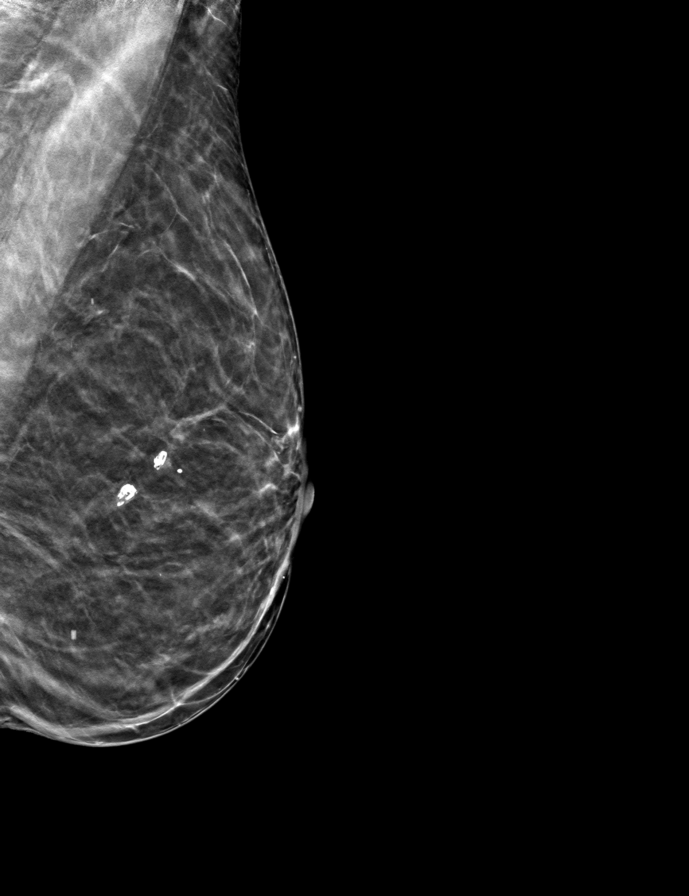

[R MLO tomo · tomo slice 23/45.0]
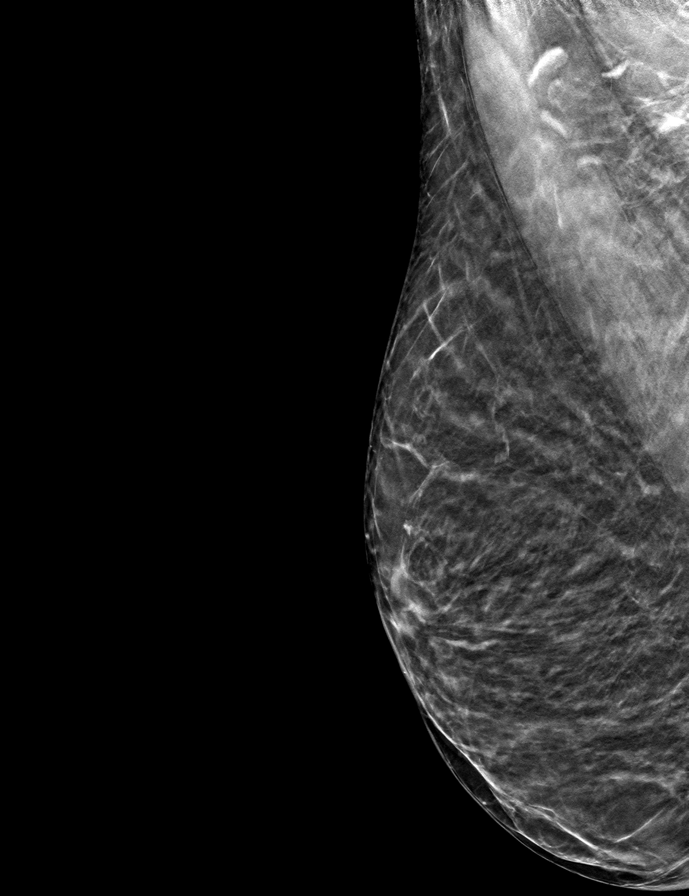

[9 of 24 positions shown; findings below may reference images not displayed]

ACR Breast Density Category b: There are scattered areas of
fibroglandular density.
FINDINGS: There are no findings suspicious for malignancy. Images were
processed with CAD.
IMPRESSION: No mammographic evidence of malignancy. A result letter of this
screening mammogram will be mailed directly to the patient.

RECOMMENDATION:
Screening mammogram in one year. (Code:[id number])

BI-RADS CATEGORY  1: Negative.

## 2018-10-22 DIAGNOSIS — I10 Essential (primary) hypertension: Secondary | ICD-10-CM | POA: Diagnosis not present

## 2018-10-22 DIAGNOSIS — I509 Heart failure, unspecified: Secondary | ICD-10-CM | POA: Diagnosis not present

## 2018-10-22 DIAGNOSIS — I4891 Unspecified atrial fibrillation: Secondary | ICD-10-CM | POA: Diagnosis not present

## 2018-10-22 DIAGNOSIS — F341 Dysthymic disorder: Secondary | ICD-10-CM | POA: Diagnosis not present

## 2018-10-22 DIAGNOSIS — I480 Paroxysmal atrial fibrillation: Secondary | ICD-10-CM | POA: Diagnosis not present

## 2018-10-22 DIAGNOSIS — E782 Mixed hyperlipidemia: Secondary | ICD-10-CM | POA: Diagnosis not present

## 2018-10-22 DIAGNOSIS — I701 Atherosclerosis of renal artery: Secondary | ICD-10-CM | POA: Diagnosis not present

## 2018-10-22 DIAGNOSIS — E119 Type 2 diabetes mellitus without complications: Secondary | ICD-10-CM | POA: Diagnosis not present

## 2018-10-22 DIAGNOSIS — F0390 Unspecified dementia without behavioral disturbance: Secondary | ICD-10-CM | POA: Diagnosis not present

## 2018-11-18 NOTE — Progress Notes (Signed)
CARDIOLOGY OFFICE NOTE  Date:  11/23/2018    Donna [name] Date of Birth: 1935/03/23 Medical Record [id number]  PCP:  Donna Huddle, MD  Cardiologist:  Donna [name]  Chief Complaint  Patient presents with  . Follow-up    Seen for Donna [name] [name]    History of Present Illness: Donna [name] is a 83 y.o. female who presents today for a follow up visit.  Seen for Donna [name] [name].   She has a history of atherosclerotic kidney disease, hypertension, paroxysmal atrial fibrillation, chronic diastolic heart failure when in A. fib, and chronic anticoagulation therapy.She is on chronic dofetilide andapixaban therapy.  Last seen by Dr. Huntley [name] of 2017. Was doing ok at that visit.Then lost to follow up until February of2019- thenseen by me - medicines very unclear and this has been a recurrent theme. I have also suspected medication non compliance as well.   When seen in February - again medicines were unclear but was doing well from our standpoint. I last saw her in August - was doing ok - weight was up - feeling ok. Had new 1st degree AV block - we cut her Coreg back after discussion with EP.   The patient does not have symptoms concerning for COVID-19 infection (fever, chills, cough, or new shortness of breath).   Comes in today. Here with husband and their daughter today Donna [name]. She comes to bring papers that include living will and health care POA and is asking what the diagnoses actually are for her mother. [name] says she is doing ok. Feels good. Weight is up - says she is eating more. ? Some salt. Not short of breath. Not dizzy or lightheaded. No chest pain. No swelling. Donna [name] is asking about PA for the Tikosyn - will send message to [employer], RN.   Past Medical History:  Diagnosis Date  . Atherosclerosis of renal artery (HCC)    a. s/p R RA stenting;  b. 04/2014 Renal Art duplex: Patent R RA stent, <60% L RA stenosis. Followed by [facility].  . Carotid arterial  disease ([place])    a. 01/2014 Carotid U/S: RICA [id number], LICA AB-[id number]. Followed by [facility].  . CKD (chronic kidney disease), stage III    Stage II/III  . Essential hypertension   . Fibrocystic breast   . Fibromuscular dysplasia ([place])   . Hyperglycemia   . Mitral regurgitation    a. Echo 08/2014: mild MR.  . Paroxysmal atrial fibrillation ([place])    a. Dx 07/26/2014, CHA2DS2VASc = 5-->Eliquis;  c. 08/2014 Echo: EF 55-60%, no rwma, mildly dil LA/RA, mod-sev TR, PASP 19mmHg. b. s/p DCCV 08/2014. c. back in atrial flutter 09/2014 -> rate control pursued.  . Paroxysmal atrial flutter ([place])    a. Dx 07/26/2014, CHA2DS2VASc = 5-->Eliquis;  c. 08/2014 Echo: EF 55-60%, no rwma, mildly dil LA/RA, mod-sev TR, PASP 68mmHg. b. s/p DCCV 08/2014. c. back in atrial flutter 09/2014 -> rate control pursued.  . Tricuspid regurgitation    a. Echo 08/2014: mod-severe.    Past Surgical History:  Procedure Laterality Date  . CARDIOVERSION N/A 09/01/2014   Procedure: CARDIOVERSION;  Surgeon: Donna Hector, MD;  Location: [facility] ENDOSCOPY;  Service: Cardiovascular;  Laterality: N/A;  . CARDIOVERSION N/A 11/23/2014   Procedure: CARDIOVERSION;  Surgeon: Donna Latch, MD;  Location: [facility];  Service: Cardiovascular;  Laterality: N/A;  . HEMORROIDECTOMY    . REDUCTION MAMMAPLASTY Bilateral   . RENAL ARTERY STENT  2008  Right renal artery by Dr. Drucie [name]  . TONSILLECTOMY    . TUBAL LIGATION  1970's     Medications: Current Meds  Medication Sig  . acetaminophen (TYLENOL) 650 MG CR tablet Take 650 mg every 8 (eight) hours as needed by mouth for pain.  Marland Kitchen apixaban (ELIQUIS) 2.5 MG TABS tablet Take 1 tablet (2.5 mg total) by mouth 2 (two) times daily.  . diclofenac sodium (VOLTAREN) 1 % GEL Apply 1 application topically daily.  Marland Kitchen dofetilide (TIKOSYN) 125 MCG capsule TAKE 3 CAPSULES BY MOUTH TWICE DAILY  . furosemide (LASIX) 20 MG tablet Take 1 tablet by mouth once daily  . gabapentin (NEURONTIN) 100 MG capsule One capsule in  the morning and 2 in the evening  . magnesium oxide (MAG-OX) 400 MG tablet TAKE 1 TABLET BY MOUTH IN THE MORNING  . metFORMIN (GLUCOPHAGE-XR) 500 MG 24 hr tablet Take 500 mg by mouth daily.  . mirtazapine (REMERON) 15 MG tablet Take 15 mg by mouth daily.  . Multiple Vitamin (MULTIVITAMIN WITH MINERALS) TABS tablet Take 1 tablet by mouth daily.  . nitroGLYCERIN (NITROSTAT) 0.4 MG SL tablet Place 1 tablet (0.4 mg total) under the tongue every 5 (five) minutes as needed for chest pain.  . potassium chloride SA (KLOR-CON M20) 20 MEQ tablet Take 1 tablet (20 mEq total) by mouth daily.  . vitamin E 400 UNIT capsule Take 400 Units by mouth daily.  . [DISCONTINUED] carvedilol (COREG) 3.125 MG tablet Take 1 tablet (3.125 mg total) by mouth 2 (two) times daily with a meal.     Allergies: Allergies  Allergen Reactions  . Tagamet [Cimetidine] Hives  . Diovan [Valsartan]     Hair loss    Social History: The patient  reports that she has never smoked. She has never used smokeless tobacco. She reports current alcohol use. She reports that she does not use drugs.   Family History: The patient's family history includes Aneurysm in her father; Cancer (age of onset: 70) in her sister; Diabetes in her father and mother; Heart attack in her father; Hypertension in her father and mother; Stroke (age of onset: 34) in her mother.   Review of Systems: Please see the history of present illness.   All other systems are reviewed and negative.   Physical Exam: VS:  BP (!) 142/80   Pulse 70   Ht 5\' 2"  (1.575 m)   Wt 124 lb (56.2 kg)   BMI 22.68 kg/m  .  BMI Body mass index is 22.68 kg/m.  Wt Readings from Last 3 Encounters:  11/23/18 124 lb (56.2 kg)  08/16/18 115 lb 12.8 oz (52.5 kg)  02/23/18 107 lb 1.9 oz (48.6 kg)   BP by me is 160/80  General: Alert and in no acute distress.   HEENT: Normal.  Neck: Supple, no JVD, carotid bruits, or masses noted.  Cardiac: Regular rate and rhythm. No murmurs,  rubs, or gallops. No edema.  Respiratory:  Lungs are clear to auscultation bilaterally with normal work of breathing.  GI: Soft and nontender.  MS: No deformity or atrophy. Gait and ROM intact.  Skin: Warm and dry. Color is normal.  Neuro:  Strength and sensation are intact and no gross focal deficits noted.  Psych: Alert, appropriate and with normal affect.   LABORATORY DATA:  EKG:  EKG is ordered today. This demonstrates NSR with 1st degree AV block with PVC and chronic septal Q's.   Lab Results  Component Value Date  WBC 4.1 02/23/2018   HGB 13.1 02/23/2018   HCT 40.3 02/23/2018   PLT 189 02/23/2018   GLUCOSE 110 (H) 02/23/2018   CHOL 179 02/16/2017   TRIG 103 02/16/2017   HDL 57 02/16/2017   LDLCALC 101 (H) 02/16/2017   ALT 12 02/15/2018   AST 22 02/15/2018   NA 144 02/23/2018   K 4.5 02/23/2018   CL 107 (H) 02/23/2018   CREATININE 0.96 02/23/2018   BUN 19 02/23/2018   CO2 23 02/23/2018   TSH 0.443 11/19/2014   INR 1.17 02/15/2018   HGBA1C 5.8 (H) 08/31/2015       BNP (last 3 results) No results for input(s): BNP in the last 8760 hours.  ProBNP (last 3 results) No results for input(s): PROBNP in the last 8760 hours.   Other Studies Reviewed Today:   Renal Artery Duplex 05/2018:  Right: Normal size right kidney. Normal right Resistive Index. Abnormal cortical thickness of right kidney. Evidence of a greater than 60% stenosis of the right renal artery, essentially stable when compared to the previous exam. RRV flow present. Cyst noted in the upper pole, measuring 2.0 x 1.6 x 1.6 cm. Left: Normal size of left kidney. Abnormal left Resistive Index. Abnormal cortical thickness of the left kidney. Evidence of a >60% stenosis in the left renal artery, essentially stable when compared to the previous exam. LRV flow present. Mesenteric: Normal Celiac artery and Superior Mesenteric artery findings.   Patent IVC.  Suggest follow up study in 12 months.  Electronically signed by Larae Grooms MD on 06/03/2018 at 6:20:48 PM.   Carotid Doppler Summary 05/2018: Right Carotid: Velocities in the right ICA are consistent with a 1-39% stenosis. The ECA appears <50% stenosed.  Left Carotid: Velocities in the left ICA are consistent with a 1-39% stenosis. The ECA appears <50% stenosed.  Vertebrals: Bilateral vertebral arteries demonstrate antegrade flow. Subclavians: Normal flow hemodynamics were seen in bilateral subclavian arteries.  Suggest follow up study in 12 months.  Electronically signed by Larae Grooms MD on 06/03/2018 at 6:19:30 PM.  Myoview Study Highlights 2016   There was no ST segment deviation noted during stress.  This is a low risk study.  Perfusion imaging shows no evidence of ischemia or scar. Study was not gated because of the atrial fibrillation. Consider echocardiogram to assess LV systolic function if needed.   Echo Study Conclusions 2016  - Left ventricle: The cavity size was normal. Wall thickness was increased in a pattern of mild LVH. Systolic function was normal. The estimated ejection fraction was in the range of 55% to 60%. Wall motion was normal; there were no regional wall motion abnormalities. - Mitral valve: There was mild regurgitation. - Left atrium: The atrium was mildly dilated. - Right atrium: The atrium was mildly dilated. - Tricuspid valve: There was moderate-severe regurgitation. - Pulmonary arteries: Systolic pressure was mildly increased. PA peak pressure: 39 mm Hg (S). - Pericardium, extracardiac: A trivial pericardial effusion was identified.    Assessment/Plan:  1. PAF - on Tikosyn -her HR has improved - she still has 1st degree AV block - she remains without any symptoms. Discussed with EP at last visit and cut her Coreg back - will stop this  today. She needs follow up lab today.   2. High risk medicine- needs labs today.    3. HTN-BP is up - stopping Coreg and will increase her Norvasc to 10 mg a day.   4. PAD -[facility]follow[facility] by [facility]- recent studies from  May noted - repeat in one year. Not discussed today.   5. Chronic diastolic HF-still seems compensated and has no symptoms. I suspect this is from excess calories.   6. Chronic anticoagulation -she is on the lower dose of Eliquis due to age and weight. Rechecking lab today.   7. Valvular heart disease - last echo noted - no symptoms of CHF noted - would favor conservative management.   8.  COVID-19 Education: The signs and symptoms of COVID-19 were discussed with the patient and how to seek care for testing (follow up with PCP or arrange E-visit).  The importance of social distancing, staying at home, hand hygiene and wearing a mask when out in public were discussed today.  Current medicines are reviewed with the patient today.  The patient does not have concerns regarding medicines other than what has been noted above.  The following changes have been made:  See above.  Labs/ tests ordered today include:    Orders Placed This Encounter  Procedures  . Basic metabolic panel  . Magnesium  . CBC no Diff  . EKG 12/Charge capture     Disposition:   FU with [place] in 4 months.    Patient is agreeable to this plan and will call if any problems develop in the interim.   SignedTruitt Merle, NP  11/23/2018 11:29 AM  [facility] [address] [place], [place]  [address] Phone: [phone] Fax: [phone]

## 2018-11-22 ENCOUNTER — Ambulatory Visit: Payer: Medicare Other | Admitting: Neurology

## 2018-11-23 ENCOUNTER — Encounter: Payer: Self-pay | Admitting: Nurse Practitioner

## 2018-11-23 ENCOUNTER — Other Ambulatory Visit: Payer: Self-pay

## 2018-11-23 ENCOUNTER — Ambulatory Visit (INDEPENDENT_AMBULATORY_CARE_PROVIDER_SITE_OTHER): Payer: Medicare Other | Admitting: Nurse Practitioner

## 2018-11-23 ENCOUNTER — Encounter (INDEPENDENT_AMBULATORY_CARE_PROVIDER_SITE_OTHER): Payer: Self-pay

## 2018-11-23 VITALS — BP 142/80 | HR 70 | Ht 62.0 in | Wt 124.0 lb

## 2018-11-23 DIAGNOSIS — Z7189 Other specified counseling: Secondary | ICD-10-CM | POA: Diagnosis not present

## 2018-11-23 DIAGNOSIS — I440 Atrioventricular block, first degree: Secondary | ICD-10-CM

## 2018-11-23 DIAGNOSIS — Z79899 Other long term (current) drug therapy: Secondary | ICD-10-CM | POA: Diagnosis not present

## 2018-11-23 DIAGNOSIS — I4819 Other persistent atrial fibrillation: Secondary | ICD-10-CM | POA: Diagnosis not present

## 2018-11-23 DIAGNOSIS — I739 Peripheral vascular disease, unspecified: Secondary | ICD-10-CM | POA: Diagnosis not present

## 2018-11-23 DIAGNOSIS — I701 Atherosclerosis of renal artery: Secondary | ICD-10-CM | POA: Diagnosis not present

## 2018-11-23 DIAGNOSIS — Z7901 Long term (current) use of anticoagulants: Secondary | ICD-10-CM | POA: Diagnosis not present

## 2018-11-23 LAB — BASIC METABOLIC PANEL
BUN/Creatinine Ratio: 20 (ref 12–28)
BUN: 22 mg/dL (ref 8–27)
CO2: 22 mmol/L (ref 20–29)
Calcium: 10.3 mg/dL (ref 8.7–10.3)
Chloride: 106 mmol/L (ref 96–106)
Creatinine, Ser: 1.12 mg/dL — ABNORMAL HIGH (ref 0.57–1.00)
GFR calc Af Amer: 53 mL/min/{1.73_m2} — ABNORMAL LOW (ref >59)
GFR calc non Af Amer: 46 mL/min/{1.73_m2} — ABNORMAL LOW (ref >59)
Glucose: 89 mg/dL (ref 65–99)
Potassium: 4.9 mmol/L (ref 3.5–5.2)
Sodium: 141 mmol/L (ref 134–144)

## 2018-11-23 LAB — MAGNESIUM: Magnesium: 2 mg/dL (ref 1.6–2.3)

## 2018-11-23 LAB — CBC
Hematocrit: 39.7 % (ref 34.0–46.6)
Hemoglobin: 13.9 g/dL (ref 11.1–15.9)
MCH: 32 pg (ref 26.6–33.0)
MCHC: 35 g/dL (ref 31.5–35.7)
MCV: 92 fL (ref 79–97)
Platelets: 210 10*3/uL (ref 150–450)
RBC: 4.34 x10E6/uL (ref 3.77–5.28)
RDW: 12.5 % (ref 11.7–15.4)
WBC: 4 10*3/uL (ref 3.4–10.8)

## 2018-11-23 MED ORDER — AMLODIPINE BESYLATE 10 MG PO TABS: 10.0000 mg | ORAL_TABLET | Freq: Every day | ORAL | 3 refills | Status: DC

## 2018-11-23 NOTE — Patient Instructions (Addendum)
After Visit Summary:  We will be checking the following labs today - BMET, CBC and Mag level   Medication Instructions:    Continue with your current medicines. BUT  I am stopping COREG today and increasing NORVASC to 10 mg a day  I will ask our staff to look into getting a prior authorization for your Tikosyn   If you need a refill on your cardiac medications before your next appointment, please call your pharmacy.     Testing/Procedures To Be Arranged:  N/A  Follow-Up:   See me in 4 months with EKG    At [facility], you and your health needs are our priority.  As part of our continuing mission to provide you with exceptional heart care, we have created designated Provider Care Teams.  These Care Teams include your primary Cardiologist (physician) and Advanced Practice Providers (APPs -  Physician Assistants and Nurse Practitioners) who all work together to provide you with the care you need, when you need it.  Special Instructions:  . Stay safe, stay home, wash your hands for at least 20 seconds and wear a mask when out in public.  . It was good to talk with you today.    Call the [place] office at [phone] if you have any questions, problems or concerns.

## 2018-11-24 ENCOUNTER — Telehealth: Payer: Self-pay

## 2018-11-24 NOTE — Telephone Encounter (Signed)
**Note De-Identified  Obfuscation** While at a OV with [name], NP this morning the pt reported that she needed a PA done on her Dofetilide. Per her chart I did do a tier exception on 02/24/2018 that is in effect until 01/06/2019 (a PA is not required per [place]) .  Per the pts pharmacy the pt paid $652/90 day supply on 11/08/2018 which is basically the price she has been paying since 02/2018 when tier exception was granted and that the actual cost for Dofetilide is $3300/30 day supply.  There is no longer a Tikosyn/Dofetilide pt asst available.  I am reaching out to Raquel Sarna, LCSW to see if there are any discounts or programs that she is aware of that can help lower the cost of  Dofetilide for the pt.

## 2018-11-25 ENCOUNTER — Other Ambulatory Visit: Payer: Self-pay

## 2018-11-25 ENCOUNTER — Telehealth (HOSPITAL_COMMUNITY): Payer: Self-pay | Admitting: Licensed Clinical Social Worker

## 2018-11-25 DIAGNOSIS — I480 Paroxysmal atrial fibrillation: Secondary | ICD-10-CM

## 2018-11-25 MED ORDER — DOFETILIDE 125 MCG PO CAPS: ORAL_CAPSULE | ORAL | 2 refills | Status: DC

## 2018-11-25 NOTE — Telephone Encounter (Signed)
CSW consulted to help with medication cost concerns.  Per consult patient paying $652/90 day supply even after tier exception.  CSW spoke with pt spouse who confirms this cost- states they can afford this but had called MD office because they were concerned about cost increasing again next year and wanted to go ahead and request the tier exception.  CSW explained that this could not be submitted until the new year- suggested they call back in January.  Unfortunately no other options to make medication more affordable at this time  CSW will continue to follow and assist as needed  Jorge Ny, [facility] Desk#: [phone] Cell#: [phone]

## 2018-12-21 ENCOUNTER — Ambulatory Visit (INDEPENDENT_AMBULATORY_CARE_PROVIDER_SITE_OTHER): Payer: Medicare Other | Admitting: Neurology

## 2018-12-21 ENCOUNTER — Encounter: Payer: Self-pay | Admitting: Neurology

## 2018-12-21 ENCOUNTER — Other Ambulatory Visit: Payer: Self-pay

## 2018-12-21 DIAGNOSIS — R519 Headache, unspecified: Secondary | ICD-10-CM | POA: Insufficient documentation

## 2018-12-21 MED ORDER — GABAPENTIN 100 MG PO CAPS: ORAL_CAPSULE | ORAL | 0 refills | Status: DC

## 2018-12-21 NOTE — Patient Instructions (Addendum)
We will try to decrease your dose of Gabapentin, you are currently taking 100 mg in the morning, 200 mg at bedtime. Try to give her 100 mg twice a day for 1-2 weeks if she doesn't complain of headache, have her then take 100 mg at bedtime for 1-2 weeks. Afterwards, you may stop the medication if no compliant of headache.   Continue the Namzaric as prescribed by your primary doctor.

## 2018-12-21 NOTE — Progress Notes (Signed)
PATIENT: Donna [name] DOB: 01/11/1935  REASON FOR VISIT: follow up HISTORY FROM: patient  HISTORY OF PRESENT ILLNESS: Today 12/21/18  Donna [name] is an 83 year old female with history of headache.  She remains on gabapentin 100 mg in the morning, 200 mg at bedtime.  She indicates she is no longer having headaches.  She lives with her husband.  Her daughter manages her medications in a pillbox.  She does have history of memory trouble.  She was treated with Namzaric, but the medication was stopped earlier this year, thinking it could be the cause of headaches.  They tell me Namzaric was recently restarted a few weeks ago by their primary doctor.  She does have history of memory disturbance.  Her husband reports she has repetitive questioning, often will misplace things in the house.  She is not able to be left alone.  She requires assistance with her ADLs.  She is able to do her toileting independently.  She has not had any falls.  She enjoys watching TV.  Her husband denies any agitation or hallucinations.  She sleeps well at night.  She presents today for evaluation accompanied by her husband.  HISTORY  05/19/2018 SS: Donna [name] is a 83 year old female with history of headache.  Laboratory evaluation February 2020 was unremarkable.  She had x-ray of the cervical spine showing multilevel degenerative changes.  She is currently taking gabapentin 100 mg in the morning and 200 mg at bedtime.  She reports she is no longer having any headaches.  She says she is doing great.  She reports the medication has fixed the problem.  She says she is tolerating gabapentin well and it is very beneficial.  She denies any new problems or concerns.  She would like to continue taking gabapentin.  She says her daughter manages her medications, fills her pillboxes weekly.  02/18/2018 Dr. Jannifer Franklin: Donna [name] is an14 year old right-handed black female with a history of headaches that began almost a year ago, she had a CT  scan of the brain in March 2019 because of headaches. Over time, the headaches have become more frequent, she is now having headaches about every other day. The patient has a history of a memory disturbance, she has been on Namzeric, she comes into the office today with her husband who mainly gives the history. The patient has been taken off of Namzeric as it was feared that the medication was causing the headaches, but the patient has been on this medication for a number of years. The patient has had some weight loss on Namzeric recently. The patient reports that the headaches are in the occipital areas bilaterally, she does have some discomfort into the upper neck areas as well. The headaches are a dull achy pain, they are never disabling, not associated with nausea or vomiting or photophobia or phonophobia. She denies any severe neck stiffness or pain into the shoulders or down the arms. The patient reports no numbness or weakness of the face, arms, legs. She denies any balance issues or difficulty controlling the bowels or the bladder. The husband does not relate any other medication adjustments or new medications that correlate with the onset of the headache. The headaches tend to be more significant in the morning. The patient takes Tylenol for her arthritis and this seems to help the headache as well. The patient is on anticoagulant therapy. A recent CT scan of the brain was done, this was unremarkable  REVIEW OF SYSTEMS: Out of  a complete 14 system review of symptoms, the patient complains only of the following symptoms, and all other reviewed systems are negative.  Headache, memory loss  ALLERGIES: Allergies  Allergen Reactions  . Tagamet [Cimetidine] Hives  . Diovan [Valsartan]     Hair loss    HOME MEDICATIONS: Outpatient Medications Prior to Visit  Medication Sig Dispense Refill  . acetaminophen (TYLENOL) 650 MG CR tablet Take 650 mg every 8 (eight) hours as needed by mouth  for pain.    Marland Kitchen amLODipine (NORVASC) 10 MG tablet Take 1 tablet (10 mg total) by mouth daily. 90 tablet 3  . apixaban (ELIQUIS) 2.5 MG TABS tablet Take 1 tablet (2.5 mg total) by mouth 2 (two) times daily. 60 tablet 6  . diclofenac sodium (VOLTAREN) 1 % GEL Apply 1 application topically daily.  1  . dofetilide (TIKOSYN) 125 MCG capsule TAKE 3 CAPSULES BY MOUTH TWICE DAILY 540 capsule 2  . furosemide (LASIX) 20 MG tablet Take 1 tablet by mouth once daily 90 tablet 3  . gabapentin (NEURONTIN) 100 MG capsule One capsule in the morning and 2 in the evening 270 capsule 1  . magnesium oxide (MAG-OX) 400 MG tablet TAKE 1 TABLET BY MOUTH IN THE MORNING 90 tablet 2  . metFORMIN (GLUCOPHAGE-XR) 500 MG 24 hr tablet Take 500 mg by mouth daily.  6  . mirtazapine (REMERON) 15 MG tablet Take 15 mg by mouth daily.    . Multiple Vitamin (MULTIVITAMIN WITH MINERALS) TABS tablet Take 1 tablet by mouth daily.    . nitroGLYCERIN (NITROSTAT) 0.4 MG SL tablet Place 1 tablet (0.4 mg total) under the tongue every 5 (five) minutes as needed for chest pain. 25 tablet 3  . potassium chloride SA (KLOR-CON M20) 20 MEQ tablet Take 1 tablet (20 mEq total) by mouth daily. 90 tablet 3  . vitamin E 400 UNIT capsule Take 400 Units by mouth daily.     No facility-administered medications prior to visit.    PAST MEDICAL HISTORY: Past Medical History:  Diagnosis Date  . Atherosclerosis of renal artery (HCC)    a. s/p R RA stenting;  b. 04/2014 Renal Art duplex: Patent R RA stent, <60% L RA stenosis. Followed by [facility].  . Carotid arterial disease ([place])    a. 01/2014 Carotid U/S: RICA [id number], LICA [id number]. Followed by [facility].  . CKD (chronic kidney disease), stage III    Stage II/III  . Essential hypertension   . Fibrocystic breast   . Fibromuscular dysplasia ([place])   . Hyperglycemia   . Mitral regurgitation    a. Echo 08/2014: mild MR.  . Paroxysmal atrial fibrillation ([place])    a. Dx 07/26/2014, CHA2DS2VASc = 5-->Eliquis;  c. 08/2014 Echo:  EF 55-60%, no rwma, mildly dil LA/RA, mod-sev TR, PASP 46mmHg. b. s/p DCCV 08/2014. c. back in atrial flutter 09/2014 -> rate control pursued.  . Paroxysmal atrial flutter ([place])    a. Dx 07/26/2014, CHA2DS2VASc = 5-->Eliquis;  c. 08/2014 Echo: EF 55-60%, no rwma, mildly dil LA/RA, mod-sev TR, PASP 66mmHg. b. s/p DCCV 08/2014. c. back in atrial flutter 09/2014 -> rate control pursued.  . Tricuspid regurgitation    a. Echo 08/2014: mod-severe.    PAST SURGICAL HISTORY: Past Surgical History:  Procedure Laterality Date  . CARDIOVERSION N/A 09/01/2014   Procedure: CARDIOVERSION;  Surgeon: Josue Hector, MD;  Location[facility] ENDOSCOPY;  Service: Cardiovascular;  Laterality: N/A;  . CARDIOVERSION N/A 11/23/2014   Procedure: CARDIOVERSION;  Surgeon: Skeet Latch, MD;  Location: [facility] ENDOSCOPY;  Service: Cardiovascular;  Laterality: N/A;  . HEMORROIDECTOMY    . REDUCTION MAMMAPLASTY Bilateral   . RENAL ARTERY STENT  2008   Right renal artery by Dr. Drucie Opitz  . TONSILLECTOMY    . TUBAL LIGATION  1970's    FAMILY HISTORY: Family History  Problem Relation Age of Onset  . Stroke Mother 13  . Diabetes Mother   . Hypertension Mother   . Aneurysm Father        abdominal aortic aneurysm  . Diabetes Father   . Hypertension Father   . Heart attack Father   . Cancer Sister 10       breast cancer  . Breast cancer Neg Hx     SOCIAL HISTORY: Social History   Socioeconomic History  . Marital status: Married    Spouse name: Not on file  . Number of children: Not on file  . Years of education: Not on file  . Highest education level: Not on file  Occupational History  . Not on file  Tobacco Use  . Smoking status: Never Smoker  . Smokeless tobacco: Never Used  Substance and Sexual Activity  . Alcohol use: Yes  . Drug use: No  . Sexual activity: Yes    Birth control/protection: Surgical    Comment: BTL  Other Topics Concern  . Not on file  Social History Narrative  . Not on file   Social  Determinants of Health   Financial Resource Strain:   . Difficulty of Paying Living Expenses: Not on file  Food Insecurity:   . Worried About Charity fundraiser in the Last Year: Not on file  . Ran Out of Food in the Last Year: Not on file  Transportation Needs:   . Lack of Transportation (Medical): Not on file  . Lack of Transportation (Non-Medical): Not on file  Physical Activity:   . Days of Exercise per Week: Not on file  . Minutes of Exercise per Session: Not on file  Stress:   . Feeling of Stress : Not on file  Social Connections:   . Frequency of Communication with Friends and Family: Not on file  . Frequency of Social Gatherings with Friends and Family: Not on file  . Attends Religious Services: Not on file  . Active Member of Clubs or Organizations: Not on file  . Attends Archivist Meetings: Not on file  . Marital Status: Not on file  Intimate Partner Violence:   . Fear of Current or Ex-Partner: Not on file  . Emotionally Abused: Not on file  . Physically Abused: Not on file  . Sexually Abused: Not on file      PHYSICAL EXAM  Vitals:   12/21/18 0755  BP: 140/78  Pulse: 86  Temp: 97.7 F (36.5 C)  TempSrc: Oral  Weight: 121 lb 9.6 oz (55.2 kg)  Height: 5\' 2"  (1.575 m)   Body mass index is 22.24 kg/m.  Generalized: Well developed, in no acute distress  MMSE - Mini Mental State Exam 12/21/2018  Orientation to time 0  Orientation to Place 5  Registration 3  Attention/ Calculation 0  Recall 1  Language- name 2 objects 2  Language- repeat 1  Language- follow 3 step command 3  Language- read & follow direction 1  Write a sentence 1  Copy design 1  Total score 18    Neurological examination  Mentation: Alert, oriented, most of history is provided by her husband. Follows  all commands speech and language fluent Cranial nerve II-XII: Pupils were equal round reactive to light. Extraocular movements were full, visual field were full on  confrontational test. Facial sensation and strength were normal. Head turning and shoulder shrug  were normal and symmetric. Motor: The motor testing reveals 5 over 5 strength of all 4 extremities. Good symmetric motor tone is noted throughout.  Sensory: Sensory testing is intact to soft touch on all 4 extremities. No evidence of extinction is noted.  Coordination: Cerebellar testing reveals good finger-nose-finger and heel-to-shin bilaterally.  Gait and station: Gait is normal.  Reflexes: Deep tendon reflexes are symmetric and normal bilaterally.   DIAGNOSTIC DATA (LABS, IMAGING, TESTING) - I reviewed patient records, labs, notes, testing and imaging myself where available.  Lab Results  Component Value Date   WBC 4.0 11/23/2018   HGB 13.9 11/23/2018   HCT 39.7 11/23/2018   MCV 92 11/23/2018   PLT 210 11/23/2018      Component Value Date/Time   NA 141 11/23/2018 1152   K 4.9 11/23/2018 1152   CL 106 11/23/2018 1152   CO2 22 11/23/2018 1152   GLUCOSE 89 11/23/2018 1152   GLUCOSE 99 02/15/2018 1534   BUN 22 11/23/2018 1152   CREATININE 1.12 (H) 11/23/2018 1152   CREATININE 1.06 (H) 07/05/2015 1148   CALCIUM 10.3 11/23/2018 1152   PROT 6.8 02/15/2018 1534   PROT 7.3 02/16/2017 1623   ALBUMIN 3.9 02/15/2018 1534   ALBUMIN 4.4 02/16/2017 1623   AST 22 02/15/2018 1534   ALT 12 02/15/2018 1534   ALKPHOS 41 02/15/2018 1534   BILITOT 0.3 02/15/2018 1534   BILITOT 0.3 02/16/2017 1623   GFRNONAA 46 (L) 11/23/2018 1152   GFRAA 53 (L) 11/23/2018 1152   Lab Results  Component Value Date   CHOL 179 02/16/2017   HDL 57 02/16/2017   LDLCALC 101 (H) 02/16/2017   TRIG 103 02/16/2017   CHOLHDL 3.1 02/16/2017   Lab Results  Component Value Date   HGBA1C 5.8 (H) 08/31/2015   No results found for: [id number] Lab Results  Component Value Date   TSH 0.443 11/19/2014      ASSESSMENT AND PLAN 83 y.o. year old female  has a past medical history of Atherosclerosis of renal artery  ([place]), Carotid arterial disease ([place]), CKD (chronic kidney disease), stage III, Essential hypertension, Fibrocystic breast, Fibromuscular dysplasia ([place]), Hyperglycemia, Mitral regurgitation, Paroxysmal atrial fibrillation ([place]), Paroxysmal atrial flutter ([place]), and Tricuspid regurgitation. here with:  1.  Occipital headache 2.  Memory loss  She no longer complains of headache.  We will try to wean her off gabapentin.  She will start taking gabapentin 100 mg twice a day for 1 to 2 weeks, if she does well and does not complain of headache, she will take gabapentin 100 mg at bedtime, if she continues to do well, she can stop the medication.  Of course if her headaches return, the medication can be resumed.  I have sent in a short supply of gabapentin to cover the taper.  In regards to her memory, it seems this has been managed for many years by her primary doctor.  She is recently restarted Namzaric.  Her memory score was 18/30 today.  She is also taking Remeron.  They tell me she sleeps well at night, denies agitation or hallucinations.  She is requiring additional supervision, which is expected during the course of memory progression.  She had a CT scan of the brain in February 2020, showing  atrophy and small vessel disease, no acute intracranial findings.  She will follow-up in 6 months or sooner if needed.  I did advise if her symptoms worsen or she develops any new symptoms she should let us know.  I called and talked with her daughter [name], to provide update (she sent me a packet of information), she had concerns about her memory. This daughter lives in [place]. We discussed she will remain on Namzaric, continue follow-up with PCP. She is also taking Remeron.   I spent 15 minutes with the patient. 50% of this time was spent discussing her plan   Evangeline Dakin, DNP 12/21/2018, 8:03 AM [facility] [address] [place], [place] [address] [phone]

## 2018-12-22 NOTE — Progress Notes (Signed)
I have read the note, and I agree with the clinical assessment and plan.  Dessa Ledee K Mikaelyn Arthurs   

## 2018-12-27 ENCOUNTER — Other Ambulatory Visit: Payer: Self-pay | Admitting: Nurse Practitioner

## 2019-01-17 DIAGNOSIS — F0390 Unspecified dementia without behavioral disturbance: Secondary | ICD-10-CM | POA: Diagnosis not present

## 2019-01-17 DIAGNOSIS — I480 Paroxysmal atrial fibrillation: Secondary | ICD-10-CM | POA: Diagnosis not present

## 2019-01-17 DIAGNOSIS — I10 Essential (primary) hypertension: Secondary | ICD-10-CM | POA: Diagnosis not present

## 2019-01-17 DIAGNOSIS — I4891 Unspecified atrial fibrillation: Secondary | ICD-10-CM | POA: Diagnosis not present

## 2019-01-17 DIAGNOSIS — E119 Type 2 diabetes mellitus without complications: Secondary | ICD-10-CM | POA: Diagnosis not present

## 2019-01-17 DIAGNOSIS — I509 Heart failure, unspecified: Secondary | ICD-10-CM | POA: Diagnosis not present

## 2019-01-17 DIAGNOSIS — F341 Dysthymic disorder: Secondary | ICD-10-CM | POA: Diagnosis not present

## 2019-01-17 DIAGNOSIS — I701 Atherosclerosis of renal artery: Secondary | ICD-10-CM | POA: Diagnosis not present

## 2019-01-17 DIAGNOSIS — E782 Mixed hyperlipidemia: Secondary | ICD-10-CM | POA: Diagnosis not present

## 2019-01-27 ENCOUNTER — Institutional Professional Consult (permissible substitution): Payer: Medicare Other | Admitting: Neurology

## 2019-02-17 DIAGNOSIS — I701 Atherosclerosis of renal artery: Secondary | ICD-10-CM | POA: Diagnosis not present

## 2019-02-17 DIAGNOSIS — I4891 Unspecified atrial fibrillation: Secondary | ICD-10-CM | POA: Diagnosis not present

## 2019-02-17 DIAGNOSIS — F0390 Unspecified dementia without behavioral disturbance: Secondary | ICD-10-CM | POA: Diagnosis not present

## 2019-02-17 DIAGNOSIS — E782 Mixed hyperlipidemia: Secondary | ICD-10-CM | POA: Diagnosis not present

## 2019-02-17 DIAGNOSIS — I480 Paroxysmal atrial fibrillation: Secondary | ICD-10-CM | POA: Diagnosis not present

## 2019-02-17 DIAGNOSIS — I10 Essential (primary) hypertension: Secondary | ICD-10-CM | POA: Diagnosis not present

## 2019-02-17 DIAGNOSIS — I509 Heart failure, unspecified: Secondary | ICD-10-CM | POA: Diagnosis not present

## 2019-02-17 DIAGNOSIS — E119 Type 2 diabetes mellitus without complications: Secondary | ICD-10-CM | POA: Diagnosis not present

## 2019-02-17 DIAGNOSIS — F341 Dysthymic disorder: Secondary | ICD-10-CM | POA: Diagnosis not present

## 2019-03-15 NOTE — Progress Notes (Signed)
CARDIOLOGY OFFICE NOTE  Date:  03/22/2019    Donna [name] Date of Birth: 03/20/1935 Medical Record [id number]  PCP:  Josetta Huddle, MD  Cardiologist:  Jennings Books    Chief Complaint  Patient presents with  . Follow-up    Seen for Dr. Tamala Julian    History of Present Illness: Donna [name] is a 84 y.o. female who presents today for a follow up visit. Seen for Dr. Tamala Julian.   She has a history of atherosclerotic kidney disease, hypertension, PAF - on Tikosyn, chronic diastolic heart failure when in A. fib, and chronic anticoagulation therapy with Eliquis.  Last seen by Dr. Huntley Estelle of 2017. Was doing ok at that visit.Then lost to follow up until February of2019- thenseen by me and I have followed her since - medicines very unclearand this has been a recurrent theme.Ihavealso suspectedmedication non compliance as well. She has had 1st degree AV block - Coreg has been cut back. Last seen back in November - seemed to be doing ok.   The patient does not have symptoms concerning for COVID-19 infection (fever, chills, cough, or new shortness of breath).   Comes in today. Here with her husband. She says she is doing ok. They have both had their COVID vaccines - she does not seem to remember this. No chest pain. Breathing is ok. Not dizzy. BP is ok. Weight is stable. He is asking about help with her medicines - Tikosyn and Eliquis. She needs labs today. Not dizzy or lightheaded. They have been staying in for the most part.   Past Medical History:  Diagnosis Date  . Atherosclerosis of renal artery (HCC)    a. s/p R RA stenting;  b. 04/2014 Renal Art duplex: Patent R RA stent, <60% L RA stenosis. Followed by [facility].  . Carotid arterial disease ([place])    a. 01/2014 Carotid U/S: RICA [id number], LICA AB-[id number]. Followed by [facility].  . CKD (chronic kidney disease), stage III    Stage II/III  . Essential hypertension   . Fibrocystic breast   . Fibromuscular dysplasia ([place])   .  Hyperglycemia   . Mitral regurgitation    a. Echo 08/2014: mild MR.  . Paroxysmal atrial fibrillation ([place])    a. Dx 07/26/2014, CHA2DS2VASc = 5-->Eliquis;  c. 08/2014 Echo: EF 55-60%, no rwma, mildly dil LA/RA, mod-sev TR, PASP 43mmHg. b. s/p DCCV 08/2014. c. back in atrial flutter 09/2014 -> rate control pursued.  . Paroxysmal atrial flutter ([place])    a. Dx 07/26/2014, CHA2DS2VASc = 5-->Eliquis;  c. 08/2014 Echo: EF 55-60%, no rwma, mildly dil LA/RA, mod-sev TR, PASP 49mmHg. b. s/p DCCV 08/2014. c. back in atrial flutter 09/2014 -> rate control pursued.  . Tricuspid regurgitation    a. Echo 08/2014: mod-severe.    Past Surgical History:  Procedure Laterality Date  . CARDIOVERSION N/A 09/01/2014   Procedure: CARDIOVERSION;  Surgeon: Josue Hector, MD;  Location: [facility] ENDOSCOPY;  Service: Cardiovascular;  Laterality: N/A;  . CARDIOVERSION N/A 11/23/2014   Procedure: CARDIOVERSION;  Surgeon: Skeet Latch, MD;  Location: [place];  Service: Cardiovascular;  Laterality: N/A;  . HEMORROIDECTOMY    . REDUCTION MAMMAPLASTY Bilateral   . RENAL ARTERY STENT  2008   Right renal artery by Dr. Drucie Opitz  . TONSILLECTOMY    . TUBAL LIGATION  1970's     Medications: Current Meds  Medication Sig  . acetaminophen (TYLENOL) 650 MG CR tablet Take 650 mg every 8 (eight) hours  as needed by mouth for pain.  Marland Kitchen amLODipine (NORVASC) 10 MG tablet Take 1 tablet (10 mg total) by mouth daily.  Marland Kitchen apixaban (ELIQUIS) 2.5 MG TABS tablet Take 1 tablet (2.5 mg total) by mouth 2 (two) times daily.  . diclofenac sodium (VOLTAREN) 1 % GEL Apply 1 application topically daily.  Marland Kitchen dofetilide (TIKOSYN) 125 MCG capsule TAKE 3 CAPSULES BY MOUTH TWICE DAILY  . furosemide (LASIX) 20 MG tablet Take 1 tablet by mouth once daily  . gabapentin (NEURONTIN) 100 MG capsule One capsule in the morning and 2 in the evening  . magnesium oxide (MAG-OX) 400 MG tablet TAKE 1 TABLET BY MOUTH IN THE MORNING  . Memantine HCl-Donepezil HCl  28-10 MG CP24 Take by mouth.  . metFORMIN (GLUCOPHAGE-XR) 500 MG 24 hr tablet Take 500 mg by mouth daily.  . mirtazapine (REMERON) 15 MG tablet Take 15 mg by mouth daily.  . Multiple Vitamin (MULTIVITAMIN WITH MINERALS) TABS tablet Take 1 tablet by mouth daily.  . nitroGLYCERIN (NITROSTAT) 0.4 MG SL tablet Place 1 tablet (0.4 mg total) under the tongue every 5 (five) minutes as needed for chest pain.  . potassium chloride SA (KLOR-CON M20) 20 MEQ tablet Take 1 tablet (20 mEq total) by mouth daily.  . vitamin E 400 UNIT capsule Take 400 Units by mouth daily.     Allergies: Allergies  Allergen Reactions  . Tagamet [Cimetidine] Hives  . Diovan [Valsartan]     Hair loss    Social History: The patient  reports that she has never smoked. She has never used smokeless tobacco. She reports current alcohol use. She reports that she does not use drugs.   Family History: The patient's family history includes Aneurysm in her father; Cancer (age of onset: 28) in her sister; Diabetes in her father and mother; Heart attack in her father; Hypertension in her father and mother; Stroke (age of onset: 23) in her mother.   Review of Systems: Please see the history of present illness.   All other systems are reviewed and negative.   Physical Exam: VS:  BP (!) 144/74   Pulse 85   Ht 5\' 2"  (1.575 m)   Wt 124 lb 8 oz (56.5 kg)   SpO2 98%   BMI 22.77 kg/m  .  BMI Body mass index is 22.77 kg/m.  Wt Readings from Last 3 Encounters:  03/22/19 124 lb 8 oz (56.5 kg)  12/21/18 121 lb 9.6 oz (55.2 kg)  11/23/18 124 lb (56.2 kg)    General: Pleasant. Alert and in no acute distress.   HEENT: Normal.  Neck: Supple, no JVD, carotid bruits, or masses noted.  Cardiac: Regular rate and rhythm. No murmurs, rubs, or gallops. No edema.  Respiratory:  Lungs are clear to auscultation bilaterally with normal work of breathing.  GI: Soft and nontender.  MS: No deformity or atrophy. Gait and ROM intact.  Skin:  Warm and dry. Color is normal.  Neuro:  Strength and sensation are intact and no gross focal deficits noted.  Psych: Alert, appropriate and with normal affect.   LABORATORY DATA:  EKG:  EKG is ordered today. This demonstrates NSR with 1st degree AV block - HR is 95 - PVC noted. Personally reviewed by me.  Lab Results  Component Value Date   WBC 4.0 11/23/2018   HGB 13.9 11/23/2018   HCT 39.7 11/23/2018   PLT 210 11/23/2018   GLUCOSE 89 11/23/2018   CHOL 179 02/16/2017   TRIG 103 02/16/2017  HDL 57 02/16/2017   LDLCALC 101 (H) 02/16/2017   ALT 12 02/15/2018   AST 22 02/15/2018   NA 141 11/23/2018   K 4.9 11/23/2018   CL 106 11/23/2018   CREATININE 1.12 (H) 11/23/2018   BUN 22 11/23/2018   CO2 22 11/23/2018   TSH 0.443 11/19/2014   INR 1.17 02/15/2018   HGBA1C 5.8 (H) 08/31/2015     BNP (last 3 results) No results for input(s): BNP in the last 8760 hours.  ProBNP (last 3 results) No results for input(s): PROBNP in the last 8760 hours.   Other Studies Reviewed Today:  RenalArtery Duplex 05/2018:  Right: Normal size right kidney. Normal right Resistive Index. Abnormal cortical thickness of right kidney. Evidence of a greater than 60% stenosis of the right renal artery, essentially stable when compared to the previous exam. RRV flow present. Cyst noted in the upper pole, measuring 2.0 x 1.6 x 1.6 cm. Left: Normal size of left kidney. Abnormal left Resistive Index. Abnormal cortical thickness of the left kidney. Evidence of a >60% stenosis in the left renal artery, essentially stable when compared to the previous exam. LRV flow present. Mesenteric: Normal Celiac artery and Superior Mesenteric artery findings.  Patent IVC.  Suggest follow up study in 12 months.  Electronically signed by Larae Grooms MD on 06/03/2018 at 6:20:48 PM.   Carotid DopplerSummary5/2020: Right Carotid: Velocities  in the right ICA are consistent with a 1-39% stenosis. The ECA appears <50% stenosed.  Left Carotid: Velocities in the left ICA are consistent with a 1-39% stenosis. The ECA appears <50% stenosed.  Vertebrals: Bilateral vertebral arteries demonstrate antegrade flow. Subclavians: Normal flow hemodynamics were seen in bilateral subclavian arteries.  Suggest follow up study in 12 months.  Electronically signed by Larae Grooms MD on 06/03/2018 at 6:19:30 PM.  MyoviewStudy Highlights2016   There was no ST segment deviation noted during stress.  This is a low risk study.  Perfusion imaging shows no evidence of ischemia or scar. Study was not gated because of the atrial fibrillation. Consider echocardiogram to assess LV systolic function if needed.   EchoStudy Conclusions2016  - Left ventricle: The cavity size was normal. Wall thickness was increased in a pattern of mild LVH. Systolic function was normal. The estimated ejection fraction was in the range of 55% to 60%. Wall motion was normal; there were no regional wall motion abnormalities. - Mitral valve: There was mild regurgitation. - Left atrium: The atrium was mildly dilated. - Right atrium: The atrium was mildly dilated. - Tricuspid valve: There was moderate-severe regurgitation. - Pulmonary arteries: Systolic pressure was mildly increased. PA peak pressure: 39 mm Hg (S). - Pericardium, extracardiac: A trivial pericardial effusion was identified.    Assessment/Plan:  1.  PAF - on Tikosyn and Eliquis - needs labs today. Trying to get assistance for her medicines.  She is on less Coreg due to 1st degree AV block which has not progressed.   2. Chronic 1st degree AV block - see above.   3. HTN - BP is fair - I still worry about her medication compliance in general.   4. Chronic anticoagulation - needs labs today. Will see if there is some help we  can get for her.   5. Chronic diastolic HF - previously exacerbated when in AF - seems compensated.   6. High risk medicine - needs labs today. We will see if there is some help we can get for her.   7. PAD - needs her  studies updated in May - both carotid doppler and renal duplex. Will arrange.    8. Valvular heart disease by last echo - she is stable - no heart failure noted - would favor conservative management.   9. COVID-19 Education: The signs and symptoms of COVID-19 were discussed with the patient and how to seek care for testing (follow up with PCP or arrange E-visit).  The importance of social distancing, staying at home, hand hygiene and wearing a mask when out in public were discussed today. They have both had their vaccines.   Current medicines are reviewed with the patient today.  The patient does not have concerns regarding medicines other than what has been noted above.  The following changes have been made:  See above.  Labs/ tests ordered today include:    Orders Placed This Encounter  Procedures  . Basic metabolic panel  . CBC  . Hepatic function panel  . Magnesium  . Lipid panel  . EKG 12-Lead  . VAS US CAROTID     Disposition:   FU with me in 6 months.   Patient is agreeable to this plan and will call if any problems develop in the interim.   SignedTruitt Merle, NP  03/22/2019 10:41 AM  [facility] [address] [place], [place]  [address] Phone: [phone] Fax: [phone]

## 2019-03-22 ENCOUNTER — Ambulatory Visit (INDEPENDENT_AMBULATORY_CARE_PROVIDER_SITE_OTHER): Payer: Medicare Other | Admitting: Nurse Practitioner

## 2019-03-22 ENCOUNTER — Other Ambulatory Visit: Payer: Self-pay

## 2019-03-22 ENCOUNTER — Encounter: Payer: Self-pay | Admitting: Nurse Practitioner

## 2019-03-22 VITALS — BP 144/74 | HR 85 | Ht 62.0 in | Wt 124.5 lb

## 2019-03-22 DIAGNOSIS — Z7901 Long term (current) use of anticoagulants: Secondary | ICD-10-CM | POA: Diagnosis not present

## 2019-03-22 DIAGNOSIS — I701 Atherosclerosis of renal artery: Secondary | ICD-10-CM | POA: Diagnosis not present

## 2019-03-22 DIAGNOSIS — I739 Peripheral vascular disease, unspecified: Secondary | ICD-10-CM

## 2019-03-22 DIAGNOSIS — I440 Atrioventricular block, first degree: Secondary | ICD-10-CM | POA: Diagnosis not present

## 2019-03-22 DIAGNOSIS — I480 Paroxysmal atrial fibrillation: Secondary | ICD-10-CM

## 2019-03-22 DIAGNOSIS — Z7189 Other specified counseling: Secondary | ICD-10-CM | POA: Diagnosis not present

## 2019-03-22 DIAGNOSIS — I6523 Occlusion and stenosis of bilateral carotid arteries: Secondary | ICD-10-CM

## 2019-03-22 DIAGNOSIS — Z79899 Other long term (current) drug therapy: Secondary | ICD-10-CM | POA: Diagnosis not present

## 2019-03-22 LAB — CBC
Hematocrit: 44.1 % (ref 34.0–46.6)
Hemoglobin: 15.2 g/dL (ref 11.1–15.9)
MCH: 31.9 pg (ref 26.6–33.0)
MCHC: 34.5 g/dL (ref 31.5–35.7)
MCV: 93 fL (ref 79–97)
Platelets: 207 10*3/uL (ref 150–450)
RBC: 4.76 x10E6/uL (ref 3.77–5.28)
RDW: 12.9 % (ref 11.7–15.4)
WBC: 4.9 10*3/uL (ref 3.4–10.8)

## 2019-03-22 LAB — HEPATIC FUNCTION PANEL
ALT: 14 IU/L (ref 0–32)
AST: 22 IU/L (ref 0–40)
Albumin: 4.6 g/dL (ref 3.6–4.6)
Alkaline Phosphatase: 64 IU/L (ref 39–117)
Bilirubin Total: 0.3 mg/dL (ref 0.0–1.2)
Bilirubin, Direct: 0.1 mg/dL (ref 0.00–0.40)
Total Protein: 7.2 g/dL (ref 6.0–8.5)

## 2019-03-22 LAB — BASIC METABOLIC PANEL
BUN/Creatinine Ratio: 15 (ref 12–28)
BUN: 17 mg/dL (ref 8–27)
CO2: 22 mmol/L (ref 20–29)
Calcium: 10.2 mg/dL (ref 8.7–10.3)
Chloride: 104 mmol/L (ref 96–106)
Creatinine, Ser: 1.13 mg/dL — ABNORMAL HIGH (ref 0.57–1.00)
GFR calc Af Amer: 53 mL/min/{1.73_m2} — ABNORMAL LOW (ref >59)
GFR calc non Af Amer: 46 mL/min/{1.73_m2} — ABNORMAL LOW (ref >59)
Glucose: 141 mg/dL — ABNORMAL HIGH (ref 65–99)
Potassium: 4.4 mmol/L (ref 3.5–5.2)
Sodium: 140 mmol/L (ref 134–144)

## 2019-03-22 LAB — LIPID PANEL
Chol/HDL Ratio: 3.5 ratio (ref 0.0–4.4)
Cholesterol, Total: 204 mg/dL — ABNORMAL HIGH (ref 100–199)
HDL: 59 mg/dL (ref >39)
LDL Chol Calc (NIH): 124 mg/dL — ABNORMAL HIGH (ref 0–99)
Triglycerides: 116 mg/dL (ref 0–149)
VLDL Cholesterol Cal: 21 mg/dL (ref 5–40)

## 2019-03-22 LAB — MAGNESIUM: Magnesium: 2.1 mg/dL (ref 1.6–2.3)

## 2019-03-22 NOTE — Patient Instructions (Addendum)
After Visit Summary:  We will be checking the following labs today - BMET, CBC, HPF, Lipids and Mag level   Medication Instructions:    Continue with your current medicines.    If you need a refill on your cardiac medications before your next appointment, please call your pharmacy.     Testing/Procedures To Be Arranged:  Carotid doppler in May  Renal duplex in May  Follow-Up:   See me in 6 months    At [facility], you and your health needs are our priority.  As part of our continuing mission to provide you with exceptional heart care, we have created designated Provider Care Teams.  These Care Teams include your primary Cardiologist (physician) and Advanced Practice Providers (APPs -  Physician Assistants and Nurse Practitioners) who all work together to provide you with the care you need, when you need it.  Special Instructions:  . Stay safe, stay home, wash your hands for at least 20 seconds and wear a mask when out in public.  . It was good to talk with you today.  . I will send a message to Jeani Hawking - to see about if we can get help for your Tikosyn and Eliquis   Call the [place] office at [phone] if you have any questions, problems or concerns.

## 2019-03-23 ENCOUNTER — Other Ambulatory Visit: Payer: Self-pay | Admitting: *Deleted

## 2019-03-23 DIAGNOSIS — I701 Atherosclerosis of renal artery: Secondary | ICD-10-CM

## 2019-03-23 DIAGNOSIS — I739 Peripheral vascular disease, unspecified: Secondary | ICD-10-CM

## 2019-03-23 MED ORDER — ATORVASTATIN CALCIUM 10 MG PO TABS: 10.0000 mg | ORAL_TABLET | Freq: Every day | ORAL | 11 refills | Status: DC

## 2019-03-24 ENCOUNTER — Telehealth: Payer: Self-pay

## 2019-03-24 NOTE — Telephone Encounter (Signed)
**Note De-Identified  Obfuscation** I started a Dofetilide tier exception through covermymeds: [id number]

## 2019-03-24 NOTE — Telephone Encounter (Signed)
I called and s/w the pts husband, [name] who states that the pt does not need asst with Eliquis as the pts PCP Dr Inda Merlin gives her samples.  He states that he would like for [place] to do a tier exception on the pts Dofetilide as we have done in the past. I have advised him that I will do a tier ex now and will let him know the outcome once we receive the decision.  He thanked me for calling him to discuss.  I called [facility] to get ins info from the card they are filling the pts Dofetilide under and was given the following info: [employer] Part D  ID: [id number] BIN: [id number] PCN: [id number] GRP: None

## 2019-03-24 NOTE — Telephone Encounter (Signed)
**Note De-identified  Obfuscation** -----  **Note De-Identified  Obfuscation** Message from Burtis Junes, NP sent at 03/22/2019 10:40 AM EDT ----- Joanette Gula,   Any way we can look in to Ms. Jorde about some help for her Eliquis and Tikosyn.   Mr. Luchini thinks the PA has ran out.   Thanks, Cecille Rubin

## 2019-03-25 NOTE — Telephone Encounter (Signed)
**Note De-Identified  Obfuscation** Letter received from [facility] stating that they have approved the pts Dofetilide tier ex. It is now at a tier 1. I called Donna [name] and made him aware. He expressed much appreciation for our assistance with this.

## 2019-04-06 DIAGNOSIS — E119 Type 2 diabetes mellitus without complications: Secondary | ICD-10-CM | POA: Diagnosis not present

## 2019-04-06 DIAGNOSIS — I701 Atherosclerosis of renal artery: Secondary | ICD-10-CM | POA: Diagnosis not present

## 2019-04-06 DIAGNOSIS — I10 Essential (primary) hypertension: Secondary | ICD-10-CM | POA: Diagnosis not present

## 2019-04-06 DIAGNOSIS — F0390 Unspecified dementia without behavioral disturbance: Secondary | ICD-10-CM | POA: Diagnosis not present

## 2019-04-06 DIAGNOSIS — I4891 Unspecified atrial fibrillation: Secondary | ICD-10-CM | POA: Diagnosis not present

## 2019-04-06 DIAGNOSIS — I509 Heart failure, unspecified: Secondary | ICD-10-CM | POA: Diagnosis not present

## 2019-04-06 DIAGNOSIS — I480 Paroxysmal atrial fibrillation: Secondary | ICD-10-CM | POA: Diagnosis not present

## 2019-04-06 DIAGNOSIS — E782 Mixed hyperlipidemia: Secondary | ICD-10-CM | POA: Diagnosis not present

## 2019-04-06 DIAGNOSIS — F341 Dysthymic disorder: Secondary | ICD-10-CM | POA: Diagnosis not present

## 2019-05-03 ENCOUNTER — Other Ambulatory Visit: Payer: Medicare Other

## 2019-05-03 ENCOUNTER — Other Ambulatory Visit: Payer: Self-pay

## 2019-05-03 DIAGNOSIS — I739 Peripheral vascular disease, unspecified: Secondary | ICD-10-CM | POA: Diagnosis not present

## 2019-05-03 DIAGNOSIS — I701 Atherosclerosis of renal artery: Secondary | ICD-10-CM | POA: Diagnosis not present

## 2019-05-03 LAB — HEPATIC FUNCTION PANEL
ALT: 16 IU/L (ref 0–32)
AST: 18 IU/L (ref 0–40)
Albumin: 4.5 g/dL (ref 3.6–4.6)
Alkaline Phosphatase: 62 IU/L (ref 39–117)
Bilirubin Total: 0.5 mg/dL (ref 0.0–1.2)
Bilirubin, Direct: 0.17 mg/dL (ref 0.00–0.40)
Total Protein: 7.4 g/dL (ref 6.0–8.5)

## 2019-05-03 LAB — LIPID PANEL
Chol/HDL Ratio: 2.8 ratio (ref 0.0–4.4)
Cholesterol, Total: 145 mg/dL (ref 100–199)
HDL: 52 mg/dL (ref >39)
LDL Chol Calc (NIH): 76 mg/dL (ref 0–99)
Triglycerides: 93 mg/dL (ref 0–149)
VLDL Cholesterol Cal: 17 mg/dL (ref 5–40)

## 2019-05-09 ENCOUNTER — Other Ambulatory Visit: Payer: Self-pay | Admitting: *Deleted

## 2019-05-09 MED ORDER — ATORVASTATIN CALCIUM 10 MG PO TABS: 10.0000 mg | ORAL_TABLET | Freq: Every day | ORAL | 3 refills | Status: DC

## 2019-05-25 DIAGNOSIS — R413 Other amnesia: Secondary | ICD-10-CM | POA: Diagnosis not present

## 2019-05-25 DIAGNOSIS — J309 Allergic rhinitis, unspecified: Secondary | ICD-10-CM | POA: Diagnosis not present

## 2019-05-25 DIAGNOSIS — R32 Unspecified urinary incontinence: Secondary | ICD-10-CM | POA: Diagnosis not present

## 2019-05-25 DIAGNOSIS — N3000 Acute cystitis without hematuria: Secondary | ICD-10-CM | POA: Diagnosis not present

## 2019-06-13 ENCOUNTER — Other Ambulatory Visit (HOSPITAL_COMMUNITY): Payer: Self-pay | Admitting: Nurse Practitioner

## 2019-06-13 DIAGNOSIS — I6523 Occlusion and stenosis of bilateral carotid arteries: Secondary | ICD-10-CM

## 2019-06-13 DIAGNOSIS — I701 Atherosclerosis of renal artery: Secondary | ICD-10-CM

## 2019-06-20 ENCOUNTER — Other Ambulatory Visit: Payer: Self-pay | Admitting: Nurse Practitioner

## 2019-06-21 DIAGNOSIS — Z0000 Encounter for general adult medical examination without abnormal findings: Secondary | ICD-10-CM | POA: Diagnosis not present

## 2019-06-21 DIAGNOSIS — I4891 Unspecified atrial fibrillation: Secondary | ICD-10-CM | POA: Diagnosis not present

## 2019-06-21 DIAGNOSIS — Z23 Encounter for immunization: Secondary | ICD-10-CM | POA: Diagnosis not present

## 2019-06-21 DIAGNOSIS — J309 Allergic rhinitis, unspecified: Secondary | ICD-10-CM | POA: Diagnosis not present

## 2019-06-21 DIAGNOSIS — E559 Vitamin D deficiency, unspecified: Secondary | ICD-10-CM | POA: Diagnosis not present

## 2019-06-21 DIAGNOSIS — I10 Essential (primary) hypertension: Secondary | ICD-10-CM | POA: Diagnosis not present

## 2019-06-21 DIAGNOSIS — F0390 Unspecified dementia without behavioral disturbance: Secondary | ICD-10-CM | POA: Diagnosis not present

## 2019-06-21 DIAGNOSIS — R32 Unspecified urinary incontinence: Secondary | ICD-10-CM | POA: Diagnosis not present

## 2019-06-21 DIAGNOSIS — E782 Mixed hyperlipidemia: Secondary | ICD-10-CM | POA: Diagnosis not present

## 2019-06-21 DIAGNOSIS — I509 Heart failure, unspecified: Secondary | ICD-10-CM | POA: Diagnosis not present

## 2019-06-21 DIAGNOSIS — I701 Atherosclerosis of renal artery: Secondary | ICD-10-CM | POA: Diagnosis not present

## 2019-06-21 DIAGNOSIS — E119 Type 2 diabetes mellitus without complications: Secondary | ICD-10-CM | POA: Diagnosis not present

## 2019-06-21 DIAGNOSIS — Z1389 Encounter for screening for other disorder: Secondary | ICD-10-CM | POA: Diagnosis not present

## 2019-06-22 ENCOUNTER — Other Ambulatory Visit: Payer: Self-pay

## 2019-06-22 ENCOUNTER — Ambulatory Visit (HOSPITAL_COMMUNITY)
Admission: RE | Admit: 2019-06-22 | Discharge: 2019-06-22 | Disposition: A | Discharge status: Home / Self Care | Payer: Medicare Other | Source: Ambulatory Visit | Attending: Cardiovascular Disease | Admitting: Cardiovascular Disease

## 2019-06-22 ENCOUNTER — Ambulatory Visit (HOSPITAL_BASED_OUTPATIENT_CLINIC_OR_DEPARTMENT_OTHER)
Admission: RE | Admit: 2019-06-22 | Discharge: 2019-06-22 | Disposition: A | Discharge status: Home / Self Care | Payer: Medicare Other | Source: Ambulatory Visit | Attending: Cardiovascular Disease | Admitting: Cardiovascular Disease

## 2019-06-22 DIAGNOSIS — I6523 Occlusion and stenosis of bilateral carotid arteries: Secondary | ICD-10-CM

## 2019-06-22 DIAGNOSIS — I701 Atherosclerosis of renal artery: Secondary | ICD-10-CM | POA: Diagnosis not present

## 2019-06-27 ENCOUNTER — Encounter: Payer: Self-pay | Admitting: Neurology

## 2019-06-27 ENCOUNTER — Ambulatory Visit (INDEPENDENT_AMBULATORY_CARE_PROVIDER_SITE_OTHER): Payer: Medicare Other | Admitting: Neurology

## 2019-06-27 DIAGNOSIS — G301 Alzheimer's disease with late onset: Secondary | ICD-10-CM | POA: Diagnosis not present

## 2019-06-27 DIAGNOSIS — F0280 Dementia in other diseases classified elsewhere without behavioral disturbance: Secondary | ICD-10-CM | POA: Diagnosis not present

## 2019-06-27 DIAGNOSIS — I701 Atherosclerosis of renal artery: Secondary | ICD-10-CM | POA: Diagnosis not present

## 2019-06-27 HISTORY — DX: Dementia in other diseases classified elsewhere, unspecified severity, without behavioral disturbance, psychotic disturbance, mood disturbance, and anxiety: F02.80

## 2019-06-27 NOTE — Progress Notes (Signed)
Reason for visit: Dementia  Donna [name] is an 84 y.o. female  History of present illness:  [name] is an 84 year old right-handed black female with a history of a progressive dementia consistent with Alzheimer's disease.  She has had headaches previously, but over the last year or so, she is no longer having any headaches whatsoever.  She comes in with her husband, her husband indicates that the patient has gone off of the gabapentin, and he does not believe that she is on the Namzaric anymore for memory as it was not helping.  The patient lives at home, the daughter will come in 3 times a week to help out with bathing.  The patient can dress herself if her clothes are placed out.  She is not having any hallucinations, she sleeps well at night and she eats and drinks well.  She is not having any agitation.  The patient cannot find her way around her house, she gets confused as to where things are.  Past Medical History:  Diagnosis Date  . Atherosclerosis of renal artery (HCC)    a. s/p R RA stenting;  b. 04/2014 Renal Art duplex: Patent R RA stent, <60% L RA stenosis. [facility] by [facility].  . Carotid arterial disease ([place])    a. 01/2014 Carotid U/S: RICA 85%, LICA 88%. Followed by [facility].  . CKD (chronic kidney disease), stage III    Stage II/III  . Essential hypertension   . Fibrocystic breast   . Fibromuscular dysplasia ([place])   . Hyperglycemia   . Mitral regurgitation    a. Echo 08/2014: mild MR.  . Paroxysmal atrial fibrillation ([place])    a. Dx 07/26/2014, CHA2DS2VASc = 5-->Eliquis;  c. 08/2014 Echo: EF 55-60%, no rwma, mildly dil LA/RA, mod-sev TR, PASP 61mmHg. b. s/p DCCV 08/2014. c. back in atrial flutter 09/2014 -> rate control pursued.  . Paroxysmal atrial flutter ([place])    a. Dx 07/26/2014, CHA2DS2VASc = 5-->Eliquis;  c. 08/2014 Echo: EF 55-60%, no rwma, mildly dil LA/RA, mod-sev TR, PASP 14mmHg. b. s/p DCCV 08/2014. c. back in atrial flutter 09/2014 -> rate control pursued.  . Tricuspid  regurgitation    a. Echo 08/2014: mod-severe.    Past Surgical History:  Procedure Laterality Date  . CARDIOVERSION N/A 09/01/2014   Procedure: CARDIOVERSION;  Surgeon: Josue Hector, MD;  Location: [facility] ENDOSCOPY;  Service: Cardiovascular;  Laterality: N/A;  . CARDIOVERSION N/A 11/23/2014   Procedure: CARDIOVERSION;  Surgeon: Skeet Latch, MD;  Location: [facility];  Service: Cardiovascular;  Laterality: N/A;  . HEMORROIDECTOMY    . REDUCTION MAMMAPLASTY Bilateral   . RENAL ARTERY STENT  2008   Right renal artery by Dr. Drucie Opitz  . TONSILLECTOMY    . TUBAL LIGATION  1970's    Family History  Problem Relation Age of Onset  . Stroke Mother 39  . Diabetes Mother   . Hypertension Mother   . Aneurysm Father        abdominal aortic aneurysm  . Diabetes Father   . Hypertension Father   . Heart attack Father   . Cancer Sister 34       breast cancer  . Breast cancer Neg Hx     Social history:  reports that she has never smoked. She has never used smokeless tobacco. She reports current alcohol use. She reports that she does not use drugs.    Allergies  Allergen Reactions  . Tagamet [Cimetidine] Hives  . Diovan [Valsartan]  Hair loss    Medications:  Prior to Admission medications   Medication Sig Start Date End Date Taking? Authorizing Provider  acetaminophen (TYLENOL) 650 MG CR tablet Take 650 mg every 8 (eight) hours as needed by mouth for pain.   Yes [provider]  apixaban (ELIQUIS) 2.5 MG TABS tablet Take 1 tablet (2.5 mg total) by mouth 2 (two) times daily. 08/17/17  Yes Burtis Junes, NP  atorvastatin (LIPITOR) 10 MG tablet Take 1 tablet (10 mg total) by mouth daily. 05/09/19 08/07/19 Yes Burtis Junes, NP  diclofenac sodium (VOLTAREN) 1 % GEL Apply 1 application topically daily. 07/31/17  Yes [provider]  dofetilide (TIKOSYN) 125 MCG capsule TAKE 3 CAPSULES BY MOUTH TWICE DAILY 11/25/18  Yes Burtis Junes, NP  furosemide (LASIX) 20  MG tablet Take 1 tablet by mouth once daily 06/21/19  Yes Burtis Junes, NP  gabapentin (NEURONTIN) 100 MG capsule One capsule in the morning and 2 in the evening 12/21/18  Yes Suzzanne Cloud, NP  magnesium oxide (MAG-OX) 400 MG tablet TAKE 1 TABLET BY MOUTH IN THE MORNING 12/27/18  Yes Burtis Junes, NP  Memantine HCl-Donepezil HCl 28-10 MG CP24 Take by mouth.   Yes [provider]  metFORMIN (GLUCOPHAGE-XR) 500 MG 24 hr tablet Take 500 mg by mouth daily. 08/02/17  Yes [provider]  mirtazapine (REMERON) 15 MG tablet Take 15 mg by mouth daily.   Yes [provider]  Multiple Vitamin (MULTIVITAMIN WITH MINERALS) TABS tablet Take 1 tablet by mouth daily.   Yes [provider]  nitroGLYCERIN (NITROSTAT) 0.4 MG SL tablet Place 1 tablet (0.4 mg total) under the tongue every 5 (five) minutes as needed for chest pain. 11/14/14  Yes Dunn, Dayna N, PA-C  potassium chloride SA (KLOR-CON M20) 20 MEQ tablet Take 1 tablet (20 mEq total) by mouth daily. 09/28/14  Yes Dunn, Dayna N, PA-C  vitamin E 400 UNIT capsule Take 400 Units by mouth daily.   Yes [provider]  amLODipine (NORVASC) 10 MG tablet Take 1 tablet (10 mg total) by mouth daily. 11/23/18 03/22/19  Burtis Junes, NP    ROS:  Out of a complete 14 system review of symptoms, the patient complains only of the following symptoms, and all other reviewed systems are negative.  Memory problems   Blood pressure (!) 162/75, pulse 70, weight 122 lb (55.3 kg).  Physical Exam  General: The patient is alert and cooperative at the time of the examination.  Skin: No significant peripheral edema is noted.   Neurologic Exam  Mental status: The patient is alert and oriented x 2 at the time of the examination (not oriented to date). The Mini-Mental Status Examination done today shows a total score 15/30.   Cranial nerves: Facial symmetry is present. Speech is normal, no aphasia or dysarthria is  noted. Extraocular movements are full. Visual fields are full.  Motor: The patient has good strength in all 4 extremities.  Sensory examination: Soft touch sensation is symmetric on the face, arms, and legs.  Coordination: The patient has good finger-nose-finger and heel-to-shin bilaterally, but she does have some apraxia with use of the lower extremities.  Gait and station: The patient has a slightly wide-based gait, stooped posture. Romberg is negative. No drift is seen.  Reflexes: Deep tendon reflexes are symmetric.   Assessment/Plan:  1.  Dementia, Alzheimer's disease  2.  History of headache, resolved  The patient is no longer having  headaches, she has come off the gabapentin.  She is no longer on Namzeric.  We will check back in 1 year, she is not getting any medications through this office.  The husband will contact us if there are any concerns.  He currently reports no safety issues or agitation.   Jill Alexanders MD 06/27/2019 12:49 PM  [facility] [address] [place], [place] [address]  Phone [phone] Fax [phone]

## 2019-07-12 ENCOUNTER — Encounter: Payer: Self-pay | Admitting: *Deleted

## 2019-07-12 ENCOUNTER — Other Ambulatory Visit: Payer: Self-pay | Admitting: *Deleted

## 2019-07-12 NOTE — Patient Outreach (Signed)
[facility]) Care Management [facility] CM Telephone Outreach, routine new referral  07/12/2019  Donna [name] May 09, 1935 [id number]  Telephone outreach to "Donna [name]" daughter/ caregiver for Donna [name], 84 y/o female referred by patient's PCP Dr. Josetta Huddle 06/29/19 for assessment of care/ disease management needs.  Patient has had no recent hospitalizations.  Patient has history including, but not limited to, late onset alzheimer's disease with progressive dementia; HTN; A-Fib; dCHF; CKD-III.  HIPAA compliant voice mail message left for patient, requesting return call back.  Plan:  Will place [facility] CM unsuccessful patient outreach letter in mail requesting call back in writing  Will re-attempt [facility] CM telephone outreach within 4 business days if I do not hear back from patient's caregiver first  Oneta Rack, RN, BSN, [employer] Coordinator [facility] Care Management  [phone]

## 2019-07-12 NOTE — Patient Outreach (Signed)
[facility]) Care Management [facility] CM Telephone Outreach, new referral screening- referral placed for [facility] CSW  07/12/2019  Donna [name] 28-Sep-1935 [id number]  Successful incoming telephone outreach from "Donna [name]" daughter/ caregiver/ HCPOA for Donna [name], 84 y/o female referred by patient's PCP Dr. Josetta [name] 06/29/19 for assessment of community resources/ care/ disease management needs.  Patient has had no recent hospitalizations.  Patient has history including, but not limited to, late onset alzheimer's disease with progressive dementia; HTN; A-Fib; dCHF; CKD-III.  HIPAA/ identity verified with caregiver/ daughter Donna [name] who reports that she is the [facility] for patient; reports that she lives in [place] and is interested in receiving help in getting patient in-home care to make sure patient takes her medications correctly and eats properly, as she has alzheimer's disease/ dementia.  Reports patient lives with her husband who is an alcoholic and he often forgets to give patient her medications and meals, "or he sleeps through it."  Reports that patient and her husband wish to remain at home and decline options around assisted living facilities/ level of care.  Donna [name] shares that patient has another daughter that is local, "Donna [name]" (co-HCPOA) who checks on patient 3-4 times per week and fills a weekly pill box for patient; bathes/ dresses patient/ and provides meals for patient to have on-hand.  Donna [name] reports that she is more able to assist with "the business side" of putting care into place than Donna [name] is, due to Donna [name] working and providing weekly care for patient.  Donna [name] further reports: -- no ongoing medical issues/ no hospitalizations in "4-5" years: "she is healthy and we want to keep her that way." -- currently does not have medicaid: interested in knowing if patient is eligible: personal care services through medicaid discussed with caregiver -- confirmed with Donna [name] that  there is no transportation/ food/ medication insecurity-- "the only problem is that Dad can't remember to do what he is supposed to to take care of her;" Donna [name] confirms strongly several times today that the patient is safe at home despite her report that husband forgets/ sleeps through providing scheduled medications/ meals -- patient requires assistance with ADL's for meal preparation/ medication adherence/ hygiene and dressing -- other in-home care options discussed such as private duty care and PACE program  Discussed with caregiver that I would place [facility] CSW referral to contact her to discuss all of above; caregiver declines need for other care management/ disease management issues stating several times that patient is overall healthy but her daughters are concerned that she is not taking her medications, bathing, eating properly.  Donna [name] also states that she has installed an [place] "ECHO" which allows her to participate in patient's care at home "somewhat," despite living in [place].   I provided/ confirmed that caregiver has my direct phone number, the main [facility] CM office phone number, and the [facility] CM 24-hour nurse advice phone number should issues arise  Plan:  Will place [facility] CSW referral and make patient's PCP aware of same.  Oneta Rack, RN, BSN, [employer] [facility] Care Management  [phone]

## 2019-07-13 NOTE — Addendum Note (Signed)
Addended by: Knox Royalty on: 07/13/2019 10:54 AM   Modules accepted: Orders

## 2019-07-14 ENCOUNTER — Encounter: Payer: Self-pay | Admitting: *Deleted

## 2019-07-14 ENCOUNTER — Other Ambulatory Visit: Payer: Self-pay | Admitting: *Deleted

## 2019-07-14 NOTE — Patient Outreach (Signed)
[facility]) Care Management  07/14/2019  Donna [name] 01/11/1935 [id number]    CSW was able to make initial contact with patient's daughter, [name] today to perform the phone assessment on patient, as well as assess and assist with social work needs and services.  CSW introduced self, explained role and types of services provided through [place] [facility] ([place] [facility]).  CSW further explained to Mrs. Marshell Levan that CSW works with patient's RNCM, also with [facility], Reginia Naas.  CSW then explained the reason for the call, indicating that Mrs. Tousey thought that patient would benefit from social work services and resources to assist with arranging in-home care services, as well as assistance with applying for Adult Medicaid.  CSW obtained two HIPAA compliant identifiers from Mrs. Marshell Levan, which included patient's name and date of birth.  CSW first inquired about patient and husband, [name] Obi's combined monthly income, automatically learning that they are both well-overqualified to be eligible for Adult Medicaid, through the [place].  With that being said, CSW explained to Mrs. Marshell Levan that patient would not be able to apply for [facility]), through the [place], or [facility] Forensic scientist), through the [place], as both programs are funded by [employer].  CSW then inquired as to whether or not patient and/or Mr. Mcgonagle are retired Publishing rights manager.  Mrs. Marshell Levan reported that Mr. Govea is a retired English as a second language teacher, but his honorable discharge date was prior to him marrying patient.  CSW then spoke with Mrs. Marshell Levan about the various Adult Day Care Programs that may be of interest to patient, which includes [facility] ([facility]) and PACE (Program of [place] for the  Elderly), agreeing to email Mrs. Bryant ([email or web address]) information.  CSW also spoke with Mrs. Marshell Levan about the various in-home care and respite agencies, as well as private agency sitters, agreeing to email Mrs. Marshell Levan this list of resources, as well.  Prior to terminating the call, CSW was able to confirm that Mrs. Marshell Levan received the resource information emailed to her by CSW, in addition to reviewing the list of resources with Mrs. Marshell Levan and answering pertinent questions.  Mrs. Marshell Levan admitted that she is not ready to consider placement for patient, of any kind, whether it be assisted living or skilled nursing.    CSW is aware that patient has been diagnosed with Alzheimer's/Dementia, but Mrs. Marshell Levan reported that patient is still able to perform all activities of daily living independently.  However, patient does rely on Mr. Jalloh to administer her prescription medications, as well as prepare her meals, as patient has been encouraged not to use the oven or stove.  Mrs. Marshell Levan admitted that patient has another daughter, Cyndia Skeeters that lives nearby, so Mrs. Marshell Levan plans to enlist her support with caring for patient in the home.  CSW inquired as to whether or not Mrs. Marshell Levan would like for CSW to assist her with getting patient enrolled into an adult day care program, or assist her with arranging in-home care services for patient, but Mrs. Marshell Levan denied, indicating that she needs to discuss everything with patient and Mrs. Ratliffe, before proceeding any further.  CSW voiced understanding and was agreeable to this plan, ensuring that Mrs. Marshell Levan has the correct contact information for CSW.  CSW encouraged Mrs. Marshell Levan to contact CSW directly if she changes her mind about wanting to receive  assistance, or if additional social work needs arise in the near future.  CSW will perform a case closure on patient, as all goals of treatment have been met from social work standpoint and no additional social  work needs have been identified at this time.  CSW will notify patient's RNCM with [place] [facility], Reginia Naas of CSW's plans to close patient's case.  CSW will fax an update to patient's Primary Care Physician, Dr. Josetta Huddle to ensure that he is aware of CSW's involvement with patient's plan of care, in addition to routing a Physician Case Closure Letter.    Nat Christen, BSW, MSW, LCSW  Licensed Education officer, [facility]  Mailing [place] [address], [place], [place] [address] Physical Address-[address], [place], [place] [address] Toll Free Main # [phone] Fax # [phone] Cell # [phone]  Office # [phone] [email or web address]

## 2019-07-22 ENCOUNTER — Telehealth: Payer: Self-pay | Admitting: Nurse Practitioner

## 2019-07-22 NOTE — Telephone Encounter (Signed)
New Message   Pt c/o medication issue:  1. Name of Medication: Carvedilol   2. How are you currently taking this medication (dosage and times per day)? She has not been taking this medication   3. Are you having a reaction (difficulty breathing--STAT)? No   4. What is your medication issue? Pts daughter makes the patients medication tray for her and she says this medication she has not been supplying for her. She thinks she is missing this medication and is wondering if she is suppose to be taking it   Please call

## 2019-07-22 NOTE — Telephone Encounter (Signed)
Pts daughter advised the Coreg was stopped at her 11/23/18 office visit.

## 2019-08-17 DIAGNOSIS — I480 Paroxysmal atrial fibrillation: Secondary | ICD-10-CM | POA: Diagnosis not present

## 2019-08-17 DIAGNOSIS — I509 Heart failure, unspecified: Secondary | ICD-10-CM | POA: Diagnosis not present

## 2019-08-17 DIAGNOSIS — E119 Type 2 diabetes mellitus without complications: Secondary | ICD-10-CM | POA: Diagnosis not present

## 2019-08-17 DIAGNOSIS — I701 Atherosclerosis of renal artery: Secondary | ICD-10-CM | POA: Diagnosis not present

## 2019-08-17 DIAGNOSIS — I4891 Unspecified atrial fibrillation: Secondary | ICD-10-CM | POA: Diagnosis not present

## 2019-08-17 DIAGNOSIS — E782 Mixed hyperlipidemia: Secondary | ICD-10-CM | POA: Diagnosis not present

## 2019-08-17 DIAGNOSIS — I10 Essential (primary) hypertension: Secondary | ICD-10-CM | POA: Diagnosis not present

## 2019-08-17 DIAGNOSIS — F341 Dysthymic disorder: Secondary | ICD-10-CM | POA: Diagnosis not present

## 2019-08-17 DIAGNOSIS — F0390 Unspecified dementia without behavioral disturbance: Secondary | ICD-10-CM | POA: Diagnosis not present

## 2019-09-17 ENCOUNTER — Other Ambulatory Visit: Payer: Self-pay | Admitting: Nurse Practitioner

## 2019-09-19 NOTE — Progress Notes (Signed)
CARDIOLOGY OFFICE NOTE  Date:  09/27/2019    Donna [name] Date of Birth: December 13, 1935 Medical Record #[id number]  PCP:  Josetta Huddle, MD  Cardiologist:  [facility]  Chief Complaint  Patient presents with  . Follow-up    History of Present Illness: Donna [name] is a 84 y.o. female who presents today for a 6 month check. Seen for Dr. Tamala Julian.   She has a history of atherosclerotic kidney disease, hypertension, PAF - on Tikosyn, chronic diastolic heart failure when in A. fib, and chronic anticoagulation therapy with Eliquis.  Last seen by Dr. Huntley Estelle of 2017. Was doing ok at that visit.Then lost to follow up until February of2019- thenseen by me and I have followed her since - medicines very unclearand this has been a recurrent theme.Ihavealso suspectedmedication non compliance as well. She has had 1st degree AV block - Coreg has been cut back and subsequently stopped. Last seen back in March - was doing ok - they had both been vaccinated. Needs patient assistance for her Tikosyn and Eliquis. Felt to be holding her own.   Comes in today. Here with her husband - he augments the history. She feels "wonderful" - not short of breath. No chest pain. Not dizzy or lightheaded. He is asking her dose of Tikosyn - says she is taking - then says that sometimes she misses her night time medicines - this happens at least a few times a week. She notes no palpitations. No passing out. She feels like she is well. They are both staying in due to the pandemic. She has been off Coreg since November of 2020.   Past Medical History:  Diagnosis Date  . Alzheimer disease ([place]) 06/27/2019  . Atherosclerosis of renal artery (HCC)    a. s/p R RA stenting;  b. 04/2014 Renal Art duplex: Patent R RA stent, <60% L RA stenosis. Followed by [facility].  . Carotid arterial disease ([place])    a. 01/2014 Carotid U/S: RICA 51%, LICA 70%. Followed by [facility].  . CKD (chronic kidney disease), stage III      Stage II/III  . Essential hypertension   . Fibrocystic breast   . Fibromuscular dysplasia ([place])   . Hyperglycemia   . Mitral regurgitation    a. Echo 08/2014: mild MR.  . Paroxysmal atrial fibrillation ([place])    a. Dx 07/26/2014, CHA2DS2VASc = 5-->Eliquis;  c. 08/2014 Echo: EF 55-60%, no rwma, mildly dil LA/RA, mod-sev TR, PASP 16mmHg. b. s/p DCCV 08/2014. c. back in atrial flutter 09/2014 -> rate control pursued.  . Paroxysmal atrial flutter ([place])    a. Dx 07/26/2014, CHA2DS2VASc = 5-->Eliquis;  c. 08/2014 Echo: EF 55-60%, no rwma, mildly dil LA/RA, mod-sev TR, PASP 34mmHg. b. s/p DCCV 08/2014. c. back in atrial flutter 09/2014 -> rate control pursued.  . Tricuspid regurgitation    a. Echo 08/2014: mod-severe.    Past Surgical History:  Procedure Laterality Date  . CARDIOVERSION N/A 09/01/2014   Procedure: CARDIOVERSION;  Surgeon: Josue Hector, MD;  Location: [facility] ENDOSCOPY;  Service: Cardiovascular;  Laterality: N/A;  . CARDIOVERSION N/A 11/23/2014   Procedure: CARDIOVERSION;  Surgeon: Skeet Latch, MD;  Location: [facility];  Service: Cardiovascular;  Laterality: N/A;  . HEMORROIDECTOMY    . REDUCTION MAMMAPLASTY Bilateral   . RENAL ARTERY STENT  2008   Right renal artery by Dr. Drucie Opitz  . TONSILLECTOMY    . TUBAL LIGATION  1970's     Medications: Current  Meds  Medication Sig  . acetaminophen (TYLENOL) 650 MG CR tablet Take 650 mg every 8 (eight) hours as needed by mouth for pain.  Marland Kitchen apixaban (ELIQUIS) 2.5 MG TABS tablet Take 1 tablet (2.5 mg total) by mouth 2 (two) times daily.  . diclofenac sodium (VOLTAREN) 1 % GEL Apply 1 application topically daily.  Marland Kitchen dofetilide (TIKOSYN) 125 MCG capsule TAKE 3 CAPSULES BY MOUTH TWICE DAILY  . fluticasone (FLONASE) 50 MCG/ACT nasal spray   . furosemide (LASIX) 20 MG tablet Take 1 tablet by mouth once daily  . magnesium oxide (MAG-OX) 400 MG tablet Take 1 tablet (400 mg total) by mouth every morning.  . metFORMIN (GLUCOPHAGE-XR) 500 MG  24 hr tablet Take 500 mg by mouth daily.  . mirtazapine (REMERON) 15 MG tablet Take 15 mg by mouth daily.  . mirtazapine (REMERON) 30 MG tablet   . Multiple Vitamin (MULTIVITAMIN WITH MINERALS) TABS tablet Take 1 tablet by mouth daily.  Marland Kitchen NAMZARIC 28-10 MG CP24   . nitroGLYCERIN (NITROSTAT) 0.4 MG SL tablet Place 1 tablet (0.4 mg total) under the tongue every 5 (five) minutes as needed for chest pain.  . potassium chloride SA (KLOR-CON M20) 20 MEQ tablet Take 1 tablet (20 mEq total) by mouth daily.  Marland Kitchen SHINGRIX injection   . vitamin E 400 UNIT capsule Take 400 Units by mouth daily.     Allergies: Allergies  Allergen Reactions  . Tagamet [Cimetidine] Hives  . Diovan [Valsartan]     Hair loss    Social History: The patient  reports that she has never smoked. She has never used smokeless tobacco. She reports current alcohol use. She reports that she does not use drugs.   Family History: The patient's family history includes Aneurysm in her father; Cancer (age of onset: 55) in her sister; Diabetes in her father and mother; Heart attack in her father; Hypertension in her father and mother; Stroke (age of onset: 57) in her mother.   Review of Systems: Please see the history of present illness.   All other systems are reviewed and negative.   Physical Exam: VS:  BP 116/80   Pulse (!) 114   Ht 5\' 2"  (1.575 m)   Wt 122 lb 3.2 oz (55.4 kg)   SpO2 97%   BMI 22.35 kg/m  .  BMI Body mass index is 22.35 kg/m.  Wt Readings from Last 3 Encounters:  09/27/19 122 lb 3.2 oz (55.4 kg)  06/27/19 122 lb (55.3 kg)  03/22/19 124 lb 8 oz (56.5 kg)    General: Pleasant. Alert and in no acute distress.   Cardiac: Regular rate and rhythm but HR is elevated. No murmurs, rubs, or gallops. No edema.  Respiratory:  Lungs are clear to auscultation bilaterally with normal work of breathing.  GI: Soft and nontender.  MS: No deformity or atrophy. Gait and ROM intact.  Skin: Warm and dry. Color is  normal.  Neuro:  Strength and sensation are intact and no gross focal deficits noted.  Psych: Alert, appropriate and with normal affect.   LABORATORY DATA:  EKG:  EKG is ordered today.  Personally reviewed by me. This looks to show sinus with very prolonged 1st degree AV block versus AIVR - HR is 114 today.   Lab Results  Component Value Date   WBC 4.9 03/22/2019   HGB 15.2 03/22/2019   HCT 44.1 03/22/2019   PLT 207 03/22/2019   GLUCOSE 141 (H) 03/22/2019   CHOL 145 05/03/2019  TRIG 93 05/03/2019   HDL 52 05/03/2019   LDLCALC 76 05/03/2019   ALT 16 05/03/2019   AST 18 05/03/2019   NA 140 03/22/2019   K 4.4 03/22/2019   CL 104 03/22/2019   CREATININE 1.13 (H) 03/22/2019   BUN 17 03/22/2019   CO2 22 03/22/2019   TSH 0.443 11/19/2014   INR 1.17 02/15/2018   HGBA1C 5.8 (H) 08/31/2015       BNP (last 3 results) No results for input(s): BNP in the last 8760 hours.  ProBNP (last 3 results) No results for input(s): PROBNP in the last 8760 hours.   Other Studies Reviewed Today:  Carotid Duplex Summary 06/2019:  Right Carotid: Velocities in the right ICA are consistent with a 1-39%  stenosis.   Left Carotid: Velocities in the left ICA are consistent with a 1-39%  stenosis.   Vertebrals: Bilateral vertebral arteries demonstrate antegrade flow.  Subclavians: Normal flow hemodynamics were seen in bilateral subclavian        arteries.   *See table(s) above for measurements and observations.      Electronically signed by Ena Dawley MD on 06/22/2019 at 11:44:38 AM.    Summary:  Largest Aortic Diameter: 1.4 cm    Renal Duplex 06/2019:    Right: Evidence of a greater than 60% stenosis of the right renal     artery, essentially stable when compared to previous exam.     RRV flow present. Abnormal size for the right kidney. Normal     right Resisitive Index. Normal cortical thickness of right     kidney.  Left: Evidence of a > 60%  stenosis in the left renal artery,     essentially stable when compared to the previous exam. LRV     flow present. Normal size of left kidney. Normal left     Resistive Index. Normal cortical thickness of the left     kidney.  Mesenteric:  Normal Celiac artery and Superior Mesenteric artery findings. Areas of  limited  visceral study include right kidney size, right renal artery and left  renal  artery.    *See table(s) above for measurements and observations.    Suggest follow up study in 12 months.   MyoviewStudy Highlights2016   There was no ST segment deviation noted during stress.  This is a low risk study.  Perfusion imaging shows no evidence of ischemia or scar. Study was not gated because of the atrial fibrillation. Consider echocardiogram to assess LV systolic function if needed.   EchoStudy Conclusions2016  - Left ventricle: The cavity size was normal. Wall thickness was increased in a pattern of mild LVH. Systolic function was normal. The estimated ejection fraction was in the range of 55% to 60%. Wall motion was normal; there were no regional wall motion abnormalities. - Mitral valve: There was mild regurgitation. - Left atrium: The atrium was mildly dilated. - Right atrium: The atrium was mildly dilated. - Tricuspid valve: There was moderate-severe regurgitation. - Pulmonary arteries: Systolic pressure was mildly increased. PA peak pressure: 39 mm Hg (S). - Pericardium, extracardiac: A trivial pericardial effusion was identified.    Assessment/Plan:  1. PAF - on Tikosyn and Eliquis - doubt she is taking either as recommended - this is worrisome. HR is elevated today - adding Toprol QD.   2. Chronic 1st degree AV block - history of bradycardia - now HR is faster - will try adding low dose beta blocker back - see back with EKG - may  need to consider Holter. She is not symptomatic.   3. HTN - BP ok here today.    4. Chronic anticoagulation - no problems noted. Still worry about compliance.   5. Chronic diastolic dysfunction - seems very well compensated.   6. PAD - has had her studies updated.   7. Valvular heart disease - last echo noted - would follow.  She is not symptomatic.   Current medicines are reviewed with the patient today.  The patient does not have concerns regarding medicines other than what has been noted above.  The following changes have been made:  See above.  Labs/ tests ordered today include:    Orders Placed This Encounter  Procedures  . Basic metabolic panel  . Magnesium  . EKG 12-Lead     Disposition:   FU with me in about 2 weeks with repeat EKG.    Patient is agreeable to this plan and will call if any problems develop in the interim.   SignedTruitt Merle, NP  09/27/2019 12:07 PM  [facility] [address] [place], [place]  [address] Phone: [phone] Fax: [phone]

## 2019-09-21 ENCOUNTER — Other Ambulatory Visit: Payer: Self-pay

## 2019-09-21 MED ORDER — MAGNESIUM OXIDE 400 MG PO TABS: 1.0000 | ORAL_TABLET | Freq: Every morning | ORAL | 1 refills | Status: DC

## 2019-09-22 DIAGNOSIS — Z7984 Long term (current) use of oral hypoglycemic drugs: Secondary | ICD-10-CM | POA: Diagnosis not present

## 2019-09-22 DIAGNOSIS — E119 Type 2 diabetes mellitus without complications: Secondary | ICD-10-CM | POA: Diagnosis not present

## 2019-09-22 DIAGNOSIS — Z23 Encounter for immunization: Secondary | ICD-10-CM | POA: Diagnosis not present

## 2019-09-27 ENCOUNTER — Other Ambulatory Visit: Payer: Self-pay

## 2019-09-27 ENCOUNTER — Encounter: Payer: Self-pay | Admitting: Nurse Practitioner

## 2019-09-27 ENCOUNTER — Ambulatory Visit (INDEPENDENT_AMBULATORY_CARE_PROVIDER_SITE_OTHER): Payer: Medicare Other | Admitting: Nurse Practitioner

## 2019-09-27 VITALS — BP 116/80 | HR 114 | Ht 62.0 in | Wt 122.2 lb

## 2019-09-27 DIAGNOSIS — I739 Peripheral vascular disease, unspecified: Secondary | ICD-10-CM

## 2019-09-27 DIAGNOSIS — I701 Atherosclerosis of renal artery: Secondary | ICD-10-CM | POA: Diagnosis not present

## 2019-09-27 DIAGNOSIS — Z79899 Other long term (current) drug therapy: Secondary | ICD-10-CM

## 2019-09-27 DIAGNOSIS — I480 Paroxysmal atrial fibrillation: Secondary | ICD-10-CM | POA: Diagnosis not present

## 2019-09-27 DIAGNOSIS — I440 Atrioventricular block, first degree: Secondary | ICD-10-CM | POA: Diagnosis not present

## 2019-09-27 DIAGNOSIS — Z7901 Long term (current) use of anticoagulants: Secondary | ICD-10-CM | POA: Diagnosis not present

## 2019-09-27 LAB — BASIC METABOLIC PANEL
BUN/Creatinine Ratio: 18 (ref 12–28)
BUN: 18 mg/dL (ref 8–27)
CO2: 23 mmol/L (ref 20–29)
Calcium: 10.7 mg/dL — ABNORMAL HIGH (ref 8.7–10.3)
Chloride: 110 mmol/L — ABNORMAL HIGH (ref 96–106)
Creatinine, Ser: 1.02 mg/dL — ABNORMAL HIGH (ref 0.57–1.00)
GFR calc Af Amer: 59 mL/min/{1.73_m2} — ABNORMAL LOW (ref >59)
GFR calc non Af Amer: 51 mL/min/{1.73_m2} — ABNORMAL LOW (ref >59)
Glucose: 117 mg/dL — ABNORMAL HIGH (ref 65–99)
Potassium: 3.7 mmol/L (ref 3.5–5.2)
Sodium: 144 mmol/L (ref 134–144)

## 2019-09-27 LAB — MAGNESIUM: Magnesium: 2.1 mg/dL (ref 1.6–2.3)

## 2019-09-27 MED ORDER — METOPROLOL SUCCINATE ER 25 MG PO TB24: 25.0000 mg | ORAL_TABLET | Freq: Every day | ORAL | 3 refills | Status: DC

## 2019-09-27 NOTE — Patient Instructions (Addendum)
After Visit Summary:  We will be checking the following labs today - BMET & Mag level   Medication Instructions:    Continue with your current medicines. BUT  I am adding Toprol 25 mg to take once a day - this is at the pharmacy - [facility].    If you need a refill on your cardiac medications before your next appointment, please call your pharmacy.     Testing/Procedures To Be Arranged:  N/A  Follow-Up:   See me in about 2 weeks with repeat EKG    At [facility], you and your health needs are our priority.  As part of our continuing mission to provide you with exceptional heart care, we have created designated Provider Care Teams.  These Care Teams include your primary Cardiologist (physician) and Advanced Practice Providers (APPs -  Physician Assistants and Nurse Practitioners) who all work together to provide you with the care you need, when you need it.  Special Instructions:  . Stay safe, wash your hands for at least 20 seconds and wear a mask when needed.  . It was good to talk with you both today.    Call the [place] office at [phone] if you have any questions, problems or concerns.

## 2019-10-03 NOTE — Progress Notes (Signed)
CARDIOLOGY OFFICE NOTE  Date:  10/12/2019      Donna [name] Date of Birth: [birth year] Medical Record #[id number]  PCP:  Donna Huddle, MD  Cardiologist:  Donna [name]   Chief Complaint  Patient presents with  . Follow-up    Seen for Donna [name]    History of Present Illness: Donna [name] is a 84 y.o. female who presents today for a follow up visit.  Seen for Donna [name].   She has a history of atherosclerotic kidney disease, hypertension,PAF - on Tikosyn, chronic diastolic heart failure when in A. fib, and chronic anticoagulation therapywith Eliquis.  Last seen by Dr. Huntley [name] of 2017. Was doing ok at that visit.Then lost to follow up until February of2019- thenseen by Donna [name] I have followed her since- medicines very unclearand this has been a recurrent theme.Ihavealso suspectedmedication non compliance as well.She has had 1st degree AV block - Donna [name] has been cut back and subsequently stopped. When seen back in March - was doing ok - they had both been vaccinated. Needs patient assistance for her Tikosyn and Eliquis. Felt to be holding her own.   I saw her a few weeks ago - she was feeling good - but HR was higher - off Donna [name] since November - we elected to try low dose Toprol.   Comes in today. Here with her husband. She still feels good. No chest pain. Not dizzy or lightheaded. Not short of breath. Tolerating her medicines without issue.   Past Medical History:  Diagnosis Date  . Alzheimer disease ([place]) 06/27/2019  . Atherosclerosis of renal artery (HCC)    a. s/p R RA stenting;  b. 04/2014 Renal Art duplex: Patent R RA stent, <60% L RA stenosis. Followed by [facility].  . Carotid arterial disease ([place])    a. 01/2014 Carotid U/S: RICA 91%, LICA 63%. Followed by [facility].  . CKD (chronic kidney disease), stage III (HCC)    Stage II/III  . Essential hypertension   . Fibrocystic breast   . Fibromuscular dysplasia ([place])   . Hyperglycemia   . Mitral  regurgitation    a. Echo 08/2014: mild MR.  . Paroxysmal atrial fibrillation ([place])    a. Dx 07/26/2014, CHA2DS2VASc = 5-->Eliquis;  c. 08/2014 Echo: EF 55-60%, no rwma, mildly dil LA/RA, mod-sev TR, PASP 76mmHg. b. s/p DCCV 08/2014. c. back in atrial flutter 09/2014 -> rate control pursued.  . Paroxysmal atrial flutter ([place])    a. Dx 07/26/2014, CHA2DS2VASc = 5-->Eliquis;  c. 08/2014 Echo: EF 55-60%, no rwma, mildly dil LA/RA, mod-sev TR, PASP 20mmHg. b. s/p DCCV 08/2014. c. back in atrial flutter 09/2014 -> rate control pursued.  . Tricuspid regurgitation    a. Echo 08/2014: mod-severe.    Past Surgical History:  Procedure Laterality Date  . CARDIOVERSION N/A 09/01/2014   Procedure: CARDIOVERSION;  Surgeon: Josue Hector, MD;  Location: [facility] ENDOSCOPY;  Service: Cardiovascular;  Laterality: N/A;  . CARDIOVERSION N/A 11/23/2014   Procedure: CARDIOVERSION;  Surgeon: Skeet Latch, MD;  Location: [facility];  Service: Cardiovascular;  Laterality: N/A;  . HEMORROIDECTOMY    . REDUCTION MAMMAPLASTY Bilateral   . RENAL ARTERY STENT  2008   Right renal artery by Dr. Drucie Opitz  . TONSILLECTOMY    . TUBAL LIGATION  1970's     Medications: Current Meds  Medication Sig  . acetaminophen (TYLENOL) 650 MG CR tablet Take 650 mg every 8 (eight) hours as needed by mouth for pain.  Marland Kitchen  apixaban (ELIQUIS) 2.5 MG TABS tablet Take 1 tablet (2.5 mg total) by mouth 2 (two) times daily.  . diclofenac sodium (VOLTAREN) 1 % GEL Apply 1 application topically daily.  Marland Kitchen dofetilide (TIKOSYN) 125 MCG capsule TAKE 3 CAPSULES BY MOUTH TWICE DAILY  . fluticasone (FLONASE) 50 MCG/ACT nasal spray   . furosemide (LASIX) 20 MG tablet Take 1 tablet by mouth once daily  . magnesium oxide (MAG-OX) 400 MG tablet Take 1 tablet (400 mg total) by mouth every morning.  . metFORMIN (GLUCOPHAGE-XR) 500 MG 24 hr tablet Take 500 mg by mouth daily.  . metoprolol succinate (TOPROL XL) 25 MG 24 hr tablet Take 0.5 tablets (12.5 mg total) by  mouth daily.  . mirtazapine (REMERON) 15 MG tablet Take 15 mg by mouth daily.  . mirtazapine (REMERON) 30 MG tablet   . Multiple Vitamin (MULTIVITAMIN WITH MINERALS) TABS tablet Take 1 tablet by mouth daily.  Marland Kitchen NAMZARIC 28-10 MG CP24   . nitroGLYCERIN (NITROSTAT) 0.4 MG SL tablet Place 1 tablet (0.4 mg total) under the tongue every 5 (five) minutes as needed for chest pain.  . potassium chloride SA (KLOR-CON M20) 20 MEQ tablet Take 1 tablet (20 mEq total) by mouth daily.  Marland Kitchen SHINGRIX injection   . vitamin E 400 UNIT capsule Take 400 Units by mouth daily.  . [DISCONTINUED] metoprolol succinate (TOPROL XL) 25 MG 24 hr tablet Take 1 tablet (25 mg total) by mouth daily.     Allergies: Allergies  Allergen Reactions  . Tagamet [Cimetidine] Hives  . Diovan [Valsartan]     Hair loss    Social History: The patient  reports that she has never smoked. She has never used smokeless tobacco. She reports current alcohol use. She reports that she does not use drugs.   Family History: The patient's family history includes Aneurysm in her father; Cancer (age of onset: 73) in her sister; Diabetes in her father and mother; Heart attack in her father; Hypertension in her father and mother; Stroke (age of onset: 47) in her mother.   Review of Systems: Please see the history of present illness.   All other systems are reviewed and negative.   Physical Exam: VS:  BP 120/70   Pulse (!) 59   Ht 5\' 2"  (1.575 m)   Wt 120 lb 12.8 oz (54.8 kg)   SpO2 97%   BMI 22.09 kg/m  .  BMI Body mass index is 22.09 kg/m.  Wt Readings from Last 3 Encounters:  10/12/19 120 lb 12.8 oz (54.8 kg)  09/27/19 122 lb 3.2 oz (55.4 kg)  06/27/19 122 lb (55.3 kg)    General: Alert and in no acute distress.   Cardiac: Regular rate and rhythm. No murmurs, rubs, or gallops. No edema.  Respiratory:  Lungs are clear to auscultation bilaterally with normal work of breathing.  GI: Soft and nontender.  MS: No deformity or  atrophy. Gait and ROM intact.  Skin: Warm and dry. Color is normal.  Neuro:  Strength and sensation are intact and no gross focal deficits noted.  Psych: Alert, appropriate and with normal affect.   LABORATORY DATA:  EKG:  EKG is ordered today.  Personally reviewed by me. This demonstrates sinus bradycardia - HR is 59 - she has 1st degree AV block.  Lab Results  Component Value Date   WBC 4.9 03/22/2019   HGB 15.2 03/22/2019   HCT 44.1 03/22/2019   PLT 207 03/22/2019   GLUCOSE 117 (H) 09/27/2019  CHOL 145 05/03/2019   TRIG 93 05/03/2019   HDL 52 05/03/2019   LDLCALC 76 05/03/2019   ALT 16 05/03/2019   AST 18 05/03/2019   NA 144 09/27/2019   K 3.7 09/27/2019   CL 110 (H) 09/27/2019   CREATININE 1.02 (H) 09/27/2019   BUN 18 09/27/2019   CO2 23 09/27/2019   TSH 0.443 11/19/2014   INR 1.17 02/15/2018   HGBA1C 5.8 (H) 08/31/2015    BNP (last 3 results) No results for input(s): BNP in the last 8760 hours.  ProBNP (last 3 results) No results for input(s): PROBNP in the last 8760 hours.   Other Studies Reviewed Today:  Carotid Duplex Summary 06/2019:  Right Carotid: Velocities in the right ICA are consistent with a 1-39%  stenosis.   Left Carotid: Velocities in the left ICA are consistent with a 1-39%  stenosis.   Vertebrals: Bilateral vertebral arteries demonstrate antegrade flow.  Subclavians: Normal flow hemodynamics were seen in bilateral subclavian        arteries.   *See table(s) above for measurements and observations.      Electronically signed by Ena Dawley MD on 06/22/2019 at 11:44:38 AM.    Summary:  Largest Aortic Diameter: 1.4 cm    Renal Duplex 06/2019:    Right: Evidence of a greater than 60% stenosis of the right renal     artery, essentially stable when compared to previous exam.     RRV flow present. Abnormal size for the right kidney. Normal     right Resisitive Index. Normal cortical thickness of right      kidney.  Left: Evidence of a > 60% stenosis in the left renal artery,     essentially stable when compared to the previous exam. LRV     flow present. Normal size of left kidney. Normal left     Resistive Index. Normal cortical thickness of the left     kidney.  Mesenteric:  Normal Celiac artery and Superior Mesenteric artery findings. Areas of  limited  visceral study include right kidney size, right renal artery and left  renal  artery.    *See table(s) above for measurements and observations.    Suggest follow up study in 12 months.   MyoviewStudy Highlights2016   There was no ST segment deviation noted during stress.  This is a low risk study.  Perfusion imaging shows no evidence of ischemia or scar. Study was not gated because of the atrial fibrillation. Consider echocardiogram to assess LV systolic function if needed.   EchoStudy Conclusions2016  - Left ventricle: The cavity size was normal. Wall thickness was increased in a pattern of mild LVH. Systolic function was normal. The estimated ejection fraction was in the range of 55% to 60%. Wall motion was normal; there were no regional wall motion abnormalities. - Mitral valve: There was mild regurgitation. - Left atrium: The atrium was mildly dilated. - Right atrium: The atrium was mildly dilated. - Tricuspid valve: There was moderate-severe regurgitation. - Pulmonary arteries: Systolic pressure was mildly increased. PA peak pressure: 39 mm Hg (S). - Pericardium, extracardiac: A trivial pericardial effusion was identified.    Assessment/Plan:  1. PAF - on Tikosyn and Eliquis - her medicine compliance is always a little doubtful.   2. Prior tachycardia - ?AIVR - now back on some low dose Toprol - will cut this in half today.   3. Chronic 1st degree AV block - see #2.   4. HTN - BP is fine  here today - no changes made.   5. Chronic anticoagulation - no problems  noted.   6. Chronic diastolic dysfunction  7. Valvular heart disease - would follow - not symptomatic.   Current medicines are reviewed with the patient today.  The patient does not have concerns regarding medicines other than what has been noted above.  The following changes have been made:  See above.  Labs/ tests ordered today include:    Orders Placed This Encounter  Procedures  . EKG 12-Lead     Disposition:   FU with [place] in 3 months - Toprol is cut back today to 12.5 mg a day - EKG on return.   Patient is agreeable to this plan and will call if any problems develop in the interim.   SignedTruitt Merle, NP  10/12/2019 11:57 AM  [facility] [address] [place], [place]  [address] Phone: [phone] Fax: [phone]

## 2019-10-12 ENCOUNTER — Encounter: Payer: Self-pay | Admitting: Nurse Practitioner

## 2019-10-12 ENCOUNTER — Ambulatory Visit (INDEPENDENT_AMBULATORY_CARE_PROVIDER_SITE_OTHER): Payer: Medicare Other | Admitting: Nurse Practitioner

## 2019-10-12 ENCOUNTER — Other Ambulatory Visit: Payer: Self-pay

## 2019-10-12 VITALS — BP 120/70 | HR 59 | Ht 62.0 in | Wt 120.8 lb

## 2019-10-12 DIAGNOSIS — Z7901 Long term (current) use of anticoagulants: Secondary | ICD-10-CM | POA: Diagnosis not present

## 2019-10-12 DIAGNOSIS — Z79899 Other long term (current) drug therapy: Secondary | ICD-10-CM

## 2019-10-12 DIAGNOSIS — I440 Atrioventricular block, first degree: Secondary | ICD-10-CM

## 2019-10-12 DIAGNOSIS — I480 Paroxysmal atrial fibrillation: Secondary | ICD-10-CM | POA: Diagnosis not present

## 2019-10-12 DIAGNOSIS — I701 Atherosclerosis of renal artery: Secondary | ICD-10-CM | POA: Diagnosis not present

## 2019-10-12 MED ORDER — METOPROLOL SUCCINATE ER 25 MG PO TB24: 12.5000 mg | ORAL_TABLET | Freq: Every day | ORAL | 3 refills | Status: DC

## 2019-10-12 NOTE — Patient Instructions (Signed)
After Visit Summary:  We will be checking the following labs today - NONE   Medication Instructions:    Continue with your current medicines. BUT  Cut the Toprol (Metoprolol) in half - take just 1/2 a pill once a day   If you need a refill on your cardiac medications before your next appointment, please call your pharmacy.     Testing/Procedures To Be Arranged:  N/A  Follow-Up:   See me in about 3 months with EKG    At [facility], you and your health needs are our priority.  As part of our continuing mission to provide you with exceptional heart care, we have created designated Provider Care Teams.  These Care Teams include your primary Cardiologist (physician) and Advanced Practice Providers (APPs -  Physician Assistants and Nurse Practitioners) who all work together to provide you with the care you need, when you need it.  Special Instructions:  . Stay safe, wash your hands for at least 20 seconds and wear a mask when needed.  . It was good to talk with you today.    Call the [place] office at [phone] if you have any questions, problems or concerns.

## 2019-11-02 DIAGNOSIS — F0390 Unspecified dementia without behavioral disturbance: Secondary | ICD-10-CM | POA: Diagnosis not present

## 2019-11-02 DIAGNOSIS — E782 Mixed hyperlipidemia: Secondary | ICD-10-CM | POA: Diagnosis not present

## 2019-11-02 DIAGNOSIS — E119 Type 2 diabetes mellitus without complications: Secondary | ICD-10-CM | POA: Diagnosis not present

## 2019-11-02 DIAGNOSIS — I10 Essential (primary) hypertension: Secondary | ICD-10-CM | POA: Diagnosis not present

## 2019-11-02 DIAGNOSIS — F341 Dysthymic disorder: Secondary | ICD-10-CM | POA: Diagnosis not present

## 2019-11-03 DIAGNOSIS — Z23 Encounter for immunization: Secondary | ICD-10-CM | POA: Diagnosis not present

## 2019-11-15 DIAGNOSIS — J302 Other seasonal allergic rhinitis: Secondary | ICD-10-CM | POA: Diagnosis not present

## 2019-11-26 ENCOUNTER — Other Ambulatory Visit: Payer: Self-pay | Admitting: Nurse Practitioner

## 2019-11-26 DIAGNOSIS — I480 Paroxysmal atrial fibrillation: Secondary | ICD-10-CM

## 2020-01-05 DIAGNOSIS — F0390 Unspecified dementia without behavioral disturbance: Secondary | ICD-10-CM | POA: Diagnosis not present

## 2020-01-05 DIAGNOSIS — I509 Heart failure, unspecified: Secondary | ICD-10-CM | POA: Diagnosis not present

## 2020-01-05 DIAGNOSIS — D6869 Other thrombophilia: Secondary | ICD-10-CM | POA: Diagnosis not present

## 2020-01-05 DIAGNOSIS — I4891 Unspecified atrial fibrillation: Secondary | ICD-10-CM | POA: Diagnosis not present

## 2020-01-05 DIAGNOSIS — E119 Type 2 diabetes mellitus without complications: Secondary | ICD-10-CM | POA: Diagnosis not present

## 2020-01-05 DIAGNOSIS — Z7984 Long term (current) use of oral hypoglycemic drugs: Secondary | ICD-10-CM | POA: Diagnosis not present

## 2020-01-05 DIAGNOSIS — E782 Mixed hyperlipidemia: Secondary | ICD-10-CM | POA: Diagnosis not present

## 2020-01-05 DIAGNOSIS — F341 Dysthymic disorder: Secondary | ICD-10-CM | POA: Diagnosis not present

## 2020-01-08 ENCOUNTER — Other Ambulatory Visit: Payer: Self-pay

## 2020-01-08 ENCOUNTER — Emergency Department (HOSPITAL_COMMUNITY): Payer: Medicare Other

## 2020-01-08 ENCOUNTER — Observation Stay (HOSPITAL_COMMUNITY): Payer: Medicare Other

## 2020-01-08 ENCOUNTER — Encounter (HOSPITAL_COMMUNITY): Payer: Self-pay | Admitting: *Deleted

## 2020-01-08 ENCOUNTER — Observation Stay (HOSPITAL_COMMUNITY)
Admission: EM | Admit: 2020-01-08 | Discharge: 2020-01-09 | Disposition: A | Discharge status: Home / Self Care | Payer: Medicare Other | Attending: Family Medicine | Admitting: Family Medicine

## 2020-01-08 DIAGNOSIS — N183 Chronic kidney disease, stage 3 unspecified: Secondary | ICD-10-CM | POA: Insufficient documentation

## 2020-01-08 DIAGNOSIS — I130 Hypertensive heart and chronic kidney disease with heart failure and stage 1 through stage 4 chronic kidney disease, or unspecified chronic kidney disease: Secondary | ICD-10-CM | POA: Insufficient documentation

## 2020-01-08 DIAGNOSIS — Z20822 Contact with and (suspected) exposure to covid-19: Secondary | ICD-10-CM | POA: Insufficient documentation

## 2020-01-08 DIAGNOSIS — I779 Disorder of arteries and arterioles, unspecified: Secondary | ICD-10-CM | POA: Diagnosis present

## 2020-01-08 DIAGNOSIS — I5032 Chronic diastolic (congestive) heart failure: Secondary | ICD-10-CM | POA: Diagnosis not present

## 2020-01-08 DIAGNOSIS — R0902 Hypoxemia: Secondary | ICD-10-CM | POA: Diagnosis not present

## 2020-01-08 DIAGNOSIS — Z79899 Other long term (current) drug therapy: Secondary | ICD-10-CM | POA: Insufficient documentation

## 2020-01-08 DIAGNOSIS — R4781 Slurred speech: Secondary | ICD-10-CM | POA: Diagnosis not present

## 2020-01-08 DIAGNOSIS — Z7984 Long term (current) use of oral hypoglycemic drugs: Secondary | ICD-10-CM | POA: Diagnosis not present

## 2020-01-08 DIAGNOSIS — R2689 Other abnormalities of gait and mobility: Secondary | ICD-10-CM | POA: Diagnosis not present

## 2020-01-08 DIAGNOSIS — G309 Alzheimer's disease, unspecified: Secondary | ICD-10-CM | POA: Insufficient documentation

## 2020-01-08 DIAGNOSIS — I10 Essential (primary) hypertension: Secondary | ICD-10-CM | POA: Diagnosis not present

## 2020-01-08 DIAGNOSIS — G459 Transient cerebral ischemic attack, unspecified: Principal | ICD-10-CM | POA: Insufficient documentation

## 2020-01-08 DIAGNOSIS — R29810 Facial weakness: Secondary | ICD-10-CM | POA: Diagnosis not present

## 2020-01-08 DIAGNOSIS — I4891 Unspecified atrial fibrillation: Secondary | ICD-10-CM | POA: Diagnosis not present

## 2020-01-08 DIAGNOSIS — I482 Chronic atrial fibrillation, unspecified: Secondary | ICD-10-CM | POA: Diagnosis present

## 2020-01-08 DIAGNOSIS — Z7901 Long term (current) use of anticoagulants: Secondary | ICD-10-CM | POA: Insufficient documentation

## 2020-01-08 DIAGNOSIS — F0280 Dementia in other diseases classified elsewhere without behavioral disturbance: Secondary | ICD-10-CM | POA: Diagnosis present

## 2020-01-08 DIAGNOSIS — R531 Weakness: Secondary | ICD-10-CM | POA: Diagnosis not present

## 2020-01-08 LAB — COMPREHENSIVE METABOLIC PANEL
ALT: 24 U/L (ref 0–44)
AST: 28 U/L (ref 15–41)
Albumin: 4 g/dL (ref 3.5–5.0)
Alkaline Phosphatase: 65 U/L (ref 38–126)
Anion gap: 10 (ref 5–15)
BUN: 20 mg/dL (ref 8–23)
CO2: 22 mmol/L (ref 22–32)
Calcium: 10 mg/dL (ref 8.9–10.3)
Chloride: 108 mmol/L (ref 98–111)
Creatinine, Ser: 1.26 mg/dL — ABNORMAL HIGH (ref 0.44–1.00)
GFR, Estimated: 43 mL/min — ABNORMAL LOW (ref >60)
Glucose, Bld: 153 mg/dL — ABNORMAL HIGH (ref 70–99)
Potassium: 4.1 mmol/L (ref 3.5–5.1)
Sodium: 140 mmol/L (ref 135–145)
Total Bilirubin: 0.6 mg/dL (ref 0.3–1.2)
Total Protein: 7.5 g/dL (ref 6.5–8.1)

## 2020-01-08 LAB — DIFFERENTIAL
Abs Immature Granulocytes: 0.02 10*3/uL (ref 0.00–0.07)
Basophils Absolute: 0.1 10*3/uL (ref 0.0–0.1)
Basophils Relative: 1 %
Eosinophils Absolute: 0.1 10*3/uL (ref 0.0–0.5)
Eosinophils Relative: 1 %
Immature Granulocytes: 0 %
Lymphocytes Relative: 27 %
Lymphs Abs: 1.5 10*3/uL (ref 0.7–4.0)
Monocytes Absolute: 0.6 10*3/uL (ref 0.1–1.0)
Monocytes Relative: 10 %
Neutro Abs: 3.4 10*3/uL (ref 1.7–7.7)
Neutrophils Relative %: 61 %

## 2020-01-08 LAB — I-STAT CHEM 8, ED
BUN: 25 mg/dL — ABNORMAL HIGH (ref 8–23)
Calcium, Ion: 1.27 mmol/L (ref 1.15–1.40)
Chloride: 107 mmol/L (ref 98–111)
Creatinine, Ser: 1.1 mg/dL — ABNORMAL HIGH (ref 0.44–1.00)
Glucose, Bld: 146 mg/dL — ABNORMAL HIGH (ref 70–99)
HCT: 45 % (ref 36.0–46.0)
Hemoglobin: 15.3 g/dL — ABNORMAL HIGH (ref 12.0–15.0)
Potassium: 4.1 mmol/L (ref 3.5–5.1)
Sodium: 143 mmol/L (ref 135–145)
TCO2: 23 mmol/L (ref 22–32)

## 2020-01-08 LAB — CBC
HCT: 44 % (ref 36.0–46.0)
Hemoglobin: 15 g/dL (ref 12.0–15.0)
MCH: 32.4 pg (ref 26.0–34.0)
MCHC: 34.1 g/dL (ref 30.0–36.0)
MCV: 95 fL (ref 80.0–100.0)
Platelets: 193 10*3/uL (ref 150–400)
RBC: 4.63 MIL/uL (ref 3.87–5.11)
RDW: 14.3 % (ref 11.5–15.5)
WBC: 5.7 10*3/uL (ref 4.0–10.5)
nRBC: 0 % (ref 0.0–0.2)

## 2020-01-08 LAB — PROTIME-INR
INR: 1.1 (ref 0.8–1.2)
Prothrombin Time: 13.8 seconds (ref 11.4–15.2)

## 2020-01-08 LAB — APTT: aPTT: 30 seconds (ref 24–36)

## 2020-01-08 LAB — CBG MONITORING, ED: Glucose-Capillary: 113 mg/dL — ABNORMAL HIGH (ref 70–99)

## 2020-01-08 MED ORDER — SODIUM CHLORIDE 0.9 % IV SOLN: INTRAVENOUS | Status: DC

## 2020-01-08 MED ORDER — MIRTAZAPINE 15 MG PO TABS
15.0000 mg | ORAL_TABLET | Freq: Every day | ORAL | Status: DC
  Filled 2020-01-08: qty 1

## 2020-01-08 MED ORDER — MEMANTINE HCL-DONEPEZIL HCL ER 28-10 MG PO CP24: ORAL_CAPSULE | Freq: Every day | ORAL | Status: DC

## 2020-01-08 MED ORDER — ATORVASTATIN CALCIUM 10 MG PO TABS
10.0000 mg | ORAL_TABLET | Freq: Every day | ORAL | Status: DC
  Administered 2020-01-09: 10 mg via ORAL
  Filled 2020-01-08: qty 1

## 2020-01-08 MED ORDER — METOPROLOL SUCCINATE ER 25 MG PO TB24
12.5000 mg | ORAL_TABLET | Freq: Every day | ORAL | Status: DC
  Administered 2020-01-09: 12.5 mg via ORAL
  Filled 2020-01-08: qty 1

## 2020-01-08 MED ORDER — AMLODIPINE BESYLATE 5 MG PO TABS: 10.0000 mg | ORAL_TABLET | Freq: Every day | ORAL | Status: DC

## 2020-01-08 MED ORDER — DONEPEZIL HCL 10 MG PO TABS
10.0000 mg | ORAL_TABLET | Freq: Every day | ORAL | Status: DC
  Administered 2020-01-09: 10 mg via ORAL
  Filled 2020-01-08: qty 1

## 2020-01-08 MED ORDER — ACETAMINOPHEN 650 MG RE SUPP: 650.0000 mg | RECTAL | Status: DC | PRN

## 2020-01-08 MED ORDER — APIXABAN 2.5 MG PO TABS
2.5000 mg | ORAL_TABLET | Freq: Two times a day (BID) | ORAL | Status: DC
  Administered 2020-01-09: 2.5 mg via ORAL
  Filled 2020-01-08 (×2): qty 1

## 2020-01-08 MED ORDER — SODIUM CHLORIDE 0.9% FLUSH
3.0000 mL | Freq: Once | INTRAVENOUS | Status: AC
  Administered 2020-01-08: 3 mL via INTRAVENOUS

## 2020-01-08 MED ORDER — DOFETILIDE 125 MCG PO CAPS
375.0000 ug | ORAL_CAPSULE | Freq: Two times a day (BID) | ORAL | Status: DC
  Administered 2020-01-09: 375 ug via ORAL
  Filled 2020-01-08 (×2): qty 3

## 2020-01-08 MED ORDER — FUROSEMIDE 20 MG PO TABS
20.0000 mg | ORAL_TABLET | Freq: Every day | ORAL | Status: DC
  Administered 2020-01-09: 20 mg via ORAL
  Filled 2020-01-08: qty 1

## 2020-01-08 MED ORDER — APIXABAN 2.5 MG PO TABS: 2.5000 mg | ORAL_TABLET | Freq: Two times a day (BID) | ORAL | Status: DC

## 2020-01-08 MED ORDER — ASPIRIN EC 81 MG PO TBEC
81.0000 mg | DELAYED_RELEASE_TABLET | Freq: Every day | ORAL | Status: DC
  Administered 2020-01-09: 81 mg via ORAL
  Filled 2020-01-08: qty 1

## 2020-01-08 MED ORDER — ACETAMINOPHEN 325 MG PO TABS: 650.0000 mg | ORAL_TABLET | ORAL | Status: DC | PRN

## 2020-01-08 MED ORDER — SENNOSIDES-DOCUSATE SODIUM 8.6-50 MG PO TABS: 1.0000 | ORAL_TABLET | Freq: Every evening | ORAL | Status: DC | PRN

## 2020-01-08 MED ORDER — POTASSIUM CHLORIDE CRYS ER 20 MEQ PO TBCR
20.0000 meq | EXTENDED_RELEASE_TABLET | Freq: Every day | ORAL | Status: DC
  Administered 2020-01-09: 20 meq via ORAL
  Filled 2020-01-08: qty 1

## 2020-01-08 MED ORDER — MEMANTINE HCL ER 28 MG PO CP24
28.0000 mg | ORAL_CAPSULE | Freq: Every day | ORAL | Status: DC
  Administered 2020-01-09: 28 mg via ORAL
  Filled 2020-01-08: qty 1

## 2020-01-08 MED ORDER — STROKE: EARLY STAGES OF RECOVERY BOOK: Freq: Once | Status: DC

## 2020-01-08 MED ORDER — ACETAMINOPHEN 160 MG/5ML PO SOLN: 650.0000 mg | ORAL | Status: DC | PRN

## 2020-01-08 NOTE — ED Triage Notes (Signed)
Pt is an ambulatory 85 yo female that lives at home with her husband, here via [facility] for episode of slurred speech that lasted 10 minutes. All symptoms resolved per [facility].  Episode was at 1645.  Pt has hx of afib and takes eloquis.  Pt has dementia.

## 2020-01-08 NOTE — H&P (Signed)
History and Physical   Donna [name] [id number] DOB: 01-01-1936 DOA: 01/08/2020  PCP: Josetta Huddle, MD   Patient coming from: Home  Chief Complaint: Slurred speech  HPI: Donna [name] is a 85 y.o. female with medical history significant of Alzheimer dementia, carotid artery disease, renal artery stenosis, diastolic heart failure, tricuspid regurgitation, CKD 3, fibromuscular dysplasia, hypertension, A. fib on Eliquis who presents after an episode of slurred speech with possible facial droop.  Patient has difficulty contributing to HPI due to her Alzheimer dementia.  History obtained with the assistance of chart review and patient family. Patient was at her baseline prior to the event she had an episode of about 10 to 30 minutes of slurred speech and possible facial droop around 4:45 PM per her daughter.  Per patient's daughter her father, the patient's husband helps with her medications at home but is inconsistent.  She states that her father says sometimes he has some issues with her taking the medication however patient daughter is able to get her to take the medications per her report at times.  It seems that with some current medications including Eliquis, she will have days where she takes just 1 or takes no tablets.  Did get Eliquis and Tikosyn today.  Patient denies fever, cough, chest pain, shortness of breath, abdominal pain, constipation, diarrhea, nausea.  ED Course: Vital signs stable in the ED.  Lab work-up showed BMP with creatinine of 1.26 which is stable and glucose of 153.  LFTs within normal limits.  CBC within normal limits.  PT PTT and INR within normal notes.  Respiratory panel for flu and Covid pending.  CT head was without acute abnormality but did show chronic ventricular enlargement and atrophy.  Review of Systems: As per HPI otherwise all other systems reviewed and are negative.  Past Medical History:  Diagnosis Date  . Alzheimer disease ([place]) 06/27/2019  .  Atherosclerosis of renal artery (HCC)    a. s/p R RA stenting;  b. 04/2014 Renal Art duplex: Patent R RA stent, <60% L RA stenosis. Followed by [facility].  . Carotid arterial disease ([place])    a. 01/2014 Carotid U/S: RICA [id number], LICA [id number]. Followed by [facility].  . CKD (chronic kidney disease), stage III (HCC)    Stage II/III  . Essential hypertension   . Fibrocystic breast   . Fibromuscular dysplasia ([place])   . Hyperglycemia   . Mitral regurgitation    a. Echo 08/2014: mild MR.  . Paroxysmal atrial fibrillation ([place])    a. Dx 07/26/2014, CHA2DS2VASc = 5-->Eliquis;  c. 08/2014 Echo: EF 55-60%, no rwma, mildly dil LA/RA, mod-sev TR, PASP 43mmHg. b. s/p DCCV 08/2014. c. back in atrial flutter 09/2014 -> rate control pursued.  . Paroxysmal atrial flutter ([place])    a. Dx 07/26/2014, CHA2DS2VASc = 5-->Eliquis;  c. 08/2014 Echo: EF 55-60%, no rwma, mildly dil LA/RA, mod-sev TR, PASP 69mmHg. b. s/p DCCV 08/2014. c. back in atrial flutter 09/2014 -> rate control pursued.  . Tricuspid regurgitation    a. Echo 08/2014: mod-severe.    Past Surgical History:  Procedure Laterality Date  . CARDIOVERSION N/A 09/01/2014   Procedure: CARDIOVERSION;  Surgeon: Josue Hector, MD;  Location: [facility] ENDOSCOPY;  Service: Cardiovascular;  Laterality: N/A;  . CARDIOVERSION N/A 11/23/2014   Procedure: CARDIOVERSION;  Surgeon: Skeet Latch, MD;  Location: [place];  Service: Cardiovascular;  Laterality: N/A;  . HEMORROIDECTOMY    . REDUCTION MAMMAPLASTY Bilateral   . RENAL ARTERY STENT  2008  Right renal artery by Dr. Drucie Opitz  . TONSILLECTOMY    . TUBAL LIGATION  1970's    Social History  reports that she has never smoked. She has never used smokeless tobacco. She reports current alcohol use. She reports that she does not use drugs.  Allergies  Allergen Reactions  . Tagamet [Cimetidine] Hives  . Diovan [Valsartan]     Hair loss    Family History  Problem Relation Age of Onset  . Stroke Mother 27  . Diabetes Mother    . Hypertension Mother   . Aneurysm Father        abdominal aortic aneurysm  . Diabetes Father   . Hypertension Father   . Heart attack Father   . Cancer Sister 75       breast cancer  . Breast cancer Neg Hx   Reviewed on admission  Prior to Admission medications   Medication Sig Start Date End Date Taking? Authorizing Provider  acetaminophen (TYLENOL) 650 MG CR tablet Take 650 mg every 8 (eight) hours as needed by mouth for pain.    [provider]  amLODipine (NORVASC) 10 MG tablet Take 1 tablet (10 mg total) by mouth daily. 11/23/18 03/22/19  Burtis Junes, NP  apixaban (ELIQUIS) 2.5 MG TABS tablet Take 1 tablet (2.5 mg total) by mouth 2 (two) times daily. 08/17/17   Burtis Junes, NP  atorvastatin (LIPITOR) 10 MG tablet Take 1 tablet (10 mg total) by mouth daily. 05/09/19 08/07/19  Burtis Junes, NP  diclofenac sodium (VOLTAREN) 1 % GEL Apply 1 application topically daily. 07/31/17   [provider]  dofetilide (TIKOSYN) 125 MCG capsule TAKE 3 CAPSULES BY MOUTH TWICE DAILY 11/28/19   Burtis Junes, NP  fluticasone Asencion Islam) 50 MCG/ACT nasal spray  05/19/19   [provider]  furosemide (LASIX) 20 MG tablet Take 1 tablet by mouth once daily 09/19/19   Burtis Junes, NP  magnesium oxide (MAG-OX) 400 MG tablet Take 1 tablet (400 mg total) by mouth every morning. 09/21/19   Burtis Junes, NP  metFORMIN (GLUCOPHAGE-XR) 500 MG 24 hr tablet Take 500 mg by mouth daily. 08/02/17   [provider]  metoprolol succinate (TOPROL XL) 25 MG 24 hr tablet Take 0.5 tablets (12.5 mg total) by mouth daily. 10/12/19   Burtis Junes, NP  mirtazapine (REMERON) 15 MG tablet Take 15 mg by mouth daily.    [provider]  mirtazapine (REMERON) 30 MG tablet  06/19/19   [provider]  Multiple Vitamin (MULTIVITAMIN WITH MINERALS) TABS tablet Take 1 tablet by mouth daily.    [provider]  [facility] 28-10 MG CP24  08/25/19   [provider]  nitroGLYCERIN (NITROSTAT) 0.4 MG SL tablet Place 1 tablet (0.4 mg total) under the tongue every 5 (five) minutes as needed for chest pain. 11/14/14   Dunn, Nedra Hai, PA-C  potassium chloride SA (KLOR-CON M20) 20 MEQ tablet Take 1 tablet (20 mEq total) by mouth daily. 09/28/14   Charlie Pitter, PA-C  SHINGRIX injection  06/21/19   [provider]  vitamin E 400 UNIT capsule Take 400 Units by mouth daily.    [provider]    Physical Exam: Vitals:   01/08/20 2015 01/08/20 2030 01/08/20 2032 01/08/20 2045  BP: (!) 151/83 (!) 154/67  (!) 143/71  Pulse: 62 61  (!) 58  Resp: 17 18  16   Temp:   97.7 F (36.5 C)  TempSrc:   Oral   SpO2: 100% 99%  98%  Weight:      Height:       Physical Exam Constitutional:      General: She is not in acute distress.    Appearance: Normal appearance.     Comments: Pleasantly demented elderly female  HENT:     Head: Normocephalic and atraumatic.     Mouth/Throat:     Mouth: Mucous membranes are moist.     Pharynx: Oropharynx is clear.  Eyes:     Extraocular Movements: Extraocular movements intact.     Pupils: Pupils are equal, round, and reactive to light.  Cardiovascular:     Rate and Rhythm: Normal rate and regular rhythm.     Pulses: Normal pulses.     Heart sounds: Normal heart sounds.  Pulmonary:     Effort: Pulmonary effort is normal. No respiratory distress.     Breath sounds: Normal breath sounds.  Abdominal:     General: Bowel sounds are normal. There is no distension.     Palpations: Abdomen is soft.     Tenderness: There is no abdominal tenderness.  Musculoskeletal:        General: No swelling or deformity.  Skin:    General: Skin is warm and dry.  Neurological:     General: No focal deficit present.     Mental Status: Mental status is at baseline.     Comments: Mental Status: Patient is awake, alert No signs of aphasia or neglect Cranial Nerves: II: Pupils equal, round, and reactive to light.   III,IV, VI: EOMI without ptosis or diploplia.  V: Facial sensation is symmetric tolight touch VII: Facial movement is symmetric.  VIII: hearing is intact to voice X: Uvula elevates symmetrically XI: Shoulder shrug is symmetric. XII: tongue is midline without atrophy or fasciculations.  Motor: Good effort thorughout, at Least 5/5 bilateral UE, 5/5 bilateral lower extremitiy Sensory: Sensation is grossly intact bilateral UEs & LEs    Labs on Admission: I have personally reviewed following labs and imaging studies  CBC: Recent Labs  Lab 01/08/20 1838 01/08/20 1929  WBC 5.7  --   NEUTROABS 3.4  --   HGB 15.0 15.3*  HCT 44.0 45.0  MCV 95.0  --   PLT 193  --     Basic Metabolic Panel: Recent Labs  Lab 01/08/20 1838 01/08/20 1929  NA 140 143  K 4.1 4.1  CL 108 107  CO2 22  --   GLUCOSE 153* 146*  BUN 20 25*  CREATININE 1.26* 1.10*  CALCIUM 10.0  --     GFR: Estimated Creatinine Clearance: 31.2 mL/min (A) (by C-G formula based on SCr of 1.1 mg/dL (H)).  Liver Function Tests: Recent Labs  Lab 01/08/20 1838  AST 28  ALT 24  ALKPHOS 65  BILITOT 0.6  PROT 7.5  ALBUMIN 4.0    Urine analysis:    Component Value Date/Time   COLORURINE YELLOW 05/07/2016 1807   APPEARANCEUR HAZY (A) 05/07/2016 1807   LABSPEC 1.025 05/07/2016 1807   PHURINE 5.0 05/07/2016 1807   GLUCOSEU NEGATIVE 05/07/2016 1807   HGBUR NEGATIVE 05/07/2016 1807   BILIRUBINUR NEGATIVE 05/07/2016 1807   KETONESUR 5 (A) 05/07/2016 1807   PROTEINUR 30 (A) 05/07/2016 1807   UROBILINOGEN 0.2 11/13/2014 1142   NITRITE NEGATIVE 05/07/2016 1807   LEUKOCYTESUR MODERATE (A) 05/07/2016 1807    Radiological Exams on Admission: CT HEAD WO CONTRAST  Result Date: 01/08/2020 CLINICAL DATA:  TIA.  Slurred speech EXAM: CT HEAD WITHOUT CONTRAST TECHNIQUE: Contiguous axial images were obtained from the base of the skull through the vertex without intravenous contrast. COMPARISON:  CT head 02/15/2018 FINDINGS:  Brain: Cerebral atrophy most prominent in the frontal and temporal lobes. Ventricular enlargement consistent with atrophy is stable. Negative for acute infarct, hemorrhage, mass Vascular: Negative for hyperdense vessel Skull: Negative Sinuses/Orbits: Negative Other: None IMPRESSION: Ventricular enlargement and atrophy which is most prominent the frontal and temporal lobes. No acute abnormality. Electronically Signed   By: Marlan Palau M.D.   On: 01/08/2020 19:34   EKG: Independently reviewed. Sinus rhythm 61 bpm.  PVCs. Assessment/Plan Principal Problem:   TIA (transient ischemic attack) Active Problems:   Hypertension   Carotid arterial disease (HCC)   Chronic atrial fibrillation (HCC)   Chronic diastolic HF (heart failure) (HCC)   CKD (chronic kidney disease), stage III (HCC)   Chronic anticoagulation   Alzheimer disease (HCC)  TIA > 10 to 30 minutes of slurred speech with possible facial droop around 4:45 PM per family. > Symptoms have resolved by the time patient evaluated in ED. > Per family reports she may be missing some doses of her Eliquis that she takes for her A. fib.  Her husband helps her with her medications but is not consistent per daughter's report > Does have a history of carotid artery disease which could be contributing the most likely due to missed Eliquis doses - Neurology consulted in ED - Allow for permissive HTN (systolic < 220 and diastolic < 120) - ASA 81 mg daily - Continue home statin  - Echocardiogram  - Carotid doppler - A1C  - Lipid panel  - Tele monitoring  - SLP eval - PT/OT  Atrial fibrillation - Continue home metoprolol, Tikosyn, Eliquis  Diastolic heart failure - Continue home Lasix, potassium - Daily weights, I/O  CKD 3 > Creatinine stable around 1.26 baseline about 1.1 - Avoid nephrotoxic agents - Trend renal function electrolytes  Hypertension - Hold amlodipine in setting of permissive hypertension  DVT prophylaxis: Eliquis   Code Status:   Full  Family Communication:  Daughter updated at bedside  Disposition Plan:   Patient is from:  Home  Anticipated DC to:  Home  Anticipated DC date:  1 to 2 days  Anticipated DC barriers: None  Consults called:  Neurology consulted in ED  Admission status:  Observation, telemetry   Severity of Illness: The appropriate patient status for this patient is OBSERVATION. Observation status is judged to be reasonable and necessary in order to provide the required intensity of service to ensure the patient's safety. The patient's presenting symptoms, physical exam findings, and initial radiographic and laboratory data in the context of their medical condition is felt to place them at decreased risk for further clinical deterioration. Furthermore, it is anticipated that the patient will be medically stable for discharge from the hospital within 2 midnights of admission. The following factors support the patient status of observation.   " The patient's presenting symptoms include episode of slurred speech with possible facial droop. " The physical exam findings include stable exam findings. " The initial radiographic and laboratory data are stable with evidence of stable ventricular enlargement and atrophy on CT head as well as CKD with creatinine stable at 1.26.   Synetta Fail MD [facility]  How to contact the [facility] Attending or Consulting provider 7A - 7P or covering provider during after hours 7P -7A, for this patient?  1. Check the care team in [facility] and look for a) attending/consulting [facility] provider listed and b) the [facility] team listed 2. Log into [email or web address] and use [place] [facility]'s universal password to access. If you do not have the password, please contact the hospital operator. 3. Locate the [facility] provider you are looking for under [facility] and page to a number that you can be directly reached. 4. If you still have difficulty reaching the provider, please page  the [facility] (Director on Call) for the Hospitalists listed on amion for assistance.  01/08/2020, 9:30 PM

## 2020-01-08 NOTE — ED Provider Notes (Signed)
[facility] EMERGENCY DEPARTMENT Provider Note   CSN: [id number] Arrival date & time: 01/08/20  [id number]  LEVEL 5 CAVEAT - DEMENTIA  History Chief Complaint  Patient presents with  . Aphasia    Donna [name] is a 85 y.o. female.  HPI 85 year old female presents with transient trouble speaking.  History is from the daughter due to the patient's dementia.  She lives at home with the husband who provides care for her though the daughter reports that he does not always give her meds as prescribed.  Today at around 4:45 PM she all of a sudden developed garbled speech.  Daughter cannot understand a single word.  This lasted 10-30 minutes.  By the time EMS arrived she was better.  Now is back to her normal.  Otherwise has not been ill recently.  Perhaps had a right facial droop per the daughter which is no longer present.   Past Medical History:  Diagnosis Date  . Alzheimer disease ([place]) 06/27/2019  . Atherosclerosis of renal artery (HCC)    a. s/p R RA stenting;  b. 04/2014 Renal Art duplex: Patent R RA stent, <60% L RA stenosis. [facility] by [facility].  . Carotid arterial disease ([place])    a. 01/2014 Carotid U/S: RICA [id number], LICA [id number]. Followed by [facility].  . CKD (chronic kidney disease), stage III (HCC)    Stage II/III  . Essential hypertension   . Fibrocystic breast   . Fibromuscular dysplasia ([place])   . Hyperglycemia   . Mitral regurgitation    a. Echo 08/2014: mild MR.  . Paroxysmal atrial fibrillation ([place])    a. Dx 07/26/2014, CHA2DS2VASc = 5-->Eliquis;  c. 08/2014 Echo: EF 55-60%, no rwma, mildly dil LA/RA, mod-sev TR, PASP 69mmHg. b. s/p DCCV 08/2014. c. back in atrial flutter 09/2014 -> rate control pursued.  . Paroxysmal atrial flutter ([place])    a. Dx 07/26/2014, CHA2DS2VASc = 5-->Eliquis;  c. 08/2014 Echo: EF 55-60%, no rwma, mildly dil LA/RA, mod-sev TR, PASP 69mmHg. b. s/p DCCV 08/2014. c. back in atrial flutter 09/2014 -> rate control pursued.  . Tricuspid regurgitation    a. Echo  08/2014: mod-severe.    Patient Active Problem List   Diagnosis Date Noted  . Alzheimer disease ([place]) 06/27/2019  . Headache 12/21/2018  . Chronic anticoagulation 02/22/2015  . Chronic atrial fibrillation ([place]) 11/19/2014  . Chronic diastolic HF (heart failure) ([place]) 11/19/2014  . CKD (chronic kidney disease), stage III ([place]) 11/19/2014  . Fibromuscular dysplasia ([place])   . Carotid arterial disease ([place])   . Hypertension 05/28/2011  . Menopause 05/28/2011  . Atherosclerotic RAS (renal artery stenosis), bilateral ([place]) 06/28/2010    Past Surgical History:  Procedure Laterality Date  . CARDIOVERSION N/A 09/01/2014   Procedure: CARDIOVERSION;  Surgeon: Josue Hector, MD;  Location: [facility] ENDOSCOPY;  Service: Cardiovascular;  Laterality: N/A;  . CARDIOVERSION N/A 11/23/2014   Procedure: CARDIOVERSION;  Surgeon: Skeet Latch, MD;  Location: [place];  Service: Cardiovascular;  Laterality: N/A;  . HEMORROIDECTOMY    . REDUCTION MAMMAPLASTY Bilateral   . RENAL ARTERY STENT  2008   Right renal artery by Dr. Drucie Opitz  . TONSILLECTOMY    . TUBAL LIGATION  1970's     OB History    Gravida  4   Para  4   Term  4   Preterm  0   AB  0   Living  4     SAB  0   IAB  0  Ectopic  0   Multiple  0   Live Births              Family History  Problem Relation Age of Onset  . Stroke Mother 58  . Diabetes Mother   . Hypertension Mother   . Aneurysm Father        abdominal aortic aneurysm  . Diabetes Father   . Hypertension Father   . Heart attack Father   . Cancer Sister 49       breast cancer  . Breast cancer Neg Hx     Social History   Tobacco Use  . Smoking status: Never Smoker  . Smokeless tobacco: Never Used  Vaping Use  . Vaping Use: Never used  Substance Use Topics  . Alcohol use: Yes  . Drug use: No    Home Medications Prior to Admission medications   Medication Sig Start Date End Date Taking? Authorizing Provider  acetaminophen  (TYLENOL) 650 MG CR tablet Take 650 mg every 8 (eight) hours as needed by mouth for pain.    [provider]  amLODipine (NORVASC) 10 MG tablet Take 1 tablet (10 mg total) by mouth daily. 11/23/18 03/22/19  Rosalio Macadamia, NP  apixaban (ELIQUIS) 2.5 MG TABS tablet Take 1 tablet (2.5 mg total) by mouth 2 (two) times daily. 08/17/17   Rosalio Macadamia, NP  atorvastatin (LIPITOR) 10 MG tablet Take 1 tablet (10 mg total) by mouth daily. 05/09/19 08/07/19  Rosalio Macadamia, NP  diclofenac sodium (VOLTAREN) 1 % GEL Apply 1 application topically daily. 07/31/17   [provider]  dofetilide (TIKOSYN) 125 MCG capsule TAKE 3 CAPSULES BY MOUTH TWICE DAILY 11/28/19   Rosalio Macadamia, NP  fluticasone Aleda Grana) 50 MCG/ACT nasal spray  05/19/19   [provider]  furosemide (LASIX) 20 MG tablet Take 1 tablet by mouth once daily 09/19/19   Rosalio Macadamia, NP  magnesium oxide (MAG-OX) 400 MG tablet Take 1 tablet (400 mg total) by mouth every morning. 09/21/19   Rosalio Macadamia, NP  metFORMIN (GLUCOPHAGE-XR) 500 MG 24 hr tablet Take 500 mg by mouth daily. 08/02/17   [provider]  metoprolol succinate (TOPROL XL) 25 MG 24 hr tablet Take 0.5 tablets (12.5 mg total) by mouth daily. 10/12/19   Rosalio Macadamia, NP  mirtazapine (REMERON) 15 MG tablet Take 15 mg by mouth daily.    [provider]  mirtazapine (REMERON) 30 MG tablet  06/19/19   [provider]  Multiple Vitamin (MULTIVITAMIN WITH MINERALS) TABS tablet Take 1 tablet by mouth daily.    [provider]  [facility] 28-10 MG CP24  08/25/19   [provider]  nitroGLYCERIN (NITROSTAT) 0.4 MG SL tablet Place 1 tablet (0.4 mg total) under the tongue every 5 (five) minutes as needed for chest pain. 11/14/14   Dunn, Tacey Ruiz, PA-C  potassium chloride SA (KLOR-CON M20) 20 MEQ tablet Take 1 tablet (20 mEq total) by mouth daily. 09/28/14   Laurann Montana, PA-C  SHINGRIX injection  06/21/19   [provider]  vitamin E 400 UNIT capsule Take 400 Units by mouth daily.    [provider]    Allergies    Tagamet [cimetidine] and Diovan [valsartan]  Review of Systems   Review of Systems  Unable to perform ROS: Dementia    Physical Exam Updated Vital Signs BP (!) 151/83   Pulse 62   Temp 97.7 F (36.5 C) (Oral)  Resp 17   Ht 5\' 2"  (1.575 m)   Wt 54.8 kg   SpO2 100%   BMI 22.10 kg/m   Physical Exam Vitals and nursing note reviewed.  Constitutional:      General: She is not in acute distress.    Appearance: She is well-developed and well-nourished. She is not ill-appearing or diaphoretic.  HENT:     Head: Normocephalic and atraumatic.     Right Ear: External ear normal.     Left Ear: External ear normal.     Nose: Nose normal.  Eyes:     General:        Right eye: No discharge.        Left eye: No discharge.     Extraocular Movements: Extraocular movements intact.     Pupils: Pupils are equal, round, and reactive to light.  Cardiovascular:     Rate and Rhythm: Normal rate and regular rhythm.     Heart sounds: Normal heart sounds.  Pulmonary:     Effort: Pulmonary effort is normal.     Breath sounds: Normal breath sounds.  Abdominal:     Palpations: Abdomen is soft.     Tenderness: There is no abdominal tenderness.  Skin:    General: Skin is warm and dry.  Neurological:     Mental Status: She is alert. She is disoriented.     Comments: CN 3-12 grossly intact. 5/5 strength in all 4 extremities. Grossly normal sensation. Normal finger to nose.   Psychiatric:        Mood and Affect: Mood is not anxious.     ED Results / Procedures / Treatments   Labs (all labs ordered are listed, but only abnormal results are displayed) Labs Reviewed  COMPREHENSIVE METABOLIC PANEL - Abnormal; Notable for the following components:      Result Value   Glucose, Bld 153 (*)    Creatinine, Ser 1.26 (*)    GFR, Estimated 43 (*)    All other components within  normal limits  CBG MONITORING, ED - Abnormal; Notable for the following components:   Glucose-Capillary 113 (*)    All other components within normal limits  I-STAT CHEM 8, ED - Abnormal; Notable for the following components:   BUN 25 (*)    Creatinine, Ser 1.10 (*)    Glucose, Bld 146 (*)    Hemoglobin 15.3 (*)    All other components within normal limits  SARS CORONAVIRUS 2 (TAT 6-24 HRS)  PROTIME-INR  APTT  CBC  DIFFERENTIAL    EKG EKG Interpretation  Date/Time:  Sunday January 08 2020 20:14:31 EST Ventricular Rate:  61 PR Interval:    QRS Duration: 82 QT Interval:  457 QTC Calculation: 461 R Axis:   60 Text Interpretation: Sinus rhythm Multiform ventricular premature complexes Prolonged PR interval Anterior infarct, old Confirmed by Sherwood Gambler [phone]) on 01/08/2020 8:26:14 PM   Radiology CT HEAD WO CONTRAST  Result Date: 01/08/2020 CLINICAL DATA:  TIA.  Slurred speech EXAM: CT HEAD WITHOUT CONTRAST TECHNIQUE: Contiguous axial images were obtained from the base of the skull through the vertex without intravenous contrast. COMPARISON:  CT head 02/15/2018 FINDINGS: Brain: Cerebral atrophy most prominent in the frontal and temporal lobes. Ventricular enlargement consistent with atrophy is stable. Negative for acute infarct, hemorrhage, mass Vascular: Negative for hyperdense vessel Skull: Negative Sinuses/Orbits: Negative Other: None IMPRESSION: Ventricular enlargement and atrophy which is most prominent the frontal and temporal lobes. No acute abnormality. Electronically Signed   By:  Franchot Gallo M.D.   On: 01/08/2020 19:34    Procedures Procedures (including critical care time)  Medications Ordered in ED Medications  sodium chloride flush (NS) 0.9 % injection 3 mL (3 mLs Intravenous Given 01/08/20 2007)    ED Course  I have reviewed the triage vital signs and the nursing notes.  Pertinent labs & imaging results that were available during my care of the patient were  reviewed by me and considered in my medical decision making (see chart for details).    MDM Rules/Calculators/A&P                          Patient presents with what sounds like a TIA. D/w Dr. Theda Sers of Neuro, recommends TIA admission to hospitalist service. Seems to be back to baseline at this time.  Final Clinical Impression(s) / ED Diagnoses Final diagnoses:  TIA (transient ischemic attack)    Rx / DC Orders ED Discharge Orders    None       Sherwood Gambler, MD 01/08/20 2047

## 2020-01-08 NOTE — Consult Note (Signed)
Neurology Consult H&P  CC: Episode of garbled speech  History is obtained from: Chart  HPI: Donna [name] is a 85 y.o. female history of dementia and a.fib on Eliquis with witnessed 10 minutes episode of garbled speech. Daughter stated that during the event the patient was not confused and was conveying the she did not like her dinner. There was no confusion after.   Information below taken from chart: "Daughter reports that he does not always give her meds as prescribed. Today at around 4:45 PM she all of a sudden developed garbled speech. Daughter cannot understand a single word. This lasted 10-30 minutes. By the time EMS arrived she was better. Now is back to her normal. Otherwise has not been ill recently. Perhaps had a right facial droop per the daughter which is no longer present."  Daughter added that the patient's husband is an alcoholic and cannot take good care of himself and often forgets to care for patient as well as forgetting to administer medications. Daughter noted that when looking at the pillbox, Wednesday and Thursday's medications had not been administered and that Saturday's daytime medications were not given.   LKW: 1645 tpa given?: No, resolved IR Thrombectomy? No, lvo Modified Rankin Scale: 0-Completely asymptomatic and back to baseline post- stroke NIHSS: 0  ROS: A complete ROS was performed and is negative except as noted in the HPI.   Past Medical History:  Diagnosis Date  . Alzheimer disease (HCC) 06/27/2019  . Atherosclerosis of renal artery (HCC)    a. s/p R RA stenting;  b. 04/2014 Renal Art duplex: Patent R RA stent, <60% L RA stenosis. Followed by [facility].  . Carotid arterial disease (HCC)    a. 01/2014 Carotid U/S: RICA 40%, LICA 40%. Followed by [facility].  . CKD (chronic kidney disease), stage III (HCC)    Stage II/III  . Essential hypertension   . Fibrocystic breast   . Fibromuscular dysplasia (HCC)   . Hyperglycemia   . Mitral regurgitation    a. Echo  08/2014: mild MR.  . Paroxysmal atrial fibrillation (HCC)    a. Dx 07/26/2014, CHA2DS2VASc = 5-->Eliquis;  c. 08/2014 Echo: EF 55-60%, no rwma, mildly dil LA/RA, mod-sev TR, PASP . b. s/p DCCV 08/2014. c. back in atrial flutter 09/2014 -> rate control pursued.  . Paroxysmal atrial flutter (HCC)    a. Dx 07/26/2014, CHA2DS2VASc = 5-->Eliquis;  c. 08/2014 Echo: EF 55-60%, no rwma, mildly dil LA/RA, mod-sev TR, PASP . b. s/p DCCV 08/2014. c. back in atrial flutter 09/2014 -> rate control pursued.  . Tricuspid regurgitation    a. Echo 08/2014: mod-severe.    Family History  Problem Relation Age of Onset  . Stroke Mother 36  . Diabetes Mother   . Hypertension Mother   . Aneurysm Father        abdominal aortic aneurysm  . Diabetes Father   . Hypertension Father   . Heart attack Father   . Cancer Sister 99       breast cancer  . Breast cancer Neg Hx    Social History:  reports that she has never smoked. She has never used smokeless tobacco. She reports current alcohol use. She reports that she does not use drugs.  Prior to Admission medications   Medication Sig Start Date End Date Taking? Authorizing Provider  acetaminophen (TYLENOL) 650 MG CR tablet Take 650 mg every 8 (eight) hours as needed by mouth for pain.    [provider]  amLODipine (  NORVASC) 10 MG tablet Take 1 tablet (10 mg total) by mouth daily. 11/23/18 03/22/19  Burtis Junes, NP  apixaban (ELIQUIS) 2.5 MG TABS tablet Take 1 tablet (2.5 mg total) by mouth 2 (two) times daily. 08/17/17   Burtis Junes, NP  atorvastatin (LIPITOR) 10 MG tablet Take 1 tablet (10 mg total) by mouth daily. 05/09/19 08/07/19  Burtis Junes, NP  diclofenac sodium (VOLTAREN) 1 % GEL Apply 1 application topically daily. 07/31/17   [provider]  dofetilide (TIKOSYN) 125 MCG capsule TAKE 3 CAPSULES BY MOUTH TWICE DAILY 11/28/19   Burtis Junes, NP  fluticasone Asencion Islam) 50 MCG/ACT nasal spray  05/19/19   [provider]  furosemide (LASIX) 20 MG tablet Take 1 tablet by mouth once daily 09/19/19   Burtis Junes, NP  magnesium oxide (MAG-OX) 400 MG tablet Take 1 tablet (400 mg total) by mouth every morning. 09/21/19   Burtis Junes, NP  metFORMIN (GLUCOPHAGE-XR) 500 MG 24 hr tablet Take 500 mg by mouth daily. 08/02/17   [provider]  metoprolol succinate (TOPROL XL) 25 MG 24 hr tablet Take 0.5 tablets (12.5 mg total) by mouth daily. 10/12/19   Burtis Junes, NP  mirtazapine (REMERON) 15 MG tablet Take 15 mg by mouth daily.    [provider]  mirtazapine (REMERON) 30 MG tablet  06/19/19   [provider]  Multiple Vitamin (MULTIVITAMIN WITH MINERALS) TABS tablet Take 1 tablet by mouth daily.    [provider]  [facility] 28-10 MG CP24  08/25/19   [provider]  nitroGLYCERIN (NITROSTAT) 0.4 MG SL tablet Place 1 tablet (0.4 mg total) under the tongue every 5 (five) minutes as needed for chest pain. 11/14/14   Dunn, Nedra Hai, PA-C  potassium chloride SA (KLOR-CON M20) 20 MEQ tablet Take 1 tablet (20 mEq total) by mouth daily. 09/28/14   Charlie Pitter, PA-C  SHINGRIX injection  06/21/19   [provider]  vitamin E 400 UNIT capsule Take 400 Units by mouth daily.    [provider]   Exam: Current vital signs: BP (!) 151/83   Pulse 62   Resp 17   Ht 5\' 2"  (1.575 m)   Wt 54.8 kg   SpO2 100%   BMI 22.10 kg/m   Physical Exam  Constitutional: Appears well-developed and well-nourished.  Psych: Affect appropriate to situation Eyes: No scleral injection HENT: No OP obstrucion Head: Normocephalic.  Cardiovascular: Normal rate and regular rhythm.  Respiratory: Effort normal and breath sounds normal to anterior ascultation GI: Soft.  No distension. There is no tenderness.  Skin: WDI  Neuro: Mental Status: Patient is awake, alert, oriented to person and daughter. Patient is not able to give a clear and coherent history No signs of aphasia or  neglect. Cranial Nerves: II: Visual Fields are full. Pupils are equal, round, and reactive to light. III,IV, VI: EOMI without ptosis or diploplia.  V: Facial sensation is symmetric to temperature VII: Facial movement is symmetric.  VIII: hearing is intact to voice X: Uvula elevates symmetrically XI: Shoulder shrug is symmetric. XII: tongue is midline without atrophy or fasciculations.  Motor: Tone is normal. Bulk is normal. 5/5 strength was present in all four extremities. Sensory: Sensation is symmetric to light touch and temperature in the arms and legs. Deep Tendon Reflexes: 2+ and symmetric in the biceps and patellae. Plantars: Toes are downgoing bilaterally. Cerebellar: FNF and HKS are intact bilaterally.  I have reviewed  labs in epic and the pertinent results are: Results for CIARRA, WELBURN (MRN [id number]) as of 01/08/2020 20:35  Ref. Range 01/08/2020 19:29  Glucose Latest Ref Range: 70 - 99 mg/dL 146 (H)  BUN Latest Ref Range: 8 - 23 mg/dL 25 (H)  Creatinine Latest Ref Range: 0.44 - 1.00 mg/dL 1.10 (H)  Results for LABELLE, TREMONTI (MRN [id number]) as of 01/08/2020 20:35  Ref. Range 01/08/2020 19:29  Hemoglobin Latest Ref Range: 12.0 - 15.0 g/dL 15.3 (H)  HCT Latest Ref Range: 36.0 - 46.0 % 45.0  Results for BUFF, NELSON (MRN [id number]) as of 01/08/2020 20:35  Ref. Range 01/08/2020 18:38  APTT Latest Ref Range: 24 - 36 seconds 30   I have reviewed the images obtained: NCT head showed Ventricular enlargement and atrophy which is most prominent the frontal and temporal lobes. No acute abnormality.  Assessment: Donna [name] is a 85 y.o. female PMHx afib on Eliquis dementia with 10 minute episode of garbled speech completely resolved PTA. Due to the home situation (husband is an alcoholic and cannot care for himself) and the patient's dementia, medication administration is problematic and predisposes the patient to severe and possibly life threatening stroke and recommend social work  evaluation for home medication administration assistance.    Impression:  TIA AFIB on Eliquis Dementia HTN  Plan: - Recommend vascular imaging with MRA/CTA. - Recommend TTE. - Recommend labs: HbA1c, lipid panel, TSH. - Recommend Statin if LDL > 70 - Continue Eliquis. - Permissive hypertension first 24 h < 220/110.  - Telemetry monitoring for arrhythmia. - Recommend bedside Swallow screen. - Recommend Stroke education. - Recommend PT/OT/SLP consult. - Recommend social work consultation.  Electronically signed by: Dr. Lynnae Sandhoff Pager: [phone] 01/08/2020, 8:32 PM

## 2020-01-09 ENCOUNTER — Encounter (HOSPITAL_COMMUNITY): Payer: Self-pay | Admitting: Internal Medicine

## 2020-01-09 ENCOUNTER — Observation Stay (HOSPITAL_BASED_OUTPATIENT_CLINIC_OR_DEPARTMENT_OTHER): Payer: Medicare Other

## 2020-01-09 ENCOUNTER — Observation Stay (HOSPITAL_COMMUNITY): Payer: Medicare Other

## 2020-01-09 DIAGNOSIS — G459 Transient cerebral ischemic attack, unspecified: Secondary | ICD-10-CM | POA: Diagnosis not present

## 2020-01-09 DIAGNOSIS — R4781 Slurred speech: Secondary | ICD-10-CM | POA: Diagnosis not present

## 2020-01-09 DIAGNOSIS — I361 Nonrheumatic tricuspid (valve) insufficiency: Secondary | ICD-10-CM

## 2020-01-09 DIAGNOSIS — I10 Essential (primary) hypertension: Secondary | ICD-10-CM | POA: Diagnosis not present

## 2020-01-09 LAB — LIPID PANEL
Cholesterol: 138 mg/dL (ref 0–200)
HDL: 45 mg/dL (ref >40)
LDL Cholesterol: 75 mg/dL (ref 0–99)
Total CHOL/HDL Ratio: 3.1 RATIO
Triglycerides: 92 mg/dL (ref <150)
VLDL: 18 mg/dL (ref 0–40)

## 2020-01-09 LAB — ECHOCARDIOGRAM COMPLETE
Area-P 1/2: 4.39 cm2
Calc EF: 56.5 %
Height: 62 in
S' Lateral: 2.5 cm
Single Plane A2C EF: 60.1 %
Single Plane A4C EF: 52.8 %
Weight: 1932.99 oz

## 2020-01-09 LAB — SARS CORONAVIRUS 2 (TAT 6-24 HRS): SARS Coronavirus 2: NEGATIVE

## 2020-01-09 LAB — HEMOGLOBIN A1C
Hgb A1c MFr Bld: 6.1 % — ABNORMAL HIGH (ref 4.8–5.6)
Mean Plasma Glucose: 128.37 mg/dL

## 2020-01-09 MED ORDER — IOHEXOL 350 MG/ML SOLN
60.0000 mL | Freq: Once | INTRAVENOUS | Status: AC | PRN
  Administered 2020-01-09: 60 mL via INTRAVENOUS

## 2020-01-09 NOTE — Progress Notes (Signed)
Occupational Therapy Evaluation  Pt appears close to baseline. Daughter expresses concenr over medication management. Recommend follow up with nsg to assist with medication compliance. May be good participant for PACE program - discussed with MD. No further OT needs.     01/09/20 1600  OT Visit Information  Last OT Received On 01/09/20  Assistance Needed +1  PT/OT/SLP Co-Evaluation/Treatment Yes  Reason for Co-Treatment Necessary to address cognition/behavior during functional activity  OT goals addressed during session ADL's and self-care  History of Present Illness Pt is 85 yo female with PMH including Alzheimer dementia, CAD, renal artery stenosis,  diastolic heart failure, tricuspid regurgitation, CKD 3, fibromuscular dysplasia, hypertension, A. fib on Eliquis who presents after an episode of slurred speech with possible facial droop that have now resolved.  MRI negative for acute abnormality. Pt admitted for TIA  Precautions  Precautions None  Home Living  Family/patient expects to be discharged to: Private residence  Living Arrangements Spouse/significant other  Available Help at Discharge Family;Available 24 hours/day;Other (Comment)  Type of [place] to enter  Entrance Stairs-Number of Steps 2  Home Layout One level  Bathroom Programmer, multimedia Grab bars - tub/shower;Grab bars - toilet  Prior Function  Level of Independence Needs assistance  Comments Daughter present and provided history.  Reports pt able to ambulate without AD and that her and pt's sister come into provide supervision for showers.  Reports pt's spouse tries to assist with medication management but is not always accurate.  Communication  Communication No difficulties  Pain Assessment  Pain Assessment Faces  Faces Pain Scale 0  Cognition  Arousal/Alertness Awake/alert  Behavior During Therapy WFL for tasks assessed/performed  Overall  Cognitive Status History of cognitive impairments - at baseline  General Comments Pt is very confused.  Repeatedly talks about friend "Rush Landmark" from [employer] that likes to cook.  She was pleasant and followed commands.  Daughter present and reports cognition at baseline.  Upper Extremity Assessment  Upper Extremity Assessment Overall WFL for tasks assessed  Lower Extremity Assessment  Lower Extremity Assessment Defer to PT evaluation  Cervical / Trunk Assessment  Cervical / Trunk Assessment Normal  ADL  Overall ADL's  At baseline  Bed Mobility  Overal bed mobility Needs Assistance  Bed Mobility Supine to Sit  Supine to sit Min assist  General bed mobility comments Min A for hand to pull to sit  Transfers  Overall transfer level Needs assistance  Equipment used None  Transfers Sit to/from Stand  Sit to Stand Min guard  General transfer comment Min guard for safety due to height of stretcher; performed multiple times throughout session. Pt also performed toileting ADLs with min cues.  Balance  Overall balance assessment Needs assistance  Sitting balance-Leahy Scale Good  Standing balance-Leahy Scale Good  Standing balance comment Able to static stand and ambulate without AD  General Comments  General comments (skin integrity, edema, etc.) Pt's IV in L hand did not appear overly intact upon arrival and fell out during transfers - notified RN and covered with gauze  OT - End of Session  Equipment Utilized During Treatment Gait belt  Activity Tolerance Patient tolerated treatment well  Patient left in bed;with call bell/phone within reach;with family/visitor present  Nurse Communication Mobility status;Other (comment) (DC needs)  OT Assessment  OT Recommendation/Assessment Patient does not need any further OT services  OT Visit Diagnosis Other abnormalities of gait and mobility (R26.89)  OT Problem List Decreased safety awareness;Decreased cognition  AM-PAC OT "6 Clicks" Daily  Activity Outcome Measure (Version 2)  Help from another person eating meals? 4  Help from another person taking care of personal grooming? 3  Help from another person toileting, which includes using toliet, bedpan, or urinal? 3  Help from another person bathing (including washing, rinsing, drying)? 3  Help from another person to put on and taking off regular upper body clothing? 3  Help from another person to put on and taking off regular lower body clothing? 3  6 Click Score 19  OT Recommendation  Follow Up Recommendations Other (comment) (Assess option of PACE for pt)  OT Equipment None recommended by OT  Acute Rehab OT Goals  Patient Stated Goal return home  OT Goal Formulation With patient/family  OT Time Calculation  OT Start Time (ACUTE ONLY) 1208  OT Stop Time (ACUTE ONLY) 1230  OT Time Calculation (min) 22 min  OT General Charges  $OT Visit 1 Visit  OT Evaluation  $OT Eval Low Complexity 1 Low  Kamaury Cutbirth, OT/L   Acute OT Clinical Specialist Acute Rehabilitation Services Pager [phone] Office [phone]

## 2020-01-09 NOTE — Evaluation (Signed)
Physical Therapy Evaluation Patient Details Name: Donna [name] MRN: [id number] DOB: 04-Apr-1935 Today's Date: 01/09/2020   History of Present Illness  Pt is 85 yo female with PMH including Alzheimer dementia, CAD, renal artery stenosis,  diastolic heart failure, tricuspid regurgitation, CKD 3, fibromuscular dysplasia, hypertension, A. fib on Eliquis who presents after an episode of slurred speech with possible facial droop that have now resolved.  MRI negative for acute abnormality. Pt admitted for TIA  Clinical Impression  Pt admitted with above diagnosis.  Her facial droop and slurred speech have resolved.  Pt is very confused but daughter present and reports this is baseline.  Pt was able to ambulate with min guard/supervision without AD.  Daughter agrees transfers and ambulation appear to be baseline.  Pt's strength and balance appear to be at baseline. Pt has 24 hr support but daughter expressed some difficulty with pt's spouse assisting pt with medication management and interested in help for this. No further PT needs.     Follow Up Recommendations Supervision/Assistance - 24 hour (Could benefit from [facility] nursing to assist with medication management)    Equipment Recommendations  None recommended by PT    Recommendations for Other Services       Precautions / Restrictions Precautions Precautions: None      Mobility  Bed Mobility Overal bed mobility: Needs Assistance Bed Mobility: Supine to Sit     Supine to sit: Min assist     General bed mobility comments: Min A for hand to pull to sit    Transfers Overall transfer level: Needs assistance Equipment used: None Transfers: Sit to/from Stand Sit to Stand: Min guard         General transfer comment: Min guard for safety due to height of stretcher; performed multiple times throughout session. Pt also performed toileting ADLs with min cues.  Ambulation/Gait Ambulation/Gait assistance: Min guard Gait Distance (Feet): 200  Feet Assistive device: None Gait Pattern/deviations: Step-through pattern;Decreased stride length Gait velocity: decreased   General Gait Details: Ambulated without AD; provided min guard for safety and pt with waviering gait pattern at times but no overt LOB; daughter reports appears baseline  Science writer    Modified Rankin (Stroke Patients Only)       Balance Overall balance assessment: Needs assistance Sitting-balance support: No upper extremity supported Sitting balance-Leahy Scale: Good     Standing balance support: No upper extremity supported Standing balance-Leahy Scale: Good Standing balance comment: Able to static stand and ambulate without AD                             Pertinent Vitals/Pain Pain Assessment: No/denies pain    Home Living Family/patient expects to be discharged to:: Private residence Living Arrangements: Spouse/significant other Available Help at Discharge: Family;Available 24 hours/day;Other (Comment) (Lives with spouse but pt's daughter and sister also come into assist) Type of Home: House Home Access: Stairs to enter   Technical brewer of Steps: 2 Home Layout: One level Home Equipment: Grab bars - tub/shower;Grab bars - toilet      Prior Function Level of Independence: Needs assistance         Comments: Daughter present and provided history.  Reports pt able to ambulate without AD and that her and pt's sister come into provide supervision for showers.  Reports pt's spouse tries to assist with medication management but is not always  accurate.     Hand Dominance        Extremity/Trunk Assessment   Upper Extremity Assessment Upper Extremity Assessment: Overall WFL for tasks assessed    Lower Extremity Assessment Lower Extremity Assessment: Overall WFL for tasks assessed    Cervical / Trunk Assessment Cervical / Trunk Assessment: Normal  Communication   Communication: No  difficulties  Cognition Arousal/Alertness: Awake/alert Behavior During Therapy: WFL for tasks assessed/performed Overall Cognitive Status: History of cognitive impairments - at baseline                                 General Comments: Pt is very confused.  Repeatedly talks about friend "Annette Stable" from [employer] that likes to cook.  She was pleasant and followed commands.  Daughter present and reports cognition at baseline.      General Comments General comments (skin integrity, edema, etc.): Pt's IV in L hand did not appear overly intact upon arrival and fell out during transfers - notified RN and covered with gauze    Exercises     Assessment/Plan    PT Assessment Patent does not need any further PT services  PT Problem List         PT Treatment Interventions      PT Goals (Current goals can be found in the Care Plan section)  Acute Rehab PT Goals Patient Stated Goal: return home PT Goal Formulation: All assessment and education complete, DC therapy    Frequency     Barriers to discharge        Co-evaluation PT/OT/SLP Co-Evaluation/Treatment: Yes Reason for Co-Treatment: For patient/therapist safety;Other (comment) (per chart confused/not following commands) PT goals addressed during session: Mobility/safety with mobility OT goals addressed during session: ADL's and self-care       AM-PAC PT "6 Clicks" Mobility  Outcome Measure Help needed turning from your back to your side while in a flat bed without using bedrails?: A Little Help needed moving from lying on your back to sitting on the side of a flat bed without using bedrails?: A Little Help needed moving to and from a bed to a chair (including a wheelchair)?: A Little Help needed standing up from a chair using your arms (e.g., wheelchair or bedside chair)?: A Little Help needed to walk in hospital room?: A Little Help needed climbing 3-5 steps with a railing? : A Little 6 Click Score: 18    End  of Session Equipment Utilized During Treatment: Gait belt Activity Tolerance: Patient tolerated treatment well Patient left: in bed;with call bell/phone within reach;with family/visitor present;Other (comment) Nutritional therapist on) Nurse Communication: Mobility status      Time: 1208-1230 PT Time Calculation (min) (ACUTE ONLY): 22 min   Charges:   PT Evaluation $PT Eval Low Complexity: 1 Low          Loyola Santino, PT Acute Rehab Services Pager [phone] [facility] [phone]    Rayetta Humphrey 01/09/2020, 12:51 PM

## 2020-01-09 NOTE — ED Notes (Signed)
Patient is very confused trying to get out of bed , daughter at bedside.  She wanted MD called to get something to help her sleep, spoke with Dr. Burna Mortimer, states he will be in to see the patient very soon. Patient is wanting to go home.

## 2020-01-09 NOTE — ED Notes (Signed)
Lunch Tray Ordered @ 1045 

## 2020-01-09 NOTE — ED Notes (Signed)
Patient is continuing to get out of bed confused , very difficult to redirect.

## 2020-01-09 NOTE — Progress Notes (Addendum)
STROKE TEAM PROGRESS NOTE   INTERVAL HISTORY Patient is resting in bed comfortably. No family at bedside. She is able to participate in the physical exam limitedly due to dementia.   OBJECTIVE:   Vitals:   01/09/20 0400 01/09/20 0415 01/09/20 0749 01/09/20 1200  BP: 133/71 (!) 141/68 (!) 150/64 (!) 166/65  Pulse: (!) 54 (!) 54 82 82  Resp: 15 14 16 15   Temp:   (!) 97.4 F (36.3 C)   TempSrc:   Oral   SpO2: 99% 99% 99% 100%  Weight:      Height:        Lipid Panel:  Recent Labs  Lab 01/09/20 0549  CHOL 138  TRIG 92  HDL 45  CHOLHDL 3.1  VLDL 18  LDLCALC 75   HgbA1c:  Recent Labs  Lab 01/09/20 0500  HGBA1C 6.1*   Urine Drug Screen: No results for input(s): LABOPIA, COCAINSCRNUR, LABBENZ, AMPHETMU, THCU, LABBARB in the last 168 hours.  Alcohol Level No results for input(s): ETH in the last 168 hours.  Pertinent Imaging:   01/08/20 CT Head WO Contrast  Ventricular enlargement and atrophy which is most prominent the frontal and temporal lobes. No acute abnormality.  01/09/20 CT Angio Head W WO Contrast  1. No intracranial large vessel occlusion. 2. Intracranial atherosclerotic disease with multifocal stenoses, most notably as follows. 3. Sites of up to moderate stenosis within the cavernous and paraclinoid right ICA. 4. Up to moderate stenosis within the cavernous left ICA. 5. High-grade stenosis within a left PCA branch at the P2/P3 junction. 6. 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  01/08/20 MRI Brain WO Contrast  1. No acute intracranial abnormality. 2. Findings of chronic microvascular ischemia and generalized volume Loss.  01/09/20 Echo  Complete WO Imaging Enhancing Agent   1. Left ventricular ejection fraction, by estimation, is 55 to 60%. The  left ventricle has normal function. The left ventricle has no regional  wall motion abnormalities. Left ventricular diastolic parameters are  consistent with Grade II diastolic   dysfunction (pseudonormalization).   2. Right ventricular systolic function is normal. The right ventricular  size is normal. There is moderately elevated pulmonary artery systolic  pressure.   3. Right atrial size was moderately dilated.   4. The mitral valve is normal in structure. Trivial mitral valve  regurgitation. No evidence of mitral stenosis.   5. Tricuspid valve regurgitation is moderate.   6. The aortic valve is tricuspid. There is mild calcification of the  aortic valve. There is mild thickening of the aortic valve. Aortic valve  regurgitation is not visualized. No aortic stenosis is present.   7. The inferior vena cava is normal in size with greater than 50%  respiratory variability, suggesting right atrial pressure of 3 mmHg.   01/09/20 VAS 03/08/20 Carotid Duplex Bilateral  Right Carotid: Velocities in the right ICA are consistent with a 1-39%  stenosis.  Left Carotid: Velocities in the left ICA are consistent with a 1-39%  stenosis.   Vertebrals: Right vertebral artery demonstrates antegrade flow. Left  vertebral artery was not visualized.   Subclavians: Normal flow hemodynamics were seen in bilateral subclavian arteries.   PHYSICAL EXAM Constitutional: NAD  MS: Oriented to self only, follows simple on step commands .  Diminished attention, registration and recall.  Cannot stay on target and gets easily distractible.  Poor insight into her condition. CN: EOMI, Difficult to assess VF due to comprehension, Face symmetric, Tongue midline  Motor: Unable to do formal strength testing due to cognition, patient with spontaneous movement throughout and all extremities are antigravity  Sensation: Intact to light tough throughout  Coordination: No ataxia with FNF  Gait: Deferred  ASSESSMENT/PLAN Donna [name] is a 85 y.o. [name] w/pmh of atrial fibrillation on Eliquis, dementia, HTN, CAD, Diastolic HF, CKD who presents with an episode of garbled speech that completely resolved  PTA. Her stroke work up has been completed at this time aside from an echocardiogram that is pending. Her presenting symptoms are compelling for a TIA. It is uncertain if the patient is compliant with her medications, including her Eliquis for atrial fibrillation, given her dementia and the fact that her husband is an alcoholic whom can not care for himself and forgets to give the patient her medications. At this time it is recommended to continue with Eliquis and Atorvastatin for secondary stroke prevention. Social work was consulted this admission to assist the patient with the social barriers that are impacting her health.   Stroke: Probable left hemispheric TIA-cardioembolic etiology from A. fib unsure about patient's compliance with anticoagulation usage CT Head: No acute abnormality. Ventricular enlargement and atrophy which is most prominent in the frontal and temporal lobes.  CTA Head W WO Contrast w/no intracranial LVO + intracranial atherosclerosis ( please view full report above)  MRI: NAICP + Chronic microvascular ischemia and generalized volume loss. Carotid Doppler: Right ICA with 1-39 % stenosis, velocities in the left ICA with a 1-39 stenosis, right vertebral artery with antegrade flow, left vertebral artery not visualized.  2D Echo: EF 55 to 60 %, Right atrium moderately dilated, left ventricle with normal function ( please view full report above)   LDL 75 HgbA1c 6.1 VTE prophylaxis: Eliquis 2.5 mg BID ( resumed for atrial fibrillation)     Diet   Diet heart healthy/carb modified Room service appropriate? Yes; Fluid consistency: Thin   Eliquis (apixaban) daily prior to admission, now on Eliquis (apixaban) daily.  Therapy recommendations: Supervision/assistance 24 hours ( could benefit from [facility] nursing to assist with medication management)  Disposition: Home with [facility]   Hypertension Home meds: Amlodipine 10 mg, Furosemide 20 mg QD, Metoprolol 25 MG 24 hr tablet  Given patient has  had a TIA, can normalize blood pressure with long term goal < 130/80 Recommend primary team to re initiate blood pressure medications at discharge   Hyperlipidemia Home meds: Atorvastatin 10 mg resumed this admission  LDL 75, goal < 70 Continue statin at discharge  Prediabetes  Home meds: Metformin 500 mg 24 hour tablet  HgbA1c 6.1, goal < 7.0  Other Stroke Risk Factors Advanced Age >/= 42  Coronary artery disease Congestive heart failure   Hospital day # 0  Ruta Hinds, NP  [facility] Nurse Practitioner  Patient seen and discussed with attending physician Dr. Leonie Man   Neurology will sign off at this time. Please contact via Amion with any questions.   I have personally obtained history,examined this patient, reviewed notes, independently viewed imaging studies, participated in medical decision making and plan of care.ROS completed by me personally and pertinent positives fully documented  I have made any additions or clarifications directly to the above note. Agree with note above.. Patient presented with transient episode of garbled speech and has atrial fibrillation and is supposed to be on Eliquis but she has significant dementia at baseline and apparently her husband is alcoholic and we are not sure about patient's compliance with medications.  MRI is negative  for stroke.  Recommend social work consult to evaluate patient's home situation to see if help with medications if necessary.  Continue aggressive risk factor modification.  Discussed with Dr. Loleta Books.  Greater than 50% time during this 25-minute visit was spent on counseling and coordination of care about TIA and atrial fibrillation and stroke risk and answering questions.  Antony Contras, MD Medical Director [place] Pager: [phone] 01/09/2020 3:57 PM   To contact Stroke Continuity provider, please refer to [email or web address] After hours, contact General Neurology

## 2020-01-09 NOTE — ED Notes (Signed)
Dr. Burna Mortimer at bedside.

## 2020-01-09 NOTE — ED Notes (Signed)
Transported to CT 

## 2020-01-09 NOTE — Discharge Summary (Signed)
Physician Discharge Summary  SHATERICA KUSAK [id number] DOB: 13-May-1935 DOA: 01/08/2020  PCP: Josetta Huddle, MD  Admit date: 01/08/2020 Discharge date: 01/09/2020  Admitted From: Home  Disposition:  Home   Recommendations for Outpatient Follow-up:  1. Follow up with PCP in 1-2 weeks 2. Follow up with Neurology in 4-6 weeks 3. Cardiology: Please review below about patient infrequent adherence to her medication, in case this would influence treatment decisions re: [place]: RN for medication management  Equipment/Devices: None  Discharge Condition: Good  CODE STATUS: FULL Diet recommendation: Cardiac  Brief/Interim Summary: Donna [name] is a 85 y.o. F with dementia, home dwelling, CAD, Renal artery stenosis, dCHF, CKD IIIa, HTN and Afib on Eliquis who presented with transient slurred speech and facial droop.   In the ER, symptoms resolved.  CT head unremarkable.          PRINCIPAL HOSPITAL DIAGNOSIS: TIA    Discharge Diagnoses:   TIA Patient was admitted, MRI showed no acute infarction.    Daughter was able to detail that the patient's husband has alcoholism and is not a reliable medication provider.  Between the daughter and a once a week hired aide, the patient probably only gets about a quarter of her medicines.  TIA in the setting of probable nonadherence to Eliquis.  -Non-invasive angiography showed scattered moderate atherosclerosis in numerous vascular beds -Echocardiogram showed no cardiogenic source of embolism -Carotid imaging unremarkable   -Lipids ordered: discharged on statin -Aspirin not ordered due to on blood thinner already -Atrial fibrillation: Longstanding, patient nonadherent to medication, resumed Eliquis -tPA not given because of symptoms resolved -Dysphagia screen ordered in ER -PT eval ordered: recommended no PT follow-up -Smoking cessation: Not pertinent    Dementia At baseline  Atrial fibrillation, permanent Patient  only partially adherent to Tikosyn, Eliquis.  Coronary disease Peripheral vascular disease Chronic diastolic CHF CKD stage IIIa          Discharge Instructions  Discharge Instructions    Ambulatory referral to Neurology   Complete by: As directed    An appointment is requested in approximately: 4 weeks   Diet - low sodium heart healthy   Complete by: As directed    Discharge instructions   Complete by: As directed    From Dr. Nelva Bush were admitted for a mini stroke or TIA This was likely a small "embolic" stroke due to atrial fibrillation.  Here, your other testing was reassuring (the ultrasound of your heart showed normal squeeze and no abnormal clots or sources of embolism, the ultrasound of your neck showed those arteries are clear, and the CT scan of your head showed some fatty plaque buildup, but likely nothing that can be "fixed" and so the best thing is to continue your Eliquis and cholesterol medicine Lipitor and follow up with your PCP).   Resume your home medicines Continue to use the pill divider to promote adherence    Follow up with your PCP in 1 week  Follow up with Neurology in 4-6 weeks, this referral to [facility] has been made, they will contact you for the appointment   Increase activity slowly   Complete by: As directed      Allergies as of 01/09/2020      Reactions   Tagamet [cimetidine] Hives   Diovan [valsartan] Other (See Comments)   Hair loss      Medication List    TAKE these medications   amLODipine 10 MG tablet Commonly known  as: NORVASC Take 1 tablet (10 mg total) by mouth daily. What changed: when to take this   apixaban 2.5 MG Tabs tablet Commonly known as: Eliquis Take 1 tablet (2.5 mg total) by mouth 2 (two) times daily.   atorvastatin 10 MG tablet Commonly known as: LIPITOR Take 1 tablet (10 mg total) by mouth daily. What changed: when to take this   dofetilide 125 MCG capsule Commonly known as:  TIKOSYN TAKE 3 CAPSULES BY MOUTH TWICE DAILY   fluticasone 50 MCG/ACT nasal spray Commonly known as: FLONASE Place 1 spray into both nostrils daily as needed for allergies or rhinitis.   furosemide 20 MG tablet Commonly known as: LASIX Take 1 tablet by mouth once daily   magnesium oxide 400 MG tablet Commonly known as: MAG-OX Take 1 tablet (400 mg total) by mouth every morning.   metFORMIN 500 MG 24 hr tablet Commonly known as: GLUCOPHAGE-XR Take 500 mg by mouth at bedtime.   metoprolol succinate 25 MG 24 hr tablet Commonly known as: Toprol XL Take 0.5 tablets (12.5 mg total) by mouth daily.   mirtazapine 15 MG tablet Commonly known as: REMERON Take 15 mg by mouth daily.   mirtazapine 30 MG tablet Commonly known as: REMERON Take 30 mg by mouth at bedtime.   multivitamin with minerals Tabs tablet Take 1 tablet by mouth daily.   Namzaric 28-10 MG Cp24 Generic drug: Memantine HCl-Donepezil HCl Take 1 capsule by mouth at bedtime.   nitroGLYCERIN 0.4 MG SL tablet Commonly known as: Nitrostat Place 1 tablet (0.4 mg total) under the tongue every 5 (five) minutes as needed for chest pain.   potassium chloride SA 20 MEQ tablet Commonly known as: Klor-Con M20 Take 1 tablet (20 mEq total) by mouth daily.   VITAMIN D3 PO Take 1 tablet by mouth daily.   vitamin E 180 MG (400 UNITS) capsule Take 400 Units by mouth daily.       Follow-up Information    [facility] Follow up.   Specialty: [place] [facility]: Home Health RN-agency will call to arrange appointment Contact information: [address] [place] [address] [phone]        Josetta Huddle, MD. Schedule an appointment as soon as possible for a visit in 1 week(s).   Specialty: Internal Medicine Contact information: [address] [place] [address] [phone]        [facility]. Go in 1 month(s).   Contact information: [address]      [address] [place] [phone]             Allergies  Allergen Reactions  . Tagamet [Cimetidine] Hives  . Diovan [Valsartan] Other (See Comments)    Hair loss    Consultations:  Neurology   Procedures/Studies: CT ANGIO HEAD W OR WO CONTRAST  Result Date: 01/09/2020 CLINICAL DATA:  Transient ischemic attack (TIA). Additional provided: Episode of slurred speech lasting 10 minutes, history of atrial fibrillation on Eliquis. EXAM: CT ANGIOGRAPHY HEAD TECHNIQUE: Multidetector CT imaging of the head was performed using the standard protocol during bolus administration of intravenous contrast. Multiplanar CT image reconstructions and MIPs were obtained to evaluate the vascular anatomy. CONTRAST:  109mL OMNIPAQUE IOHEXOL 350 MG/ML SOLN COMPARISON:  Noncontrast head CT 01/08/2020.  Brain MRI 01/08/2020. FINDINGS: CT HEAD Brain: Redemonstrated frontal and temporal lobe predominant cerebral atrophy. Associated prominence of the ventricles and sulci. Comparatively mild cerebellar atrophy. Redemonstrated chronic lacunar infarct  within the right corona radiata (series 5, image 19). Stable background mild ill-defined hypoattenuation within the cerebral white matter which is nonspecific, but compatible with chronic small vessel ischemic disease. There is no acute intracranial hemorrhage. No demarcated cortical infarct. No extra-axial fluid collection. No evidence of intracranial mass. No midline shift. Vascular: Reported below. Skull: Normal. Negative for fracture or focal lesion. Sinuses: No significant paranasal sinus disease at the imaged levels. Orbits: No mass or acute finding. CTA HEAD Anterior circulation: The intracranial internal carotid arteries are patent. Calcified plaque within both vessels. Up to moderate stenosis within the cavernous/paraclinoid right ICA and within the cavernous left ICA. The M1 middle cerebral arteries are patent. Atherosclerotic irregularity of  the M2 and more distal MCA branches bilaterally. No M2 proximal branch occlusion or high-grade proximal stenosis is identified. The anterior cerebral arteries are patent. 2 mm inferiorly projecting vascular protrusion arising from the paraclinoid right ICA which may reflect an aneurysm or infundibulum (series 13, image 95). Posterior circulation: The intracranial vertebral arteries are patent. The basilar artery is patent. The posterior cerebral arteries are patent. High-grade stenosis within a left PCA branch at the P2/P3 junction (series 14, image 21) (series 16, image 24). Posterior communicating arteries are hypoplastic or absent bilaterally. Venous sinuses: Poorly assessed due to contrast timing. Anatomic variants: As described IMPRESSION: CT head: 1. No evidence of acute intracranial abnormality. 2. Stable parenchymal atrophy and chronic small vessel ischemic disease. CTA head: 1. No intracranial large vessel occlusion. 2. Intracranial atherosclerotic disease with multifocal stenoses, most notably as follows. 3. Sites of up to moderate stenosis within the cavernous and paraclinoid right ICA. 4. Up to moderate stenosis within the cavernous left ICA. 5. High-grade stenosis within a left PCA branch at the P2/P3 junction. 6. 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 Electronically Signed   By: Kellie Simmering DO   On: 01/09/2020 11:56   CT HEAD WO CONTRAST  Result Date: 01/08/2020 CLINICAL DATA:  TIA.  Slurred speech EXAM: CT HEAD WITHOUT CONTRAST TECHNIQUE: Contiguous axial images were obtained from the base of the skull through the vertex without intravenous contrast. COMPARISON:  CT head 02/15/2018 FINDINGS: Brain: Cerebral atrophy most prominent in the frontal and temporal lobes. Ventricular enlargement consistent with atrophy is stable. Negative for acute infarct, hemorrhage, mass Vascular: Negative for hyperdense vessel Skull: Negative Sinuses/Orbits: Negative Other: None  IMPRESSION: Ventricular enlargement and atrophy which is most prominent the frontal and temporal lobes. No acute abnormality. Electronically Signed   By: Franchot Gallo M.D.   On: 01/08/2020 19:34   MR BRAIN WO CONTRAST  Result Date: 01/08/2020 CLINICAL DATA:  Transient ischemic attack EXAM: MRI HEAD WITHOUT CONTRAST TECHNIQUE: Multiplanar, multiecho pulse sequences of the brain and surrounding structures were obtained without intravenous contrast. COMPARISON:  09/07/2014 FINDINGS: Brain: No acute infarct, mass effect or extra-axial collection. No acute or chronic hemorrhage. There is multifocal hyperintense T2-weighted signal within the white matter. Generalized volume loss without a clear lobar predilection. The midline structures are normal. Vascular: Major flow voids are preserved. Skull and upper cervical spine: Upper cervical spondylosis. Normal calvarium. Sinuses/Orbits:No paranasal sinus fluid levels or advanced mucosal thickening. No mastoid or middle ear effusion. Normal orbits. IMPRESSION: 1. No acute intracranial abnormality. 2. Findings of chronic microvascular ischemia and generalized volume loss. Electronically Signed   By: Ulyses Jarred M.D.   On: 01/08/2020 22:15   ECHOCARDIOGRAM COMPLETE  Result Date: 01/09/2020    ECHOCARDIOGRAM REPORT   Patient Name:  Donna [name] Date of Exam: 01/09/2020 Medical Rec #:  [id number]      Height:       62.0 in Accession #:    [id number]     Weight:       120.8 lb Date of Birth:  16-Aug-1935      BSA:          1.543 m Patient Age:    43 years       BP:           150/64 mmHg Patient Gender: F              HR:           58 bpm. Exam Location:  Inpatient Procedure: 2D Echo, Cardiac Doppler and Color Doppler Indications:    TIA 435.9 / G45.9  History:        Patient has prior history of Echocardiogram examinations, most                 recent 08/09/2014. CAD, Arrythmias:Atrial Fibrillation and Atrial                 Flutter; Risk Factors:Hypertension and Non-Smoker.  MR. TR.  Sonographer:    Vickie Epley RDCS Referring Phys: [id number] Candace Gallus [facility]  Sonographer Comments: Image acquisition challenging due to respiratory motion. IMPRESSIONS  1. Left ventricular ejection fraction, by estimation, is 55 to 60%. The left ventricle has normal function. The left ventricle has no regional wall motion abnormalities. Left ventricular diastolic parameters are consistent with Grade II diastolic dysfunction (pseudonormalization).  2. Right ventricular systolic function is normal. The right ventricular size is normal. There is moderately elevated pulmonary artery systolic pressure.  3. Right atrial size was moderately dilated.  4. The mitral valve is normal in structure. Trivial mitral valve regurgitation. No evidence of mitral stenosis.  5. Tricuspid valve regurgitation is moderate.  6. The aortic valve is tricuspid. There is mild calcification of the aortic valve. There is mild thickening of the aortic valve. Aortic valve regurgitation is not visualized. No aortic stenosis is present.  7. The inferior vena cava is normal in size with greater than 50% respiratory variability, suggesting right atrial pressure of 3 mmHg. FINDINGS  Left Ventricle: Left ventricular ejection fraction, by estimation, is 55 to 60%. The left ventricle has normal function. The left ventricle has no regional wall motion abnormalities. The left ventricular internal cavity size was normal in size. There is  no left ventricular hypertrophy. Left ventricular diastolic parameters are consistent with Grade II diastolic dysfunction (pseudonormalization). Indeterminate filling pressures. Right Ventricle: The right ventricular size is normal. No increase in right ventricular wall thickness. Right ventricular systolic function is normal. There is moderately elevated pulmonary artery systolic pressure. The tricuspid regurgitant velocity is 3.76 m/s, and with an assumed right atrial pressure of 3 mmHg, the estimated right  ventricular systolic pressure is 0000000 mmHg. Left Atrium: Left atrial size was normal in size. Right Atrium: Right atrial size was moderately dilated. Pericardium: There is no evidence of pericardial effusion. Mitral Valve: The mitral valve is normal in structure. Trivial mitral valve regurgitation. No evidence of mitral valve stenosis. Tricuspid Valve: The tricuspid valve is normal in structure. Tricuspid valve regurgitation is moderate . No evidence of tricuspid stenosis. Aortic Valve: The aortic valve is tricuspid. There is mild calcification of the aortic valve. There is mild thickening of the aortic valve. Aortic valve regurgitation is not visualized. No aortic stenosis is present. Pulmonic Valve: The  pulmonic valve was normal in structure. Pulmonic valve regurgitation is mild. No evidence of pulmonic stenosis. Aorta: The aortic root is normal in size and structure. Venous: The inferior vena cava is normal in size with greater than 50% respiratory variability, suggesting right atrial pressure of 3 mmHg. IAS/Shunts: No atrial level shunt detected by color flow Doppler.  LEFT VENTRICLE PLAX 2D LVIDd:         3.70 cm     Diastology LVIDs:         2.50 cm     LV e' medial:    5.11 cm/s LV PW:         0.80 cm     LV E/e' medial:  11.9 LV IVS:        0.80 cm     LV e' lateral:   6.41 cm/s LVOT diam:     1.80 cm     LV E/e' lateral: 9.5 LV SV:         32 LV SV Index:   21 LVOT Area:     2.54 cm  LV Volumes (MOD) LV vol d, MOD A2C: 53.7 ml LV vol d, MOD A4C: 56.1 ml LV vol s, MOD A2C: 21.4 ml LV vol s, MOD A4C: 26.5 ml LV SV MOD A2C:     32.3 ml LV SV MOD A4C:     56.1 ml LV SV MOD BP:      31.0 ml RIGHT VENTRICLE RV S prime:     11.20 cm/s TAPSE (M-mode): 1.8 cm LEFT ATRIUM             Index       RIGHT ATRIUM           Index LA diam:        3.80 cm 2.46 cm/m  RA Area:     17.60 cm LA Vol (A2C):   42.8 ml 27.74 ml/m RA Volume:   51.50 ml  33.38 ml/m LA Vol (A4C):   39.5 ml 25.60 ml/m LA Biplane Vol: 41.7 ml 27.02  ml/m  AORTIC VALVE LVOT Vmax:   55.00 cm/s LVOT Vmean:  36.500 cm/s LVOT VTI:    0.125 m  AORTA Ao Root diam: 2.70 cm MITRAL VALVE               TRICUSPID VALVE MV Area (PHT): 4.39 cm    TR Peak grad:   56.6 mmHg MV Decel Time: 173 msec    TR Vmax:        376.00 cm/s MV E velocity: 60.60 cm/s MV A velocity: 37.00 cm/s  SHUNTS MV E/A ratio:  1.64        Systemic VTI:  0.12 m                            Systemic Diam: 1.80 cm Eleonore Chiquito MD Electronically signed by Eleonore Chiquito MD Signature Date/Time: 01/09/2020/1:58:43 PM    Final    VAS US CAROTID (at [facility] and [facility] only)  Result Date: 01/09/2020 Carotid Arterial Duplex Study Indications:       TIA. Risk Factors:      Hypertension. Limitations        Today's exam was limited due to History of Alzheimer                    dementia, unable to cooperate. Comparison Study:  Most recent prior 06-22-2019 showed 1-39% stenosis  bilaterally. Performing Technologist: Jean Rosenthal RDMS  Examination Guidelines: A complete evaluation includes B-mode imaging, spectral Doppler, color Doppler, and power Doppler as needed of all accessible portions of each vessel. Bilateral testing is considered an integral part of a complete examination. Limited examinations for reoccurring indications may be performed as noted.  Right Carotid Findings: +----------+--------+--------+--------+------------------+------------------+           PSV cm/sEDV cm/sStenosisPlaque DescriptionComments           +----------+--------+--------+--------+------------------+------------------+ CCA Prox  65      11                                intimal thickening +----------+--------+--------+--------+------------------+------------------+ CCA Distal38      10                                intimal thickening +----------+--------+--------+--------+------------------+------------------+ ICA Prox  31      10      1-39%   smooth                                +----------+--------+--------+--------+------------------+------------------+ ICA Distal73      18                                                   +----------+--------+--------+--------+------------------+------------------+ ECA       78                                                           +----------+--------+--------+--------+------------------+------------------+ +----------+--------+-------+----------------+-------------------+           PSV cm/sEDV cmsDescribe        Arm Pressure (mmHG) +----------+--------+-------+----------------+-------------------+ Subclavian162            Multiphasic, WNL                    +----------+--------+-------+----------------+-------------------+ +---------+--------+--+--------+--+---------+ VertebralPSV cm/s39EDV cm/s10Antegrade +---------+--------+--+--------+--+---------+  Left Carotid Findings: +----------+--------+--------+--------+------------------+------------------+           PSV cm/sEDV cm/sStenosisPlaque DescriptionComments           +----------+--------+--------+--------+------------------+------------------+ CCA Prox  67      14                                intimal thickening +----------+--------+--------+--------+------------------+------------------+ CCA Distal58      10                                intimal thickening +----------+--------+--------+--------+------------------+------------------+ ICA Prox  72      16      1-39%   calcific                             +----------+--------+--------+--------+------------------+------------------+ ICA Distal66      16                                                   +----------+--------+--------+--------+------------------+------------------+  ECA       69                                                           +----------+--------+--------+--------+------------------+------------------+  +----------+--------+--------+----------------+-------------------+           PSV cm/sEDV cm/sDescribe        Arm Pressure (mmHG) +----------+--------+--------+----------------+-------------------+ [id number]             Multiphasic, WNL                    +----------+--------+--------+----------------+-------------------+ +---------+--------+--------+--------------+ VertebralPSV cm/sEDV cm/sNot identified +---------+--------+--------+--------------+   Summary: Right Carotid: Velocities in the right ICA are consistent with a 1-39% stenosis. Left Carotid: Velocities in the left ICA are consistent with a 1-39% stenosis. Vertebrals:  Right vertebral artery demonstrates antegrade flow. Left vertebral              artery was not visualized. Subclavians: Normal flow hemodynamics were seen in bilateral subclavian              arteries. *See table(s) above for measurements and observations.  Electronically signed by Ruta Hinds MD on 01/09/2020 at 12:16:41 PM.    Final       Subjective: No aphasia, focal weakness, numbness, confusion, vomiting, diarrhea  Discharge Exam: Vitals:   01/09/20 1200 01/09/20 1428  BP: (!) 166/65 140/68  Pulse: 82 74  Resp: 15 15  Temp:  97.9 F (36.6 C)  SpO2: 100% 98%   Vitals:   01/09/20 0415 01/09/20 0749 01/09/20 1200 01/09/20 1428  BP: (!) 141/68 (!) 150/64 (!) 166/65 140/68  Pulse: (!) 54 82 82 74  Resp: 14 16 15 15   Temp:  (!) 97.4 F (36.3 C)  97.9 F (36.6 C)  TempSrc:  Oral  Oral  SpO2: 99% 99% 100% 98%  Weight:      Height:        General: Pt is alert, awake, not in acute distress Cardiovascular: RRR, nl S1-S2, no murmurs appreciated.   No LE edema.   Respiratory: Normal respiratory rate and rhythm.  CTAB without rales or wheezes. Abdominal: Abdomen soft and non-tender.  No distension or HSM.   Neuro/Psych: Strength symmetric in upper and lower extremities.  Judgment and insight appear impaired by moderate dementia, pleasant  affect.   The results of significant diagnostics from this hospitalization (including imaging, microbiology, ancillary and laboratory) are listed below for reference.     Microbiology: Recent Results (from the past 240 hour(s))  SARS CORONAVIRUS 2 (TAT 6-24 HRS) Nasopharyngeal Nasopharyngeal Swab     Status: None   Collection Time: 01/08/20  8:32 PM   Specimen: Nasopharyngeal Swab  Result Value Ref Range Status   SARS Coronavirus 2 NEGATIVE NEGATIVE Final    Comment: (NOTE) SARS-CoV-2 target nucleic acids are NOT DETECTED.  The SARS-CoV-2 RNA is generally detectable in upper and lower respiratory specimens during the acute phase of infection. Negative results do not preclude SARS-CoV-2 infection, do not rule out co-infections with other pathogens, and should not be used as the sole basis for treatment or other patient management decisions. Negative results must be combined with clinical observations, patient history, and epidemiological information. The expected result is Negative.  Fact Sheet for Patients: SugarRoll.be  Fact Sheet for Healthcare Providers: https://www.woods-mathews.com/  This test is  not yet approved or cleared by the [place] and  has been authorized for detection and/or diagnosis of SARS-CoV-2 by FDA under an Emergency Use Authorization (EUA). This EUA will remain  in effect (meaning this test can be used) for the duration of the COVID-19 declaration under Se ction 564(b)(1) of the Act, 21 U.S.C. section 360bbb-3(b)(1), unless the authorization is terminated or revoked sooner.  Performed at [facility], [place] [address], [place], [place] [address]      Labs: BNP (last 3 results) No results for input(s): BNP in the last 8760 hours. Basic Metabolic Panel: Recent Labs  Lab 01/08/20 1838 01/08/20 1929  NA 140 143  K 4.1 4.1  CL 108 107  CO2 22  --   GLUCOSE 153* 146*  BUN 20 25*  CREATININE  1.26* 1.10*  CALCIUM 10.0  --    Liver Function Tests: Recent Labs  Lab 01/08/20 1838  AST 28  ALT 24  ALKPHOS 65  BILITOT 0.6  PROT 7.5  ALBUMIN 4.0   No results for input(s): LIPASE, AMYLASE in the last 168 hours. No results for input(s): AMMONIA in the last 168 hours. CBC: Recent Labs  Lab 01/08/20 1838 01/08/20 1929  WBC 5.7  --   NEUTROABS 3.4  --   HGB 15.0 15.3*  HCT 44.0 45.0  MCV 95.0  --   PLT 193  --    Cardiac Enzymes: No results for input(s): CKTOTAL, CKMB, CKMBINDEX, TROPONINI in the last 168 hours. BNP: Invalid input(s): POCBNP CBG: Recent Labs  Lab 01/08/20 2011  GLUCAP 113*   D-Dimer No results for input(s): DDIMER in the last 72 hours. Hgb A1c Recent Labs    01/09/20 0500  HGBA1C 6.1*   Lipid Profile Recent Labs    01/09/20 0549  CHOL 138  HDL 45  LDLCALC 75  TRIG 92  CHOLHDL 3.1   Thyroid function studies No results for input(s): TSH, T4TOTAL, T3FREE, THYROIDAB in the last 72 hours.  Invalid input(s): FREET3 Anemia work up No results for input(s): VITAMINB12, FOLATE, FERRITIN, TIBC, IRON, RETICCTPCT in the last 72 hours. Urinalysis    Component Value Date/Time   COLORURINE YELLOW 05/07/2016 1807   APPEARANCEUR HAZY (A) 05/07/2016 1807   LABSPEC 1.025 05/07/2016 1807   PHURINE 5.0 05/07/2016 1807   GLUCOSEU NEGATIVE 05/07/2016 1807   HGBUR NEGATIVE 05/07/2016 1807   BILIRUBINUR NEGATIVE 05/07/2016 1807   KETONESUR 5 (A) 05/07/2016 1807   PROTEINUR 30 (A) 05/07/2016 1807   UROBILINOGEN 0.2 11/13/2014 1142   NITRITE NEGATIVE 05/07/2016 1807   LEUKOCYTESUR MODERATE (A) 05/07/2016 1807   Sepsis Labs Invalid input(s): PROCALCITONIN,  WBC,  LACTICIDVEN Microbiology Recent Results (from the past 240 hour(s))  SARS CORONAVIRUS 2 (TAT 6-24 HRS) Nasopharyngeal Nasopharyngeal Swab     Status: None   Collection Time: 01/08/20  8:32 PM   Specimen: Nasopharyngeal Swab  Result Value Ref Range Status   SARS Coronavirus 2 NEGATIVE  NEGATIVE Final    Comment: (NOTE) SARS-CoV-2 target nucleic acids are NOT DETECTED.  The SARS-CoV-2 RNA is generally detectable in upper and lower respiratory specimens during the acute phase of infection. Negative results do not preclude SARS-CoV-2 infection, do not rule out co-infections with other pathogens, and should not be used as the sole basis for treatment or other patient management decisions. Negative results must be combined with clinical observations, patient history, and epidemiological information. The expected result is Negative.  Fact Sheet for Patients: SugarRoll.be  Fact Sheet  for Healthcare Providers: https://www.woods-mathews.com/  This test is not yet approved or cleared by the [place] and  has been authorized for detection and/or diagnosis of SARS-CoV-2 by FDA under an Emergency Use Authorization (EUA). This EUA will remain  in effect (meaning this test can be used) for the duration of the COVID-19 declaration under Se ction 564(b)(1) of the Act, 21 U.S.C. section 360bbb-3(b)(1), unless the authorization is terminated or revoked sooner.  Performed at [facility], [place] [address], [place], [place] [address]      Time coordinating discharge: 45 minutes     SIGNED:   Edwin Dada, MD  [facility] 01/09/2020, 7:55 PM

## 2020-01-09 NOTE — Progress Notes (Signed)
Carotid duplex bilateral study completed.   Please see CV Proc for preliminary results.   Juanita Streight, RDMS  

## 2020-01-09 NOTE — Progress Notes (Signed)
  Echocardiogram 2D Echocardiogram has been performed.  Stark Bray Swaim 01/09/2020, 10:45 AM

## 2020-01-09 NOTE — TOC Initial Note (Addendum)
Transition of Care [facility]) - Initial/Assessment Note    Patient Details  Name: Donna [name] MRN: [id number] Date of Birth: [birth year]  Transition of [facility]) CM/SW Contact:    Donna Cousin, RN Phone Number: [phone] 01/09/2020, 1:37 PM  Clinical Narrative:                  TOC CM spoke to husband, Donna [name] and Donna [name] via phone. Offered choice for [facility]. Pt has [facility],  aide coming on Tuesday. Husband agreeable to [facility]. Contacted [facility] with new referral. Pt has RW. Dtr, Donna [name] will provide transportation home. [facility] unable to accept referral due to staffing issues.    [facility] has accepted referral for [facility] RN.    Expected Discharge Plan: Home w Home Health Services Barriers to Discharge: No Barriers Identified   Patient Goals and CMS Choice Patient states their goals for this hospitalization and ongoing recovery are:: husband is wanting patient to come home CMS Medicare.gov Compare Post Acute Care list provided to:: Patient Represenative (must comment) Donna [name] Murpy-husband) Choice offered to / list presented to : Spouse  Expected Discharge Plan and Services Expected Discharge Plan: Home w Home Health Services In-house Referral: Clinical Social Work Discharge Planning Services: CM Consult Post Acute Care Choice: Home Health                             HH Arranged: RN [facility] Agency: [facility] (formerly [facility]) Date HH Agency Contacted: 01/09/20 Time HH Agency Contacted: 1336 Representative spoke with at [facility] Agency: Donna [name]  Prior Living Arrangements/Services   Lives with:: Spouse Patient language and need for interpreter reviewed:: Yes Do you feel safe going back to the place where you live?: Yes      Need for Family Participation in Patient Care: Yes (Comment) Care giver support system in place?: Yes (comment) Current home services: DME (rolling walker, cane) Criminal Activity/Legal Involvement  Pertinent to Current Situation/Hospitalization: No - Comment as needed  Activities of Daily Living      Permission Sought/Granted   Permission granted to share information with : Yes, Verbal Permission Granted  Share Information with NAME: Donna [name]  Permission granted to share info w AGENCY: Home Health  Permission granted to share info w Relationship: husband  Permission granted to share info w Contact Information: [phone]  Emotional Assessment           Psych Involvement: No (comment)  Admission diagnosis:  TIA (transient ischemic attack) [G45.9] Patient Active Problem List   Diagnosis Date Noted  . TIA (transient ischemic attack) 01/08/2020  . Alzheimer disease (HCC) 06/27/2019  . Headache 12/21/2018  . Chronic anticoagulation 02/22/2015  . Chronic atrial fibrillation (HCC) 11/19/2014  . Chronic diastolic HF (heart failure) (HCC) 11/19/2014  . CKD (chronic kidney disease), stage III (HCC) 11/19/2014  . Fibromuscular dysplasia (HCC)   . Carotid arterial disease (HCC)   . Hypertension 05/28/2011  . Menopause 05/28/2011  . Atherosclerotic RAS (renal artery stenosis), bilateral (HCC) 06/28/2010   PCP:  Donna Noble, MD Pharmacy:   [facility] [address] [place] - [address] [place] [address] Phone: [phone] Fax: [phone]     Social Determinants of Health (SDOH) Interventions    Readmission Risk Interventions No flowsheet data found.

## 2020-01-11 DIAGNOSIS — N6019 Diffuse cystic mastopathy of unspecified breast: Secondary | ICD-10-CM | POA: Diagnosis not present

## 2020-01-11 DIAGNOSIS — Z8673 Personal history of transient ischemic attack (TIA), and cerebral infarction without residual deficits: Secondary | ICD-10-CM | POA: Diagnosis not present

## 2020-01-11 DIAGNOSIS — I130 Hypertensive heart and chronic kidney disease with heart failure and stage 1 through stage 4 chronic kidney disease, or unspecified chronic kidney disease: Secondary | ICD-10-CM | POA: Diagnosis not present

## 2020-01-11 DIAGNOSIS — Z7901 Long term (current) use of anticoagulants: Secondary | ICD-10-CM | POA: Diagnosis not present

## 2020-01-11 DIAGNOSIS — F0280 Dementia in other diseases classified elsewhere without behavioral disturbance: Secondary | ICD-10-CM | POA: Diagnosis not present

## 2020-01-11 DIAGNOSIS — I081 Rheumatic disorders of both mitral and tricuspid valves: Secondary | ICD-10-CM | POA: Diagnosis not present

## 2020-01-11 DIAGNOSIS — I773 Arterial fibromuscular dysplasia: Secondary | ICD-10-CM | POA: Diagnosis not present

## 2020-01-11 DIAGNOSIS — N183 Chronic kidney disease, stage 3 unspecified: Secondary | ICD-10-CM | POA: Diagnosis not present

## 2020-01-11 DIAGNOSIS — I701 Atherosclerosis of renal artery: Secondary | ICD-10-CM | POA: Diagnosis not present

## 2020-01-11 DIAGNOSIS — I480 Paroxysmal atrial fibrillation: Secondary | ICD-10-CM | POA: Diagnosis not present

## 2020-01-11 DIAGNOSIS — R32 Unspecified urinary incontinence: Secondary | ICD-10-CM | POA: Diagnosis not present

## 2020-01-11 DIAGNOSIS — G309 Alzheimer's disease, unspecified: Secondary | ICD-10-CM | POA: Diagnosis not present

## 2020-01-11 DIAGNOSIS — I5032 Chronic diastolic (congestive) heart failure: Secondary | ICD-10-CM | POA: Diagnosis not present

## 2020-01-11 NOTE — Progress Notes (Deleted)
CARDIOLOGY OFFICE NOTE  Date:  01/17/2020    [name] Date of Birth: 06/06/1935 Medical Record #[id number]  PCP:  Marden Noble, MD  Cardiologist:  Kyra Manges    No chief complaint on file.   History of Present Illness: Donna [name] is a 85 y.o. female who presents today for a follow up visit. Seen for Dr. Katrinka Blazing.    She has a history of atherosclerotic kidney disease, hypertension, PAF - on Tikosyn, chronic diastolic heart failure when in A. fib, and chronic anticoagulation therapy with Eliquis.    Last seen by Dr. Katrinka Blazing in November of 2017. Was doing ok at that visit. Then lost to follow up until February of 2019 - then seen by me and I have followed her since - medicines very unclear and this has been a recurrent theme.  I have also suspected medication non compliance as well from her husband due to alcoholism. She has had 1st degree AV block - Coreg has been cut back and subsequently stopped but have gotten back on low dose Toprol due to elevated HR. Needs patient assistance for her Tikosyn and Eliquis. Felt to be holding her own. Last seen in October - doing ok.   Admitted recently with TIA - most likely due to medical non compliance with her Eliquis - noted mention in the record about her Tikosyn not being taken as directed. Again, due to husband's alcoholism. Not sure how much her children play a role with her care.    Danford, Earl Lites, MD sent to Rosalio Macadamia, NP Felecia Shelling,  Pharmacy asked me to direct your attention to the daughter's concern that this patient only takes her medicine about 1/4 or 1/2 the time, given she is on Tikosyn.    THanks  [employer] in today. Here with   Past Medical History:  Diagnosis Date  . Alzheimer disease (HCC) 06/27/2019  . Atherosclerosis of renal artery (HCC)    a. s/p R RA stenting;  b. 04/2014 Renal Art duplex: Patent R RA stent, <60% L RA stenosis. Followed by [facility].  . Carotid arterial disease (HCC)     a. 01/2014 Carotid U/S: RICA 40%, LICA 40%. Followed by [facility].  . CKD (chronic kidney disease), stage III (HCC)    Stage II/III  . Essential hypertension   . Fibrocystic breast   . Fibromuscular dysplasia (HCC)   . Hyperglycemia   . Mitral regurgitation    a. Echo 08/2014: mild MR.  . Paroxysmal atrial fibrillation (HCC)    a. Dx 07/26/2014, CHA2DS2VASc = 5-->Eliquis;  c. 08/2014 Echo: EF 55-60%, no rwma, mildly dil LA/RA, mod-sev TR, PASP . b. s/p DCCV 08/2014. c. back in atrial flutter 09/2014 -> rate control pursued.  . Paroxysmal atrial flutter (HCC)    a. Dx 07/26/2014, CHA2DS2VASc = 5-->Eliquis;  c. 08/2014 Echo: EF 55-60%, no rwma, mildly dil LA/RA, mod-sev TR, PASP . b. s/p DCCV 08/2014. c. back in atrial flutter 09/2014 -> rate control pursued.  . Tricuspid regurgitation    a. Echo 08/2014: mod-severe.    Past Surgical History:  Procedure Laterality Date  . CARDIOVERSION N/A 09/01/2014   Procedure: CARDIOVERSION;  Surgeon: Wendall Stade, MD;  Location: [facility] ENDOSCOPY;  Service: Cardiovascular;  Laterality: N/A;  . CARDIOVERSION N/A 11/23/2014   Procedure: CARDIOVERSION;  Surgeon: Chilton Si, MD;  Location: [facility] ENDOSCOPY;  Service: Cardiovascular;  Laterality: N/A;  . HEMORROIDECTOMY    . REDUCTION  MAMMAPLASTY Bilateral   . RENAL ARTERY STENT  2008   Right renal artery by Dr. Drucie Opitz  . TONSILLECTOMY    . TUBAL LIGATION  1970's     Medications: No outpatient medications have been marked as taking for the 01/18/20 encounter (Appointment) with Burtis Junes, NP.     Allergies: Allergies  Allergen Reactions  . Tagamet [Cimetidine] Hives  . Diovan [Valsartan] Other (See Comments)    Hair loss    Social History: The patient  reports that she has never smoked. She has never used smokeless tobacco. She reports current alcohol use. She reports that she does not use drugs.   Family History: The patient's ***family history includes Aneurysm in her father; Cancer  (age of onset: 23) in her sister; Diabetes in her father and mother; Heart attack in her father; Hypertension in her father and mother; Stroke (age of onset: 18) in her mother.   Review of Systems: Please see the history of present illness.   All other systems are reviewed and negative.   Physical Exam: VS:  There were no vitals taken for this visit. Marland Kitchen  BMI There is no height or weight on file to calculate BMI.  Wt Readings from Last 3 Encounters:  01/08/20 120 lb 13 oz (54.8 kg)  10/12/19 120 lb 12.8 oz (54.8 kg)  09/27/19 122 lb 3.2 oz (55.4 kg)    General: Pleasant. Well developed, well nourished and in no acute distress.   HEENT: Normal.  Neck: Supple, no JVD, carotid bruits, or masses noted.  Cardiac: ***Regular rate and rhythm. No murmurs, rubs, or gallops. No edema.  Respiratory:  Lungs are clear to auscultation bilaterally with normal work of breathing.  GI: Soft and nontender.  MS: No deformity or atrophy. Gait and ROM intact.  Skin: Warm and dry. Color is normal.  Neuro:  Strength and sensation are intact and no gross focal deficits noted.  Psych: Alert, appropriate and with normal affect.   LABORATORY DATA:  EKG:  EKG {ACTION; IS/IS [id number] ordered today.  Personally reviewed by me. This demonstrates ***.  Lab Results  Component Value Date   WBC 5.7 01/08/2020   HGB 15.3 (H) 01/08/2020   HCT 45.0 01/08/2020   PLT 193 01/08/2020   GLUCOSE 146 (H) 01/08/2020   CHOL 138 01/09/2020   TRIG 92 01/09/2020   HDL 45 01/09/2020   LDLCALC 75 01/09/2020   ALT 24 01/08/2020   AST 28 01/08/2020   NA 143 01/08/2020   K 4.1 01/08/2020   CL 107 01/08/2020   CREATININE 1.10 (H) 01/08/2020   BUN 25 (H) 01/08/2020   CO2 22 01/08/2020   TSH 0.443 11/19/2014   INR 1.1 01/08/2020   HGBA1C 6.1 (H) 01/09/2020     BNP (last 3 results) No results for input(s): BNP in the last 8760 hours.  ProBNP (last 3 results) No results for input(s): PROBNP in the last 8760  hours.   Other Studies Reviewed Today:  ECHO IMPRESSIONS 01/2020   1. Left ventricular ejection fraction, by estimation, is 55 to 60%. The  left ventricle has normal function. The left ventricle has no regional  wall motion abnormalities. Left ventricular diastolic parameters are  consistent with Grade II diastolic  dysfunction (pseudonormalization).  2. Right ventricular systolic function is normal. The right ventricular  size is normal. There is moderately elevated pulmonary artery systolic  pressure.  3. Right atrial size was moderately dilated.  4. The mitral valve is normal in  structure. Trivial mitral valve  regurgitation. No evidence of mitral stenosis.  5. Tricuspid valve regurgitation is moderate.  6. The aortic valve is tricuspid. There is mild calcification of the  aortic valve. There is mild thickening of the aortic valve. Aortic valve  regurgitation is not visualized. No aortic stenosis is present.  7. The inferior vena cava is normal in size with greater than 50%  respiratory variability, suggesting right atrial pressure of 3 mmHg.     Carotid Doppler Summary 01/2020:  Right Carotid: Velocities in the right ICA are consistent with a 1-39%  stenosis.   Left Carotid: Velocities in the left ICA are consistent with a 1-39%  stenosis.   Vertebrals: Right vertebral artery demonstrates antegrade flow. Left  vertebral artery was not visualized.  Subclavians: Normal flow hemodynamics were seen in bilateral subclavian arteries.   *See table(s) above for measurements and observations.     Electronically signed by Ruta Hinds MD on 01/09/2020 at 12:16:41 PM.      Renal Duplex 06/2019:     Right: Evidence of a greater than 60% stenosis of the right renal         artery, essentially stable when compared to previous exam.         RRV flow present. Abnormal size for the right kidney. Normal         right Resisitive Index. Normal cortical thickness of right          kidney.  Left:  Evidence of a > 60% stenosis in the left renal artery,         essentially stable when compared to the previous exam. LRV         flow present. Normal size of left kidney. Normal left         Resistive Index. Normal cortical thickness of the left         kidney.  Mesenteric:  Normal Celiac artery and Superior Mesenteric artery findings. Areas of  limited  visceral study include right kidney size, right renal artery and left  renal  artery.     *See table(s) above for measurements and observations.     Suggest follow up study in 12 months.    Myoview Study Highlights 2016    There was no ST segment deviation noted during stress.  This is a low risk study.   Perfusion imaging shows no evidence of ischemia or scar. Study was not gated because of the atrial fibrillation. Consider echocardiogram to assess LV systolic function if needed.      Assessment/Plan:   1. PAF - on Tikosyn and Eliquis - her medicine compliance is always a little doubtful.    2. Prior tachycardia - ?AIVR - now back on some low dose Toprol - will cut this in half today.    3. Chronic 1st degree AV block - see #2.    4. HTN - BP is fine here today - no changes made.    5. Chronic anticoagulation - no problems noted.    6. Chronic diastolic dysfunction   7. Valvular heart disease - would follow - not symptomatic.   Current medicines are reviewed with the patient today.  The patient does not have concerns regarding medicines other than what has been noted above.  The following changes have been made:  See above.  Labs/ tests ordered today include:   No orders of the defined types were placed in this encounter.    Disposition:   FU with ***  in {gen number [id number] {Days to years:10300}.   Patient is agreeable to this plan and will call if any problems develop in the interim.   SignedTruitt Merle, NP  01/17/2020 9:53 AM  [place] [address] [place], [place]  [address] Phone: [phone] Fax: [phone]

## 2020-01-13 DIAGNOSIS — I480 Paroxysmal atrial fibrillation: Secondary | ICD-10-CM | POA: Diagnosis not present

## 2020-01-13 DIAGNOSIS — G309 Alzheimer's disease, unspecified: Secondary | ICD-10-CM | POA: Diagnosis not present

## 2020-01-13 DIAGNOSIS — I5032 Chronic diastolic (congestive) heart failure: Secondary | ICD-10-CM | POA: Diagnosis not present

## 2020-01-13 DIAGNOSIS — F0280 Dementia in other diseases classified elsewhere without behavioral disturbance: Secondary | ICD-10-CM | POA: Diagnosis not present

## 2020-01-13 DIAGNOSIS — I130 Hypertensive heart and chronic kidney disease with heart failure and stage 1 through stage 4 chronic kidney disease, or unspecified chronic kidney disease: Secondary | ICD-10-CM | POA: Diagnosis not present

## 2020-01-13 DIAGNOSIS — N183 Chronic kidney disease, stage 3 unspecified: Secondary | ICD-10-CM | POA: Diagnosis not present

## 2020-01-16 DIAGNOSIS — I480 Paroxysmal atrial fibrillation: Secondary | ICD-10-CM | POA: Diagnosis not present

## 2020-01-16 DIAGNOSIS — I5032 Chronic diastolic (congestive) heart failure: Secondary | ICD-10-CM | POA: Diagnosis not present

## 2020-01-16 DIAGNOSIS — G309 Alzheimer's disease, unspecified: Secondary | ICD-10-CM | POA: Diagnosis not present

## 2020-01-16 DIAGNOSIS — F0280 Dementia in other diseases classified elsewhere without behavioral disturbance: Secondary | ICD-10-CM | POA: Diagnosis not present

## 2020-01-16 DIAGNOSIS — G459 Transient cerebral ischemic attack, unspecified: Secondary | ICD-10-CM | POA: Diagnosis not present

## 2020-01-16 DIAGNOSIS — I130 Hypertensive heart and chronic kidney disease with heart failure and stage 1 through stage 4 chronic kidney disease, or unspecified chronic kidney disease: Secondary | ICD-10-CM | POA: Diagnosis not present

## 2020-01-16 DIAGNOSIS — N183 Chronic kidney disease, stage 3 unspecified: Secondary | ICD-10-CM | POA: Diagnosis not present

## 2020-01-17 ENCOUNTER — Telehealth: Payer: Self-pay | Admitting: Nurse Practitioner

## 2020-01-17 DIAGNOSIS — I130 Hypertensive heart and chronic kidney disease with heart failure and stage 1 through stage 4 chronic kidney disease, or unspecified chronic kidney disease: Secondary | ICD-10-CM | POA: Diagnosis not present

## 2020-01-17 DIAGNOSIS — N183 Chronic kidney disease, stage 3 unspecified: Secondary | ICD-10-CM | POA: Diagnosis not present

## 2020-01-17 DIAGNOSIS — I480 Paroxysmal atrial fibrillation: Secondary | ICD-10-CM | POA: Diagnosis not present

## 2020-01-17 DIAGNOSIS — I5032 Chronic diastolic (congestive) heart failure: Secondary | ICD-10-CM | POA: Diagnosis not present

## 2020-01-17 DIAGNOSIS — F0280 Dementia in other diseases classified elsewhere without behavioral disturbance: Secondary | ICD-10-CM | POA: Diagnosis not present

## 2020-01-17 DIAGNOSIS — G309 Alzheimer's disease, unspecified: Secondary | ICD-10-CM | POA: Diagnosis not present

## 2020-01-17 NOTE — Telephone Encounter (Signed)
Patients daughter [name] is requesting a call from [name] or her Nurse after her mothers appointment tomorrow 01/18/2020 @ 11:15am. Her contact info is [phone] and she is also on her DPR. Please advise.

## 2020-01-18 ENCOUNTER — Other Ambulatory Visit: Payer: Self-pay

## 2020-01-18 ENCOUNTER — Ambulatory Visit: Payer: Medicare Other | Admitting: Nurse Practitioner

## 2020-01-18 NOTE — Telephone Encounter (Signed)
Pt and spouse showed up today for appointment with [name], NP, pt was saturated with Urine.  Went out to waiting room to t/w pt's spouse and explain pt did not to keep this appt will schedule another appt and you can bring pt home to clean up.  Asked spouse if pt was wearing a depends??  Spouse explained someone was supposed to come to house this am and give pt a bath, no one showed up. Spouse stated pt  has depends on from last night.  Asked spouse how pt got to office today stated had transportation but could not tell name of service or driver, stated daughter arranged everything.  Spouse stated driver was downstairs waiting.  Brought pt downstairs and spouse, no driver to be seen.  According to the phone note, pt's daughter, [name] called in before appt so daughter could be called after appt to discuss pt health. Went upstairs to call [name] to state all the confusion and daughter stated did not know how this happened.  Went back downstairs to check on pt and driver was there.  Stayed with pt's till safely transported. Went back upstairs to call [name] back and stated one of the children need to come to pt's next appt.  Sydnee Cabal several dates for appt's  and will call [name] back tomorrow at 31 after discussing with the siblings  appt time and date. Cecille Rubin advised a family member besides spouse come to pt's next appt.

## 2020-01-19 NOTE — Telephone Encounter (Signed)
Patient's daughter calling back. She states she will be able to bring the patient 02/13/2020 at 3:15 pm or 02/15/2020 at 3:15 pm.

## 2020-01-19 NOTE — Telephone Encounter (Signed)
S/w pt's daughter peer (DPR). Scheduled appt for Donna Merle, NP for 2/7 @ 3:15.

## 2020-01-25 DIAGNOSIS — I5032 Chronic diastolic (congestive) heart failure: Secondary | ICD-10-CM | POA: Diagnosis not present

## 2020-01-25 DIAGNOSIS — N183 Chronic kidney disease, stage 3 unspecified: Secondary | ICD-10-CM | POA: Diagnosis not present

## 2020-01-25 DIAGNOSIS — G309 Alzheimer's disease, unspecified: Secondary | ICD-10-CM | POA: Diagnosis not present

## 2020-01-25 DIAGNOSIS — I480 Paroxysmal atrial fibrillation: Secondary | ICD-10-CM | POA: Diagnosis not present

## 2020-01-25 DIAGNOSIS — F0280 Dementia in other diseases classified elsewhere without behavioral disturbance: Secondary | ICD-10-CM | POA: Diagnosis not present

## 2020-01-25 DIAGNOSIS — I130 Hypertensive heart and chronic kidney disease with heart failure and stage 1 through stage 4 chronic kidney disease, or unspecified chronic kidney disease: Secondary | ICD-10-CM | POA: Diagnosis not present

## 2020-01-26 DIAGNOSIS — F0280 Dementia in other diseases classified elsewhere without behavioral disturbance: Secondary | ICD-10-CM | POA: Diagnosis not present

## 2020-01-26 DIAGNOSIS — I130 Hypertensive heart and chronic kidney disease with heart failure and stage 1 through stage 4 chronic kidney disease, or unspecified chronic kidney disease: Secondary | ICD-10-CM | POA: Diagnosis not present

## 2020-01-26 DIAGNOSIS — I5032 Chronic diastolic (congestive) heart failure: Secondary | ICD-10-CM | POA: Diagnosis not present

## 2020-01-26 DIAGNOSIS — I480 Paroxysmal atrial fibrillation: Secondary | ICD-10-CM | POA: Diagnosis not present

## 2020-01-26 DIAGNOSIS — G309 Alzheimer's disease, unspecified: Secondary | ICD-10-CM | POA: Diagnosis not present

## 2020-01-26 DIAGNOSIS — N183 Chronic kidney disease, stage 3 unspecified: Secondary | ICD-10-CM | POA: Diagnosis not present

## 2020-01-30 DIAGNOSIS — F0280 Dementia in other diseases classified elsewhere without behavioral disturbance: Secondary | ICD-10-CM | POA: Diagnosis not present

## 2020-01-30 DIAGNOSIS — I130 Hypertensive heart and chronic kidney disease with heart failure and stage 1 through stage 4 chronic kidney disease, or unspecified chronic kidney disease: Secondary | ICD-10-CM | POA: Diagnosis not present

## 2020-01-30 DIAGNOSIS — I480 Paroxysmal atrial fibrillation: Secondary | ICD-10-CM | POA: Diagnosis not present

## 2020-01-30 DIAGNOSIS — G309 Alzheimer's disease, unspecified: Secondary | ICD-10-CM | POA: Diagnosis not present

## 2020-01-30 DIAGNOSIS — I5032 Chronic diastolic (congestive) heart failure: Secondary | ICD-10-CM | POA: Diagnosis not present

## 2020-01-30 DIAGNOSIS — N183 Chronic kidney disease, stage 3 unspecified: Secondary | ICD-10-CM | POA: Diagnosis not present

## 2020-01-30 NOTE — Progress Notes (Signed)
CARDIOLOGY OFFICE NOTE  Date:  02/13/2020    Donna [name] Date of Birth: September 25, 1935 Medical Record [id number]  PCP:  Donna Huddle, MD  Cardiologist:  Donna [name]  Chief Complaint  Patient presents with  . Follow-up    Seen for Dr. Tamala [name]    History of Present Illness: Donna [name] is a 85 y.o. female who presents today for a follow up visit. Seen for Dr. Tamala [name].    She has a history of atherosclerotic kidney disease, hypertension, PAF - on Tikosyn, chronic diastolic heart failure when in A. fib, and chronic anticoagulation therapy with Eliquis.    Last seen by Dr. Tamala [name] in November of 2017. Was doing ok at that visit. Then lost to follow up until February of 2019 - then seen by me and I have followed her since - medicines very unclear and this has been a recurrent theme.  I have also suspected medication non compliance as well. She has had 1st degree AV block - Coreg has been cut back and subsequently stopped. When seen back in March - was doing ok - they had both been vaccinated. Needs patient assistance for her Tikosyn and Eliquis. Felt to be holding her own.    I saw her back in September - she was feeling good - but HR was higher - off Coreg since November - we elected to try low dose Toprol. Last seen in October - doing ok. Still concern for medication compliance.    Admitted in early January with TIA/stroke - had not been receiving her Eliquis correctly. Also concern about the Tikosyn - only getting this 1/4 to 1/2 the time.   Tried to see her back a few weeks ago for her post hospital visit - had come to the office - was covered in urine - visit was cancelled - very sad situation. Husband is an alcoholic - not giving her medicines correctly.   Comes in today. Here with her husband. Apparently he would not let the daughter come as had been arranged. He thinks he left his phone at home. He did not feel like it was necessary to have anyone else come. He is asking if  further testing needs to be done - explained that the crux of her care is to get her medicines are prescribed. She says she is ok. No chest pain. Not short of breath. Sounds like she is pretty sedentary.   Past Medical History:  Diagnosis Date  . Alzheimer disease ([place]) 06/27/2019  . Atherosclerosis of renal artery (HCC)    a. s/p R RA stenting;  b. 04/2014 Renal Art duplex: Patent R RA stent, <60% L RA stenosis. Followed by [facility].  . Carotid arterial disease ([place])    a. 01/2014 Carotid U/S: RICA [id number], LICA [id number]. Followed by [facility].  . CKD (chronic kidney disease), stage III (HCC)    Stage II/III  . Essential hypertension   . Fibrocystic breast   . Fibromuscular dysplasia ([place])   . Hyperglycemia   . Mitral regurgitation    a. Echo 08/2014: mild MR.  . Paroxysmal atrial fibrillation ([place])    a. Dx 07/26/2014, CHA2DS2VASc = 5-->Eliquis;  c. 08/2014 Echo: EF 55-60%, no rwma, mildly dil LA/RA, mod-sev TR, PASP 98mmHg. b. s/p DCCV 08/2014. c. back in atrial flutter 09/2014 -> rate control pursued.  . Paroxysmal atrial flutter ([place])    a. Dx 07/26/2014, CHA2DS2VASc = 5-->Eliquis;  c. 08/2014 Echo: EF 55-60%, no rwma, mildly dil  LA/RA, mod-sev TR, PASP 59mmHg. b. s/p DCCV 08/2014. c. back in atrial flutter 09/2014 -> rate control pursued.  . Tricuspid regurgitation    a. Echo 08/2014: mod-severe.    Past Surgical History:  Procedure Laterality Date  . CARDIOVERSION N/A 09/01/2014   Procedure: CARDIOVERSION;  Surgeon: Donna Hector, MD;  Location: [facility] ENDOSCOPY;  Service: Cardiovascular;  Laterality: N/A;  . CARDIOVERSION N/A 11/23/2014   Procedure: CARDIOVERSION;  Surgeon: Donna Latch, MD;  Location: [place];  Service: Cardiovascular;  Laterality: N/A;  . HEMORROIDECTOMY    . REDUCTION MAMMAPLASTY Bilateral   . RENAL ARTERY STENT  2008   Right renal artery by Dr. Drucie [name]  . TONSILLECTOMY    . TUBAL LIGATION  1970's     Medications: Current Meds  Medication Sig  . amLODipine (NORVASC) 10  MG tablet Take 1 tablet by mouth once daily  . apixaban (ELIQUIS) 2.5 MG TABS tablet Take 1 tablet (2.5 mg total) by mouth 2 (two) times daily.  Marland Kitchen atorvastatin (LIPITOR) 10 MG tablet Take 1 tablet (10 mg total) by mouth daily.  . Cholecalciferol (VITAMIN D3 PO) Take 1 tablet by mouth daily.  Marland Kitchen dofetilide (TIKOSYN) 125 MCG capsule TAKE 3 CAPSULES BY MOUTH TWICE DAILY  . fluticasone (FLONASE) 50 MCG/ACT nasal spray Place 1 spray into both nostrils daily as needed for allergies or rhinitis.  . furosemide (LASIX) 20 MG tablet Take 1 tablet by mouth once daily  . magnesium oxide (MAG-OX) 400 MG tablet Take 1 tablet (400 mg total) by mouth every morning.  . metFORMIN (GLUCOPHAGE-XR) 500 MG 24 hr tablet Take 500 mg by mouth at bedtime.  . metoprolol succinate (TOPROL XL) 25 MG 24 hr tablet Take 0.5 tablets (12.5 mg total) by mouth daily.  . mirtazapine (REMERON) 15 MG tablet Take 15 mg by mouth daily.  . mirtazapine (REMERON) 30 MG tablet Take 30 mg by mouth at bedtime.  . Multiple Vitamin (MULTIVITAMIN WITH MINERALS) TABS tablet Take 1 tablet by mouth daily.  Marland Kitchen NAMZARIC 28-10 MG CP24 Take 1 capsule by mouth at bedtime.  . nitroGLYCERIN (NITROSTAT) 0.4 MG SL tablet Place 1 tablet (0.4 mg total) under the tongue every 5 (five) minutes as needed for chest pain.  . potassium chloride SA (KLOR-CON M20) 20 MEQ tablet Take 1 tablet (20 mEq total) by mouth daily.  . vitamin E 400 UNIT capsule Take 400 Units by mouth daily.     Allergies: Allergies  Allergen Reactions  . Tagamet [Cimetidine] Hives  . Diovan [Valsartan] Other (See Comments)    Hair loss    Social History: The patient  reports that she has never smoked. She has never used smokeless tobacco. She reports current alcohol use. She reports that she does not use drugs.   Family History: The patient's family history includes Aneurysm in her father; Cancer (age of onset: 1) in her sister; Diabetes in her father and mother; Heart attack in  her father; Hypertension in her father and mother; Stroke (age of onset: 51) in her mother.   Review of Systems: Please see the history of present illness.   All other systems are reviewed and negative.   Physical Exam: VS:  BP 140/78   Pulse (!) 57   Ht 5\' 1"  (1.549 m)   Wt 124 lb (56.2 kg)   SpO2 99%   BMI 23.43 kg/m  .  BMI Body mass index is 23.43 kg/m.  Wt Readings from Last 3 Encounters:  02/13/20 124 lb (  56.2 kg)  02/09/20 128 lb (58.1 kg)  01/08/20 120 lb 13 oz (54.8 kg)    General: Alert and in no acute distress.   Cardiac: Regular rate and rhythm. No murmurs, rubs, or gallops. No edema.  Respiratory:  Lungs are clear to auscultation bilaterally with normal work of breathing.  GI: Soft and nontender.  MS: No deformity or atrophy. Gait and ROM intact.  Skin: Warm and dry. Color is normal.  Neuro:  Strength and sensation are intact and no gross focal deficits noted.  Psych: Alert, appropriate and with normal affect.   LABORATORY DATA:  EKG:  EKG is ordered today.  Personally reviewed by me. This demonstrates NSr with 1st degree AV block.  Lab Results  Component Value Date   WBC 5.7 01/08/2020   HGB 15.3 (H) 01/08/2020   HCT 45.0 01/08/2020   PLT 193 01/08/2020   GLUCOSE 146 (H) 01/08/2020   CHOL 138 01/09/2020   TRIG 92 01/09/2020   HDL 45 01/09/2020   LDLCALC 75 01/09/2020   ALT 24 01/08/2020   AST 28 01/08/2020   NA 143 01/08/2020   K 4.1 01/08/2020   CL 107 01/08/2020   CREATININE 1.10 (H) 01/08/2020   BUN 25 (H) 01/08/2020   CO2 22 01/08/2020   TSH 0.443 11/19/2014   INR 1.1 01/08/2020   HGBA1C 6.1 (H) 01/09/2020     BNP (last 3 results) No results for input(s): BNP in the last 8760 hours.  ProBNP (last 3 results) No results for input(s): PROBNP in the last 8760 hours.   Other Studies Reviewed Today:  ECHO IMPRESSIONS 01/2020  1. Left ventricular ejection fraction, by estimation, is 55 to 60%. The  left ventricle has normal function.  The left ventricle has no regional  wall motion abnormalities. Left ventricular diastolic parameters are  consistent with Grade II diastolic  dysfunction (pseudonormalization).  2. Right ventricular systolic function is normal. The right ventricular  size is normal. There is moderately elevated pulmonary artery systolic  pressure.  3. Right atrial size was moderately dilated.  4. The mitral valve is normal in structure. Trivial mitral valve  regurgitation. No evidence of mitral stenosis.  5. Tricuspid valve regurgitation is moderate.  6. The aortic valve is tricuspid. There is mild calcification of the  aortic valve. There is mild thickening of the aortic valve. Aortic valve  regurgitation is not visualized. No aortic stenosis is present.  7. The inferior vena cava is normal in size with greater than 50%  respiratory variability, suggesting right atrial pressure of 3 mmHg.    Carotid Doppler Summary 1/22:  Right Carotid: Velocities in the right ICA are consistent with a 1-39%  stenosis.   Left Carotid: Velocities in the left ICA are consistent with a 1-39%  stenosis.   Vertebrals: Right vertebral artery demonstrates antegrade flow. Left  vertebral artery was not visualized.  Subclavians: Normal flow hemodynamics were seen in bilateral subclavian       arteries.   *See table(s) above for measurements and observations.  Electronically signed by Ruta Hinds MD on 01/09/2020 at 12:16:41 PM.      Renal Duplex 06/2019:     Right: Evidence of a greater than 60% stenosis of the right renal         artery, essentially stable when compared to previous exam.         RRV flow present. Abnormal size for the right kidney. Normal         right Resisitive  Index. Normal cortical thickness of right         kidney.  Left:  Evidence of a > 60% stenosis in the left renal artery,         essentially stable when compared to the previous exam. LRV         flow present. Normal size of  left kidney. Normal left         Resistive Index. Normal cortical thickness of the left         kidney.  Mesenteric:  Normal Celiac artery and Superior Mesenteric artery findings. Areas of  limited  visceral study include right kidney size, right renal artery and left  renal  artery.     *See table(s) above for measurements and observations.     Suggest follow up study in 12 months.    Myoview Study Highlights 2016    There was no ST segment deviation noted during stress.  This is a low risk study.   Perfusion imaging shows no evidence of ischemia or scar. Study was not gated because of the atrial fibrillation. Consider echocardiogram to assess LV systolic function if needed.      Assessment/Plan:  1. Recent stroke - felt to be due to non compliance with Eliquis - explained this multiple times to him - he says he will agree to let the family have more oversight into her care.   2. PAF - on Tikosyn and Eliquis - compliance is an issue - she is in sinus rhythm.   3. Prior tachycardia/AIVR - most likely due to med non compliance.   4. Chronic 1st degree AV block.   5. HTN - BP ok - would follow for now.   6. Chronic anticoagulation - has had recent stroke due to non compliance with Eliquis - apparently due to husband not giving as prescribed.   7. Non compliance - this is the primary issue - complicated by his reported alcohol use. He says he is agreeable to letting their children help out, oversee her medicines, etc. Very tenuous situation.   Current medicines are reviewed with the patient today.  The patient does not have concerns regarding medicines other than what has been noted above.  The following changes have been made:  See above.  Labs/ tests ordered today include:    Orders Placed This Encounter  Procedures  . EKG 12-Lead     Disposition:   FU with Dr. Tamala [name] in about 3 to 4 months.     Patient is agreeable to this plan and will call if any problems  develop in the interim.   SignedTruitt Merle, NP  02/13/2020 3:42 PM  [facility] [address] [place], [place]  [address] Phone: [phone] Fax: [phone]

## 2020-02-01 DIAGNOSIS — Z7901 Long term (current) use of anticoagulants: Secondary | ICD-10-CM | POA: Diagnosis not present

## 2020-02-01 DIAGNOSIS — N183 Chronic kidney disease, stage 3 unspecified: Secondary | ICD-10-CM | POA: Diagnosis not present

## 2020-02-01 DIAGNOSIS — R32 Unspecified urinary incontinence: Secondary | ICD-10-CM | POA: Diagnosis not present

## 2020-02-01 DIAGNOSIS — I672 Cerebral atherosclerosis: Secondary | ICD-10-CM | POA: Diagnosis not present

## 2020-02-01 DIAGNOSIS — I081 Rheumatic disorders of both mitral and tricuspid valves: Secondary | ICD-10-CM | POA: Diagnosis not present

## 2020-02-01 DIAGNOSIS — N6019 Diffuse cystic mastopathy of unspecified breast: Secondary | ICD-10-CM | POA: Diagnosis not present

## 2020-02-01 DIAGNOSIS — F0280 Dementia in other diseases classified elsewhere without behavioral disturbance: Secondary | ICD-10-CM | POA: Diagnosis not present

## 2020-02-01 DIAGNOSIS — Z8673 Personal history of transient ischemic attack (TIA), and cerebral infarction without residual deficits: Secondary | ICD-10-CM | POA: Diagnosis not present

## 2020-02-01 DIAGNOSIS — I480 Paroxysmal atrial fibrillation: Secondary | ICD-10-CM | POA: Diagnosis not present

## 2020-02-01 DIAGNOSIS — I773 Arterial fibromuscular dysplasia: Secondary | ICD-10-CM | POA: Diagnosis not present

## 2020-02-01 DIAGNOSIS — I701 Atherosclerosis of renal artery: Secondary | ICD-10-CM | POA: Diagnosis not present

## 2020-02-01 DIAGNOSIS — I130 Hypertensive heart and chronic kidney disease with heart failure and stage 1 through stage 4 chronic kidney disease, or unspecified chronic kidney disease: Secondary | ICD-10-CM | POA: Diagnosis not present

## 2020-02-01 DIAGNOSIS — G309 Alzheimer's disease, unspecified: Secondary | ICD-10-CM | POA: Diagnosis not present

## 2020-02-01 DIAGNOSIS — I5032 Chronic diastolic (congestive) heart failure: Secondary | ICD-10-CM | POA: Diagnosis not present

## 2020-02-03 DIAGNOSIS — F0280 Dementia in other diseases classified elsewhere without behavioral disturbance: Secondary | ICD-10-CM | POA: Diagnosis not present

## 2020-02-03 DIAGNOSIS — I672 Cerebral atherosclerosis: Secondary | ICD-10-CM | POA: Diagnosis not present

## 2020-02-03 DIAGNOSIS — I5032 Chronic diastolic (congestive) heart failure: Secondary | ICD-10-CM | POA: Diagnosis not present

## 2020-02-03 DIAGNOSIS — G309 Alzheimer's disease, unspecified: Secondary | ICD-10-CM | POA: Diagnosis not present

## 2020-02-03 DIAGNOSIS — N183 Chronic kidney disease, stage 3 unspecified: Secondary | ICD-10-CM | POA: Diagnosis not present

## 2020-02-03 DIAGNOSIS — I130 Hypertensive heart and chronic kidney disease with heart failure and stage 1 through stage 4 chronic kidney disease, or unspecified chronic kidney disease: Secondary | ICD-10-CM | POA: Diagnosis not present

## 2020-02-06 DIAGNOSIS — I4891 Unspecified atrial fibrillation: Secondary | ICD-10-CM | POA: Diagnosis not present

## 2020-02-06 DIAGNOSIS — I5032 Chronic diastolic (congestive) heart failure: Secondary | ICD-10-CM | POA: Diagnosis not present

## 2020-02-06 DIAGNOSIS — E782 Mixed hyperlipidemia: Secondary | ICD-10-CM | POA: Diagnosis not present

## 2020-02-06 DIAGNOSIS — I672 Cerebral atherosclerosis: Secondary | ICD-10-CM | POA: Diagnosis not present

## 2020-02-06 DIAGNOSIS — G459 Transient cerebral ischemic attack, unspecified: Secondary | ICD-10-CM | POA: Diagnosis not present

## 2020-02-06 DIAGNOSIS — E119 Type 2 diabetes mellitus without complications: Secondary | ICD-10-CM | POA: Diagnosis not present

## 2020-02-06 DIAGNOSIS — I130 Hypertensive heart and chronic kidney disease with heart failure and stage 1 through stage 4 chronic kidney disease, or unspecified chronic kidney disease: Secondary | ICD-10-CM | POA: Diagnosis not present

## 2020-02-06 DIAGNOSIS — F0390 Unspecified dementia without behavioral disturbance: Secondary | ICD-10-CM | POA: Diagnosis not present

## 2020-02-06 DIAGNOSIS — I10 Essential (primary) hypertension: Secondary | ICD-10-CM | POA: Diagnosis not present

## 2020-02-06 DIAGNOSIS — N183 Chronic kidney disease, stage 3 unspecified: Secondary | ICD-10-CM | POA: Diagnosis not present

## 2020-02-06 DIAGNOSIS — I509 Heart failure, unspecified: Secondary | ICD-10-CM | POA: Diagnosis not present

## 2020-02-06 DIAGNOSIS — F0280 Dementia in other diseases classified elsewhere without behavioral disturbance: Secondary | ICD-10-CM | POA: Diagnosis not present

## 2020-02-06 DIAGNOSIS — F341 Dysthymic disorder: Secondary | ICD-10-CM | POA: Diagnosis not present

## 2020-02-06 DIAGNOSIS — I701 Atherosclerosis of renal artery: Secondary | ICD-10-CM | POA: Diagnosis not present

## 2020-02-06 DIAGNOSIS — G309 Alzheimer's disease, unspecified: Secondary | ICD-10-CM | POA: Diagnosis not present

## 2020-02-06 DIAGNOSIS — I480 Paroxysmal atrial fibrillation: Secondary | ICD-10-CM | POA: Diagnosis not present

## 2020-02-07 DIAGNOSIS — I130 Hypertensive heart and chronic kidney disease with heart failure and stage 1 through stage 4 chronic kidney disease, or unspecified chronic kidney disease: Secondary | ICD-10-CM | POA: Diagnosis not present

## 2020-02-07 DIAGNOSIS — I773 Arterial fibromuscular dysplasia: Secondary | ICD-10-CM | POA: Diagnosis not present

## 2020-02-07 DIAGNOSIS — I480 Paroxysmal atrial fibrillation: Secondary | ICD-10-CM | POA: Diagnosis not present

## 2020-02-07 DIAGNOSIS — I672 Cerebral atherosclerosis: Secondary | ICD-10-CM | POA: Diagnosis not present

## 2020-02-07 DIAGNOSIS — N183 Chronic kidney disease, stage 3 unspecified: Secondary | ICD-10-CM | POA: Diagnosis not present

## 2020-02-07 DIAGNOSIS — Z7901 Long term (current) use of anticoagulants: Secondary | ICD-10-CM | POA: Diagnosis not present

## 2020-02-07 DIAGNOSIS — I701 Atherosclerosis of renal artery: Secondary | ICD-10-CM | POA: Diagnosis not present

## 2020-02-07 DIAGNOSIS — G309 Alzheimer's disease, unspecified: Secondary | ICD-10-CM | POA: Diagnosis not present

## 2020-02-07 DIAGNOSIS — I5032 Chronic diastolic (congestive) heart failure: Secondary | ICD-10-CM | POA: Diagnosis not present

## 2020-02-07 DIAGNOSIS — Z8673 Personal history of transient ischemic attack (TIA), and cerebral infarction without residual deficits: Secondary | ICD-10-CM | POA: Diagnosis not present

## 2020-02-07 DIAGNOSIS — I081 Rheumatic disorders of both mitral and tricuspid valves: Secondary | ICD-10-CM | POA: Diagnosis not present

## 2020-02-07 DIAGNOSIS — N6019 Diffuse cystic mastopathy of unspecified breast: Secondary | ICD-10-CM | POA: Diagnosis not present

## 2020-02-07 DIAGNOSIS — R32 Unspecified urinary incontinence: Secondary | ICD-10-CM | POA: Diagnosis not present

## 2020-02-07 DIAGNOSIS — F0280 Dementia in other diseases classified elsewhere without behavioral disturbance: Secondary | ICD-10-CM | POA: Diagnosis not present

## 2020-02-09 ENCOUNTER — Encounter: Payer: Self-pay | Admitting: Adult Health

## 2020-02-09 ENCOUNTER — Other Ambulatory Visit: Payer: Self-pay

## 2020-02-09 ENCOUNTER — Ambulatory Visit (INDEPENDENT_AMBULATORY_CARE_PROVIDER_SITE_OTHER): Payer: Medicare Other | Admitting: Adult Health

## 2020-02-09 VITALS — BP 157/82 | HR 58 | Ht 61.0 in | Wt 128.0 lb

## 2020-02-09 DIAGNOSIS — I672 Cerebral atherosclerosis: Secondary | ICD-10-CM | POA: Diagnosis not present

## 2020-02-09 DIAGNOSIS — G309 Alzheimer's disease, unspecified: Secondary | ICD-10-CM | POA: Diagnosis not present

## 2020-02-09 DIAGNOSIS — G459 Transient cerebral ischemic attack, unspecified: Secondary | ICD-10-CM

## 2020-02-09 DIAGNOSIS — Z91148 Patient's other noncompliance with medication regimen for other reason: Secondary | ICD-10-CM

## 2020-02-09 DIAGNOSIS — N183 Chronic kidney disease, stage 3 unspecified: Secondary | ICD-10-CM | POA: Diagnosis not present

## 2020-02-09 DIAGNOSIS — Z9114 Patient's other noncompliance with medication regimen: Secondary | ICD-10-CM

## 2020-02-09 DIAGNOSIS — F0280 Dementia in other diseases classified elsewhere without behavioral disturbance: Secondary | ICD-10-CM | POA: Diagnosis not present

## 2020-02-09 DIAGNOSIS — I130 Hypertensive heart and chronic kidney disease with heart failure and stage 1 through stage 4 chronic kidney disease, or unspecified chronic kidney disease: Secondary | ICD-10-CM | POA: Diagnosis not present

## 2020-02-09 DIAGNOSIS — I5032 Chronic diastolic (congestive) heart failure: Secondary | ICD-10-CM | POA: Diagnosis not present

## 2020-02-09 NOTE — Patient Instructions (Addendum)
Continue Eliquis (apixaban) daily  and atorvastatin for secondary stroke prevention -Ensure you are taking all medications as prescribed and as missing additional doses of your Eliquis can greatly increase your risk of additional strokes  Continue to follow up with PCP regarding cholesterol, blood pressure and diabetes management  Maintain strict control of hypertension with blood pressure goal below 130/90, diabetes with hemoglobin A1c goal below 7.0% and cholesterol with LDL cholesterol (bad cholesterol) goal below 70 mg/dL.     You will follow up with Judson Roch, NP as scheduled in June but please call earlier with any questions or concerns       Thank you for coming to see [place] at [facility]. I hope we have been able to provide you high quality care today.  You may receive a patient satisfaction survey over the next few weeks. We would appreciate your feedback and comments so that we may continue to improve ourselves and the health of our patients.    Stroke Prevention Some medical conditions and lifestyle choices can lead to a higher risk for a stroke. You can help to prevent a stroke by eating healthy foods and exercising. It also helps to not smoke and to manage any health problems you may have. How can this condition affect me? A stroke is an emergency. It should be treated right away. A stroke can lead to brain damage or threaten your life. There is a better chance of surviving and getting better after a stroke if you get medical help right away. What can increase my risk? The following medical conditions may increase your risk of a stroke:  Diseases of the heart and blood vessels (cardiovascular disease).  High blood pressure (hypertension).  Diabetes.  High cholesterol.  Sickle cell disease.  Problems with blood clotting.  Being very overweight.  Sleeping problems (obstructivesleep apnea). Other risk factors include:  Being older than age 39.  A  history of blood clots, stroke, or mini-stroke (TIA).  Race, ethnic background, or a family history of stroke.  Smoking or using tobacco products.  Taking birth control pills, especially if you smoke.  Heavy alcohol and drug use.  Not being active. What actions can I take to prevent this? Manage your health conditions  High cholesterol. ? Eat a healthy diet. If this is not enough to manage your cholesterol, you may need to take medicines. ? Take medicines as told by your doctor.  High blood pressure. ? Try to keep your blood pressure below 130/80. ? If your blood pressure cannot be managed through a healthy diet and regular exercise, you may need to take medicines. ? Take medicines as told by your doctor. ? Ask your doctor if you should check your blood pressure at home. ? Have your blood pressure checked every year.  Diabetes. ? Eat a healthy diet and get regular exercise. If your blood sugar (glucose) cannot be managed through diet and exercise, you may need to take medicines. ? Take medicines as told by your doctor.  Talk to your doctor about getting checked for sleeping problems. Signs of a problem can include: ? Snoring a lot. ? Feeling very tired.  Make sure that you manage any other conditions you have. Nutrition  Follow instructions from your doctor about what to eat or drink. You may be told to: ? Eat and drink fewer calories each day. ? Limit how much salt (sodium) you use to 1,500 milligrams (mg) each day. ? Use only healthy fats for cooking, such  as olive oil, canola oil, and sunflower oil. ? Eat healthy foods. To do this:  Choose foods that are high in fiber. These include whole grains, and fresh fruits and vegetables.  Eat at least 5 servings of fruits and vegetables a day. Try to fill one-half of your plate with fruits and vegetables at each meal.  Choose low-fat (lean) proteins. These include low-fat cuts of meat, chicken without skin, fish, tofu, beans,  and nuts.  Eat low-fat dairy products. ? Avoid foods that:  Are high in salt.  Have saturated fat.  Have trans fat.  Have cholesterol.  Are processed or pre-made. ? Count how many carbohydrates you eat and drink each day.   Lifestyle  If you drink alcohol: ? Limit how much you have to:  0-1 drink a day for women who are not pregnant.  0-2 drinks a day for men. ? Know how much alcohol is in your drink. In the [place] one drink equals one 12 oz bottle of beer (375mL), one 5 oz glass of wine (162mL), or one 1 oz glass of hard liquor (56mL).  Do not smoke or use any products that have nicotine or tobacco. If you need help quitting, ask your doctor.  Avoid secondhand smoke.  Do not use drugs. Activity  Try to stay at a healthy weight.  Get at least 30 minutes of exercise on most days, such as: ? Fast walking. ? Biking. ? Swimming.   Medicines  Take over-the-counter and prescription medicines only as told by your doctor.  Avoid taking birth control pills. Talk to your doctor about the risks of taking birth control pills if: ? You are over 75 years old. ? You smoke. ? You get very bad headaches. ? You have had a blood clot. Where to find more information  American Stroke Association: www.strokeassociation.org Get help right away if:  You or a loved one has any signs of a stroke. "BE FAST" is an easy way to remember the warning signs: ? B - Balance. Dizziness, sudden trouble walking, or loss of balance. ? E - Eyes. Trouble seeing or a change in how you see. ? F - Face. Sudden weakness or loss of feeling of the face. The face or eyelid may droop on one side. ? A - Arms. Weakness or loss of feeling in an arm. This happens all of a sudden and most often on one side of the body. ? S - Speech. Sudden trouble speaking, slurred speech, or trouble understanding what people say. ? T - Time. Time to call emergency services. Write down what time symptoms started.  You or a  loved one has other signs of a stroke, such as: ? A sudden, very bad headache with no known cause. ? Feeling like you may vomit (nausea). ? Vomiting. ? A seizure. These symptoms may be an emergency. Get help right away. Call your local emergency services (911 in the [place]  Do not wait to see if the symptoms will go away.  Do not drive yourself to the hospital. Summary  You can help to prevent a stroke by eating healthy, exercising, and not smoking. It also helps to manage any health problems you have.  Do not smoke or use any products that contain nicotine or tobacco.  Get help right away if you or a loved one has any signs of a stroke. This information is not intended to replace advice given to you by your health care provider. Make sure you discuss  any questions you have with your health care provider. Document Revised: 07/25/2019 Document Reviewed: 07/25/2019 Elsevier Patient Education  [place].

## 2020-02-09 NOTE — Progress Notes (Signed)
[facility] [address] [place]. [place] [address] [phone]       [place] Donna [name] Date of Birth:  18-Jun-1935 Medical Record Number:  [id number]   Reason for Referral:  hospital stroke follow up    SUBJECTIVE:   CHIEF COMPLAINT:  Chief Complaint  Patient presents with  . Follow-up    RM 14 husband (Donna [name]) Pt states she is great, husband says she has been up and down     HPI:   [name] is a 85 y.o. female w/pmh of atrial fibrillation on Eliquis, dementia, HTN, CAD, Diastolic HF, CKD who presented to [facility] ED on 01/08/2020 with an episode of garbled speech that completely resolved PTA.  Personally reviewed hospitalization pertinent progress notes, lab work and imaging with summary provided.  Evaluated by Dr. Leonie Man with stroke work-up largely unremarkable and no evidence of acute stroke.  Questionable medication compliance given her dementia and husband who has alcoholism and forgets to provide medications.  Questionable left hemispheric TIA cardioembolic etiology from A. fib with questionable Eliquis compliance.  Recommended continuation of Eliquis 2.5 mg twice daily for atrial fibrillation and secondary stroke prevention.  History of HTN stable during admission and resumed home BP meds.  History of HLD on atorvastatin 10 mg daily with LDL 75.  History of DM with A1c 6.1 on Metformin.  Other stroke risk factors include advanced age, CAD, CHF but no prior stroke history.  Recommended social work consult to further evaluate home situation including further assistance with medications.  Evaluated by therapies and discharged home in stable condition without therapy needs.  Stroke: Probable left hemispheric TIA-cardioembolic etiology from A. fib unsure about patient's compliance with anticoagulation usage  CT Head: No acute abnormality. Ventricular enlargement and atrophy which is most prominent in the frontal and temporal lobes.    CTA Head W WO Contrast w/no intracranial LVO + intracranial atherosclerosis ( please view full report above)   MRI: NAICP + Chronic microvascular ischemia and generalized volume  loss.  Carotid Doppler: Right ICA with 1-39 % stenosis, velocities in the left ICA with a 1-39 stenosis, right vertebral artery with antegrade flow, left vertebral artery not visualized.   2D Echo: EF 55 to 60 %, Right atrium moderately dilated, left ventricle with normal function ( please view full report above)    LDL 75  HgbA1c 6.1  VTE prophylaxis: Eliquis 2.5 mg BID ( resumed for atrial fibrillation)   Eliquis (apixaban) daily prior to admission, now on Eliquis (apixaban) daily.   Therapy recommendations: Supervision/assistance 24 hours ( could benefit from [facility] nursing to assist with medication management)   Disposition: [facility] with [facility]    Today, 02/09/2020, Donna [name] is being seen for hospital follow-up accompanied by her husband. She reports she has done well since discharge without new or reoccurring stroke/TIA symptoms. Per husband, she has missed additional evening doses of Eliquis and she will fall asleep and he has difficulty awakening her to take evening medications. Per husband, daughter sets up pillboxes and husband is responsible for administering. She denies any bleeding or bruising on Eliquis. Continues on atorvastatin without myalgias. Blood pressure today 157/82.  She is an established patient of Dr. Jannifer Franklin Alzheimer's dementia and has remained on Namzaric and Remeron with scheduled follow-up visit with Donna Roch, NP in June      ROS:   14 system review of systems performed and negative with exception of no complaints  PMH:  Past Medical  History:  Diagnosis Date  . Alzheimer disease ([place]) 06/27/2019  . Atherosclerosis of renal artery (HCC)    a. s/p R RA stenting;  b. 04/2014 Renal Art duplex: Patent R RA stent, <60% L RA stenosis. Followed by [facility].  . Carotid arterial disease ([place])    a.  01/2014 Carotid U/S: RICA 64%, LICA 40%. Followed by [facility].  . CKD (chronic kidney disease), stage III (HCC)    Stage II/III  . Essential hypertension   . Fibrocystic breast   . Fibromuscular dysplasia ([place])   . Hyperglycemia   . Mitral regurgitation    a. Echo 08/2014: mild MR.  . Paroxysmal atrial fibrillation ([place])    a. Dx 07/26/2014, CHA2DS2VASc = 5-->Eliquis;  c. 08/2014 Echo: EF 55-60%, no rwma, mildly dil LA/RA, mod-sev TR, PASP 80mmHg. b. s/p DCCV 08/2014. c. back in atrial flutter 09/2014 -> rate control pursued.  . Paroxysmal atrial flutter ([place])    a. Dx 07/26/2014, CHA2DS2VASc = 5-->Eliquis;  c. 08/2014 Echo: EF 55-60%, no rwma, mildly dil LA/RA, mod-sev TR, PASP 53mmHg. b. s/p DCCV 08/2014. c. back in atrial flutter 09/2014 -> rate control pursued.  . Tricuspid regurgitation    a. Echo 08/2014: mod-severe.    PSH:  Past Surgical History:  Procedure Laterality Date  . CARDIOVERSION N/A 09/01/2014   Procedure: CARDIOVERSION;  Surgeon: Josue Hector, MD;  Location: [facility] ENDOSCOPY;  Service: Cardiovascular;  Laterality: N/A;  . CARDIOVERSION N/A 11/23/2014   Procedure: CARDIOVERSION;  Surgeon: Skeet Latch, MD;  Location: [place];  Service: Cardiovascular;  Laterality: N/A;  . HEMORROIDECTOMY    . REDUCTION MAMMAPLASTY Bilateral   . RENAL ARTERY STENT  2008   Right renal artery by Dr. Drucie Opitz  . TONSILLECTOMY    . TUBAL LIGATION  1970's    Social History:  Social History   Socioeconomic History  . Marital status: Married    Spouse name: Donna [name]  . Number of children: 2  . Years of education: college  . Highest education level: Bachelor's degree (e.g., BA, AB, BS)  Occupational History  . Occupation: Retired  Tobacco Use  . Smoking status: Never Smoker  . Smokeless tobacco: Never Used  Vaping Use  . Vaping Use: Never used  Substance and Sexual Activity  . Alcohol use: Yes  . Drug use: No  . Sexual activity: Yes    Birth control/protection: Surgical     Comment: BTL  Other Topics Concern  . Not on file  Social History Narrative  . Not on file   Social Determinants of Health   Financial Resource Strain: Low Risk   . Difficulty of Paying Living Expenses: Not hard at all  Food Insecurity: No Food Insecurity  . Worried About Charity fundraiser in the Last Year: Never true  . Ran Out of Food in the Last Year: Never true  Transportation Needs: No Transportation Needs  . Lack of Transportation (Medical): No  . Lack of Transportation (Non-Medical): No  Physical Activity: Inactive  . Days of Exercise per Week: 0 days  . Minutes of Exercise per Session: 0 min  Stress: No Stress Concern Present  . Feeling of Stress : Only a little  Social Connections: Socially Integrated  . Frequency of Communication with Friends and Family: More than three times a week  . Frequency of Social Gatherings with Friends and Family: More than three times a week  . Attends Religious Services: More than 4 times per year  . Active Member  of Clubs or Organizations: Yes  . Attends Archivist Meetings: 1 to 4 times per year  . Marital Status: Married  Human resources officer Violence: Not At Risk  . Fear of Current or Ex-Partner: No  . Emotionally Abused: No  . Physically Abused: No  . Sexually Abused: No    Family History:  Family History  Problem Relation Age of Onset  . Stroke Mother 63  . Diabetes Mother   . Hypertension Mother   . Aneurysm Father        abdominal aortic aneurysm  . Diabetes Father   . Hypertension Father   . Heart attack Father   . Cancer Sister 72       breast cancer  . Breast cancer Neg Hx     Medications:   Current Outpatient Medications on File Prior to Visit  Medication Sig Dispense Refill  . amLODipine (NORVASC) 10 MG tablet Take 1 tablet (10 mg total) by mouth daily. (Patient taking differently: Take 10 mg by mouth at bedtime.) 90 tablet 3  . apixaban (ELIQUIS) 2.5 MG TABS tablet Take 1 tablet (2.5 mg total) by mouth  2 (two) times daily. 60 tablet 6  . atorvastatin (LIPITOR) 10 MG tablet Take 1 tablet (10 mg total) by mouth daily. (Patient taking differently: Take 10 mg by mouth at bedtime.) 90 tablet 3  . Cholecalciferol (VITAMIN D3 PO) Take 1 tablet by mouth daily.    Marland Kitchen dofetilide (TIKOSYN) 125 MCG capsule TAKE 3 CAPSULES BY MOUTH TWICE DAILY (Patient taking differently: Take 375 mcg by mouth 2 (two) times daily.) 540 capsule 0  . fluticasone (FLONASE) 50 MCG/ACT nasal spray Place 1 spray into both nostrils daily as needed for allergies or rhinitis.    . furosemide (LASIX) 20 MG tablet Take 1 tablet by mouth once daily (Patient taking differently: Take 20 mg by mouth daily.) 90 tablet 3  . magnesium oxide (MAG-OX) 400 MG tablet Take 1 tablet (400 mg total) by mouth every morning. 90 tablet 1  . metFORMIN (GLUCOPHAGE-XR) 500 MG 24 hr tablet Take 500 mg by mouth at bedtime.  6  . metoprolol succinate (TOPROL XL) 25 MG 24 hr tablet Take 0.5 tablets (12.5 mg total) by mouth daily. 90 tablet 3  . mirtazapine (REMERON) 15 MG tablet Take 15 mg by mouth daily.    . mirtazapine (REMERON) 30 MG tablet Take 30 mg by mouth at bedtime.    . Multiple Vitamin (MULTIVITAMIN WITH MINERALS) TABS tablet Take 1 tablet by mouth daily.    Marland Kitchen NAMZARIC 28-10 MG CP24 Take 1 capsule by mouth at bedtime.    . nitroGLYCERIN (NITROSTAT) 0.4 MG SL tablet Place 1 tablet (0.4 mg total) under the tongue every 5 (five) minutes as needed for chest pain. 25 tablet 3  . potassium chloride SA (KLOR-CON M20) 20 MEQ tablet Take 1 tablet (20 mEq total) by mouth daily. 90 tablet 3  . vitamin E 400 UNIT capsule Take 400 Units by mouth daily.     No current facility-administered medications on file prior to visit.    Allergies:   Allergies  Allergen Reactions  . Tagamet [Cimetidine] Hives  . Diovan [Valsartan] Other (See Comments)    Hair loss      OBJECTIVE:  Physical Exam  Vitals:   02/09/20 1358  BP: (!) 157/82  Pulse: (!) 58   Weight: 128 lb (58.1 kg)  Height: 5\' 1"  (1.549 m)   Body mass index is 24.19 kg/m. No exam  data present  General: Frail pleasantly confused elderly African-American female, seated, in no evident distress Head: head normocephalic and atraumatic.   Neck: supple with no carotid or supraclavicular bruits Cardiovascular: regular rate and rhythm, no murmurs Musculoskeletal: no deformity Skin:  no rash/petichiae Vascular:  Normal pulses all extremities   Neurologic Exam Mental Status: Awake and fully alert.  Fluent speech and language. Able to state birthday but disoriented to place and current time. Recent and remote memory impaired. Attention span and concentration appropriate and fund of knowledge impaired. Mood and affect appropriate and cooperative with exam.  Cranial Nerves: Fundoscopic exam reveals sharp disc margins. Pupils equal, briskly reactive to light. Extraocular movements full without nystagmus. Visual fields full to confrontation. Hearing intact. Facial sensation intact. Face, tongue, palate moves normally and symmetrically.  Motor: Normal bulk and tone. Normal strength in all tested extremity muscles Sensory.: intact to touch , pinprick , position and vibratory sensation.  Coordination: Rapid alternating movements normal in all extremities. Finger-to-nose and heel-to-shin performed accurately bilaterally. Gait and Station: Arises from chair without difficulty. Stance is normal. Gait demonstrates normal stride length and balance without use of assistive device Reflexes: 1+ and symmetric. Toes downgoing.     NIHSS  0 Modified Rankin  0      ASSESSMENT: Donna [name] is a 85 y.o. year old female presented with transient episode of garbled speech on 01/08/2020 likely in setting of TIA, cardioembolic etiology with known atrial fibrillation and questionable Eliquis compliance. Vascular risk factors include Alzheimer's dementia, HTN, HLD, DM, advanced age, CAD and CHF.       PLAN:  1. TIA: Denies new or recurrent stroke/TIA symptoms. Continue Eliquis (apixaban) daily  and atorvastatin for secondary stroke prevention.  Discussed secondary stroke prevention measures and importance of close PCP follow up for aggressive stroke risk factor management  2. Atrial fibrillation: On Eliquis 2.5 mg twice daily per cardiology. Continued intermittent compliance especially with evening doses -discussed with patient and husband importance of Eliquis compliance due to increased stroke risk -husband verbalized understanding. Routinely followed by cardiology will scheduled follow-up visit next week 3. HTN: BP goal <130/90. Elevated today but stable at home per husband on amlodipine, furosemide and metoprolol per PCP 4. HLD: LDL goal <70. Recent LDL 75 on atorvastatin 10 mg daily per PCP.  5. DMII: A1c goal<7.0. Recent A1c 6.1 on Metformin per PCP.  6. Alzheimer's dementia: Followed by Dr. Jannifer Franklin on [place] and Remeron. Schedule follow-up visit with Donna Roch, NP in June   Follow-up with Donna Roch, NP as scheduled in June but advised husband to call office earlier if needed   CC:  [place] provider: Dr. Freada Bergeron, Herbie Baltimore, MD    I spent 45 minutes of face-to-face and non-face-to-face time with patient and husband.  This included previsit chart review including recent hospitalization results, lab work and imaging, lab review, study review, order entry, electronic health record documentation, patient education regarding recent TIA, importance of medication compliance, importance of managing stroke risk factors and answered all other questions to patient satisfaction   Frann Rider, AGNP-BC  [facility] [address] [place], [place] [address]  Phone [phone] Fax [phone] Note: This document was prepared with digital dictation and possible smart phrase technology. Any transcriptional errors that result from this process are  unintentional.

## 2020-02-09 NOTE — Progress Notes (Signed)
I agree with the above plan 

## 2020-02-10 ENCOUNTER — Other Ambulatory Visit: Payer: Self-pay | Admitting: Nurse Practitioner

## 2020-02-10 DIAGNOSIS — I672 Cerebral atherosclerosis: Secondary | ICD-10-CM | POA: Diagnosis not present

## 2020-02-10 DIAGNOSIS — F0280 Dementia in other diseases classified elsewhere without behavioral disturbance: Secondary | ICD-10-CM | POA: Diagnosis not present

## 2020-02-10 DIAGNOSIS — N183 Chronic kidney disease, stage 3 unspecified: Secondary | ICD-10-CM | POA: Diagnosis not present

## 2020-02-10 DIAGNOSIS — I130 Hypertensive heart and chronic kidney disease with heart failure and stage 1 through stage 4 chronic kidney disease, or unspecified chronic kidney disease: Secondary | ICD-10-CM | POA: Diagnosis not present

## 2020-02-10 DIAGNOSIS — G309 Alzheimer's disease, unspecified: Secondary | ICD-10-CM | POA: Diagnosis not present

## 2020-02-10 DIAGNOSIS — I5032 Chronic diastolic (congestive) heart failure: Secondary | ICD-10-CM | POA: Diagnosis not present

## 2020-02-13 ENCOUNTER — Encounter: Payer: Self-pay | Admitting: Nurse Practitioner

## 2020-02-13 ENCOUNTER — Ambulatory Visit (INDEPENDENT_AMBULATORY_CARE_PROVIDER_SITE_OTHER): Payer: Medicare Other | Admitting: Nurse Practitioner

## 2020-02-13 ENCOUNTER — Other Ambulatory Visit: Payer: Self-pay

## 2020-02-13 VITALS — BP 140/78 | HR 57 | Ht 61.0 in | Wt 124.0 lb

## 2020-02-13 DIAGNOSIS — F0280 Dementia in other diseases classified elsewhere without behavioral disturbance: Secondary | ICD-10-CM | POA: Diagnosis not present

## 2020-02-13 DIAGNOSIS — G309 Alzheimer's disease, unspecified: Secondary | ICD-10-CM | POA: Diagnosis not present

## 2020-02-13 DIAGNOSIS — I130 Hypertensive heart and chronic kidney disease with heart failure and stage 1 through stage 4 chronic kidney disease, or unspecified chronic kidney disease: Secondary | ICD-10-CM | POA: Diagnosis not present

## 2020-02-13 DIAGNOSIS — N183 Chronic kidney disease, stage 3 unspecified: Secondary | ICD-10-CM | POA: Diagnosis not present

## 2020-02-13 DIAGNOSIS — Z79899 Other long term (current) drug therapy: Secondary | ICD-10-CM

## 2020-02-13 DIAGNOSIS — I480 Paroxysmal atrial fibrillation: Secondary | ICD-10-CM | POA: Diagnosis not present

## 2020-02-13 DIAGNOSIS — I5032 Chronic diastolic (congestive) heart failure: Secondary | ICD-10-CM | POA: Diagnosis not present

## 2020-02-13 DIAGNOSIS — I672 Cerebral atherosclerosis: Secondary | ICD-10-CM | POA: Diagnosis not present

## 2020-02-13 NOTE — Patient Instructions (Addendum)
After Visit Summary:  We will be checking the following labs today - NONE   Medication Instructions:    Continue with your current medicines.    If you need a refill on your cardiac medications before your next appointment, please call your pharmacy.     Testing/Procedures To Be Arranged:  N/A  Follow-Up:   See Dr. Tamala Julian in about 3 to 4 months - let your daughter come.     At [facility], you and your health needs are our priority.  As part of our continuing mission to provide you with exceptional heart care, we have created designated Provider Care Teams.  These Care Teams include your primary Cardiologist (physician) and Advanced Practice Providers (APPs -  Physician Assistants and Nurse Practitioners) who all work together to provide you with the care you need, when you need it.  Special Instructions:  . Stay safe, wash your hands for at least 20 seconds and wear a mask when needed.  . Let your kids start helping out. . It is important to take the medicines correctly.       Call the [place] office at [phone] if you have any questions, problems or concerns.

## 2020-02-13 NOTE — Telephone Encounter (Signed)
I have called the daughter and given her an updated and told her how I explained to her dad that it was time to let their children start helping/overseeing in order for [name] to do well going forward.   Burtis Junes, RN, [place] [address] [place], [place]  [address] [phone]

## 2020-02-14 ENCOUNTER — Telehealth: Payer: Self-pay

## 2020-02-14 DIAGNOSIS — I130 Hypertensive heart and chronic kidney disease with heart failure and stage 1 through stage 4 chronic kidney disease, or unspecified chronic kidney disease: Secondary | ICD-10-CM | POA: Diagnosis not present

## 2020-02-14 DIAGNOSIS — F0280 Dementia in other diseases classified elsewhere without behavioral disturbance: Secondary | ICD-10-CM | POA: Diagnosis not present

## 2020-02-14 DIAGNOSIS — I672 Cerebral atherosclerosis: Secondary | ICD-10-CM | POA: Diagnosis not present

## 2020-02-14 DIAGNOSIS — I5032 Chronic diastolic (congestive) heart failure: Secondary | ICD-10-CM | POA: Diagnosis not present

## 2020-02-14 DIAGNOSIS — N183 Chronic kidney disease, stage 3 unspecified: Secondary | ICD-10-CM | POA: Diagnosis not present

## 2020-02-14 DIAGNOSIS — G309 Alzheimer's disease, unspecified: Secondary | ICD-10-CM | POA: Diagnosis not present

## 2020-02-14 NOTE — Telephone Encounter (Addendum)
**Note De-identified  Obfuscation**   -----  **Note De-Identified Lilliana Turner Obfuscation** Message ----- From: Dennie Fetters, LPN Sent: 03/07/2506   8:07 AM EST To: Dellie Catholic Danielle, Yes, pts have to apply for pt asst yearly. I will call her to discuss. Thanks, Jeani Hawking:)  ----- Message ----- From: Tamsen Snider Sent: 02/13/2020   3:43 PM EST To: Deliah Boston Cloe Sockwell, LPN  [place] Hough,  This pt was curious about Eliquis 2.5 bid if this automatically gets renewed with assistance or do they need to fill out another form.  Please call pt and advise.  Thanks [employer]

## 2020-02-14 NOTE — Telephone Encounter (Signed)
**Note De-Identified  Obfuscation** I called the pts daughter (DPR) Marlys and discussed pt asst through [facility] for the pts Eliquis. Marlys stated that our office is giving them samples and that they do not need help at this time.  I explained to her that we cannot provide samples going forward unless she applies for pt asst through [employer] and during the time that the pts application is in processing.  I did advise that they apply ASAP. She verbalized understanding and thanked me for calling her.

## 2020-02-15 DIAGNOSIS — I5032 Chronic diastolic (congestive) heart failure: Secondary | ICD-10-CM | POA: Diagnosis not present

## 2020-02-15 DIAGNOSIS — F0280 Dementia in other diseases classified elsewhere without behavioral disturbance: Secondary | ICD-10-CM | POA: Diagnosis not present

## 2020-02-15 DIAGNOSIS — N183 Chronic kidney disease, stage 3 unspecified: Secondary | ICD-10-CM | POA: Diagnosis not present

## 2020-02-15 DIAGNOSIS — I672 Cerebral atherosclerosis: Secondary | ICD-10-CM | POA: Diagnosis not present

## 2020-02-15 DIAGNOSIS — I130 Hypertensive heart and chronic kidney disease with heart failure and stage 1 through stage 4 chronic kidney disease, or unspecified chronic kidney disease: Secondary | ICD-10-CM | POA: Diagnosis not present

## 2020-02-15 DIAGNOSIS — G309 Alzheimer's disease, unspecified: Secondary | ICD-10-CM | POA: Diagnosis not present

## 2020-02-16 DIAGNOSIS — I5032 Chronic diastolic (congestive) heart failure: Secondary | ICD-10-CM | POA: Diagnosis not present

## 2020-02-16 DIAGNOSIS — I130 Hypertensive heart and chronic kidney disease with heart failure and stage 1 through stage 4 chronic kidney disease, or unspecified chronic kidney disease: Secondary | ICD-10-CM | POA: Diagnosis not present

## 2020-02-16 DIAGNOSIS — F0280 Dementia in other diseases classified elsewhere without behavioral disturbance: Secondary | ICD-10-CM | POA: Diagnosis not present

## 2020-02-16 DIAGNOSIS — N183 Chronic kidney disease, stage 3 unspecified: Secondary | ICD-10-CM | POA: Diagnosis not present

## 2020-02-16 DIAGNOSIS — G309 Alzheimer's disease, unspecified: Secondary | ICD-10-CM | POA: Diagnosis not present

## 2020-02-16 DIAGNOSIS — I672 Cerebral atherosclerosis: Secondary | ICD-10-CM | POA: Diagnosis not present

## 2020-02-17 DIAGNOSIS — F0280 Dementia in other diseases classified elsewhere without behavioral disturbance: Secondary | ICD-10-CM | POA: Diagnosis not present

## 2020-02-17 DIAGNOSIS — N183 Chronic kidney disease, stage 3 unspecified: Secondary | ICD-10-CM | POA: Diagnosis not present

## 2020-02-17 DIAGNOSIS — I672 Cerebral atherosclerosis: Secondary | ICD-10-CM | POA: Diagnosis not present

## 2020-02-17 DIAGNOSIS — I5032 Chronic diastolic (congestive) heart failure: Secondary | ICD-10-CM | POA: Diagnosis not present

## 2020-02-17 DIAGNOSIS — I130 Hypertensive heart and chronic kidney disease with heart failure and stage 1 through stage 4 chronic kidney disease, or unspecified chronic kidney disease: Secondary | ICD-10-CM | POA: Diagnosis not present

## 2020-02-17 DIAGNOSIS — G309 Alzheimer's disease, unspecified: Secondary | ICD-10-CM | POA: Diagnosis not present

## 2020-02-20 DIAGNOSIS — I5032 Chronic diastolic (congestive) heart failure: Secondary | ICD-10-CM | POA: Diagnosis not present

## 2020-02-20 DIAGNOSIS — F0280 Dementia in other diseases classified elsewhere without behavioral disturbance: Secondary | ICD-10-CM | POA: Diagnosis not present

## 2020-02-20 DIAGNOSIS — I672 Cerebral atherosclerosis: Secondary | ICD-10-CM | POA: Diagnosis not present

## 2020-02-20 DIAGNOSIS — G309 Alzheimer's disease, unspecified: Secondary | ICD-10-CM | POA: Diagnosis not present

## 2020-02-20 DIAGNOSIS — I130 Hypertensive heart and chronic kidney disease with heart failure and stage 1 through stage 4 chronic kidney disease, or unspecified chronic kidney disease: Secondary | ICD-10-CM | POA: Diagnosis not present

## 2020-02-20 DIAGNOSIS — N183 Chronic kidney disease, stage 3 unspecified: Secondary | ICD-10-CM | POA: Diagnosis not present

## 2020-02-21 DIAGNOSIS — F0280 Dementia in other diseases classified elsewhere without behavioral disturbance: Secondary | ICD-10-CM | POA: Diagnosis not present

## 2020-02-21 DIAGNOSIS — I130 Hypertensive heart and chronic kidney disease with heart failure and stage 1 through stage 4 chronic kidney disease, or unspecified chronic kidney disease: Secondary | ICD-10-CM | POA: Diagnosis not present

## 2020-02-21 DIAGNOSIS — G309 Alzheimer's disease, unspecified: Secondary | ICD-10-CM | POA: Diagnosis not present

## 2020-02-21 DIAGNOSIS — I672 Cerebral atherosclerosis: Secondary | ICD-10-CM | POA: Diagnosis not present

## 2020-02-21 DIAGNOSIS — N183 Chronic kidney disease, stage 3 unspecified: Secondary | ICD-10-CM | POA: Diagnosis not present

## 2020-02-21 DIAGNOSIS — I5032 Chronic diastolic (congestive) heart failure: Secondary | ICD-10-CM | POA: Diagnosis not present

## 2020-02-22 DIAGNOSIS — I130 Hypertensive heart and chronic kidney disease with heart failure and stage 1 through stage 4 chronic kidney disease, or unspecified chronic kidney disease: Secondary | ICD-10-CM | POA: Diagnosis not present

## 2020-02-22 DIAGNOSIS — I5032 Chronic diastolic (congestive) heart failure: Secondary | ICD-10-CM | POA: Diagnosis not present

## 2020-02-22 DIAGNOSIS — N183 Chronic kidney disease, stage 3 unspecified: Secondary | ICD-10-CM | POA: Diagnosis not present

## 2020-02-22 DIAGNOSIS — F0280 Dementia in other diseases classified elsewhere without behavioral disturbance: Secondary | ICD-10-CM | POA: Diagnosis not present

## 2020-02-22 DIAGNOSIS — G309 Alzheimer's disease, unspecified: Secondary | ICD-10-CM | POA: Diagnosis not present

## 2020-02-22 DIAGNOSIS — I672 Cerebral atherosclerosis: Secondary | ICD-10-CM | POA: Diagnosis not present

## 2020-02-23 DIAGNOSIS — I130 Hypertensive heart and chronic kidney disease with heart failure and stage 1 through stage 4 chronic kidney disease, or unspecified chronic kidney disease: Secondary | ICD-10-CM | POA: Diagnosis not present

## 2020-02-23 DIAGNOSIS — N183 Chronic kidney disease, stage 3 unspecified: Secondary | ICD-10-CM | POA: Diagnosis not present

## 2020-02-23 DIAGNOSIS — I672 Cerebral atherosclerosis: Secondary | ICD-10-CM | POA: Diagnosis not present

## 2020-02-23 DIAGNOSIS — F0280 Dementia in other diseases classified elsewhere without behavioral disturbance: Secondary | ICD-10-CM | POA: Diagnosis not present

## 2020-02-23 DIAGNOSIS — I5032 Chronic diastolic (congestive) heart failure: Secondary | ICD-10-CM | POA: Diagnosis not present

## 2020-02-23 DIAGNOSIS — G309 Alzheimer's disease, unspecified: Secondary | ICD-10-CM | POA: Diagnosis not present

## 2020-02-24 DIAGNOSIS — I672 Cerebral atherosclerosis: Secondary | ICD-10-CM | POA: Diagnosis not present

## 2020-02-24 DIAGNOSIS — G309 Alzheimer's disease, unspecified: Secondary | ICD-10-CM | POA: Diagnosis not present

## 2020-02-24 DIAGNOSIS — F0280 Dementia in other diseases classified elsewhere without behavioral disturbance: Secondary | ICD-10-CM | POA: Diagnosis not present

## 2020-02-24 DIAGNOSIS — N183 Chronic kidney disease, stage 3 unspecified: Secondary | ICD-10-CM | POA: Diagnosis not present

## 2020-02-24 DIAGNOSIS — I5032 Chronic diastolic (congestive) heart failure: Secondary | ICD-10-CM | POA: Diagnosis not present

## 2020-02-24 DIAGNOSIS — I130 Hypertensive heart and chronic kidney disease with heart failure and stage 1 through stage 4 chronic kidney disease, or unspecified chronic kidney disease: Secondary | ICD-10-CM | POA: Diagnosis not present

## 2020-02-27 DIAGNOSIS — G459 Transient cerebral ischemic attack, unspecified: Secondary | ICD-10-CM | POA: Diagnosis not present

## 2020-02-27 DIAGNOSIS — F0280 Dementia in other diseases classified elsewhere without behavioral disturbance: Secondary | ICD-10-CM | POA: Diagnosis not present

## 2020-02-27 DIAGNOSIS — I10 Essential (primary) hypertension: Secondary | ICD-10-CM | POA: Diagnosis not present

## 2020-02-27 DIAGNOSIS — G309 Alzheimer's disease, unspecified: Secondary | ICD-10-CM | POA: Diagnosis not present

## 2020-02-27 DIAGNOSIS — I5032 Chronic diastolic (congestive) heart failure: Secondary | ICD-10-CM | POA: Diagnosis not present

## 2020-02-27 DIAGNOSIS — D6869 Other thrombophilia: Secondary | ICD-10-CM | POA: Diagnosis not present

## 2020-02-27 DIAGNOSIS — I130 Hypertensive heart and chronic kidney disease with heart failure and stage 1 through stage 4 chronic kidney disease, or unspecified chronic kidney disease: Secondary | ICD-10-CM | POA: Diagnosis not present

## 2020-02-27 DIAGNOSIS — I672 Cerebral atherosclerosis: Secondary | ICD-10-CM | POA: Diagnosis not present

## 2020-02-27 DIAGNOSIS — I4891 Unspecified atrial fibrillation: Secondary | ICD-10-CM | POA: Diagnosis not present

## 2020-02-27 DIAGNOSIS — N183 Chronic kidney disease, stage 3 unspecified: Secondary | ICD-10-CM | POA: Diagnosis not present

## 2020-02-27 DIAGNOSIS — F0390 Unspecified dementia without behavioral disturbance: Secondary | ICD-10-CM | POA: Diagnosis not present

## 2020-02-27 DIAGNOSIS — I509 Heart failure, unspecified: Secondary | ICD-10-CM | POA: Diagnosis not present

## 2020-02-28 DIAGNOSIS — G309 Alzheimer's disease, unspecified: Secondary | ICD-10-CM | POA: Diagnosis not present

## 2020-02-28 DIAGNOSIS — F0280 Dementia in other diseases classified elsewhere without behavioral disturbance: Secondary | ICD-10-CM | POA: Diagnosis not present

## 2020-02-28 DIAGNOSIS — N183 Chronic kidney disease, stage 3 unspecified: Secondary | ICD-10-CM | POA: Diagnosis not present

## 2020-02-28 DIAGNOSIS — I672 Cerebral atherosclerosis: Secondary | ICD-10-CM | POA: Diagnosis not present

## 2020-02-28 DIAGNOSIS — I130 Hypertensive heart and chronic kidney disease with heart failure and stage 1 through stage 4 chronic kidney disease, or unspecified chronic kidney disease: Secondary | ICD-10-CM | POA: Diagnosis not present

## 2020-02-28 DIAGNOSIS — I5032 Chronic diastolic (congestive) heart failure: Secondary | ICD-10-CM | POA: Diagnosis not present

## 2020-03-01 DIAGNOSIS — G309 Alzheimer's disease, unspecified: Secondary | ICD-10-CM | POA: Diagnosis not present

## 2020-03-01 DIAGNOSIS — F0280 Dementia in other diseases classified elsewhere without behavioral disturbance: Secondary | ICD-10-CM | POA: Diagnosis not present

## 2020-03-01 DIAGNOSIS — I672 Cerebral atherosclerosis: Secondary | ICD-10-CM | POA: Diagnosis not present

## 2020-03-01 DIAGNOSIS — N183 Chronic kidney disease, stage 3 unspecified: Secondary | ICD-10-CM | POA: Diagnosis not present

## 2020-03-01 DIAGNOSIS — I130 Hypertensive heart and chronic kidney disease with heart failure and stage 1 through stage 4 chronic kidney disease, or unspecified chronic kidney disease: Secondary | ICD-10-CM | POA: Diagnosis not present

## 2020-03-01 DIAGNOSIS — I5032 Chronic diastolic (congestive) heart failure: Secondary | ICD-10-CM | POA: Diagnosis not present

## 2020-03-02 DIAGNOSIS — N183 Chronic kidney disease, stage 3 unspecified: Secondary | ICD-10-CM | POA: Diagnosis not present

## 2020-03-02 DIAGNOSIS — F0280 Dementia in other diseases classified elsewhere without behavioral disturbance: Secondary | ICD-10-CM | POA: Diagnosis not present

## 2020-03-02 DIAGNOSIS — I130 Hypertensive heart and chronic kidney disease with heart failure and stage 1 through stage 4 chronic kidney disease, or unspecified chronic kidney disease: Secondary | ICD-10-CM | POA: Diagnosis not present

## 2020-03-02 DIAGNOSIS — G309 Alzheimer's disease, unspecified: Secondary | ICD-10-CM | POA: Diagnosis not present

## 2020-03-02 DIAGNOSIS — I5032 Chronic diastolic (congestive) heart failure: Secondary | ICD-10-CM | POA: Diagnosis not present

## 2020-03-02 DIAGNOSIS — I672 Cerebral atherosclerosis: Secondary | ICD-10-CM | POA: Diagnosis not present

## 2020-03-05 DIAGNOSIS — F0390 Unspecified dementia without behavioral disturbance: Secondary | ICD-10-CM | POA: Diagnosis not present

## 2020-03-05 DIAGNOSIS — I701 Atherosclerosis of renal artery: Secondary | ICD-10-CM | POA: Diagnosis not present

## 2020-03-05 DIAGNOSIS — N183 Chronic kidney disease, stage 3 unspecified: Secondary | ICD-10-CM | POA: Diagnosis not present

## 2020-03-05 DIAGNOSIS — I480 Paroxysmal atrial fibrillation: Secondary | ICD-10-CM | POA: Diagnosis not present

## 2020-03-05 DIAGNOSIS — F0280 Dementia in other diseases classified elsewhere without behavioral disturbance: Secondary | ICD-10-CM | POA: Diagnosis not present

## 2020-03-05 DIAGNOSIS — I672 Cerebral atherosclerosis: Secondary | ICD-10-CM | POA: Diagnosis not present

## 2020-03-05 DIAGNOSIS — E119 Type 2 diabetes mellitus without complications: Secondary | ICD-10-CM | POA: Diagnosis not present

## 2020-03-05 DIAGNOSIS — I130 Hypertensive heart and chronic kidney disease with heart failure and stage 1 through stage 4 chronic kidney disease, or unspecified chronic kidney disease: Secondary | ICD-10-CM | POA: Diagnosis not present

## 2020-03-05 DIAGNOSIS — I4891 Unspecified atrial fibrillation: Secondary | ICD-10-CM | POA: Diagnosis not present

## 2020-03-05 DIAGNOSIS — G459 Transient cerebral ischemic attack, unspecified: Secondary | ICD-10-CM | POA: Diagnosis not present

## 2020-03-05 DIAGNOSIS — I5032 Chronic diastolic (congestive) heart failure: Secondary | ICD-10-CM | POA: Diagnosis not present

## 2020-03-05 DIAGNOSIS — G309 Alzheimer's disease, unspecified: Secondary | ICD-10-CM | POA: Diagnosis not present

## 2020-03-05 DIAGNOSIS — I10 Essential (primary) hypertension: Secondary | ICD-10-CM | POA: Diagnosis not present

## 2020-03-05 DIAGNOSIS — E782 Mixed hyperlipidemia: Secondary | ICD-10-CM | POA: Diagnosis not present

## 2020-03-05 DIAGNOSIS — I509 Heart failure, unspecified: Secondary | ICD-10-CM | POA: Diagnosis not present

## 2020-03-05 DIAGNOSIS — F341 Dysthymic disorder: Secondary | ICD-10-CM | POA: Diagnosis not present

## 2020-03-06 DIAGNOSIS — N183 Chronic kidney disease, stage 3 unspecified: Secondary | ICD-10-CM | POA: Diagnosis not present

## 2020-03-06 DIAGNOSIS — I672 Cerebral atherosclerosis: Secondary | ICD-10-CM | POA: Diagnosis not present

## 2020-03-06 DIAGNOSIS — Z7901 Long term (current) use of anticoagulants: Secondary | ICD-10-CM | POA: Diagnosis not present

## 2020-03-06 DIAGNOSIS — I081 Rheumatic disorders of both mitral and tricuspid valves: Secondary | ICD-10-CM | POA: Diagnosis not present

## 2020-03-06 DIAGNOSIS — I5032 Chronic diastolic (congestive) heart failure: Secondary | ICD-10-CM | POA: Diagnosis not present

## 2020-03-06 DIAGNOSIS — R32 Unspecified urinary incontinence: Secondary | ICD-10-CM | POA: Diagnosis not present

## 2020-03-06 DIAGNOSIS — I130 Hypertensive heart and chronic kidney disease with heart failure and stage 1 through stage 4 chronic kidney disease, or unspecified chronic kidney disease: Secondary | ICD-10-CM | POA: Diagnosis not present

## 2020-03-06 DIAGNOSIS — G309 Alzheimer's disease, unspecified: Secondary | ICD-10-CM | POA: Diagnosis not present

## 2020-03-06 DIAGNOSIS — I701 Atherosclerosis of renal artery: Secondary | ICD-10-CM | POA: Diagnosis not present

## 2020-03-06 DIAGNOSIS — N6019 Diffuse cystic mastopathy of unspecified breast: Secondary | ICD-10-CM | POA: Diagnosis not present

## 2020-03-06 DIAGNOSIS — F0280 Dementia in other diseases classified elsewhere without behavioral disturbance: Secondary | ICD-10-CM | POA: Diagnosis not present

## 2020-03-06 DIAGNOSIS — I773 Arterial fibromuscular dysplasia: Secondary | ICD-10-CM | POA: Diagnosis not present

## 2020-03-06 DIAGNOSIS — I480 Paroxysmal atrial fibrillation: Secondary | ICD-10-CM | POA: Diagnosis not present

## 2020-03-06 DIAGNOSIS — Z8673 Personal history of transient ischemic attack (TIA), and cerebral infarction without residual deficits: Secondary | ICD-10-CM | POA: Diagnosis not present

## 2020-03-07 DIAGNOSIS — I672 Cerebral atherosclerosis: Secondary | ICD-10-CM | POA: Diagnosis not present

## 2020-03-07 DIAGNOSIS — F0280 Dementia in other diseases classified elsewhere without behavioral disturbance: Secondary | ICD-10-CM | POA: Diagnosis not present

## 2020-03-07 DIAGNOSIS — I130 Hypertensive heart and chronic kidney disease with heart failure and stage 1 through stage 4 chronic kidney disease, or unspecified chronic kidney disease: Secondary | ICD-10-CM | POA: Diagnosis not present

## 2020-03-07 DIAGNOSIS — N183 Chronic kidney disease, stage 3 unspecified: Secondary | ICD-10-CM | POA: Diagnosis not present

## 2020-03-07 DIAGNOSIS — I5032 Chronic diastolic (congestive) heart failure: Secondary | ICD-10-CM | POA: Diagnosis not present

## 2020-03-07 DIAGNOSIS — G309 Alzheimer's disease, unspecified: Secondary | ICD-10-CM | POA: Diagnosis not present

## 2020-03-08 DIAGNOSIS — I130 Hypertensive heart and chronic kidney disease with heart failure and stage 1 through stage 4 chronic kidney disease, or unspecified chronic kidney disease: Secondary | ICD-10-CM | POA: Diagnosis not present

## 2020-03-08 DIAGNOSIS — G309 Alzheimer's disease, unspecified: Secondary | ICD-10-CM | POA: Diagnosis not present

## 2020-03-08 DIAGNOSIS — N183 Chronic kidney disease, stage 3 unspecified: Secondary | ICD-10-CM | POA: Diagnosis not present

## 2020-03-08 DIAGNOSIS — I5032 Chronic diastolic (congestive) heart failure: Secondary | ICD-10-CM | POA: Diagnosis not present

## 2020-03-08 DIAGNOSIS — F0280 Dementia in other diseases classified elsewhere without behavioral disturbance: Secondary | ICD-10-CM | POA: Diagnosis not present

## 2020-03-08 DIAGNOSIS — I672 Cerebral atherosclerosis: Secondary | ICD-10-CM | POA: Diagnosis not present

## 2020-03-09 DIAGNOSIS — G309 Alzheimer's disease, unspecified: Secondary | ICD-10-CM | POA: Diagnosis not present

## 2020-03-09 DIAGNOSIS — N183 Chronic kidney disease, stage 3 unspecified: Secondary | ICD-10-CM | POA: Diagnosis not present

## 2020-03-09 DIAGNOSIS — I5032 Chronic diastolic (congestive) heart failure: Secondary | ICD-10-CM | POA: Diagnosis not present

## 2020-03-09 DIAGNOSIS — I130 Hypertensive heart and chronic kidney disease with heart failure and stage 1 through stage 4 chronic kidney disease, or unspecified chronic kidney disease: Secondary | ICD-10-CM | POA: Diagnosis not present

## 2020-03-09 DIAGNOSIS — F0280 Dementia in other diseases classified elsewhere without behavioral disturbance: Secondary | ICD-10-CM | POA: Diagnosis not present

## 2020-03-09 DIAGNOSIS — I672 Cerebral atherosclerosis: Secondary | ICD-10-CM | POA: Diagnosis not present

## 2020-03-12 DIAGNOSIS — N183 Chronic kidney disease, stage 3 unspecified: Secondary | ICD-10-CM | POA: Diagnosis not present

## 2020-03-12 DIAGNOSIS — G309 Alzheimer's disease, unspecified: Secondary | ICD-10-CM | POA: Diagnosis not present

## 2020-03-12 DIAGNOSIS — F0280 Dementia in other diseases classified elsewhere without behavioral disturbance: Secondary | ICD-10-CM | POA: Diagnosis not present

## 2020-03-12 DIAGNOSIS — I5032 Chronic diastolic (congestive) heart failure: Secondary | ICD-10-CM | POA: Diagnosis not present

## 2020-03-12 DIAGNOSIS — I672 Cerebral atherosclerosis: Secondary | ICD-10-CM | POA: Diagnosis not present

## 2020-03-12 DIAGNOSIS — I130 Hypertensive heart and chronic kidney disease with heart failure and stage 1 through stage 4 chronic kidney disease, or unspecified chronic kidney disease: Secondary | ICD-10-CM | POA: Diagnosis not present

## 2020-03-13 DIAGNOSIS — F0280 Dementia in other diseases classified elsewhere without behavioral disturbance: Secondary | ICD-10-CM | POA: Diagnosis not present

## 2020-03-13 DIAGNOSIS — I672 Cerebral atherosclerosis: Secondary | ICD-10-CM | POA: Diagnosis not present

## 2020-03-13 DIAGNOSIS — I130 Hypertensive heart and chronic kidney disease with heart failure and stage 1 through stage 4 chronic kidney disease, or unspecified chronic kidney disease: Secondary | ICD-10-CM | POA: Diagnosis not present

## 2020-03-13 DIAGNOSIS — G309 Alzheimer's disease, unspecified: Secondary | ICD-10-CM | POA: Diagnosis not present

## 2020-03-13 DIAGNOSIS — I5032 Chronic diastolic (congestive) heart failure: Secondary | ICD-10-CM | POA: Diagnosis not present

## 2020-03-13 DIAGNOSIS — N183 Chronic kidney disease, stage 3 unspecified: Secondary | ICD-10-CM | POA: Diagnosis not present

## 2020-03-15 DIAGNOSIS — G309 Alzheimer's disease, unspecified: Secondary | ICD-10-CM | POA: Diagnosis not present

## 2020-03-15 DIAGNOSIS — I130 Hypertensive heart and chronic kidney disease with heart failure and stage 1 through stage 4 chronic kidney disease, or unspecified chronic kidney disease: Secondary | ICD-10-CM | POA: Diagnosis not present

## 2020-03-15 DIAGNOSIS — F0280 Dementia in other diseases classified elsewhere without behavioral disturbance: Secondary | ICD-10-CM | POA: Diagnosis not present

## 2020-03-15 DIAGNOSIS — I5032 Chronic diastolic (congestive) heart failure: Secondary | ICD-10-CM | POA: Diagnosis not present

## 2020-03-15 DIAGNOSIS — I672 Cerebral atherosclerosis: Secondary | ICD-10-CM | POA: Diagnosis not present

## 2020-03-15 DIAGNOSIS — N183 Chronic kidney disease, stage 3 unspecified: Secondary | ICD-10-CM | POA: Diagnosis not present

## 2020-03-16 DIAGNOSIS — N183 Chronic kidney disease, stage 3 unspecified: Secondary | ICD-10-CM | POA: Diagnosis not present

## 2020-03-16 DIAGNOSIS — I672 Cerebral atherosclerosis: Secondary | ICD-10-CM | POA: Diagnosis not present

## 2020-03-16 DIAGNOSIS — G309 Alzheimer's disease, unspecified: Secondary | ICD-10-CM | POA: Diagnosis not present

## 2020-03-16 DIAGNOSIS — I130 Hypertensive heart and chronic kidney disease with heart failure and stage 1 through stage 4 chronic kidney disease, or unspecified chronic kidney disease: Secondary | ICD-10-CM | POA: Diagnosis not present

## 2020-03-16 DIAGNOSIS — F0280 Dementia in other diseases classified elsewhere without behavioral disturbance: Secondary | ICD-10-CM | POA: Diagnosis not present

## 2020-03-16 DIAGNOSIS — I5032 Chronic diastolic (congestive) heart failure: Secondary | ICD-10-CM | POA: Diagnosis not present

## 2020-03-19 DIAGNOSIS — N183 Chronic kidney disease, stage 3 unspecified: Secondary | ICD-10-CM | POA: Diagnosis not present

## 2020-03-19 DIAGNOSIS — I672 Cerebral atherosclerosis: Secondary | ICD-10-CM | POA: Diagnosis not present

## 2020-03-19 DIAGNOSIS — I5032 Chronic diastolic (congestive) heart failure: Secondary | ICD-10-CM | POA: Diagnosis not present

## 2020-03-19 DIAGNOSIS — F0280 Dementia in other diseases classified elsewhere without behavioral disturbance: Secondary | ICD-10-CM | POA: Diagnosis not present

## 2020-03-19 DIAGNOSIS — I130 Hypertensive heart and chronic kidney disease with heart failure and stage 1 through stage 4 chronic kidney disease, or unspecified chronic kidney disease: Secondary | ICD-10-CM | POA: Diagnosis not present

## 2020-03-19 DIAGNOSIS — G309 Alzheimer's disease, unspecified: Secondary | ICD-10-CM | POA: Diagnosis not present

## 2020-03-20 DIAGNOSIS — I672 Cerebral atherosclerosis: Secondary | ICD-10-CM | POA: Diagnosis not present

## 2020-03-20 DIAGNOSIS — I5032 Chronic diastolic (congestive) heart failure: Secondary | ICD-10-CM | POA: Diagnosis not present

## 2020-03-20 DIAGNOSIS — N183 Chronic kidney disease, stage 3 unspecified: Secondary | ICD-10-CM | POA: Diagnosis not present

## 2020-03-20 DIAGNOSIS — G309 Alzheimer's disease, unspecified: Secondary | ICD-10-CM | POA: Diagnosis not present

## 2020-03-20 DIAGNOSIS — I130 Hypertensive heart and chronic kidney disease with heart failure and stage 1 through stage 4 chronic kidney disease, or unspecified chronic kidney disease: Secondary | ICD-10-CM | POA: Diagnosis not present

## 2020-03-20 DIAGNOSIS — F0280 Dementia in other diseases classified elsewhere without behavioral disturbance: Secondary | ICD-10-CM | POA: Diagnosis not present

## 2020-03-22 DIAGNOSIS — I5032 Chronic diastolic (congestive) heart failure: Secondary | ICD-10-CM | POA: Diagnosis not present

## 2020-03-22 DIAGNOSIS — N183 Chronic kidney disease, stage 3 unspecified: Secondary | ICD-10-CM | POA: Diagnosis not present

## 2020-03-22 DIAGNOSIS — F0280 Dementia in other diseases classified elsewhere without behavioral disturbance: Secondary | ICD-10-CM | POA: Diagnosis not present

## 2020-03-22 DIAGNOSIS — I130 Hypertensive heart and chronic kidney disease with heart failure and stage 1 through stage 4 chronic kidney disease, or unspecified chronic kidney disease: Secondary | ICD-10-CM | POA: Diagnosis not present

## 2020-03-22 DIAGNOSIS — I672 Cerebral atherosclerosis: Secondary | ICD-10-CM | POA: Diagnosis not present

## 2020-03-22 DIAGNOSIS — G309 Alzheimer's disease, unspecified: Secondary | ICD-10-CM | POA: Diagnosis not present

## 2020-03-23 DIAGNOSIS — N183 Chronic kidney disease, stage 3 unspecified: Secondary | ICD-10-CM | POA: Diagnosis not present

## 2020-03-23 DIAGNOSIS — I130 Hypertensive heart and chronic kidney disease with heart failure and stage 1 through stage 4 chronic kidney disease, or unspecified chronic kidney disease: Secondary | ICD-10-CM | POA: Diagnosis not present

## 2020-03-23 DIAGNOSIS — F0280 Dementia in other diseases classified elsewhere without behavioral disturbance: Secondary | ICD-10-CM | POA: Diagnosis not present

## 2020-03-23 DIAGNOSIS — G309 Alzheimer's disease, unspecified: Secondary | ICD-10-CM | POA: Diagnosis not present

## 2020-03-23 DIAGNOSIS — I672 Cerebral atherosclerosis: Secondary | ICD-10-CM | POA: Diagnosis not present

## 2020-03-23 DIAGNOSIS — I5032 Chronic diastolic (congestive) heart failure: Secondary | ICD-10-CM | POA: Diagnosis not present

## 2020-03-26 DIAGNOSIS — N183 Chronic kidney disease, stage 3 unspecified: Secondary | ICD-10-CM | POA: Diagnosis not present

## 2020-03-26 DIAGNOSIS — I130 Hypertensive heart and chronic kidney disease with heart failure and stage 1 through stage 4 chronic kidney disease, or unspecified chronic kidney disease: Secondary | ICD-10-CM | POA: Diagnosis not present

## 2020-03-26 DIAGNOSIS — G309 Alzheimer's disease, unspecified: Secondary | ICD-10-CM | POA: Diagnosis not present

## 2020-03-26 DIAGNOSIS — F0280 Dementia in other diseases classified elsewhere without behavioral disturbance: Secondary | ICD-10-CM | POA: Diagnosis not present

## 2020-03-26 DIAGNOSIS — I5032 Chronic diastolic (congestive) heart failure: Secondary | ICD-10-CM | POA: Diagnosis not present

## 2020-03-26 DIAGNOSIS — I672 Cerebral atherosclerosis: Secondary | ICD-10-CM | POA: Diagnosis not present

## 2020-03-27 DIAGNOSIS — F0280 Dementia in other diseases classified elsewhere without behavioral disturbance: Secondary | ICD-10-CM | POA: Diagnosis not present

## 2020-03-27 DIAGNOSIS — I5032 Chronic diastolic (congestive) heart failure: Secondary | ICD-10-CM | POA: Diagnosis not present

## 2020-03-27 DIAGNOSIS — I130 Hypertensive heart and chronic kidney disease with heart failure and stage 1 through stage 4 chronic kidney disease, or unspecified chronic kidney disease: Secondary | ICD-10-CM | POA: Diagnosis not present

## 2020-03-27 DIAGNOSIS — N183 Chronic kidney disease, stage 3 unspecified: Secondary | ICD-10-CM | POA: Diagnosis not present

## 2020-03-27 DIAGNOSIS — G309 Alzheimer's disease, unspecified: Secondary | ICD-10-CM | POA: Diagnosis not present

## 2020-03-27 DIAGNOSIS — I672 Cerebral atherosclerosis: Secondary | ICD-10-CM | POA: Diagnosis not present

## 2020-03-28 DIAGNOSIS — M7581 Other shoulder lesions, right shoulder: Secondary | ICD-10-CM | POA: Diagnosis not present

## 2020-03-28 DIAGNOSIS — M79671 Pain in right foot: Secondary | ICD-10-CM | POA: Diagnosis not present

## 2020-03-29 DIAGNOSIS — G309 Alzheimer's disease, unspecified: Secondary | ICD-10-CM | POA: Diagnosis not present

## 2020-03-29 DIAGNOSIS — I5032 Chronic diastolic (congestive) heart failure: Secondary | ICD-10-CM | POA: Diagnosis not present

## 2020-03-29 DIAGNOSIS — F0280 Dementia in other diseases classified elsewhere without behavioral disturbance: Secondary | ICD-10-CM | POA: Diagnosis not present

## 2020-03-29 DIAGNOSIS — I130 Hypertensive heart and chronic kidney disease with heart failure and stage 1 through stage 4 chronic kidney disease, or unspecified chronic kidney disease: Secondary | ICD-10-CM | POA: Diagnosis not present

## 2020-03-29 DIAGNOSIS — I672 Cerebral atherosclerosis: Secondary | ICD-10-CM | POA: Diagnosis not present

## 2020-03-29 DIAGNOSIS — N183 Chronic kidney disease, stage 3 unspecified: Secondary | ICD-10-CM | POA: Diagnosis not present

## 2020-03-30 DIAGNOSIS — I130 Hypertensive heart and chronic kidney disease with heart failure and stage 1 through stage 4 chronic kidney disease, or unspecified chronic kidney disease: Secondary | ICD-10-CM | POA: Diagnosis not present

## 2020-03-30 DIAGNOSIS — N183 Chronic kidney disease, stage 3 unspecified: Secondary | ICD-10-CM | POA: Diagnosis not present

## 2020-03-30 DIAGNOSIS — I5032 Chronic diastolic (congestive) heart failure: Secondary | ICD-10-CM | POA: Diagnosis not present

## 2020-03-30 DIAGNOSIS — G309 Alzheimer's disease, unspecified: Secondary | ICD-10-CM | POA: Diagnosis not present

## 2020-03-30 DIAGNOSIS — I672 Cerebral atherosclerosis: Secondary | ICD-10-CM | POA: Diagnosis not present

## 2020-03-30 DIAGNOSIS — F0280 Dementia in other diseases classified elsewhere without behavioral disturbance: Secondary | ICD-10-CM | POA: Diagnosis not present

## 2020-04-01 DIAGNOSIS — F0280 Dementia in other diseases classified elsewhere without behavioral disturbance: Secondary | ICD-10-CM | POA: Diagnosis not present

## 2020-04-01 DIAGNOSIS — I5032 Chronic diastolic (congestive) heart failure: Secondary | ICD-10-CM | POA: Diagnosis not present

## 2020-04-01 DIAGNOSIS — I130 Hypertensive heart and chronic kidney disease with heart failure and stage 1 through stage 4 chronic kidney disease, or unspecified chronic kidney disease: Secondary | ICD-10-CM | POA: Diagnosis not present

## 2020-04-01 DIAGNOSIS — I672 Cerebral atherosclerosis: Secondary | ICD-10-CM | POA: Diagnosis not present

## 2020-04-01 DIAGNOSIS — G309 Alzheimer's disease, unspecified: Secondary | ICD-10-CM | POA: Diagnosis not present

## 2020-04-01 DIAGNOSIS — N183 Chronic kidney disease, stage 3 unspecified: Secondary | ICD-10-CM | POA: Diagnosis not present

## 2020-04-02 DIAGNOSIS — I672 Cerebral atherosclerosis: Secondary | ICD-10-CM | POA: Diagnosis not present

## 2020-04-02 DIAGNOSIS — G309 Alzheimer's disease, unspecified: Secondary | ICD-10-CM | POA: Diagnosis not present

## 2020-04-02 DIAGNOSIS — I5032 Chronic diastolic (congestive) heart failure: Secondary | ICD-10-CM | POA: Diagnosis not present

## 2020-04-02 DIAGNOSIS — F0280 Dementia in other diseases classified elsewhere without behavioral disturbance: Secondary | ICD-10-CM | POA: Diagnosis not present

## 2020-04-02 DIAGNOSIS — N183 Chronic kidney disease, stage 3 unspecified: Secondary | ICD-10-CM | POA: Diagnosis not present

## 2020-04-02 DIAGNOSIS — I130 Hypertensive heart and chronic kidney disease with heart failure and stage 1 through stage 4 chronic kidney disease, or unspecified chronic kidney disease: Secondary | ICD-10-CM | POA: Diagnosis not present

## 2020-04-03 DIAGNOSIS — I130 Hypertensive heart and chronic kidney disease with heart failure and stage 1 through stage 4 chronic kidney disease, or unspecified chronic kidney disease: Secondary | ICD-10-CM | POA: Diagnosis not present

## 2020-04-03 DIAGNOSIS — G309 Alzheimer's disease, unspecified: Secondary | ICD-10-CM | POA: Diagnosis not present

## 2020-04-03 DIAGNOSIS — F0280 Dementia in other diseases classified elsewhere without behavioral disturbance: Secondary | ICD-10-CM | POA: Diagnosis not present

## 2020-04-03 DIAGNOSIS — I672 Cerebral atherosclerosis: Secondary | ICD-10-CM | POA: Diagnosis not present

## 2020-04-03 DIAGNOSIS — I5032 Chronic diastolic (congestive) heart failure: Secondary | ICD-10-CM | POA: Diagnosis not present

## 2020-04-03 DIAGNOSIS — N183 Chronic kidney disease, stage 3 unspecified: Secondary | ICD-10-CM | POA: Diagnosis not present

## 2020-04-04 DIAGNOSIS — N183 Chronic kidney disease, stage 3 unspecified: Secondary | ICD-10-CM | POA: Diagnosis not present

## 2020-04-04 DIAGNOSIS — I5032 Chronic diastolic (congestive) heart failure: Secondary | ICD-10-CM | POA: Diagnosis not present

## 2020-04-04 DIAGNOSIS — I672 Cerebral atherosclerosis: Secondary | ICD-10-CM | POA: Diagnosis not present

## 2020-04-04 DIAGNOSIS — F0280 Dementia in other diseases classified elsewhere without behavioral disturbance: Secondary | ICD-10-CM | POA: Diagnosis not present

## 2020-04-04 DIAGNOSIS — I130 Hypertensive heart and chronic kidney disease with heart failure and stage 1 through stage 4 chronic kidney disease, or unspecified chronic kidney disease: Secondary | ICD-10-CM | POA: Diagnosis not present

## 2020-04-04 DIAGNOSIS — G309 Alzheimer's disease, unspecified: Secondary | ICD-10-CM | POA: Diagnosis not present

## 2020-04-05 DIAGNOSIS — I130 Hypertensive heart and chronic kidney disease with heart failure and stage 1 through stage 4 chronic kidney disease, or unspecified chronic kidney disease: Secondary | ICD-10-CM | POA: Diagnosis not present

## 2020-04-05 DIAGNOSIS — G309 Alzheimer's disease, unspecified: Secondary | ICD-10-CM | POA: Diagnosis not present

## 2020-04-05 DIAGNOSIS — I672 Cerebral atherosclerosis: Secondary | ICD-10-CM | POA: Diagnosis not present

## 2020-04-05 DIAGNOSIS — I5032 Chronic diastolic (congestive) heart failure: Secondary | ICD-10-CM | POA: Diagnosis not present

## 2020-04-05 DIAGNOSIS — N183 Chronic kidney disease, stage 3 unspecified: Secondary | ICD-10-CM | POA: Diagnosis not present

## 2020-04-05 DIAGNOSIS — F0280 Dementia in other diseases classified elsewhere without behavioral disturbance: Secondary | ICD-10-CM | POA: Diagnosis not present

## 2020-04-06 ENCOUNTER — Other Ambulatory Visit: Payer: Self-pay | Admitting: *Deleted

## 2020-04-06 DIAGNOSIS — N6019 Diffuse cystic mastopathy of unspecified breast: Secondary | ICD-10-CM | POA: Diagnosis not present

## 2020-04-06 DIAGNOSIS — I701 Atherosclerosis of renal artery: Secondary | ICD-10-CM | POA: Diagnosis not present

## 2020-04-06 DIAGNOSIS — I672 Cerebral atherosclerosis: Secondary | ICD-10-CM | POA: Diagnosis not present

## 2020-04-06 DIAGNOSIS — G309 Alzheimer's disease, unspecified: Secondary | ICD-10-CM | POA: Diagnosis not present

## 2020-04-06 DIAGNOSIS — I130 Hypertensive heart and chronic kidney disease with heart failure and stage 1 through stage 4 chronic kidney disease, or unspecified chronic kidney disease: Secondary | ICD-10-CM | POA: Diagnosis not present

## 2020-04-06 DIAGNOSIS — I081 Rheumatic disorders of both mitral and tricuspid valves: Secondary | ICD-10-CM | POA: Diagnosis not present

## 2020-04-06 DIAGNOSIS — Z8673 Personal history of transient ischemic attack (TIA), and cerebral infarction without residual deficits: Secondary | ICD-10-CM | POA: Diagnosis not present

## 2020-04-06 DIAGNOSIS — N183 Chronic kidney disease, stage 3 unspecified: Secondary | ICD-10-CM | POA: Diagnosis not present

## 2020-04-06 DIAGNOSIS — I773 Arterial fibromuscular dysplasia: Secondary | ICD-10-CM | POA: Diagnosis not present

## 2020-04-06 DIAGNOSIS — Z7901 Long term (current) use of anticoagulants: Secondary | ICD-10-CM | POA: Diagnosis not present

## 2020-04-06 DIAGNOSIS — I480 Paroxysmal atrial fibrillation: Secondary | ICD-10-CM | POA: Diagnosis not present

## 2020-04-06 DIAGNOSIS — R32 Unspecified urinary incontinence: Secondary | ICD-10-CM | POA: Diagnosis not present

## 2020-04-06 DIAGNOSIS — I5032 Chronic diastolic (congestive) heart failure: Secondary | ICD-10-CM | POA: Diagnosis not present

## 2020-04-06 DIAGNOSIS — F0280 Dementia in other diseases classified elsewhere without behavioral disturbance: Secondary | ICD-10-CM | POA: Diagnosis not present

## 2020-04-06 MED ORDER — DOFETILIDE 125 MCG PO CAPS: 375.0000 ug | ORAL_CAPSULE | Freq: Two times a day (BID) | ORAL | 2 refills | Status: DC

## 2020-04-07 ENCOUNTER — Emergency Department (HOSPITAL_COMMUNITY)
Admission: EM | Admit: 2020-04-07 | Discharge: 2020-04-08 | Disposition: A | Discharge status: Home / Self Care | Payer: Medicare Other | Attending: Emergency Medicine | Admitting: Emergency Medicine

## 2020-04-07 ENCOUNTER — Emergency Department (HOSPITAL_COMMUNITY): Payer: Medicare Other

## 2020-04-07 ENCOUNTER — Other Ambulatory Visit (HOSPITAL_COMMUNITY): Payer: Self-pay

## 2020-04-07 DIAGNOSIS — W19XXXA Unspecified fall, initial encounter: Secondary | ICD-10-CM | POA: Diagnosis not present

## 2020-04-07 DIAGNOSIS — Z955 Presence of coronary angioplasty implant and graft: Secondary | ICD-10-CM | POA: Diagnosis not present

## 2020-04-07 DIAGNOSIS — S31811A Laceration without foreign body of right buttock, initial encounter: Secondary | ICD-10-CM | POA: Diagnosis not present

## 2020-04-07 DIAGNOSIS — Z79899 Other long term (current) drug therapy: Secondary | ICD-10-CM | POA: Diagnosis not present

## 2020-04-07 DIAGNOSIS — F0390 Unspecified dementia without behavioral disturbance: Secondary | ICD-10-CM | POA: Insufficient documentation

## 2020-04-07 DIAGNOSIS — R58 Hemorrhage, not elsewhere classified: Secondary | ICD-10-CM | POA: Diagnosis not present

## 2020-04-07 DIAGNOSIS — Z7901 Long term (current) use of anticoagulants: Secondary | ICD-10-CM | POA: Insufficient documentation

## 2020-04-07 DIAGNOSIS — K862 Cyst of pancreas: Secondary | ICD-10-CM | POA: Diagnosis not present

## 2020-04-07 DIAGNOSIS — W25XXXA Contact with sharp glass, initial encounter: Secondary | ICD-10-CM | POA: Insufficient documentation

## 2020-04-07 DIAGNOSIS — N183 Chronic kidney disease, stage 3 unspecified: Secondary | ICD-10-CM | POA: Insufficient documentation

## 2020-04-07 DIAGNOSIS — Z23 Encounter for immunization: Secondary | ICD-10-CM | POA: Insufficient documentation

## 2020-04-07 DIAGNOSIS — I5032 Chronic diastolic (congestive) heart failure: Secondary | ICD-10-CM | POA: Diagnosis not present

## 2020-04-07 DIAGNOSIS — R001 Bradycardia, unspecified: Secondary | ICD-10-CM | POA: Diagnosis not present

## 2020-04-07 DIAGNOSIS — I130 Hypertensive heart and chronic kidney disease with heart failure and stage 1 through stage 4 chronic kidney disease, or unspecified chronic kidney disease: Secondary | ICD-10-CM | POA: Insufficient documentation

## 2020-04-07 DIAGNOSIS — S3091XA Unspecified superficial injury of lower back and pelvis, initial encounter: Secondary | ICD-10-CM | POA: Diagnosis present

## 2020-04-07 MED ORDER — TETANUS-DIPHTH-ACELL PERTUSSIS 5-2.5-18.5 LF-MCG/0.5 IM SUSY
0.5000 mL | PREFILLED_SYRINGE | Freq: Once | INTRAMUSCULAR | Status: AC
  Administered 2020-04-07: 0.5 mL via INTRAMUSCULAR
  Filled 2020-04-07: qty 0.5

## 2020-04-07 MED ORDER — LIDOCAINE-EPINEPHRINE 1 %-1:100000 IJ SOLN
20.0000 mL | Freq: Once | INTRAMUSCULAR | Status: AC
  Administered 2020-04-07: 20 mL
  Filled 2020-04-07: qty 1

## 2020-04-07 NOTE — ED Provider Notes (Signed)
[facility] EMERGENCY DEPARTMENT Provider Note   CSN: [id number] Arrival date & time: 04/07/20  2301     History No chief complaint on file.   Donna [name] is a 85 y.o. female.  Level 5 caveat for dementia.  Patient from home after a fall.  EMS reports that she knocked over a glass lamp and landed on the pieces and sustained some lacerations to her buttocks.  She does take Eliquis.  Patient does not recall what happened.  EMS reports at least 2 lacerations of her buttocks that are hemostatic.  She denies any head, neck, back, chest or abdominal pain.  She denies any dizziness or lightheadedness.  She denies any focal weakness, numbness or tingling.  She denies any fevers or chills. She is unable to give much of a history.  Unknown last tetanus shot.  Vital stable for EMS.  The history is provided by the patient and the EMS personnel. The history is limited by the condition of the patient.       Past Medical History:  Diagnosis Date  . Alzheimer disease ([place]) 06/27/2019  . Atherosclerosis of renal artery (HCC)    a. s/p R RA stenting;  b. 04/2014 Renal Art duplex: Patent R RA stent, <60% L RA stenosis. Followed by [facility].  . Carotid arterial disease ([place])    a. 01/2014 Carotid U/S: RICA 78%, LICA 93%. Followed by [facility].  . CKD (chronic kidney disease), stage III (HCC)    Stage II/III  . Essential hypertension   . Fibrocystic breast   . Fibromuscular dysplasia ([place])   . Hyperglycemia   . Mitral regurgitation    a. Echo 08/2014: mild MR.  . Paroxysmal atrial fibrillation ([place])    a. Dx 07/26/2014, CHA2DS2VASc = 5-->Eliquis;  c. 08/2014 Echo: EF 55-60%, no rwma, mildly dil LA/RA, mod-sev TR, PASP 22mmHg. b. s/p DCCV 08/2014. c. back in atrial flutter 09/2014 -> rate control pursued.  . Paroxysmal atrial flutter ([place])    a. Dx 07/26/2014, CHA2DS2VASc = 5-->Eliquis;  c. 08/2014 Echo: EF 55-60%, no rwma, mildly dil LA/RA, mod-sev TR, PASP 70mmHg. b. s/p DCCV 08/2014. c. back  in atrial flutter 09/2014 -> rate control pursued.  . Tricuspid regurgitation    a. Echo 08/2014: mod-severe.    Patient Active Problem List   Diagnosis Date Noted  . TIA (transient ischemic attack) 01/08/2020  . Alzheimer disease ([place]) 06/27/2019  . Headache 12/21/2018  . Chronic anticoagulation 02/22/2015  . Chronic atrial fibrillation ([place]) 11/19/2014  . Chronic diastolic HF (heart failure) ([place]) 11/19/2014  . CKD (chronic kidney disease), stage III ([place]) 11/19/2014  . Fibromuscular dysplasia ([place])   . Carotid arterial disease ([place])   . Hypertension 05/28/2011  . Menopause 05/28/2011  . Atherosclerotic RAS (renal artery stenosis), bilateral ([place]) 06/28/2010    Past Surgical History:  Procedure Laterality Date  . CARDIOVERSION N/A 09/01/2014   Procedure: CARDIOVERSION;  Surgeon: Josue Hector, MD;  Location: [facility] ENDOSCOPY;  Service: Cardiovascular;  Laterality: N/A;  . CARDIOVERSION N/A 11/23/2014   Procedure: CARDIOVERSION;  Surgeon: Skeet Latch, MD;  Location: [facility];  Service: Cardiovascular;  Laterality: N/A;  . HEMORROIDECTOMY    . REDUCTION MAMMAPLASTY Bilateral   . RENAL ARTERY STENT  2008   Right renal artery by Dr. Drucie Opitz  . TONSILLECTOMY    . TUBAL LIGATION  1970's     OB History    Gravida  4   Para  4   Term  4   Preterm  0   AB  0   Living  4     SAB  0   IAB  0   Ectopic  0   Multiple  0   Live Births              Family History  Problem Relation Age of Onset  . Stroke Mother 31  . Diabetes Mother   . Hypertension Mother   . Aneurysm Father        abdominal aortic aneurysm  . Diabetes Father   . Hypertension Father   . Heart attack Father   . Cancer Sister 72       breast cancer  . Breast cancer Neg Hx     Social History   Tobacco Use  . Smoking status: Never Smoker  . Smokeless tobacco: Never Used  Vaping Use  . Vaping Use: Never used  Substance Use Topics  . Alcohol use: Yes  . Drug use: No     Home Medications Prior to Admission medications   Medication Sig Start Date End Date Taking? Authorizing Provider  amLODipine (NORVASC) 10 MG tablet Take 1 tablet by mouth once daily 02/10/20   Burtis Junes, NP  apixaban (ELIQUIS) 2.5 MG TABS tablet Take 1 tablet (2.5 mg total) by mouth 2 (two) times daily. 08/17/17   Burtis Junes, NP  atorvastatin (LIPITOR) 10 MG tablet Take 1 tablet (10 mg total) by mouth daily. 05/09/19 08/07/19  Burtis Junes, NP  Cholecalciferol (VITAMIN D3 PO) Take 1 tablet by mouth daily.    [provider]  dofetilide (TIKOSYN) 125 MCG capsule Take 3 capsules (375 mcg total) by mouth 2 (two) times daily. 04/06/20   Belva Crome, MD  fluticasone (FLONASE) 50 MCG/ACT nasal spray Place 1 spray into both nostrils daily as needed for allergies or rhinitis. 05/19/19   [provider]  furosemide (LASIX) 20 MG tablet Take 1 tablet by mouth once daily 09/19/19   Burtis Junes, NP  magnesium oxide (MAG-OX) 400 MG tablet Take 1 tablet (400 mg total) by mouth every morning. 09/21/19   Burtis Junes, NP  metFORMIN (GLUCOPHAGE-XR) 500 MG 24 hr tablet Take 500 mg by mouth at bedtime. 08/02/17   [provider]  metoprolol succinate (TOPROL XL) 25 MG 24 hr tablet Take 0.5 tablets (12.5 mg total) by mouth daily. 10/12/19   Burtis Junes, NP  mirtazapine (REMERON) 15 MG tablet Take 15 mg by mouth daily.    [provider]  mirtazapine (REMERON) 30 MG tablet Take 30 mg by mouth at bedtime. 06/19/19   [provider]  Multiple Vitamin (MULTIVITAMIN WITH MINERALS) TABS tablet Take 1 tablet by mouth daily.    [provider]  NAMZARIC 28-10 MG CP24 Take 1 capsule by mouth at bedtime. 08/25/19   [provider]  nitroGLYCERIN (NITROSTAT) 0.4 MG SL tablet Place 1 tablet (0.4 mg total) under the tongue every 5 (five) minutes as needed for chest pain. 11/14/14   Dunn, Nedra Hai, PA-C  potassium chloride SA (KLOR-CON M20) 20 MEQ  tablet Take 1 tablet (20 mEq total) by mouth daily. 09/28/14   Dunn, Nedra Hai, PA-C  vitamin E 400 UNIT capsule Take 400 Units by mouth daily.    [provider]    Allergies    Tagamet [cimetidine] and Diovan [valsartan]  Review of Systems   Review of Systems  Unable to perform ROS: Dementia  Physical Exam Updated Vital Signs BP (!) 131/108 (BP Location: Right Arm)   Pulse (!) 56   Temp (!) 97.5 F (36.4 C) (Oral)   Resp 20   Ht 5\' 2"  (1.575 m)   Wt 54.4 kg   SpO2 99%   BMI 21.95 kg/m   Physical Exam Vitals and nursing note reviewed.  Constitutional:      General: She is not in acute distress.    Appearance: She is well-developed.  HENT:     Head: Normocephalic and atraumatic.     Mouth/Throat:     Pharynx: No oropharyngeal exudate.  Eyes:     Conjunctiva/sclera: Conjunctivae normal.     Pupils: Pupils are equal, round, and reactive to light.  Neck:     Comments: No midline C-spine Cardiovascular:     Rate and Rhythm: Normal rate and regular rhythm.     Heart sounds: Normal heart sounds. No murmur heard.   Pulmonary:     Effort: Pulmonary effort is normal. No respiratory distress.     Breath sounds: Normal breath sounds.  Abdominal:     Palpations: Abdomen is soft.     Tenderness: There is no abdominal tenderness. There is no guarding or rebound.  Musculoskeletal:        General: No tenderness. Normal range of motion.     Cervical back: Normal range of motion.     Comments: No T or L-spine pain. Full range of motion of hips without pain bilaterally.  2 Centimeter laceration to the left buttock as depicted.  Skin:    General: Skin is warm.  Neurological:     Mental Status: She is alert.     Cranial Nerves: No cranial nerve deficit.     Motor: No abnormal muscle tone.     Coordination: Coordination normal.     Comments: Oriented to person and place.  Moves all extremities equally to command  Psychiatric:        Behavior: Behavior normal.           ED Results / Procedures / Treatments   Labs (all labs ordered are listed, but only abnormal results are displayed) Labs Reviewed  CBC WITH DIFFERENTIAL/PLATELET - Abnormal; Notable for the following components:      Result Value   Hemoglobin 15.7 (*)    HCT 47.6 (*)    nRBC 0.5 (*)    All other components within normal limits  BASIC METABOLIC PANEL - Abnormal; Notable for the following components:   Chloride 114 (*)    CO2 17 (*)    Glucose, Bld 121 (*)    BUN 30 (*)    Creatinine, Ser 1.32 (*)    GFR, Estimated 40 (*)    All other components within normal limits  I-STAT CHEM 8, ED - Abnormal; Notable for the following components:   Chloride 120 (*)    BUN 32 (*)    Creatinine, Ser 1.20 (*)    Glucose, Bld 117 (*)    Calcium, Ion 0.94 (*)    TCO2 21 (*)    All other components within normal limits  TROPONIN I (HIGH SENSITIVITY) - Abnormal; Notable for the following components:   Troponin I (High Sensitivity) 49 (*)    All other components within normal limits  TROPONIN I (HIGH SENSITIVITY) - Abnormal; Notable for the following components:   Troponin I (High Sensitivity) 49 (*)    All other components within normal limits    EKG EKG Interpretation  Date/Time:  Saturday April 07 2020 23:26:33 EDT Ventricular Rate:  45 PR Interval:  226 QRS Duration: 85 QT Interval:  486 QTC Calculation: 421 R Axis:   99 Text Interpretation: Sinus bradycardia Borderline prolonged PR interval Right axis deviation Low voltage, precordial leads Anteroseptal infarct, old No significant change was found No significant change was found Confirmed by Ezequiel Essex [phone]) on 04/07/2020 11:31:11 PM   Radiology CT Head Wo Contrast  Result Date: 04/08/2020 CLINICAL DATA:  85 year old female status post fall, knocked over and fell on a glass lamp with subsequent lacerations. EXAM: CT HEAD WITHOUT CONTRAST TECHNIQUE: Contiguous axial images were obtained from the base of the skull  through the vertex without intravenous contrast. COMPARISON:  Brain MRI 01/08/2020.  Head CT 01/09/2020. FINDINGS: Brain: Stable cerebral volume. Stable vents. No midline shift, ventriculomegaly, mass effect, evidence of mass lesion, intracranial hemorrhage or evidence of cortically based acute infarction. Patchy bilateral white matter hypodensity. Stable gray-white matter differentiation throughout the brain. Vascular: Calcified atherosclerosis at the skull base. Skull: No acute osseous abnormality identified. Sinuses/Orbits: Visualized paranasal sinuses and mastoids are clear. Other: No orbit or scalp soft tissue injury identified. IMPRESSION: No acute intracranial abnormality or acute traumatic injury identified. Electronically Signed   By: Genevie Ann M.D.   On: 04/08/2020 05:35   CT Cervical Spine Wo Contrast  Result Date: 04/08/2020 CLINICAL DATA:  85 year old female status post fall, knocked over and fell on a glass lamp with subsequent lacerations. EXAM: CT CERVICAL SPINE WITHOUT CONTRAST TECHNIQUE: Multidetector CT imaging of the cervical spine was performed without intravenous contrast. Multiplanar CT image reconstructions were also generated. COMPARISON:  Head CT today reported separately. CTA neck 09/17/2013. FINDINGS: Alignment: Chronic reversal of cervical lordosis and mild multilevel spondylolisthesis are stable since 2015. Cervicothoracic junction alignment is within normal limits. Bilateral posterior element alignment is within normal limits. Skull base and vertebrae: Visualized skull base is intact. No atlanto-occipital dissociation. No acute osseous abnormality identified. Soft tissues and spinal canal: No prevertebral fluid or swelling. No visible canal hematoma. Calcified carotid atherosclerosis in the neck greater on the left. Disc levels: Multilevel chronic severe facet arthropathy in the setting of multilevel mild spondylolisthesis. Multilevel disc and endplate degeneration. Multilevel mild  spinal stenosis suspected. Upper chest: Negative. Other: Motion artifact at the mandible. IMPRESSION: 1. No acute traumatic injury identified in the cervical spine. 2. Chronically advanced cervical spine degeneration appears not significantly changed from 2015. 3. Chronic carotid atherosclerosis. Electronically Signed   By: Genevie Ann M.D.   On: 04/08/2020 05:38   CT ABDOMEN PELVIS W CONTRAST  Result Date: 04/08/2020 CLINICAL DATA:  Fall with left buttock laceration.  Dementia. EXAM: CT ABDOMEN AND PELVIS WITH CONTRAST TECHNIQUE: Multidetector CT imaging of the abdomen and pelvis was performed using the standard protocol following bolus administration of intravenous contrast. CONTRAST:  167mL OMNIPAQUE IOHEXOL 300 MG/ML  SOLN COMPARISON:  None. FINDINGS: Lower chest: No significant pulmonary nodules or acute consolidative airspace disease. Hepatobiliary: Normal liver size. No liver mass. Normal gallbladder with no radiopaque cholelithiasis. No biliary ductal dilatation. Pancreas: Cystic appearing 1.7 cm pancreatic body mass (series 3/image 21). Mildly dilated (3 mm diameter) main pancreatic duct. Spleen: Normal size. No mass. Adrenals/Urinary Tract: Normal adrenals. No hydronephrosis. Simple 1.8 cm posterior upper right renal cyst. Subcentimeter hypodense upper left renal cortical lesion is too small to characterize and requires no follow-up. Mildly distended and otherwise normal bladder. Stomach/Bowel: Small hiatal hernia. Otherwise normal nondistended stomach. Normal caliber small bowel with no small bowel wall  thickening. Normal appendix. Moderate sigmoid diverticulosis with no large bowel wall thickening or significant pericolonic fat stranding. Vascular/Lymphatic: Atherosclerotic nonaneurysmal abdominal aorta. Patent portal, splenic, hepatic and renal veins. No pathologically enlarged lymph nodes in the abdomen or pelvis. Reproductive: Coarsely calcified subcentimeter anterior uterine degenerated fibroid. No  adnexal masses. Other: No pneumoperitoneum, ascites or focal fluid collection. Musculoskeletal: No aggressive appearing focal osseous lesions. No fractures. Marked degenerative changes throughout the visualized thoracolumbar spine. IMPRESSION: 1. No acute traumatic injury in the abdomen or pelvis. 2. Cystic appearing 1.7 cm pancreatic body mass. Mildly dilated main pancreatic duct. Recommend further characterization with MRI abdomen without and with IV contrast on a short term outpatient basis. No biliary ductal dilatation. 3. Small hiatal hernia. 4. Moderate sigmoid diverticulosis. 5. Aortic Atherosclerosis (ICD10-I70.0). Electronically Signed   By: Ilona Sorrel M.D.   On: 04/08/2020 05:37   DG Pelvis Portable  Result Date: 04/07/2020 CLINICAL DATA:  Fall, evaluate for foreign body EXAM: PORTABLE PELVIS 1-2 VIEWS COMPARISON:  07/31/2011 FINDINGS: Extensive soft tissue and vascular calcifications, stable since prior study. No radiopaque foreign body. No acute bony abnormality. Specifically, no fracture, subluxation, or dislocation. Symmetric degenerative changes in the hips. IMPRESSION: No fracture or foreign body. Electronically Signed   By: Rolm Baptise M.D.   On: 04/07/2020 23:40   DG Chest Portable 1 View  Result Date: 04/07/2020 CLINICAL DATA:  Fall.  Evaluate for foreign body/glass EXAM: PORTABLE CHEST 1 VIEW COMPARISON:  07/01/2017 FINDINGS: Heart is borderline in size. Lungs clear. No effusions or pneumothorax. Aortic atherosclerosis. No radiopaque foreign body. No acute bony abnormality. IMPRESSION: Mild cardiomegaly.  No active disease. Electronically Signed   By: Rolm Baptise M.D.   On: 04/07/2020 23:41    Procedures Procedures   Medications Ordered in ED Medications  lidocaine-EPINEPHrine (XYLOCAINE W/EPI) 2 %-1:200000 (PF) injection 20 mL (has no administration in time range)  Tdap (BOOSTRIX) injection 0.5 mL (0.5 mLs Intramuscular Given 04/07/20 2325)    ED Course  I have reviewed the  triage vital signs and the nursing notes.  Pertinent labs & imaging results that were available during my care of the patient were reviewed by me and considered in my medical decision making (see chart for details).    MDM Rules/Calculators/A&P                         Fall with laceration to her buttocks from a broken lamp.  Unknown head trauma but she does take anticoagulation. She moves all extremities and appears to be at her baseline mental status.  Tetanus updated. X-ray will be obtained to evaluate for foreign body.  X-rays are negative for fracture or foreign body.  CT head and C-spine are negative. Abdominal CT shows no foreign bodies or broken bones but does show pancreatic cyst.  Discussed with patient's husband by phone need for follow-up regarding this need for MRI.  Follow up with PCP for suture removal in 1 week. Followup for MRI of pancreas as well.  Will attempt ambulation trial.   Final Clinical Impression(s) / ED Diagnoses Final diagnoses:  Fall, initial encounter  Laceration of right buttock, initial encounter  Pancreatic cyst    Rx / DC Orders ED Discharge Orders    None       Liane Tribbey, Annie Main, MD 04/08/20 [phone]

## 2020-04-07 NOTE — ED Triage Notes (Addendum)
Pt BIB EMS from home for fall. Knocked over a glass lamp and landed on glass. Lacerations to L buttock.   Hx dementia  EMS vitals: CBG 247 132/68 Pulse 62 98% RA RR Within normal limits

## 2020-04-08 ENCOUNTER — Emergency Department (HOSPITAL_COMMUNITY): Payer: Medicare Other

## 2020-04-08 DIAGNOSIS — S31811A Laceration without foreign body of right buttock, initial encounter: Secondary | ICD-10-CM | POA: Diagnosis not present

## 2020-04-08 LAB — CBC WITH DIFFERENTIAL/PLATELET
Abs Immature Granulocytes: 0.04 10*3/uL (ref 0.00–0.07)
Basophils Absolute: 0 10*3/uL (ref 0.0–0.1)
Basophils Relative: 0 %
Eosinophils Absolute: 0.1 10*3/uL (ref 0.0–0.5)
Eosinophils Relative: 1 %
HCT: 47.6 % — ABNORMAL HIGH (ref 36.0–46.0)
Hemoglobin: 15.7 g/dL — ABNORMAL HIGH (ref 12.0–15.0)
Immature Granulocytes: 1 %
Lymphocytes Relative: 24 %
Lymphs Abs: 1.9 10*3/uL (ref 0.7–4.0)
MCH: 32.2 pg (ref 26.0–34.0)
MCHC: 33 g/dL (ref 30.0–36.0)
MCV: 97.7 fL (ref 80.0–100.0)
Monocytes Absolute: 0.7 10*3/uL (ref 0.1–1.0)
Monocytes Relative: 9 %
Neutro Abs: 5.2 10*3/uL (ref 1.7–7.7)
Neutrophils Relative %: 65 %
Platelets: 238 10*3/uL (ref 150–400)
RBC: 4.87 MIL/uL (ref 3.87–5.11)
RDW: 13.9 % (ref 11.5–15.5)
WBC: 8 10*3/uL (ref 4.0–10.5)
nRBC: 0.5 % — ABNORMAL HIGH (ref 0.0–0.2)

## 2020-04-08 LAB — I-STAT CHEM 8, ED
BUN: 32 mg/dL — ABNORMAL HIGH (ref 8–23)
Calcium, Ion: 0.94 mmol/L — ABNORMAL LOW (ref 1.15–1.40)
Chloride: 120 mmol/L — ABNORMAL HIGH (ref 98–111)
Creatinine, Ser: 1.2 mg/dL — ABNORMAL HIGH (ref 0.44–1.00)
Glucose, Bld: 117 mg/dL — ABNORMAL HIGH (ref 70–99)
HCT: 41 % (ref 36.0–46.0)
Hemoglobin: 13.9 g/dL (ref 12.0–15.0)
Potassium: 4.3 mmol/L (ref 3.5–5.1)
Sodium: 141 mmol/L (ref 135–145)
TCO2: 21 mmol/L — ABNORMAL LOW (ref 22–32)

## 2020-04-08 LAB — BASIC METABOLIC PANEL
Anion gap: 13 (ref 5–15)
BUN: 30 mg/dL — ABNORMAL HIGH (ref 8–23)
CO2: 17 mmol/L — ABNORMAL LOW (ref 22–32)
Calcium: 10.2 mg/dL (ref 8.9–10.3)
Chloride: 114 mmol/L — ABNORMAL HIGH (ref 98–111)
Creatinine, Ser: 1.32 mg/dL — ABNORMAL HIGH (ref 0.44–1.00)
GFR, Estimated: 40 mL/min — ABNORMAL LOW (ref >60)
Glucose, Bld: 121 mg/dL — ABNORMAL HIGH (ref 70–99)
Potassium: 4.3 mmol/L (ref 3.5–5.1)
Sodium: 144 mmol/L (ref 135–145)

## 2020-04-08 LAB — TROPONIN I (HIGH SENSITIVITY)
Troponin I (High Sensitivity): 49 ng/L — ABNORMAL HIGH (ref <18)
Troponin I (High Sensitivity): 49 ng/L — ABNORMAL HIGH (ref <18)

## 2020-04-08 MED ORDER — IOHEXOL 300 MG/ML  SOLN
100.0000 mL | Freq: Once | INTRAMUSCULAR | Status: AC | PRN
  Administered 2020-04-08: 100 mL via INTRAVENOUS

## 2020-04-08 NOTE — ED Notes (Signed)
Daughter came to pick up pt. DC instructions given to daughter.

## 2020-04-08 NOTE — ED Notes (Signed)
Dr. Wyvonnia Dusky told this RN that he spoke with pt's husband, who told Dr. Wyvonnia Dusky that someone will be sent to pick up pt around 0800.

## 2020-04-08 NOTE — Discharge Instructions (Addendum)
Follow-up with your doctor for suture removal in 1 week.  Your CT scan today showed a cyst in your pancreas and this needs further follow-up with an MRI for further characterization. Return to the ED with new or worsening symptoms

## 2020-04-08 NOTE — ED Notes (Signed)
Pt was unable to walk. Pt stood up and feel backwards towards the bed. Pt is unsteady on feet and need x2 assist.

## 2020-04-08 NOTE — ED Notes (Signed)
Daughter is coming to pick up pt.

## 2020-04-08 NOTE — ED Notes (Signed)
IV was in bed when tech went to ambulate pt.

## 2020-04-08 NOTE — ED Notes (Signed)
Patient transported to CT 

## 2020-04-08 NOTE — ED Notes (Signed)
Spoke to pt husband at this time. Pt husband states he does not drive and daughter is coming to pick up pt. Daughter is coming from [place]. ETA of 0900-1000 AM per husband. Pt has dementia and unable to stay in waiting room. Easy to redirect but pt is not oriented to situation and place.

## 2020-04-08 NOTE — Progress Notes (Signed)
..Laceration Repair  Date/Time: 04/08/2020 5:25 PM Performed by: Tedd Sias, PA Authorized by: Tedd Sias, PA   Consent:    Consent obtained:  Verbal   Consent given by:  Patient   Risks discussed:  Infection, need for additional repair, pain, poor cosmetic result and poor wound healing   Alternatives discussed:  No treatment and delayed treatment Universal protocol:    Procedure explained and questions answered to patient or proxy's satisfaction: yes     Relevant documents present and verified: yes     Test results available: yes     Imaging studies available: yes     Required blood products, implants, devices, and special equipment available: yes     Site/side marked: yes     Immediately prior to procedure, a time out was called: yes     Patient identity confirmed:  Verbally with patient Anesthesia:    Anesthesia method:  Local infiltration   Local anesthetic:  Lidocaine 2% WITH epi Laceration details:    Location:  Pelvis   Pelvis location:  L buttock   Length (cm):  2 Exploration:    Hemostasis achieved with:  Direct pressure   Imaging obtained: x-ray     Imaging outcome: foreign body not noted     Wound extent: no foreign bodies/material noted and no tendon damage noted     Contaminated: no   Treatment:    Area cleansed with:  Saline   Amount of cleaning:  Standard   Irrigation solution:  Sterile saline   Irrigation volume:  100 cc   Irrigation method:  Pressure wash   Visualized foreign bodies/material removed: no   Skin repair:    Repair method:  Sutures   Suture size:  3-0   Suture material:  Prolene   Suture technique:  Horizontal mattress   Number of sutures:  1 Approximation:    Approximation:  Close Repair type:    Repair type:  Simple Post-procedure details:    Dressing:  Antibiotic ointment and non-adherent dressing   Procedure completion:  Tolerated well, no immediate complications .Marland KitchenLaceration Repair  Date/Time: 04/08/2020 5:26  PM Performed by: Tedd Sias, PA Authorized by: Tedd Sias, PA   Consent:    Consent obtained:  Verbal   Consent given by:  Patient   Risks discussed:  Infection, need for additional repair, pain, poor cosmetic result and poor wound healing   Alternatives discussed:  No treatment and delayed treatment Universal protocol:    Procedure explained and questions answered to patient or proxy's satisfaction: yes     Relevant documents present and verified: yes     Test results available: yes     Imaging studies available: yes     Required blood products, implants, devices, and special equipment available: yes     Site/side marked: yes     Immediately prior to procedure, a time out was called: yes     Patient identity confirmed:  Verbally with patient Anesthesia:    Anesthesia method:  Local infiltration Laceration details:    Location:  Pelvis   Pelvis location:  L buttock   Length (cm):  1 Exploration:    Hemostasis achieved with:  Direct pressure   Wound extent: no foreign bodies/material noted and no tendon damage noted     Contaminated: no   Treatment:    Area cleansed with:  Saline   Amount of cleaning:  Standard   Irrigation solution:  Sterile saline   Irrigation method:  Pressure wash   Visualized foreign bodies/material removed: no   Skin repair:    Repair method:  Sutures   Suture size:  3-0   Suture material:  Prolene   Suture technique:  Simple interrupted   Number of sutures:  1 Approximation:    Approximation:  Close Post-procedure details:    Dressing:  Antibiotic ointment and non-adherent dressing   Procedure completion:  Tolerated well, no immediate complications   2 stitches placed small lacerations of left gluteus

## 2020-04-09 ENCOUNTER — Other Ambulatory Visit: Payer: Self-pay

## 2020-04-09 DIAGNOSIS — G309 Alzheimer's disease, unspecified: Secondary | ICD-10-CM | POA: Diagnosis not present

## 2020-04-09 DIAGNOSIS — I5032 Chronic diastolic (congestive) heart failure: Secondary | ICD-10-CM | POA: Diagnosis not present

## 2020-04-09 DIAGNOSIS — F0280 Dementia in other diseases classified elsewhere without behavioral disturbance: Secondary | ICD-10-CM | POA: Diagnosis not present

## 2020-04-09 DIAGNOSIS — I672 Cerebral atherosclerosis: Secondary | ICD-10-CM | POA: Diagnosis not present

## 2020-04-09 DIAGNOSIS — I130 Hypertensive heart and chronic kidney disease with heart failure and stage 1 through stage 4 chronic kidney disease, or unspecified chronic kidney disease: Secondary | ICD-10-CM | POA: Diagnosis not present

## 2020-04-09 DIAGNOSIS — N183 Chronic kidney disease, stage 3 unspecified: Secondary | ICD-10-CM | POA: Diagnosis not present

## 2020-04-09 MED ORDER — MAGNESIUM OXIDE 400 MG PO TABS: 1.0000 | ORAL_TABLET | Freq: Every morning | ORAL | 3 refills | Status: AC

## 2020-04-10 DIAGNOSIS — N183 Chronic kidney disease, stage 3 unspecified: Secondary | ICD-10-CM | POA: Diagnosis not present

## 2020-04-10 DIAGNOSIS — I5032 Chronic diastolic (congestive) heart failure: Secondary | ICD-10-CM | POA: Diagnosis not present

## 2020-04-10 DIAGNOSIS — G309 Alzheimer's disease, unspecified: Secondary | ICD-10-CM | POA: Diagnosis not present

## 2020-04-10 DIAGNOSIS — I130 Hypertensive heart and chronic kidney disease with heart failure and stage 1 through stage 4 chronic kidney disease, or unspecified chronic kidney disease: Secondary | ICD-10-CM | POA: Diagnosis not present

## 2020-04-10 DIAGNOSIS — F0280 Dementia in other diseases classified elsewhere without behavioral disturbance: Secondary | ICD-10-CM | POA: Diagnosis not present

## 2020-04-10 DIAGNOSIS — I672 Cerebral atherosclerosis: Secondary | ICD-10-CM | POA: Diagnosis not present

## 2020-04-12 ENCOUNTER — Encounter: Payer: Self-pay | Admitting: Sports Medicine

## 2020-04-12 ENCOUNTER — Ambulatory Visit (INDEPENDENT_AMBULATORY_CARE_PROVIDER_SITE_OTHER): Payer: Medicare Other | Admitting: Sports Medicine

## 2020-04-12 ENCOUNTER — Other Ambulatory Visit: Payer: Self-pay

## 2020-04-12 DIAGNOSIS — L84 Corns and callosities: Secondary | ICD-10-CM | POA: Diagnosis not present

## 2020-04-12 DIAGNOSIS — M79672 Pain in left foot: Secondary | ICD-10-CM | POA: Diagnosis not present

## 2020-04-12 DIAGNOSIS — I130 Hypertensive heart and chronic kidney disease with heart failure and stage 1 through stage 4 chronic kidney disease, or unspecified chronic kidney disease: Secondary | ICD-10-CM | POA: Diagnosis not present

## 2020-04-12 DIAGNOSIS — M79671 Pain in right foot: Secondary | ICD-10-CM

## 2020-04-12 DIAGNOSIS — N183 Chronic kidney disease, stage 3 unspecified: Secondary | ICD-10-CM | POA: Diagnosis not present

## 2020-04-12 DIAGNOSIS — I672 Cerebral atherosclerosis: Secondary | ICD-10-CM | POA: Diagnosis not present

## 2020-04-12 DIAGNOSIS — F0280 Dementia in other diseases classified elsewhere without behavioral disturbance: Secondary | ICD-10-CM | POA: Diagnosis not present

## 2020-04-12 DIAGNOSIS — G459 Transient cerebral ischemic attack, unspecified: Secondary | ICD-10-CM

## 2020-04-12 DIAGNOSIS — I5032 Chronic diastolic (congestive) heart failure: Secondary | ICD-10-CM | POA: Diagnosis not present

## 2020-04-12 DIAGNOSIS — G309 Alzheimer's disease, unspecified: Secondary | ICD-10-CM

## 2020-04-12 NOTE — Progress Notes (Signed)
Subjective: Donna [name] is a 85 y.o. female patient who presents to office for evaluation of foot pain secondary to callus skin. Patient is assisted by husband who reports her heels are dry.  Patient denies any other pedal complaints.   Patient Active Problem List   Diagnosis Date Noted  . TIA (transient ischemic attack) 01/08/2020  . Alzheimer disease ([place]) 06/27/2019  . Headache 12/21/2018  . Chronic anticoagulation 02/22/2015  . Chronic atrial fibrillation ([place]) 11/19/2014  . Chronic diastolic HF (heart failure) ([place]) 11/19/2014  . CKD (chronic kidney disease), stage III ([place]) 11/19/2014  . Fibromuscular dysplasia ([place])   . Carotid arterial disease ([place])   . Hypertension 05/28/2011  . Menopause 05/28/2011  . Atherosclerotic RAS (renal artery stenosis), bilateral ([place]) 06/28/2010    Current Outpatient Medications on File Prior to Visit  Medication Sig Dispense Refill  . amLODipine (NORVASC) 10 MG tablet Take 1 tablet by mouth once daily (Patient taking differently: Take 10 mg by mouth daily.) 90 tablet 0  . apixaban (ELIQUIS) 2.5 MG TABS tablet Take 1 tablet (2.5 mg total) by mouth 2 (two) times daily. 60 tablet 6  . atorvastatin (LIPITOR) 10 MG tablet Take 1 tablet (10 mg total) by mouth daily. 90 tablet 3  . Cholecalciferol (VITAMIN D3 PO) Take 1 tablet by mouth daily.    Marland Kitchen dofetilide (TIKOSYN) 125 MCG capsule Take 3 capsules (375 mcg total) by mouth 2 (two) times daily. 540 capsule 2  . fluticasone (FLONASE) 50 MCG/ACT nasal spray Place 1 spray into both nostrils daily as needed for allergies or rhinitis.    . furosemide (LASIX) 20 MG tablet Take 1 tablet by mouth once daily (Patient taking differently: Take 20 mg by mouth daily.) 90 tablet 3  . magnesium oxide (MAG-OX) 400 MG tablet Take 1 tablet (400 mg total) by mouth every morning. 90 tablet 3  . metFORMIN (GLUCOPHAGE-XR) 500 MG 24 hr tablet Take 500 mg by mouth at bedtime.  6  . metoprolol succinate (TOPROL XL) 25 MG 24 hr  tablet Take 0.5 tablets (12.5 mg total) by mouth daily. 90 tablet 3  . mirtazapine (REMERON) 30 MG tablet Take 30 mg by mouth at bedtime.    . Multiple Vitamin (MULTIVITAMIN WITH MINERALS) TABS tablet Take 1 tablet by mouth daily.    Marland Kitchen NAMZARIC 28-10 MG CP24 Take 1 capsule by mouth at bedtime.    . nitroGLYCERIN (NITROSTAT) 0.4 MG SL tablet Place 1 tablet (0.4 mg total) under the tongue every 5 (five) minutes as needed for chest pain. 25 tablet 3  . potassium chloride SA (KLOR-CON M20) 20 MEQ tablet Take 1 tablet (20 mEq total) by mouth daily. 90 tablet 3  . vitamin E 400 UNIT capsule Take 400 Units by mouth daily.     No current facility-administered medications on file prior to visit.    Allergies  Allergen Reactions  . Tagamet [Cimetidine] Hives  . Diovan [Valsartan] Other (See Comments)    Hair loss    Objective:  General: Alert and oriented x2 in no acute distress  Dermatology: Keratotic lesion present medial heels bilateral with skin lines transversing the lesions, pain is present with direct pressure to the lesions with a central nucleated core noted, no webspace macerations, no ecchymosis bilateral, all nails x 10 are well manicured.  Vascular: Dorsalis Pedis and Posterior Tibial pedal pulses 1/4, Capillary Fill Time 5 seconds, + pedal hair growth bilateral, no edema bilateral lower extremities, Temperature gradient within normal limits.  Neurology:  Gross sensation intact via light touch bilateral.  Musculoskeletal: Minimal tenderness with palpation at the keratotic lesion sites on medial heels bilateal, Muscular strength 3/5 in all groups without pain or limitation on range of motion.  Assessment and Plan: Problem List Items Addressed This Visit      Cardiovascular and Mediastinum   TIA (transient ischemic attack)     Nervous and Auditory   Alzheimer disease ([place])    Other Visit Diagnoses    Callus    -  Primary   Foot pain, bilateral          -Complete  examination performed -Discussed treatment options with husband  -Parred keratoic lesions x 2 using a chisel blade -Encouraged daily skin emollients; sample of foot miracle cream provided for the aide to put on heels -Encouraged use pillow in between legs to prevent rubbing of heels -Patient to return to office as needed or sooner if condition worsens.  Landis Martins, DPM

## 2020-04-13 DIAGNOSIS — G309 Alzheimer's disease, unspecified: Secondary | ICD-10-CM | POA: Diagnosis not present

## 2020-04-13 DIAGNOSIS — I672 Cerebral atherosclerosis: Secondary | ICD-10-CM | POA: Diagnosis not present

## 2020-04-13 DIAGNOSIS — F0280 Dementia in other diseases classified elsewhere without behavioral disturbance: Secondary | ICD-10-CM | POA: Diagnosis not present

## 2020-04-13 DIAGNOSIS — I130 Hypertensive heart and chronic kidney disease with heart failure and stage 1 through stage 4 chronic kidney disease, or unspecified chronic kidney disease: Secondary | ICD-10-CM | POA: Diagnosis not present

## 2020-04-13 DIAGNOSIS — I5032 Chronic diastolic (congestive) heart failure: Secondary | ICD-10-CM | POA: Diagnosis not present

## 2020-04-13 DIAGNOSIS — N183 Chronic kidney disease, stage 3 unspecified: Secondary | ICD-10-CM | POA: Diagnosis not present

## 2020-04-16 DIAGNOSIS — F0280 Dementia in other diseases classified elsewhere without behavioral disturbance: Secondary | ICD-10-CM | POA: Diagnosis not present

## 2020-04-16 DIAGNOSIS — I5032 Chronic diastolic (congestive) heart failure: Secondary | ICD-10-CM | POA: Diagnosis not present

## 2020-04-16 DIAGNOSIS — G309 Alzheimer's disease, unspecified: Secondary | ICD-10-CM | POA: Diagnosis not present

## 2020-04-16 DIAGNOSIS — N183 Chronic kidney disease, stage 3 unspecified: Secondary | ICD-10-CM | POA: Diagnosis not present

## 2020-04-16 DIAGNOSIS — I130 Hypertensive heart and chronic kidney disease with heart failure and stage 1 through stage 4 chronic kidney disease, or unspecified chronic kidney disease: Secondary | ICD-10-CM | POA: Diagnosis not present

## 2020-04-16 DIAGNOSIS — I672 Cerebral atherosclerosis: Secondary | ICD-10-CM | POA: Diagnosis not present

## 2020-04-17 DIAGNOSIS — I130 Hypertensive heart and chronic kidney disease with heart failure and stage 1 through stage 4 chronic kidney disease, or unspecified chronic kidney disease: Secondary | ICD-10-CM | POA: Diagnosis not present

## 2020-04-17 DIAGNOSIS — N183 Chronic kidney disease, stage 3 unspecified: Secondary | ICD-10-CM | POA: Diagnosis not present

## 2020-04-17 DIAGNOSIS — I5032 Chronic diastolic (congestive) heart failure: Secondary | ICD-10-CM | POA: Diagnosis not present

## 2020-04-17 DIAGNOSIS — I672 Cerebral atherosclerosis: Secondary | ICD-10-CM | POA: Diagnosis not present

## 2020-04-17 DIAGNOSIS — G309 Alzheimer's disease, unspecified: Secondary | ICD-10-CM | POA: Diagnosis not present

## 2020-04-17 DIAGNOSIS — F0280 Dementia in other diseases classified elsewhere without behavioral disturbance: Secondary | ICD-10-CM | POA: Diagnosis not present

## 2020-04-17 DIAGNOSIS — S71112A Laceration without foreign body, left thigh, initial encounter: Secondary | ICD-10-CM | POA: Diagnosis not present

## 2020-04-18 ENCOUNTER — Telehealth: Payer: Self-pay | Admitting: Interventional Cardiology

## 2020-04-18 DIAGNOSIS — F0390 Unspecified dementia without behavioral disturbance: Secondary | ICD-10-CM | POA: Diagnosis not present

## 2020-04-18 DIAGNOSIS — F341 Dysthymic disorder: Secondary | ICD-10-CM | POA: Diagnosis not present

## 2020-04-18 DIAGNOSIS — N183 Chronic kidney disease, stage 3 unspecified: Secondary | ICD-10-CM | POA: Diagnosis not present

## 2020-04-18 DIAGNOSIS — I672 Cerebral atherosclerosis: Secondary | ICD-10-CM | POA: Diagnosis not present

## 2020-04-18 DIAGNOSIS — I701 Atherosclerosis of renal artery: Secondary | ICD-10-CM | POA: Diagnosis not present

## 2020-04-18 DIAGNOSIS — E119 Type 2 diabetes mellitus without complications: Secondary | ICD-10-CM | POA: Diagnosis not present

## 2020-04-18 DIAGNOSIS — I5032 Chronic diastolic (congestive) heart failure: Secondary | ICD-10-CM | POA: Diagnosis not present

## 2020-04-18 DIAGNOSIS — E782 Mixed hyperlipidemia: Secondary | ICD-10-CM | POA: Diagnosis not present

## 2020-04-18 DIAGNOSIS — I10 Essential (primary) hypertension: Secondary | ICD-10-CM | POA: Diagnosis not present

## 2020-04-18 DIAGNOSIS — G309 Alzheimer's disease, unspecified: Secondary | ICD-10-CM | POA: Diagnosis not present

## 2020-04-18 DIAGNOSIS — I130 Hypertensive heart and chronic kidney disease with heart failure and stage 1 through stage 4 chronic kidney disease, or unspecified chronic kidney disease: Secondary | ICD-10-CM | POA: Diagnosis not present

## 2020-04-18 DIAGNOSIS — I480 Paroxysmal atrial fibrillation: Secondary | ICD-10-CM | POA: Diagnosis not present

## 2020-04-18 DIAGNOSIS — I509 Heart failure, unspecified: Secondary | ICD-10-CM | POA: Diagnosis not present

## 2020-04-18 DIAGNOSIS — F0280 Dementia in other diseases classified elsewhere without behavioral disturbance: Secondary | ICD-10-CM | POA: Diagnosis not present

## 2020-04-18 DIAGNOSIS — G459 Transient cerebral ischemic attack, unspecified: Secondary | ICD-10-CM | POA: Diagnosis not present

## 2020-04-18 NOTE — Telephone Encounter (Signed)
Spoke with [name] and made him aware that pt was taking at last appt and no changes to Atorvastatin were made.  Advised to continue if tolerated.  Donna [name] appreciative for call.

## 2020-04-18 NOTE — Telephone Encounter (Signed)
S/w with pharmacist Alis at [facility],  [place] pt's insurance again and Phyllis Ginger was free with insurance plan. Called pt's spouse and let know that medication was free and ready for pick.  Will also send to Maude Leriche to let know.

## 2020-04-18 NOTE — Telephone Encounter (Signed)
New Message:      [name], from [facility] called. He said he needs to know if pt is supposed to be taking Atorvastatin. The daughter said the [place] Doctor said the pt could stop taking it she wanted. The daughter felt like Dr Tamala Julian would need to make that decision, since he have been treating her for a long time.

## 2020-04-18 NOTE — Telephone Encounter (Signed)
Pt required a tier exception last year.  Will send to [place] nurse to see if we can do another one this year.

## 2020-04-18 NOTE — Telephone Encounter (Signed)
Pt c/o medication issue:  1. Name of Medication: dofetilide (TIKOSYN) 125 MCG capsule  2. How are you currently taking this medication (dosage and times per day)? As prescribed  3. Are you having a reaction (difficulty breathing--STAT)? No   4. What is your medication issue?   Patient's husband states the patient recently attempted to refill her medication and she was told it would cost around $1300. Unable to afford it, patient's husband would like to know if we are able to offer any assistance. He states this medication works really well for her and she does not want to switch to anything else. He requested to speak with Andee Poles Specifically.

## 2020-04-19 DIAGNOSIS — I672 Cerebral atherosclerosis: Secondary | ICD-10-CM | POA: Diagnosis not present

## 2020-04-19 DIAGNOSIS — I130 Hypertensive heart and chronic kidney disease with heart failure and stage 1 through stage 4 chronic kidney disease, or unspecified chronic kidney disease: Secondary | ICD-10-CM | POA: Diagnosis not present

## 2020-04-19 DIAGNOSIS — I5032 Chronic diastolic (congestive) heart failure: Secondary | ICD-10-CM | POA: Diagnosis not present

## 2020-04-19 DIAGNOSIS — F0280 Dementia in other diseases classified elsewhere without behavioral disturbance: Secondary | ICD-10-CM | POA: Diagnosis not present

## 2020-04-19 DIAGNOSIS — N183 Chronic kidney disease, stage 3 unspecified: Secondary | ICD-10-CM | POA: Diagnosis not present

## 2020-04-19 DIAGNOSIS — G309 Alzheimer's disease, unspecified: Secondary | ICD-10-CM | POA: Diagnosis not present

## 2020-04-20 DIAGNOSIS — F0280 Dementia in other diseases classified elsewhere without behavioral disturbance: Secondary | ICD-10-CM | POA: Diagnosis not present

## 2020-04-20 DIAGNOSIS — I672 Cerebral atherosclerosis: Secondary | ICD-10-CM | POA: Diagnosis not present

## 2020-04-20 DIAGNOSIS — G309 Alzheimer's disease, unspecified: Secondary | ICD-10-CM | POA: Diagnosis not present

## 2020-04-20 DIAGNOSIS — I130 Hypertensive heart and chronic kidney disease with heart failure and stage 1 through stage 4 chronic kidney disease, or unspecified chronic kidney disease: Secondary | ICD-10-CM | POA: Diagnosis not present

## 2020-04-20 DIAGNOSIS — N183 Chronic kidney disease, stage 3 unspecified: Secondary | ICD-10-CM | POA: Diagnosis not present

## 2020-04-20 DIAGNOSIS — I5032 Chronic diastolic (congestive) heart failure: Secondary | ICD-10-CM | POA: Diagnosis not present

## 2020-04-23 DIAGNOSIS — G309 Alzheimer's disease, unspecified: Secondary | ICD-10-CM | POA: Diagnosis not present

## 2020-04-23 DIAGNOSIS — I130 Hypertensive heart and chronic kidney disease with heart failure and stage 1 through stage 4 chronic kidney disease, or unspecified chronic kidney disease: Secondary | ICD-10-CM | POA: Diagnosis not present

## 2020-04-23 DIAGNOSIS — N183 Chronic kidney disease, stage 3 unspecified: Secondary | ICD-10-CM | POA: Diagnosis not present

## 2020-04-23 DIAGNOSIS — I672 Cerebral atherosclerosis: Secondary | ICD-10-CM | POA: Diagnosis not present

## 2020-04-23 DIAGNOSIS — F0280 Dementia in other diseases classified elsewhere without behavioral disturbance: Secondary | ICD-10-CM | POA: Diagnosis not present

## 2020-04-23 DIAGNOSIS — I5032 Chronic diastolic (congestive) heart failure: Secondary | ICD-10-CM | POA: Diagnosis not present

## 2020-04-24 DIAGNOSIS — F0280 Dementia in other diseases classified elsewhere without behavioral disturbance: Secondary | ICD-10-CM | POA: Diagnosis not present

## 2020-04-24 DIAGNOSIS — N183 Chronic kidney disease, stage 3 unspecified: Secondary | ICD-10-CM | POA: Diagnosis not present

## 2020-04-24 DIAGNOSIS — I672 Cerebral atherosclerosis: Secondary | ICD-10-CM | POA: Diagnosis not present

## 2020-04-24 DIAGNOSIS — G309 Alzheimer's disease, unspecified: Secondary | ICD-10-CM | POA: Diagnosis not present

## 2020-04-24 DIAGNOSIS — I5032 Chronic diastolic (congestive) heart failure: Secondary | ICD-10-CM | POA: Diagnosis not present

## 2020-04-24 DIAGNOSIS — I130 Hypertensive heart and chronic kidney disease with heart failure and stage 1 through stage 4 chronic kidney disease, or unspecified chronic kidney disease: Secondary | ICD-10-CM | POA: Diagnosis not present

## 2020-04-25 DIAGNOSIS — G309 Alzheimer's disease, unspecified: Secondary | ICD-10-CM | POA: Diagnosis not present

## 2020-04-25 DIAGNOSIS — N183 Chronic kidney disease, stage 3 unspecified: Secondary | ICD-10-CM | POA: Diagnosis not present

## 2020-04-25 DIAGNOSIS — F0280 Dementia in other diseases classified elsewhere without behavioral disturbance: Secondary | ICD-10-CM | POA: Diagnosis not present

## 2020-04-25 DIAGNOSIS — I5032 Chronic diastolic (congestive) heart failure: Secondary | ICD-10-CM | POA: Diagnosis not present

## 2020-04-25 DIAGNOSIS — I672 Cerebral atherosclerosis: Secondary | ICD-10-CM | POA: Diagnosis not present

## 2020-04-25 DIAGNOSIS — I130 Hypertensive heart and chronic kidney disease with heart failure and stage 1 through stage 4 chronic kidney disease, or unspecified chronic kidney disease: Secondary | ICD-10-CM | POA: Diagnosis not present

## 2020-04-26 DIAGNOSIS — I130 Hypertensive heart and chronic kidney disease with heart failure and stage 1 through stage 4 chronic kidney disease, or unspecified chronic kidney disease: Secondary | ICD-10-CM | POA: Diagnosis not present

## 2020-04-26 DIAGNOSIS — I672 Cerebral atherosclerosis: Secondary | ICD-10-CM | POA: Diagnosis not present

## 2020-04-26 DIAGNOSIS — I5032 Chronic diastolic (congestive) heart failure: Secondary | ICD-10-CM | POA: Diagnosis not present

## 2020-04-26 DIAGNOSIS — F0280 Dementia in other diseases classified elsewhere without behavioral disturbance: Secondary | ICD-10-CM | POA: Diagnosis not present

## 2020-04-26 DIAGNOSIS — G309 Alzheimer's disease, unspecified: Secondary | ICD-10-CM | POA: Diagnosis not present

## 2020-04-26 DIAGNOSIS — N183 Chronic kidney disease, stage 3 unspecified: Secondary | ICD-10-CM | POA: Diagnosis not present

## 2020-04-27 DIAGNOSIS — F0280 Dementia in other diseases classified elsewhere without behavioral disturbance: Secondary | ICD-10-CM | POA: Diagnosis not present

## 2020-04-27 DIAGNOSIS — G309 Alzheimer's disease, unspecified: Secondary | ICD-10-CM | POA: Diagnosis not present

## 2020-04-27 DIAGNOSIS — I672 Cerebral atherosclerosis: Secondary | ICD-10-CM | POA: Diagnosis not present

## 2020-04-27 DIAGNOSIS — I130 Hypertensive heart and chronic kidney disease with heart failure and stage 1 through stage 4 chronic kidney disease, or unspecified chronic kidney disease: Secondary | ICD-10-CM | POA: Diagnosis not present

## 2020-04-27 DIAGNOSIS — I5032 Chronic diastolic (congestive) heart failure: Secondary | ICD-10-CM | POA: Diagnosis not present

## 2020-04-27 DIAGNOSIS — N183 Chronic kidney disease, stage 3 unspecified: Secondary | ICD-10-CM | POA: Diagnosis not present

## 2020-04-30 DIAGNOSIS — I672 Cerebral atherosclerosis: Secondary | ICD-10-CM | POA: Diagnosis not present

## 2020-04-30 DIAGNOSIS — F0280 Dementia in other diseases classified elsewhere without behavioral disturbance: Secondary | ICD-10-CM | POA: Diagnosis not present

## 2020-04-30 DIAGNOSIS — G309 Alzheimer's disease, unspecified: Secondary | ICD-10-CM | POA: Diagnosis not present

## 2020-04-30 DIAGNOSIS — I5032 Chronic diastolic (congestive) heart failure: Secondary | ICD-10-CM | POA: Diagnosis not present

## 2020-04-30 DIAGNOSIS — I130 Hypertensive heart and chronic kidney disease with heart failure and stage 1 through stage 4 chronic kidney disease, or unspecified chronic kidney disease: Secondary | ICD-10-CM | POA: Diagnosis not present

## 2020-04-30 DIAGNOSIS — N183 Chronic kidney disease, stage 3 unspecified: Secondary | ICD-10-CM | POA: Diagnosis not present

## 2020-05-01 DIAGNOSIS — I672 Cerebral atherosclerosis: Secondary | ICD-10-CM | POA: Diagnosis not present

## 2020-05-01 DIAGNOSIS — F0280 Dementia in other diseases classified elsewhere without behavioral disturbance: Secondary | ICD-10-CM | POA: Diagnosis not present

## 2020-05-01 DIAGNOSIS — I5032 Chronic diastolic (congestive) heart failure: Secondary | ICD-10-CM | POA: Diagnosis not present

## 2020-05-01 DIAGNOSIS — G309 Alzheimer's disease, unspecified: Secondary | ICD-10-CM | POA: Diagnosis not present

## 2020-05-01 DIAGNOSIS — N183 Chronic kidney disease, stage 3 unspecified: Secondary | ICD-10-CM | POA: Diagnosis not present

## 2020-05-01 DIAGNOSIS — I130 Hypertensive heart and chronic kidney disease with heart failure and stage 1 through stage 4 chronic kidney disease, or unspecified chronic kidney disease: Secondary | ICD-10-CM | POA: Diagnosis not present

## 2020-05-02 DIAGNOSIS — I672 Cerebral atherosclerosis: Secondary | ICD-10-CM | POA: Diagnosis not present

## 2020-05-02 DIAGNOSIS — F0280 Dementia in other diseases classified elsewhere without behavioral disturbance: Secondary | ICD-10-CM | POA: Diagnosis not present

## 2020-05-02 DIAGNOSIS — G309 Alzheimer's disease, unspecified: Secondary | ICD-10-CM | POA: Diagnosis not present

## 2020-05-02 DIAGNOSIS — I130 Hypertensive heart and chronic kidney disease with heart failure and stage 1 through stage 4 chronic kidney disease, or unspecified chronic kidney disease: Secondary | ICD-10-CM | POA: Diagnosis not present

## 2020-05-02 DIAGNOSIS — N183 Chronic kidney disease, stage 3 unspecified: Secondary | ICD-10-CM | POA: Diagnosis not present

## 2020-05-02 DIAGNOSIS — I5032 Chronic diastolic (congestive) heart failure: Secondary | ICD-10-CM | POA: Diagnosis not present

## 2020-05-03 DIAGNOSIS — I672 Cerebral atherosclerosis: Secondary | ICD-10-CM | POA: Diagnosis not present

## 2020-05-03 DIAGNOSIS — G309 Alzheimer's disease, unspecified: Secondary | ICD-10-CM | POA: Diagnosis not present

## 2020-05-03 DIAGNOSIS — I130 Hypertensive heart and chronic kidney disease with heart failure and stage 1 through stage 4 chronic kidney disease, or unspecified chronic kidney disease: Secondary | ICD-10-CM | POA: Diagnosis not present

## 2020-05-03 DIAGNOSIS — N183 Chronic kidney disease, stage 3 unspecified: Secondary | ICD-10-CM | POA: Diagnosis not present

## 2020-05-03 DIAGNOSIS — F0280 Dementia in other diseases classified elsewhere without behavioral disturbance: Secondary | ICD-10-CM | POA: Diagnosis not present

## 2020-05-03 DIAGNOSIS — I5032 Chronic diastolic (congestive) heart failure: Secondary | ICD-10-CM | POA: Diagnosis not present

## 2020-05-06 DIAGNOSIS — N6019 Diffuse cystic mastopathy of unspecified breast: Secondary | ICD-10-CM | POA: Diagnosis not present

## 2020-05-06 DIAGNOSIS — Z8673 Personal history of transient ischemic attack (TIA), and cerebral infarction without residual deficits: Secondary | ICD-10-CM | POA: Diagnosis not present

## 2020-05-06 DIAGNOSIS — I701 Atherosclerosis of renal artery: Secondary | ICD-10-CM | POA: Diagnosis not present

## 2020-05-06 DIAGNOSIS — F0280 Dementia in other diseases classified elsewhere without behavioral disturbance: Secondary | ICD-10-CM | POA: Diagnosis not present

## 2020-05-06 DIAGNOSIS — Z7901 Long term (current) use of anticoagulants: Secondary | ICD-10-CM | POA: Diagnosis not present

## 2020-05-06 DIAGNOSIS — I672 Cerebral atherosclerosis: Secondary | ICD-10-CM | POA: Diagnosis not present

## 2020-05-06 DIAGNOSIS — I130 Hypertensive heart and chronic kidney disease with heart failure and stage 1 through stage 4 chronic kidney disease, or unspecified chronic kidney disease: Secondary | ICD-10-CM | POA: Diagnosis not present

## 2020-05-06 DIAGNOSIS — I773 Arterial fibromuscular dysplasia: Secondary | ICD-10-CM | POA: Diagnosis not present

## 2020-05-06 DIAGNOSIS — I480 Paroxysmal atrial fibrillation: Secondary | ICD-10-CM | POA: Diagnosis not present

## 2020-05-06 DIAGNOSIS — I5032 Chronic diastolic (congestive) heart failure: Secondary | ICD-10-CM | POA: Diagnosis not present

## 2020-05-06 DIAGNOSIS — N183 Chronic kidney disease, stage 3 unspecified: Secondary | ICD-10-CM | POA: Diagnosis not present

## 2020-05-06 DIAGNOSIS — I081 Rheumatic disorders of both mitral and tricuspid valves: Secondary | ICD-10-CM | POA: Diagnosis not present

## 2020-05-06 DIAGNOSIS — R32 Unspecified urinary incontinence: Secondary | ICD-10-CM | POA: Diagnosis not present

## 2020-05-06 DIAGNOSIS — G309 Alzheimer's disease, unspecified: Secondary | ICD-10-CM | POA: Diagnosis not present

## 2020-05-07 DIAGNOSIS — N183 Chronic kidney disease, stage 3 unspecified: Secondary | ICD-10-CM | POA: Diagnosis not present

## 2020-05-07 DIAGNOSIS — I5032 Chronic diastolic (congestive) heart failure: Secondary | ICD-10-CM | POA: Diagnosis not present

## 2020-05-07 DIAGNOSIS — I130 Hypertensive heart and chronic kidney disease with heart failure and stage 1 through stage 4 chronic kidney disease, or unspecified chronic kidney disease: Secondary | ICD-10-CM | POA: Diagnosis not present

## 2020-05-07 DIAGNOSIS — I672 Cerebral atherosclerosis: Secondary | ICD-10-CM | POA: Diagnosis not present

## 2020-05-07 DIAGNOSIS — F0280 Dementia in other diseases classified elsewhere without behavioral disturbance: Secondary | ICD-10-CM | POA: Diagnosis not present

## 2020-05-07 DIAGNOSIS — G309 Alzheimer's disease, unspecified: Secondary | ICD-10-CM | POA: Diagnosis not present

## 2020-05-08 DIAGNOSIS — I5032 Chronic diastolic (congestive) heart failure: Secondary | ICD-10-CM | POA: Diagnosis not present

## 2020-05-08 DIAGNOSIS — F0280 Dementia in other diseases classified elsewhere without behavioral disturbance: Secondary | ICD-10-CM | POA: Diagnosis not present

## 2020-05-08 DIAGNOSIS — G309 Alzheimer's disease, unspecified: Secondary | ICD-10-CM | POA: Diagnosis not present

## 2020-05-08 DIAGNOSIS — N183 Chronic kidney disease, stage 3 unspecified: Secondary | ICD-10-CM | POA: Diagnosis not present

## 2020-05-08 DIAGNOSIS — I130 Hypertensive heart and chronic kidney disease with heart failure and stage 1 through stage 4 chronic kidney disease, or unspecified chronic kidney disease: Secondary | ICD-10-CM | POA: Diagnosis not present

## 2020-05-08 DIAGNOSIS — I672 Cerebral atherosclerosis: Secondary | ICD-10-CM | POA: Diagnosis not present

## 2020-05-09 DIAGNOSIS — I672 Cerebral atherosclerosis: Secondary | ICD-10-CM | POA: Diagnosis not present

## 2020-05-09 DIAGNOSIS — N183 Chronic kidney disease, stage 3 unspecified: Secondary | ICD-10-CM | POA: Diagnosis not present

## 2020-05-09 DIAGNOSIS — G309 Alzheimer's disease, unspecified: Secondary | ICD-10-CM | POA: Diagnosis not present

## 2020-05-09 DIAGNOSIS — I130 Hypertensive heart and chronic kidney disease with heart failure and stage 1 through stage 4 chronic kidney disease, or unspecified chronic kidney disease: Secondary | ICD-10-CM | POA: Diagnosis not present

## 2020-05-09 DIAGNOSIS — F0280 Dementia in other diseases classified elsewhere without behavioral disturbance: Secondary | ICD-10-CM | POA: Diagnosis not present

## 2020-05-09 DIAGNOSIS — I5032 Chronic diastolic (congestive) heart failure: Secondary | ICD-10-CM | POA: Diagnosis not present

## 2020-05-10 DIAGNOSIS — I672 Cerebral atherosclerosis: Secondary | ICD-10-CM | POA: Diagnosis not present

## 2020-05-10 DIAGNOSIS — I130 Hypertensive heart and chronic kidney disease with heart failure and stage 1 through stage 4 chronic kidney disease, or unspecified chronic kidney disease: Secondary | ICD-10-CM | POA: Diagnosis not present

## 2020-05-10 DIAGNOSIS — F0280 Dementia in other diseases classified elsewhere without behavioral disturbance: Secondary | ICD-10-CM | POA: Diagnosis not present

## 2020-05-10 DIAGNOSIS — I5032 Chronic diastolic (congestive) heart failure: Secondary | ICD-10-CM | POA: Diagnosis not present

## 2020-05-10 DIAGNOSIS — N183 Chronic kidney disease, stage 3 unspecified: Secondary | ICD-10-CM | POA: Diagnosis not present

## 2020-05-10 DIAGNOSIS — G309 Alzheimer's disease, unspecified: Secondary | ICD-10-CM | POA: Diagnosis not present

## 2020-05-11 DIAGNOSIS — I5032 Chronic diastolic (congestive) heart failure: Secondary | ICD-10-CM | POA: Diagnosis not present

## 2020-05-11 DIAGNOSIS — I130 Hypertensive heart and chronic kidney disease with heart failure and stage 1 through stage 4 chronic kidney disease, or unspecified chronic kidney disease: Secondary | ICD-10-CM | POA: Diagnosis not present

## 2020-05-11 DIAGNOSIS — F0280 Dementia in other diseases classified elsewhere without behavioral disturbance: Secondary | ICD-10-CM | POA: Diagnosis not present

## 2020-05-11 DIAGNOSIS — G309 Alzheimer's disease, unspecified: Secondary | ICD-10-CM | POA: Diagnosis not present

## 2020-05-11 DIAGNOSIS — I672 Cerebral atherosclerosis: Secondary | ICD-10-CM | POA: Diagnosis not present

## 2020-05-11 DIAGNOSIS — N183 Chronic kidney disease, stage 3 unspecified: Secondary | ICD-10-CM | POA: Diagnosis not present

## 2020-05-14 DIAGNOSIS — G309 Alzheimer's disease, unspecified: Secondary | ICD-10-CM | POA: Diagnosis not present

## 2020-05-14 DIAGNOSIS — N183 Chronic kidney disease, stage 3 unspecified: Secondary | ICD-10-CM | POA: Diagnosis not present

## 2020-05-14 DIAGNOSIS — F0280 Dementia in other diseases classified elsewhere without behavioral disturbance: Secondary | ICD-10-CM | POA: Diagnosis not present

## 2020-05-14 DIAGNOSIS — I5032 Chronic diastolic (congestive) heart failure: Secondary | ICD-10-CM | POA: Diagnosis not present

## 2020-05-14 DIAGNOSIS — I672 Cerebral atherosclerosis: Secondary | ICD-10-CM | POA: Diagnosis not present

## 2020-05-14 DIAGNOSIS — I130 Hypertensive heart and chronic kidney disease with heart failure and stage 1 through stage 4 chronic kidney disease, or unspecified chronic kidney disease: Secondary | ICD-10-CM | POA: Diagnosis not present

## 2020-05-15 DIAGNOSIS — I5032 Chronic diastolic (congestive) heart failure: Secondary | ICD-10-CM | POA: Diagnosis not present

## 2020-05-15 DIAGNOSIS — G309 Alzheimer's disease, unspecified: Secondary | ICD-10-CM | POA: Diagnosis not present

## 2020-05-15 DIAGNOSIS — I130 Hypertensive heart and chronic kidney disease with heart failure and stage 1 through stage 4 chronic kidney disease, or unspecified chronic kidney disease: Secondary | ICD-10-CM | POA: Diagnosis not present

## 2020-05-15 DIAGNOSIS — N183 Chronic kidney disease, stage 3 unspecified: Secondary | ICD-10-CM | POA: Diagnosis not present

## 2020-05-15 DIAGNOSIS — I672 Cerebral atherosclerosis: Secondary | ICD-10-CM | POA: Diagnosis not present

## 2020-05-15 DIAGNOSIS — F0280 Dementia in other diseases classified elsewhere without behavioral disturbance: Secondary | ICD-10-CM | POA: Diagnosis not present

## 2020-05-16 DIAGNOSIS — I672 Cerebral atherosclerosis: Secondary | ICD-10-CM | POA: Diagnosis not present

## 2020-05-16 DIAGNOSIS — I130 Hypertensive heart and chronic kidney disease with heart failure and stage 1 through stage 4 chronic kidney disease, or unspecified chronic kidney disease: Secondary | ICD-10-CM | POA: Diagnosis not present

## 2020-05-16 DIAGNOSIS — N183 Chronic kidney disease, stage 3 unspecified: Secondary | ICD-10-CM | POA: Diagnosis not present

## 2020-05-16 DIAGNOSIS — G309 Alzheimer's disease, unspecified: Secondary | ICD-10-CM | POA: Diagnosis not present

## 2020-05-16 DIAGNOSIS — I5032 Chronic diastolic (congestive) heart failure: Secondary | ICD-10-CM | POA: Diagnosis not present

## 2020-05-16 DIAGNOSIS — F0280 Dementia in other diseases classified elsewhere without behavioral disturbance: Secondary | ICD-10-CM | POA: Diagnosis not present

## 2020-05-17 DIAGNOSIS — I5032 Chronic diastolic (congestive) heart failure: Secondary | ICD-10-CM | POA: Diagnosis not present

## 2020-05-17 DIAGNOSIS — I672 Cerebral atherosclerosis: Secondary | ICD-10-CM | POA: Diagnosis not present

## 2020-05-17 DIAGNOSIS — I130 Hypertensive heart and chronic kidney disease with heart failure and stage 1 through stage 4 chronic kidney disease, or unspecified chronic kidney disease: Secondary | ICD-10-CM | POA: Diagnosis not present

## 2020-05-17 DIAGNOSIS — F0280 Dementia in other diseases classified elsewhere without behavioral disturbance: Secondary | ICD-10-CM | POA: Diagnosis not present

## 2020-05-17 DIAGNOSIS — G309 Alzheimer's disease, unspecified: Secondary | ICD-10-CM | POA: Diagnosis not present

## 2020-05-17 DIAGNOSIS — N183 Chronic kidney disease, stage 3 unspecified: Secondary | ICD-10-CM | POA: Diagnosis not present

## 2020-05-18 DIAGNOSIS — G309 Alzheimer's disease, unspecified: Secondary | ICD-10-CM | POA: Diagnosis not present

## 2020-05-18 DIAGNOSIS — I672 Cerebral atherosclerosis: Secondary | ICD-10-CM | POA: Diagnosis not present

## 2020-05-18 DIAGNOSIS — I130 Hypertensive heart and chronic kidney disease with heart failure and stage 1 through stage 4 chronic kidney disease, or unspecified chronic kidney disease: Secondary | ICD-10-CM | POA: Diagnosis not present

## 2020-05-18 DIAGNOSIS — I5032 Chronic diastolic (congestive) heart failure: Secondary | ICD-10-CM | POA: Diagnosis not present

## 2020-05-18 DIAGNOSIS — N183 Chronic kidney disease, stage 3 unspecified: Secondary | ICD-10-CM | POA: Diagnosis not present

## 2020-05-18 DIAGNOSIS — F0280 Dementia in other diseases classified elsewhere without behavioral disturbance: Secondary | ICD-10-CM | POA: Diagnosis not present

## 2020-05-21 DIAGNOSIS — F0280 Dementia in other diseases classified elsewhere without behavioral disturbance: Secondary | ICD-10-CM | POA: Diagnosis not present

## 2020-05-21 DIAGNOSIS — N183 Chronic kidney disease, stage 3 unspecified: Secondary | ICD-10-CM | POA: Diagnosis not present

## 2020-05-21 DIAGNOSIS — G309 Alzheimer's disease, unspecified: Secondary | ICD-10-CM | POA: Diagnosis not present

## 2020-05-21 DIAGNOSIS — I5032 Chronic diastolic (congestive) heart failure: Secondary | ICD-10-CM | POA: Diagnosis not present

## 2020-05-21 DIAGNOSIS — I672 Cerebral atherosclerosis: Secondary | ICD-10-CM | POA: Diagnosis not present

## 2020-05-21 DIAGNOSIS — I130 Hypertensive heart and chronic kidney disease with heart failure and stage 1 through stage 4 chronic kidney disease, or unspecified chronic kidney disease: Secondary | ICD-10-CM | POA: Diagnosis not present

## 2020-05-22 DIAGNOSIS — I672 Cerebral atherosclerosis: Secondary | ICD-10-CM | POA: Diagnosis not present

## 2020-05-22 DIAGNOSIS — I5032 Chronic diastolic (congestive) heart failure: Secondary | ICD-10-CM | POA: Diagnosis not present

## 2020-05-22 DIAGNOSIS — N183 Chronic kidney disease, stage 3 unspecified: Secondary | ICD-10-CM | POA: Diagnosis not present

## 2020-05-22 DIAGNOSIS — G309 Alzheimer's disease, unspecified: Secondary | ICD-10-CM | POA: Diagnosis not present

## 2020-05-22 DIAGNOSIS — I130 Hypertensive heart and chronic kidney disease with heart failure and stage 1 through stage 4 chronic kidney disease, or unspecified chronic kidney disease: Secondary | ICD-10-CM | POA: Diagnosis not present

## 2020-05-22 DIAGNOSIS — F0280 Dementia in other diseases classified elsewhere without behavioral disturbance: Secondary | ICD-10-CM | POA: Diagnosis not present

## 2020-05-23 DIAGNOSIS — I130 Hypertensive heart and chronic kidney disease with heart failure and stage 1 through stage 4 chronic kidney disease, or unspecified chronic kidney disease: Secondary | ICD-10-CM | POA: Diagnosis not present

## 2020-05-23 DIAGNOSIS — N183 Chronic kidney disease, stage 3 unspecified: Secondary | ICD-10-CM | POA: Diagnosis not present

## 2020-05-23 DIAGNOSIS — G309 Alzheimer's disease, unspecified: Secondary | ICD-10-CM | POA: Diagnosis not present

## 2020-05-23 DIAGNOSIS — I5032 Chronic diastolic (congestive) heart failure: Secondary | ICD-10-CM | POA: Diagnosis not present

## 2020-05-23 DIAGNOSIS — I672 Cerebral atherosclerosis: Secondary | ICD-10-CM | POA: Diagnosis not present

## 2020-05-23 DIAGNOSIS — F0280 Dementia in other diseases classified elsewhere without behavioral disturbance: Secondary | ICD-10-CM | POA: Diagnosis not present

## 2020-05-24 DIAGNOSIS — I5032 Chronic diastolic (congestive) heart failure: Secondary | ICD-10-CM | POA: Diagnosis not present

## 2020-05-24 DIAGNOSIS — F0280 Dementia in other diseases classified elsewhere without behavioral disturbance: Secondary | ICD-10-CM | POA: Diagnosis not present

## 2020-05-24 DIAGNOSIS — I672 Cerebral atherosclerosis: Secondary | ICD-10-CM | POA: Diagnosis not present

## 2020-05-24 DIAGNOSIS — G309 Alzheimer's disease, unspecified: Secondary | ICD-10-CM | POA: Diagnosis not present

## 2020-05-24 DIAGNOSIS — I130 Hypertensive heart and chronic kidney disease with heart failure and stage 1 through stage 4 chronic kidney disease, or unspecified chronic kidney disease: Secondary | ICD-10-CM | POA: Diagnosis not present

## 2020-05-24 DIAGNOSIS — N183 Chronic kidney disease, stage 3 unspecified: Secondary | ICD-10-CM | POA: Diagnosis not present

## 2020-05-25 DIAGNOSIS — F0280 Dementia in other diseases classified elsewhere without behavioral disturbance: Secondary | ICD-10-CM | POA: Diagnosis not present

## 2020-05-25 DIAGNOSIS — I672 Cerebral atherosclerosis: Secondary | ICD-10-CM | POA: Diagnosis not present

## 2020-05-25 DIAGNOSIS — N183 Chronic kidney disease, stage 3 unspecified: Secondary | ICD-10-CM | POA: Diagnosis not present

## 2020-05-25 DIAGNOSIS — I5032 Chronic diastolic (congestive) heart failure: Secondary | ICD-10-CM | POA: Diagnosis not present

## 2020-05-25 DIAGNOSIS — G309 Alzheimer's disease, unspecified: Secondary | ICD-10-CM | POA: Diagnosis not present

## 2020-05-25 DIAGNOSIS — I130 Hypertensive heart and chronic kidney disease with heart failure and stage 1 through stage 4 chronic kidney disease, or unspecified chronic kidney disease: Secondary | ICD-10-CM | POA: Diagnosis not present

## 2020-05-28 DIAGNOSIS — I5032 Chronic diastolic (congestive) heart failure: Secondary | ICD-10-CM | POA: Diagnosis not present

## 2020-05-28 DIAGNOSIS — I672 Cerebral atherosclerosis: Secondary | ICD-10-CM | POA: Diagnosis not present

## 2020-05-28 DIAGNOSIS — F0280 Dementia in other diseases classified elsewhere without behavioral disturbance: Secondary | ICD-10-CM | POA: Diagnosis not present

## 2020-05-28 DIAGNOSIS — G309 Alzheimer's disease, unspecified: Secondary | ICD-10-CM | POA: Diagnosis not present

## 2020-05-28 DIAGNOSIS — N183 Chronic kidney disease, stage 3 unspecified: Secondary | ICD-10-CM | POA: Diagnosis not present

## 2020-05-28 DIAGNOSIS — I130 Hypertensive heart and chronic kidney disease with heart failure and stage 1 through stage 4 chronic kidney disease, or unspecified chronic kidney disease: Secondary | ICD-10-CM | POA: Diagnosis not present

## 2020-05-29 DIAGNOSIS — F0280 Dementia in other diseases classified elsewhere without behavioral disturbance: Secondary | ICD-10-CM | POA: Diagnosis not present

## 2020-05-29 DIAGNOSIS — I130 Hypertensive heart and chronic kidney disease with heart failure and stage 1 through stage 4 chronic kidney disease, or unspecified chronic kidney disease: Secondary | ICD-10-CM | POA: Diagnosis not present

## 2020-05-29 DIAGNOSIS — N183 Chronic kidney disease, stage 3 unspecified: Secondary | ICD-10-CM | POA: Diagnosis not present

## 2020-05-29 DIAGNOSIS — I672 Cerebral atherosclerosis: Secondary | ICD-10-CM | POA: Diagnosis not present

## 2020-05-29 DIAGNOSIS — I5032 Chronic diastolic (congestive) heart failure: Secondary | ICD-10-CM | POA: Diagnosis not present

## 2020-05-29 DIAGNOSIS — G309 Alzheimer's disease, unspecified: Secondary | ICD-10-CM | POA: Diagnosis not present

## 2020-05-30 DIAGNOSIS — I5032 Chronic diastolic (congestive) heart failure: Secondary | ICD-10-CM | POA: Diagnosis not present

## 2020-05-30 DIAGNOSIS — N183 Chronic kidney disease, stage 3 unspecified: Secondary | ICD-10-CM | POA: Diagnosis not present

## 2020-05-30 DIAGNOSIS — G309 Alzheimer's disease, unspecified: Secondary | ICD-10-CM | POA: Diagnosis not present

## 2020-05-30 DIAGNOSIS — I130 Hypertensive heart and chronic kidney disease with heart failure and stage 1 through stage 4 chronic kidney disease, or unspecified chronic kidney disease: Secondary | ICD-10-CM | POA: Diagnosis not present

## 2020-05-30 DIAGNOSIS — I672 Cerebral atherosclerosis: Secondary | ICD-10-CM | POA: Diagnosis not present

## 2020-05-30 DIAGNOSIS — F0280 Dementia in other diseases classified elsewhere without behavioral disturbance: Secondary | ICD-10-CM | POA: Diagnosis not present

## 2020-05-31 DIAGNOSIS — G309 Alzheimer's disease, unspecified: Secondary | ICD-10-CM | POA: Diagnosis not present

## 2020-05-31 DIAGNOSIS — F0280 Dementia in other diseases classified elsewhere without behavioral disturbance: Secondary | ICD-10-CM | POA: Diagnosis not present

## 2020-05-31 DIAGNOSIS — I130 Hypertensive heart and chronic kidney disease with heart failure and stage 1 through stage 4 chronic kidney disease, or unspecified chronic kidney disease: Secondary | ICD-10-CM | POA: Diagnosis not present

## 2020-05-31 DIAGNOSIS — I5032 Chronic diastolic (congestive) heart failure: Secondary | ICD-10-CM | POA: Diagnosis not present

## 2020-05-31 DIAGNOSIS — I672 Cerebral atherosclerosis: Secondary | ICD-10-CM | POA: Diagnosis not present

## 2020-05-31 DIAGNOSIS — N183 Chronic kidney disease, stage 3 unspecified: Secondary | ICD-10-CM | POA: Diagnosis not present

## 2020-06-01 DIAGNOSIS — I5032 Chronic diastolic (congestive) heart failure: Secondary | ICD-10-CM | POA: Diagnosis not present

## 2020-06-01 DIAGNOSIS — F0280 Dementia in other diseases classified elsewhere without behavioral disturbance: Secondary | ICD-10-CM | POA: Diagnosis not present

## 2020-06-01 DIAGNOSIS — I130 Hypertensive heart and chronic kidney disease with heart failure and stage 1 through stage 4 chronic kidney disease, or unspecified chronic kidney disease: Secondary | ICD-10-CM | POA: Diagnosis not present

## 2020-06-01 DIAGNOSIS — I672 Cerebral atherosclerosis: Secondary | ICD-10-CM | POA: Diagnosis not present

## 2020-06-01 DIAGNOSIS — N183 Chronic kidney disease, stage 3 unspecified: Secondary | ICD-10-CM | POA: Diagnosis not present

## 2020-06-01 DIAGNOSIS — G309 Alzheimer's disease, unspecified: Secondary | ICD-10-CM | POA: Diagnosis not present

## 2020-06-04 DIAGNOSIS — F0280 Dementia in other diseases classified elsewhere without behavioral disturbance: Secondary | ICD-10-CM | POA: Diagnosis not present

## 2020-06-04 DIAGNOSIS — I130 Hypertensive heart and chronic kidney disease with heart failure and stage 1 through stage 4 chronic kidney disease, or unspecified chronic kidney disease: Secondary | ICD-10-CM | POA: Diagnosis not present

## 2020-06-04 DIAGNOSIS — I5032 Chronic diastolic (congestive) heart failure: Secondary | ICD-10-CM | POA: Diagnosis not present

## 2020-06-04 DIAGNOSIS — N183 Chronic kidney disease, stage 3 unspecified: Secondary | ICD-10-CM | POA: Diagnosis not present

## 2020-06-04 DIAGNOSIS — G309 Alzheimer's disease, unspecified: Secondary | ICD-10-CM | POA: Diagnosis not present

## 2020-06-04 DIAGNOSIS — I672 Cerebral atherosclerosis: Secondary | ICD-10-CM | POA: Diagnosis not present

## 2020-06-05 DIAGNOSIS — N183 Chronic kidney disease, stage 3 unspecified: Secondary | ICD-10-CM | POA: Diagnosis not present

## 2020-06-05 DIAGNOSIS — I5032 Chronic diastolic (congestive) heart failure: Secondary | ICD-10-CM | POA: Diagnosis not present

## 2020-06-05 DIAGNOSIS — I130 Hypertensive heart and chronic kidney disease with heart failure and stage 1 through stage 4 chronic kidney disease, or unspecified chronic kidney disease: Secondary | ICD-10-CM | POA: Diagnosis not present

## 2020-06-05 DIAGNOSIS — G309 Alzheimer's disease, unspecified: Secondary | ICD-10-CM | POA: Diagnosis not present

## 2020-06-05 DIAGNOSIS — I672 Cerebral atherosclerosis: Secondary | ICD-10-CM | POA: Diagnosis not present

## 2020-06-05 DIAGNOSIS — F0280 Dementia in other diseases classified elsewhere without behavioral disturbance: Secondary | ICD-10-CM | POA: Diagnosis not present

## 2020-06-06 DIAGNOSIS — I081 Rheumatic disorders of both mitral and tricuspid valves: Secondary | ICD-10-CM | POA: Diagnosis not present

## 2020-06-06 DIAGNOSIS — Z7901 Long term (current) use of anticoagulants: Secondary | ICD-10-CM | POA: Diagnosis not present

## 2020-06-06 DIAGNOSIS — I672 Cerebral atherosclerosis: Secondary | ICD-10-CM | POA: Diagnosis not present

## 2020-06-06 DIAGNOSIS — I5032 Chronic diastolic (congestive) heart failure: Secondary | ICD-10-CM | POA: Diagnosis not present

## 2020-06-06 DIAGNOSIS — N183 Chronic kidney disease, stage 3 unspecified: Secondary | ICD-10-CM | POA: Diagnosis not present

## 2020-06-06 DIAGNOSIS — G309 Alzheimer's disease, unspecified: Secondary | ICD-10-CM | POA: Diagnosis not present

## 2020-06-06 DIAGNOSIS — I480 Paroxysmal atrial fibrillation: Secondary | ICD-10-CM | POA: Diagnosis not present

## 2020-06-06 DIAGNOSIS — I701 Atherosclerosis of renal artery: Secondary | ICD-10-CM | POA: Diagnosis not present

## 2020-06-06 DIAGNOSIS — I130 Hypertensive heart and chronic kidney disease with heart failure and stage 1 through stage 4 chronic kidney disease, or unspecified chronic kidney disease: Secondary | ICD-10-CM | POA: Diagnosis not present

## 2020-06-06 DIAGNOSIS — R32 Unspecified urinary incontinence: Secondary | ICD-10-CM | POA: Diagnosis not present

## 2020-06-06 DIAGNOSIS — N6019 Diffuse cystic mastopathy of unspecified breast: Secondary | ICD-10-CM | POA: Diagnosis not present

## 2020-06-06 DIAGNOSIS — F0280 Dementia in other diseases classified elsewhere without behavioral disturbance: Secondary | ICD-10-CM | POA: Diagnosis not present

## 2020-06-06 DIAGNOSIS — I773 Arterial fibromuscular dysplasia: Secondary | ICD-10-CM | POA: Diagnosis not present

## 2020-06-06 DIAGNOSIS — Z8673 Personal history of transient ischemic attack (TIA), and cerebral infarction without residual deficits: Secondary | ICD-10-CM | POA: Diagnosis not present

## 2020-06-07 DIAGNOSIS — I672 Cerebral atherosclerosis: Secondary | ICD-10-CM | POA: Diagnosis not present

## 2020-06-07 DIAGNOSIS — F0280 Dementia in other diseases classified elsewhere without behavioral disturbance: Secondary | ICD-10-CM | POA: Diagnosis not present

## 2020-06-07 DIAGNOSIS — G309 Alzheimer's disease, unspecified: Secondary | ICD-10-CM | POA: Diagnosis not present

## 2020-06-07 DIAGNOSIS — I5032 Chronic diastolic (congestive) heart failure: Secondary | ICD-10-CM | POA: Diagnosis not present

## 2020-06-07 DIAGNOSIS — I130 Hypertensive heart and chronic kidney disease with heart failure and stage 1 through stage 4 chronic kidney disease, or unspecified chronic kidney disease: Secondary | ICD-10-CM | POA: Diagnosis not present

## 2020-06-07 DIAGNOSIS — N183 Chronic kidney disease, stage 3 unspecified: Secondary | ICD-10-CM | POA: Diagnosis not present

## 2020-06-08 DIAGNOSIS — G309 Alzheimer's disease, unspecified: Secondary | ICD-10-CM | POA: Diagnosis not present

## 2020-06-08 DIAGNOSIS — F0280 Dementia in other diseases classified elsewhere without behavioral disturbance: Secondary | ICD-10-CM | POA: Diagnosis not present

## 2020-06-08 DIAGNOSIS — I672 Cerebral atherosclerosis: Secondary | ICD-10-CM | POA: Diagnosis not present

## 2020-06-08 DIAGNOSIS — I130 Hypertensive heart and chronic kidney disease with heart failure and stage 1 through stage 4 chronic kidney disease, or unspecified chronic kidney disease: Secondary | ICD-10-CM | POA: Diagnosis not present

## 2020-06-08 DIAGNOSIS — I5032 Chronic diastolic (congestive) heart failure: Secondary | ICD-10-CM | POA: Diagnosis not present

## 2020-06-08 DIAGNOSIS — N183 Chronic kidney disease, stage 3 unspecified: Secondary | ICD-10-CM | POA: Diagnosis not present

## 2020-06-10 NOTE — Progress Notes (Signed)
Cardiology Office Note:    Date:  06/11/2020   ID:  DARL [name], DOB May 16, 1935, MRN [id number]  PCP:  Donna Huddle, MD  Cardiologist:  Donna Grooms, MD   Referring MD: Donna Huddle, MD   Chief Complaint  Patient presents with  . Coronary Artery Disease  . Atrial Fibrillation    History of Present Illness:    Donna [name] is a 85 y.o. female with a hx of atherosclerotic kidney disease, hypertension,PAF - on Tikosyn, chronic diastolic heart failure when in A. fib, and chronic anticoagulation therapywith Eliquis.   She is brought in today by her daughter.  She has had difficulty with falling.  The most recent episode occurred in April and there was a left hip laceration.  She denies angina.  In February there was a "TIA".  Neurological evaluation included scanning which demonstrated fairly extensive microvascular disease with brain volume loss.  There has been difficulty with timely administration of her medications.  This problem has been improved by now having sitters in the house.  Donna [name] has some difficulty ensuring that she received therapy.  Occasionally Donna [name] will refuse therapy on her own.  No acute complaints today.  Past Medical History:  Diagnosis Date  . Alzheimer disease ([place]) 06/27/2019  . Atherosclerosis of renal artery (H[facility])    a. s/p R RA stenting;  b. 04/2014 Renal Art duplex: Patent R RA stent, <60% L RA stenosis. Followed by [facility].  . Carotid arterial disease ([place])    a. 01/2014 Carotid U/S: RICA 23%, LICA 53%. Followed by [facility].  . CKD (chronic kidney disease), stage III (H[facility])    Stage II/III  . Essential hypertension   . Fibrocystic breast   . Fibromuscular dysplasia ([place])   . Hyperglycemia   . Mitral regurgitation    a. Echo 08/2014: mild MR.  . Paroxysmal atrial fibrillation ([place])    a. Dx 07/26/2014, CHA2DS2VASc = 5-->Eliquis;  c. 08/2014 Echo: EF 55-60%, no rwma, mildly dil LA/RA, mod-sev TR, PASP 44mHg. b. s/p DCCV 08/2014. c. back  in atrial flutter 09/2014 -> rate control pursued.  . [place]mal [place] ([place]    a. Dx 07/26/2014, CHA2DS2VASc = 5-->Eliquis;  c. 08/2014 Echo: EF 55-60%, no rwma, mildly dil LA/RA, mod-sev TR, PASP 320mg. b. s/p DCCV 08/2014. c. back in atrial flutter 09/2014 -> rate control pursued.  . Tricuspid regurgitation    a. Echo 08/2014: mod-severe.    Past Surgical History:  Procedure Laterality Date  . CARDIOVERSION N/A 09/01/2014   Procedure: CARDIOVERSION;  Surgeon: PeJosue HectorMD;  [facility];  Service: Cardiovascular;  Laterality: N/A;  . CARDIOVERSION N/A 11/23/2014   Procedure: CARDIOVERSION;  Surgeon: TiSkeet LatchMD;  [facility] Service: Cardiovascular;  Laterality: N/A;  . HEMORROIDECTOMY    . REDUCTION MAMMAPLASTY Bilateral   . RENAL ARTERY STENT  2008   Right renal artery by Dr. GrDrucie Opitz. TONSILLECTOMY    . TUBAL LIGATION  1970's    Current Medications: Current Meds  Medication Sig  . amLODipine (NORVASC) 10 MG tablet Take 1 tablet (10 mg total) by mouth daily.  . Marland Kitchenpixaban (ELIQUIS) 2.5 MG TABS tablet Take 1 tablet (2.5 mg total) by mouth 2 (two) times daily.  . Marland Kitchentorvastatin (LIPITOR) 10 MG tablet Take 1 tablet (10 mg total) by mouth daily.  . Cholecalciferol (VITAMIN D3 PO) Take 1 tablet by mouth daily.  . Marland Kitchenofetilide (TIKOSYN) 125 MCG capsule Take 3 capsules (  375 mcg total) by mouth 2 (two) times daily.  . fluticasone (FLONASE) 50 MCG/ACT nasal spray Place 1 spray into both nostrils daily as needed for allergies or rhinitis.  . furosemide (LASIX) 20 MG tablet Take 1 tablet by mouth once daily  . magnesium oxide (MAG-OX) 400 MG tablet Take 1 tablet (400 mg total) by mouth every morning.  . metFORMIN (GLUCOPHAGE-XR) 500 MG 24 hr tablet Take 500 mg by mouth at bedtime.  . metoprolol succinate (TOPROL XL) 25 MG 24 hr tablet Take 0.5 tablets (12.5 mg total) by mouth daily.  . mirtazapine (REMERON) 30 MG tablet Take 30 mg by mouth at  bedtime.  . Multiple Vitamin (MULTIVITAMIN WITH MINERALS) TABS tablet Take 1 tablet by mouth daily.  Marland Kitchen NAMZARIC 28-10 MG CP24 Take 1 capsule by mouth at bedtime.  . nitroGLYCERIN (NITROSTAT) 0.4 MG SL tablet Place 1 tablet (0.4 mg total) under the tongue every 5 (five) minutes as needed for chest pain.  . potassium chloride SA (KLOR-CON M20) 20 MEQ tablet Take 1 tablet (20 mEq total) by mouth daily.  . vitamin E 400 UNIT capsule Take 400 Units by mouth daily.     Allergies:   Tagamet [cimetidine] and Diovan [valsartan]   Social History   Socioeconomic History  . Marital status: Married    Spouse name: Denya Buckingham  . Number of children: 2  . Years of education: college  . Highest education level: Bachelor's degree (e.g., BA, AB, BS)  Occupational History  . Occupation: Retired  Tobacco Use  . Smoking status: Never Smoker  . Smokeless tobacco: Never Used  Vaping Use  . Vaping Use: Never used  Substance and Sexual Activity  . Alcohol use: Yes  . Drug use: No  . Sexual activity: Yes    Birth control/protection: Surgical    Comment: BTL  Other Topics Concern  . Not on file  Social History Narrative  . Not on file   Social Determinants of Health   Financial Resource Strain: Low Risk   . Difficulty of Paying Living Expenses: Not hard at all  Food Insecurity: No Food Insecurity  . Worried About Charity fundraiser in the Last Year: Never true  . Ran Out of Food in the Last Year: Never true  Transportation Needs: No Transportation Needs  . Lack of Transportation (Medical): No  . Lack of Transportation (Non-Medical): No  Physical Activity: Inactive  . Days of Exercise per Week: 0 days  . Minutes of Exercise per Session: 0 min  Stress: No Stress Concern Present  . Feeling of Stress : Only a little  Social Connections: Socially Integrated  . Frequency of Communication with Friends and Family: More than three times a week  . Frequency of Social Gatherings with Friends and  Family: More than three times a week  . Attends Religious Services: More than 4 times per year  . Active Member of Clubs or Organizations: Yes  . Attends Archivist Meetings: 1 to 4 times per year  . Marital Status: Married     Family History: The patient's family history includes Aneurysm in her father; Cancer (age of onset: 22) in her sister; Diabetes in her father and mother; Heart attack in her father; Hypertension in her father and mother; Stroke (age of onset: 62) in her mother. There is no history of Breast cancer.  ROS:   Please see the history of present illness.    Decreased memory.  At times does not want  to take her medications.  There was dependence upon her husband to ensure that medications were being given but this did not work well.  She now has significant help at home and we can be assured that she does receive the medications every day.  She eats 2 meals per day.  All other systems reviewed and are negative.  EKGs/Labs/Other Studies Reviewed:    The following studies were reviewed today:   2 D Doppler Echocardiogram 01/2020: IMPRESSIONS    1. Left ventricular ejection fraction, by estimation, is 55 to 60%. The  left ventricle has normal function. The left ventricle has no regional  wall motion abnormalities. Left ventricular diastolic parameters are  consistent with Grade II diastolic  dysfunction (pseudonormalization).  2. Right ventricular systolic function is normal. The right ventricular  size is normal. There is moderately elevated pulmonary artery systolic  pressure.  3. Right atrial size was moderately dilated.  4. The mitral valve is normal in structure. Trivial mitral valve  regurgitation. No evidence of mitral stenosis.  5. Tricuspid valve regurgitation is moderate.  6. The aortic valve is tricuspid. There is mild calcification of the  aortic valve. There is mild thickening of the aortic valve. Aortic valve  regurgitation is not  visualized. No aortic stenosis is present.  7. The inferior vena cava is normal in size with greater than 50%  respiratory variability, suggesting right atrial pressure of 3 mmHg.   Abdominal CT scan April 08, 2020:  IMPRESSION: 1. No acute traumatic injury in the abdomen or pelvis. 2. Cystic appearing 1.7 cm pancreatic body mass. Mildly dilated main pancreatic duct. Recommend further characterization with MRI abdomen without and with IV contrast on a short term outpatient basis. No biliary ductal dilatation. 3. Small hiatal hernia. 4. Moderate sigmoid diverticulosis. 5. Aortic Atherosclerosis (ICD10-I70.0).    EKG:  EKG performed April 09, 2020 demonstrates bradycardia AV block, QS pattern in V1 through V3.  Recent Labs: 09/27/2019: Magnesium 2.1 01/08/2020: ALT 24 04/08/2020: BUN 32; Creatinine, Ser 1.20; Hemoglobin 13.9; Platelets 238; Potassium 4.3; Sodium 141  Recent Lipid Panel    Component Value Date/Time   CHOL 138 01/09/2020 0549   CHOL 145 05/03/2019 1116   TRIG 92 01/09/2020 0549   HDL 45 01/09/2020 0549   HDL 52 05/03/2019 1116   CHOLHDL 3.1 01/09/2020 0549   VLDL 18 01/09/2020 0549   LDLCALC 75 01/09/2020 0549   LDLCALC 76 05/03/2019 1116    Physical Exam:    VS:  BP 120/70 (BP Location: Left Arm, Patient Position: Sitting, Cuff Size: Normal)   Pulse 71   Ht '5\' 2"'  (1.575 m)   Wt 115 lb (52.2 kg)   SpO2 96%   BMI 21.03 kg/m     Wt Readings from Last 3 Encounters:  06/11/20 115 lb (52.2 kg)  04/07/20 120 lb (54.4 kg)  02/13/20 124 lb (56.2 kg)     GEN: She appears somewhat confused.  Not sure she understands what I am.. No acute distress HEENT: Normal NECK: No JVD. LYMPHATICS: No lymphadenopathy CARDIAC: No murmur. RRR no gallop, or edema. VASCULAR:  Normal Pulses. No bruits. RESPIRATORY:  Clear to auscultation without rales, wheezing or rhonchi  ABDOMEN: Soft, non-tender, non-distended, No pulsatile mass, MUSCULOSKELETAL: No deformity  SKIN: Warm  and dry NEUROLOGIC:  Alert and oriented x 3 PSYCHIATRIC:  Normal affect   ASSESSMENT:    1. PAF (paroxysmal atrial fibrillation) ([place])   2. High risk medication use   3. Chronic anticoagulation  4. 1st degree AV block   5. PAD (peripheral artery disease) ([place])   6. Atherosclerosis of both carotid arteries   7. TIA (transient ischemic attack)   8. Pancreatic mass    PLAN:    In order of problems listed above:  1. The atrial fibrillation is controlled today based upon exam and a relatively recent EKG from April when she was in sinus rhythm.  She is on Tikosyn therapy.  There were no alarms on the EKG relative to QT prolongation of significance.  The laboratory data revealed potassium and magnesium within normal range in April. 2. 77-monthfollow-up to monitor Tikosyn therapy.  Blood work will be needed then. 3. She is on a subtherapeutic dose of apixaban anticoagulation however she is frail and has had falls, therefore the lower dose therapy may be the lesser of 2 evils. 4. No comment 5. No symptoms 6. Continue preventive therapy with low-dose apixaban and statin therapy.  Also continue amlodipine for blood pressure control. 7. Reviewed imaging from recent TIA evaluation and there is significant microvascular and chronic ischemic changes, lacunar infarct, and evidence of significant volume loss relative to brain tissue. 8. I encouraged the daughter to follow-up the findings on CT scan and to get the MRI done as recommended by radiology to exclude the possibility of pancreatic cancer.  Having the diagnosis will help plan goals of care.  Overall education and awareness concerning primary/secondary risk prevention was discussed in detail: LDL less than 70, hemoglobin A1c less than 7, blood pressure target less than 130/80 mmHg, >150 minutes of moderate aerobic activity per week, avoidance of smoking, weight control (via diet and exercise), and continued surveillance/management of/for  obstructive sleep apnea.  Much of preventive therapy is difficult because she has having increasing problems with steadiness and mobility.  She will return in 6 months for follow-up and will need a be met and magnesium level at the time.  Hemoglobin should be done as well.  Abdominal CT scan was abnormal and MRI recommended to follow-up on pancreatic mass.  I encouraged him to discuss this with primary physician, Dr. NMertha Finders   Medication Adjustments/Labs and Tests Ordered: Current medicines are reviewed at length with the patient today.  Concerns regarding medicines are outlined above.  No orders of the defined types were placed in this encounter.  No orders of the defined types were placed in this encounter.   Patient Instructions  Medication Instructions:  Your physician recommends that you continue on your current medications as directed. Please refer to the Current Medication list given to you today.  *If you need a refill on your cardiac medications before your next appointment, please call your pharmacy*   Lab Work: None If you have labs (blood work) drawn today and your tests are completely normal, you will receive your results only by: .Marland KitchenMyChart Message (if you have MyChart) OR . A paper copy in the mail If you have any lab test that is abnormal or we need to change your treatment, we will call you to review the results.   Testing/Procedures: None   Follow-Up: [facility] and your [facility] needs are our priority.  As part of our continuing mission to provide you with exceptional heart care, we have created designated Provider Care Teams.  These Care Teams include your primary Cardiologist (physician) and Advanced Practice Providers (APPs -  Physician Assistants and Nurse Practitioners) who all work together to provide you with the care you need, when you  need it.  We recommend signing up for the patient portal called "MyChart".  Sign up information is  provided on this After Visit Summary.  MyChart is used to connect with patients for Virtual Visits (Telemedicine).  Patients are able to view lab/test results, encounter notes, upcoming appointments, etc.  Non-urgent messages can be sent to your provider as well.   To learn more about what you can do with MyChart, go to [email or web address].    Your next appointment:   6 month(s)  The format for your next appointment:   In Person  Provider:   You may see Donna Grooms, MD or one of the following Advanced Practice Providers on your designated Care Team:    Kathyrn Drown, NP    Other Instructions      Signed, Donna Grooms, MD  06/11/2020 3:32 PM    [place]

## 2020-06-11 ENCOUNTER — Other Ambulatory Visit: Payer: Self-pay

## 2020-06-11 ENCOUNTER — Ambulatory Visit (INDEPENDENT_AMBULATORY_CARE_PROVIDER_SITE_OTHER): Payer: Medicare Other | Admitting: Interventional Cardiology

## 2020-06-11 ENCOUNTER — Encounter: Payer: Self-pay | Admitting: Interventional Cardiology

## 2020-06-11 VITALS — BP 120/70 | HR 71 | Ht 62.0 in | Wt 115.0 lb

## 2020-06-11 DIAGNOSIS — I739 Peripheral vascular disease, unspecified: Secondary | ICD-10-CM

## 2020-06-11 DIAGNOSIS — I440 Atrioventricular block, first degree: Secondary | ICD-10-CM

## 2020-06-11 DIAGNOSIS — N183 Chronic kidney disease, stage 3 unspecified: Secondary | ICD-10-CM | POA: Diagnosis not present

## 2020-06-11 DIAGNOSIS — I6523 Occlusion and stenosis of bilateral carotid arteries: Secondary | ICD-10-CM

## 2020-06-11 DIAGNOSIS — G459 Transient cerebral ischemic attack, unspecified: Secondary | ICD-10-CM

## 2020-06-11 DIAGNOSIS — I130 Hypertensive heart and chronic kidney disease with heart failure and stage 1 through stage 4 chronic kidney disease, or unspecified chronic kidney disease: Secondary | ICD-10-CM | POA: Diagnosis not present

## 2020-06-11 DIAGNOSIS — I480 Paroxysmal atrial fibrillation: Secondary | ICD-10-CM | POA: Diagnosis not present

## 2020-06-11 DIAGNOSIS — F0280 Dementia in other diseases classified elsewhere without behavioral disturbance: Secondary | ICD-10-CM | POA: Diagnosis not present

## 2020-06-11 DIAGNOSIS — K8689 Other specified diseases of pancreas: Secondary | ICD-10-CM | POA: Diagnosis not present

## 2020-06-11 DIAGNOSIS — I672 Cerebral atherosclerosis: Secondary | ICD-10-CM | POA: Diagnosis not present

## 2020-06-11 DIAGNOSIS — Z79899 Other long term (current) drug therapy: Secondary | ICD-10-CM | POA: Diagnosis not present

## 2020-06-11 DIAGNOSIS — G309 Alzheimer's disease, unspecified: Secondary | ICD-10-CM | POA: Diagnosis not present

## 2020-06-11 DIAGNOSIS — Z7901 Long term (current) use of anticoagulants: Secondary | ICD-10-CM | POA: Diagnosis not present

## 2020-06-11 DIAGNOSIS — I5032 Chronic diastolic (congestive) heart failure: Secondary | ICD-10-CM | POA: Diagnosis not present

## 2020-06-11 MED ORDER — AMLODIPINE BESYLATE 10 MG PO TABS: 1.0000 | ORAL_TABLET | Freq: Every day | ORAL | 2 refills | Status: AC

## 2020-06-11 NOTE — Patient Instructions (Signed)

## 2020-06-12 DIAGNOSIS — F0280 Dementia in other diseases classified elsewhere without behavioral disturbance: Secondary | ICD-10-CM | POA: Diagnosis not present

## 2020-06-12 DIAGNOSIS — I5032 Chronic diastolic (congestive) heart failure: Secondary | ICD-10-CM | POA: Diagnosis not present

## 2020-06-12 DIAGNOSIS — G309 Alzheimer's disease, unspecified: Secondary | ICD-10-CM | POA: Diagnosis not present

## 2020-06-12 DIAGNOSIS — N183 Chronic kidney disease, stage 3 unspecified: Secondary | ICD-10-CM | POA: Diagnosis not present

## 2020-06-12 DIAGNOSIS — I672 Cerebral atherosclerosis: Secondary | ICD-10-CM | POA: Diagnosis not present

## 2020-06-12 DIAGNOSIS — I130 Hypertensive heart and chronic kidney disease with heart failure and stage 1 through stage 4 chronic kidney disease, or unspecified chronic kidney disease: Secondary | ICD-10-CM | POA: Diagnosis not present

## 2020-06-13 DIAGNOSIS — F0280 Dementia in other diseases classified elsewhere without behavioral disturbance: Secondary | ICD-10-CM | POA: Diagnosis not present

## 2020-06-13 DIAGNOSIS — I130 Hypertensive heart and chronic kidney disease with heart failure and stage 1 through stage 4 chronic kidney disease, or unspecified chronic kidney disease: Secondary | ICD-10-CM | POA: Diagnosis not present

## 2020-06-13 DIAGNOSIS — G309 Alzheimer's disease, unspecified: Secondary | ICD-10-CM | POA: Diagnosis not present

## 2020-06-13 DIAGNOSIS — N183 Chronic kidney disease, stage 3 unspecified: Secondary | ICD-10-CM | POA: Diagnosis not present

## 2020-06-13 DIAGNOSIS — I5032 Chronic diastolic (congestive) heart failure: Secondary | ICD-10-CM | POA: Diagnosis not present

## 2020-06-13 DIAGNOSIS — I672 Cerebral atherosclerosis: Secondary | ICD-10-CM | POA: Diagnosis not present

## 2020-06-14 DIAGNOSIS — G309 Alzheimer's disease, unspecified: Secondary | ICD-10-CM | POA: Diagnosis not present

## 2020-06-14 DIAGNOSIS — I130 Hypertensive heart and chronic kidney disease with heart failure and stage 1 through stage 4 chronic kidney disease, or unspecified chronic kidney disease: Secondary | ICD-10-CM | POA: Diagnosis not present

## 2020-06-14 DIAGNOSIS — N183 Chronic kidney disease, stage 3 unspecified: Secondary | ICD-10-CM | POA: Diagnosis not present

## 2020-06-14 DIAGNOSIS — I672 Cerebral atherosclerosis: Secondary | ICD-10-CM | POA: Diagnosis not present

## 2020-06-14 DIAGNOSIS — I5032 Chronic diastolic (congestive) heart failure: Secondary | ICD-10-CM | POA: Diagnosis not present

## 2020-06-14 DIAGNOSIS — F0280 Dementia in other diseases classified elsewhere without behavioral disturbance: Secondary | ICD-10-CM | POA: Diagnosis not present

## 2020-06-15 DIAGNOSIS — N183 Chronic kidney disease, stage 3 unspecified: Secondary | ICD-10-CM | POA: Diagnosis not present

## 2020-06-15 DIAGNOSIS — F0280 Dementia in other diseases classified elsewhere without behavioral disturbance: Secondary | ICD-10-CM | POA: Diagnosis not present

## 2020-06-15 DIAGNOSIS — I672 Cerebral atherosclerosis: Secondary | ICD-10-CM | POA: Diagnosis not present

## 2020-06-15 DIAGNOSIS — I130 Hypertensive heart and chronic kidney disease with heart failure and stage 1 through stage 4 chronic kidney disease, or unspecified chronic kidney disease: Secondary | ICD-10-CM | POA: Diagnosis not present

## 2020-06-15 DIAGNOSIS — I5032 Chronic diastolic (congestive) heart failure: Secondary | ICD-10-CM | POA: Diagnosis not present

## 2020-06-15 DIAGNOSIS — G309 Alzheimer's disease, unspecified: Secondary | ICD-10-CM | POA: Diagnosis not present

## 2020-06-18 ENCOUNTER — Other Ambulatory Visit: Payer: Self-pay

## 2020-06-18 DIAGNOSIS — N183 Chronic kidney disease, stage 3 unspecified: Secondary | ICD-10-CM | POA: Diagnosis not present

## 2020-06-18 DIAGNOSIS — G309 Alzheimer's disease, unspecified: Secondary | ICD-10-CM | POA: Diagnosis not present

## 2020-06-18 DIAGNOSIS — F0280 Dementia in other diseases classified elsewhere without behavioral disturbance: Secondary | ICD-10-CM | POA: Diagnosis not present

## 2020-06-18 DIAGNOSIS — I672 Cerebral atherosclerosis: Secondary | ICD-10-CM | POA: Diagnosis not present

## 2020-06-18 DIAGNOSIS — I130 Hypertensive heart and chronic kidney disease with heart failure and stage 1 through stage 4 chronic kidney disease, or unspecified chronic kidney disease: Secondary | ICD-10-CM | POA: Diagnosis not present

## 2020-06-18 DIAGNOSIS — I5032 Chronic diastolic (congestive) heart failure: Secondary | ICD-10-CM | POA: Diagnosis not present

## 2020-06-18 MED ORDER — ATORVASTATIN CALCIUM 10 MG PO TABS: 10.0000 mg | ORAL_TABLET | Freq: Every day | ORAL | 3 refills | Status: AC

## 2020-06-19 DIAGNOSIS — I5032 Chronic diastolic (congestive) heart failure: Secondary | ICD-10-CM | POA: Diagnosis not present

## 2020-06-19 DIAGNOSIS — I672 Cerebral atherosclerosis: Secondary | ICD-10-CM | POA: Diagnosis not present

## 2020-06-19 DIAGNOSIS — G309 Alzheimer's disease, unspecified: Secondary | ICD-10-CM | POA: Diagnosis not present

## 2020-06-19 DIAGNOSIS — N183 Chronic kidney disease, stage 3 unspecified: Secondary | ICD-10-CM | POA: Diagnosis not present

## 2020-06-19 DIAGNOSIS — F0280 Dementia in other diseases classified elsewhere without behavioral disturbance: Secondary | ICD-10-CM | POA: Diagnosis not present

## 2020-06-19 DIAGNOSIS — I130 Hypertensive heart and chronic kidney disease with heart failure and stage 1 through stage 4 chronic kidney disease, or unspecified chronic kidney disease: Secondary | ICD-10-CM | POA: Diagnosis not present

## 2020-06-20 DIAGNOSIS — I672 Cerebral atherosclerosis: Secondary | ICD-10-CM | POA: Diagnosis not present

## 2020-06-20 DIAGNOSIS — G309 Alzheimer's disease, unspecified: Secondary | ICD-10-CM | POA: Diagnosis not present

## 2020-06-20 DIAGNOSIS — I5032 Chronic diastolic (congestive) heart failure: Secondary | ICD-10-CM | POA: Diagnosis not present

## 2020-06-20 DIAGNOSIS — F0280 Dementia in other diseases classified elsewhere without behavioral disturbance: Secondary | ICD-10-CM | POA: Diagnosis not present

## 2020-06-20 DIAGNOSIS — N183 Chronic kidney disease, stage 3 unspecified: Secondary | ICD-10-CM | POA: Diagnosis not present

## 2020-06-20 DIAGNOSIS — I130 Hypertensive heart and chronic kidney disease with heart failure and stage 1 through stage 4 chronic kidney disease, or unspecified chronic kidney disease: Secondary | ICD-10-CM | POA: Diagnosis not present

## 2020-06-21 DIAGNOSIS — D6869 Other thrombophilia: Secondary | ICD-10-CM | POA: Diagnosis not present

## 2020-06-21 DIAGNOSIS — N183 Chronic kidney disease, stage 3 unspecified: Secondary | ICD-10-CM | POA: Diagnosis not present

## 2020-06-21 DIAGNOSIS — I5032 Chronic diastolic (congestive) heart failure: Secondary | ICD-10-CM | POA: Diagnosis not present

## 2020-06-21 DIAGNOSIS — Z0000 Encounter for general adult medical examination without abnormal findings: Secondary | ICD-10-CM | POA: Diagnosis not present

## 2020-06-21 DIAGNOSIS — F0390 Unspecified dementia without behavioral disturbance: Secondary | ICD-10-CM | POA: Diagnosis not present

## 2020-06-21 DIAGNOSIS — I10 Essential (primary) hypertension: Secondary | ICD-10-CM | POA: Diagnosis not present

## 2020-06-21 DIAGNOSIS — I509 Heart failure, unspecified: Secondary | ICD-10-CM | POA: Diagnosis not present

## 2020-06-21 DIAGNOSIS — I672 Cerebral atherosclerosis: Secondary | ICD-10-CM | POA: Diagnosis not present

## 2020-06-21 DIAGNOSIS — I130 Hypertensive heart and chronic kidney disease with heart failure and stage 1 through stage 4 chronic kidney disease, or unspecified chronic kidney disease: Secondary | ICD-10-CM | POA: Diagnosis not present

## 2020-06-21 DIAGNOSIS — F0280 Dementia in other diseases classified elsewhere without behavioral disturbance: Secondary | ICD-10-CM | POA: Diagnosis not present

## 2020-06-21 DIAGNOSIS — G309 Alzheimer's disease, unspecified: Secondary | ICD-10-CM | POA: Diagnosis not present

## 2020-06-21 DIAGNOSIS — Z1389 Encounter for screening for other disorder: Secondary | ICD-10-CM | POA: Diagnosis not present

## 2020-06-21 DIAGNOSIS — G459 Transient cerebral ischemic attack, unspecified: Secondary | ICD-10-CM | POA: Diagnosis not present

## 2020-06-21 DIAGNOSIS — I4891 Unspecified atrial fibrillation: Secondary | ICD-10-CM | POA: Diagnosis not present

## 2020-06-22 DIAGNOSIS — I130 Hypertensive heart and chronic kidney disease with heart failure and stage 1 through stage 4 chronic kidney disease, or unspecified chronic kidney disease: Secondary | ICD-10-CM | POA: Diagnosis not present

## 2020-06-22 DIAGNOSIS — F0280 Dementia in other diseases classified elsewhere without behavioral disturbance: Secondary | ICD-10-CM | POA: Diagnosis not present

## 2020-06-22 DIAGNOSIS — N183 Chronic kidney disease, stage 3 unspecified: Secondary | ICD-10-CM | POA: Diagnosis not present

## 2020-06-22 DIAGNOSIS — I672 Cerebral atherosclerosis: Secondary | ICD-10-CM | POA: Diagnosis not present

## 2020-06-22 DIAGNOSIS — G309 Alzheimer's disease, unspecified: Secondary | ICD-10-CM | POA: Diagnosis not present

## 2020-06-22 DIAGNOSIS — I5032 Chronic diastolic (congestive) heart failure: Secondary | ICD-10-CM | POA: Diagnosis not present

## 2020-06-25 DIAGNOSIS — F0280 Dementia in other diseases classified elsewhere without behavioral disturbance: Secondary | ICD-10-CM | POA: Diagnosis not present

## 2020-06-25 DIAGNOSIS — N183 Chronic kidney disease, stage 3 unspecified: Secondary | ICD-10-CM | POA: Diagnosis not present

## 2020-06-25 DIAGNOSIS — G309 Alzheimer's disease, unspecified: Secondary | ICD-10-CM | POA: Diagnosis not present

## 2020-06-25 DIAGNOSIS — I5032 Chronic diastolic (congestive) heart failure: Secondary | ICD-10-CM | POA: Diagnosis not present

## 2020-06-25 DIAGNOSIS — I672 Cerebral atherosclerosis: Secondary | ICD-10-CM | POA: Diagnosis not present

## 2020-06-25 DIAGNOSIS — I130 Hypertensive heart and chronic kidney disease with heart failure and stage 1 through stage 4 chronic kidney disease, or unspecified chronic kidney disease: Secondary | ICD-10-CM | POA: Diagnosis not present

## 2020-06-26 DIAGNOSIS — I130 Hypertensive heart and chronic kidney disease with heart failure and stage 1 through stage 4 chronic kidney disease, or unspecified chronic kidney disease: Secondary | ICD-10-CM | POA: Diagnosis not present

## 2020-06-26 DIAGNOSIS — N183 Chronic kidney disease, stage 3 unspecified: Secondary | ICD-10-CM | POA: Diagnosis not present

## 2020-06-26 DIAGNOSIS — I672 Cerebral atherosclerosis: Secondary | ICD-10-CM | POA: Diagnosis not present

## 2020-06-26 DIAGNOSIS — F0280 Dementia in other diseases classified elsewhere without behavioral disturbance: Secondary | ICD-10-CM | POA: Diagnosis not present

## 2020-06-26 DIAGNOSIS — G309 Alzheimer's disease, unspecified: Secondary | ICD-10-CM | POA: Diagnosis not present

## 2020-06-26 DIAGNOSIS — I5032 Chronic diastolic (congestive) heart failure: Secondary | ICD-10-CM | POA: Diagnosis not present

## 2020-06-27 ENCOUNTER — Other Ambulatory Visit: Payer: Self-pay

## 2020-06-27 ENCOUNTER — Encounter: Payer: Self-pay | Admitting: Neurology

## 2020-06-27 ENCOUNTER — Ambulatory Visit (INDEPENDENT_AMBULATORY_CARE_PROVIDER_SITE_OTHER): Payer: Medicare Other | Admitting: Neurology

## 2020-06-27 VITALS — BP 151/84 | HR 65 | Ht 62.0 in | Wt 115.0 lb

## 2020-06-27 DIAGNOSIS — I5032 Chronic diastolic (congestive) heart failure: Secondary | ICD-10-CM | POA: Diagnosis not present

## 2020-06-27 DIAGNOSIS — I672 Cerebral atherosclerosis: Secondary | ICD-10-CM | POA: Diagnosis not present

## 2020-06-27 DIAGNOSIS — N183 Chronic kidney disease, stage 3 unspecified: Secondary | ICD-10-CM | POA: Diagnosis not present

## 2020-06-27 DIAGNOSIS — R519 Headache, unspecified: Secondary | ICD-10-CM | POA: Diagnosis not present

## 2020-06-27 DIAGNOSIS — G309 Alzheimer's disease, unspecified: Secondary | ICD-10-CM

## 2020-06-27 DIAGNOSIS — I130 Hypertensive heart and chronic kidney disease with heart failure and stage 1 through stage 4 chronic kidney disease, or unspecified chronic kidney disease: Secondary | ICD-10-CM | POA: Diagnosis not present

## 2020-06-27 DIAGNOSIS — F0280 Dementia in other diseases classified elsewhere without behavioral disturbance: Secondary | ICD-10-CM | POA: Diagnosis not present

## 2020-06-27 NOTE — Progress Notes (Signed)
I have read the note, and I agree with the clinical assessment and plan.  Lakelyn Straus K Ceferino Lang   

## 2020-06-27 NOTE — Progress Notes (Signed)
PATIENT: Donna [name] DOB: 25-Aug-1935  REASON FOR VISIT: follow up HISTORY FROM: patient Primary Neurologist: Dr. Jannifer Franklin  Today 06/27/20 Donna [name] is a 85 year old female with history of Alzheimer's disease. Saw Janett Billow  02/09/20 for hospital follow-up.  She presented to [facility] 01/08/20 with garbled speech that was completely resolved PTA.  Stroke work-up was unremarkable, no evidence of acute stroke.  Question medication noncompliance.  Questionable left hemispheric TIA cardioembolic etiology from A. fib with questionable Eliquis compliance. Has a sitter in the afternoon, helps her move around. Medication compliance has improved. Has had a few falls, before sitter was in place, none recently. Decided against MRI for evaluation of pancreatic cyst. Sleeping well, eating well. At home requires assistance with ADLs. Had decline prior to in home care from not getting medication and staying in the bed. Denies any headaches. No issues of agitation. Has Hospice set up since March 2022. Here today with daughter, [name] (lives in [place]). Her husband has poor health himself.   HISTORY  06/27/2019 Dr. Jannifer Franklin: Donna [name] is an 85 year old right-handed black female with a history of a progressive dementia consistent with Alzheimer's disease.  She has had headaches previously, but over the last year or so, she is no longer having any headaches whatsoever.  She comes in with her husband, her husband indicates that the patient has gone off of the gabapentin, and he does not believe that she is on the Namzaric anymore for memory as it was not helping.  The patient lives at home, the daughter will come in 3 times a week to help out with bathing.  The patient can dress herself if her clothes are placed out.  She is not having any hallucinations, she sleeps well at night and she eats and drinks well.  She is not having any agitation.  The patient cannot find her way around her house, she gets confused as to where things  are.  REVIEW OF SYSTEMS: Out of a complete 14 system review of symptoms, the patient complains only of the following symptoms, and all other reviewed systems are negative.  See HPI  ALLERGIES: Allergies  Allergen Reactions   Tagamet [Cimetidine] Hives   Diovan [Valsartan] Other (See Comments)    Hair loss    HOME MEDICATIONS: Outpatient Medications Prior to Visit  Medication Sig Dispense Refill   amLODipine (NORVASC) 10 MG tablet Take 1 tablet (10 mg total) by mouth daily. 90 tablet 2   apixaban (ELIQUIS) 2.5 MG TABS tablet Take 1 tablet (2.5 mg total) by mouth 2 (two) times daily. 60 tablet 6   atorvastatin (LIPITOR) 10 MG tablet Take 1 tablet (10 mg total) by mouth daily. 90 tablet 3   Cholecalciferol (VITAMIN D3 PO) Take 1 tablet by mouth daily.     dofetilide (TIKOSYN) 125 MCG capsule Take 3 capsules (375 mcg total) by mouth 2 (two) times daily. 540 capsule 2   fluticasone (FLONASE) 50 MCG/ACT nasal spray Place 1 spray into both nostrils daily as needed for allergies or rhinitis.     furosemide (LASIX) 20 MG tablet Take 1 tablet by mouth once daily 90 tablet 3   magnesium oxide (MAG-OX) 400 MG tablet Take 1 tablet (400 mg total) by mouth every morning. 90 tablet 3   metFORMIN (GLUCOPHAGE-XR) 500 MG 24 hr tablet Take 500 mg by mouth at bedtime.  6   metoprolol succinate (TOPROL XL) 25 MG 24 hr tablet Take 0.5 tablets (12.5 mg total) by mouth daily. 90 tablet  3   mirtazapine (REMERON) 30 MG tablet Take 30 mg by mouth at bedtime.     Multiple Vitamin (MULTIVITAMIN WITH MINERALS) TABS tablet Take 1 tablet by mouth daily.     NAMZARIC 28-10 MG CP24 Take 1 capsule by mouth at bedtime.     nitroGLYCERIN (NITROSTAT) 0.4 MG SL tablet Place 1 tablet (0.4 mg total) under the tongue every 5 (five) minutes as needed for chest pain. 25 tablet 3   potassium chloride SA (KLOR-CON M20) 20 MEQ tablet Take 1 tablet (20 mEq total) by mouth daily. 90 tablet 3   vitamin E 400 UNIT capsule Take 400  Units by mouth daily.     No facility-administered medications prior to visit.    PAST MEDICAL HISTORY: Past Medical History:  Diagnosis Date   Alzheimer disease ([place]) 06/27/2019   Atherosclerosis of renal artery (HCC)    a. s/p R RA stenting;  b. 04/2014 Renal Art duplex: Patent R RA stent, <60% L RA stenosis. Followed by [facility].   Carotid arterial disease ([place])    a. 01/2014 Carotid U/S: RICA 31%, LICA 54%. Followed by [facility].   CKD (chronic kidney disease), stage III (HCC)    Stage II/III   Essential hypertension    Fibrocystic breast    Fibromuscular dysplasia (HCC)    Hyperglycemia    Mitral regurgitation    a. Echo 08/2014: mild MR.   Paroxysmal atrial fibrillation ([place])    a. Dx 07/26/2014, CHA2DS2VASc = 5-->Eliquis;  c. 08/2014 Echo: EF 55-60%, no rwma, mildly dil LA/RA, mod-sev TR, PASP 39mmHg. b. s/p DCCV 08/2014. c. back in atrial flutter 09/2014 -> rate control pursued.   Paroxysmal atrial flutter ([place])    a. Dx 07/26/2014, CHA2DS2VASc = 5-->Eliquis;  c. 08/2014 Echo: EF 55-60%, no rwma, mildly dil LA/RA, mod-sev TR, PASP 56mmHg. b. s/p DCCV 08/2014. c. back in atrial flutter 09/2014 -> rate control pursued.   Tricuspid regurgitation    a. Echo 08/2014: mod-severe.    PAST SURGICAL HISTORY: Past Surgical History:  Procedure Laterality Date   CARDIOVERSION N/A 09/01/2014   Procedure: CARDIOVERSION;  Surgeon: Josue Hector, MD;  Location: [facility] ENDOSCOPY;  Service: Cardiovascular;  Laterality: N/A;   CARDIOVERSION N/A 11/23/2014   Procedure: CARDIOVERSION;  Surgeon: Skeet Latch, MD;  Location: [facility] ENDOSCOPY;  Service: Cardiovascular;  Laterality: N/A;   HEMORROIDECTOMY     REDUCTION MAMMAPLASTY Bilateral    RENAL ARTERY STENT  2008   Right renal artery by Dr. Drucie Opitz   TONSILLECTOMY     TUBAL LIGATION  [phone]    FAMILY HISTORY: Family History  Problem Relation Age of Onset   Stroke Mother 27   Diabetes Mother    Hypertension Mother    Aneurysm Father        abdominal  aortic aneurysm   Diabetes Father    Hypertension Father    Heart attack Father    Cancer Sister 71       breast cancer   Breast cancer Neg Hx     SOCIAL HISTORY: Social History   Socioeconomic History   Marital status: Married    Spouse name: Kaelani Kendrick   Number of children: 2   Years of education: college   Highest education level: Bachelor's degree (e.g., BA, AB, BS)  Occupational History   Occupation: Retired  Tobacco Use   Smoking status: Never   Smokeless tobacco: Never  Vaping Use   Vaping Use: Never used  Substance and Sexual Activity  Alcohol use: Yes   Drug use: No   Sexual activity: Yes    Birth control/protection: Surgical    Comment: BTL  Other Topics Concern   Not on file  Social History Narrative   Not on file   Social Determinants of Health   Financial Resource Strain: Low Risk    Difficulty of Paying Living Expenses: Not hard at all  Food Insecurity: No Food Insecurity   Worried About Charity fundraiser in the Last Year: Never true   Ran Out of Food in the Last Year: Never true  Transportation Needs: No Transportation Needs   Lack of Transportation (Medical): No   Lack of Transportation (Non-Medical): No  Physical Activity: Inactive   Days of Exercise per Week: 0 days   Minutes of Exercise per Session: 0 min  Stress: No Stress Concern Present   Feeling of Stress : Only a little  Social Connections: Engineer, building services of Communication with Friends and Family: More than three times a week   Frequency of Social Gatherings with Friends and Family: More than three times a week   Attends Religious Services: More than 4 times per year   Active Member of [employer] or Organizations: Yes   Attends Archivist Meetings: 1 to 4 times per year   Marital Status: Married  Human resources officer Violence: Not At Risk   Fear of Current or Ex-Partner: No   Emotionally Abused: No   Physically Abused: No   Sexually Abused: No   PHYSICAL  EXAM  Vitals:   06/27/20 1303  BP: (!) 151/84  Pulse: 65  Weight: 115 lb (52.2 kg)  Height: 5\' 2"  (1.575 m)   Body mass index is 21.03 kg/m.  Generalized: Well developed, in no acute distress, frail elderly looking female Neurological examination  Mentation: Alert, history is provided by her daughter, she is disoriented, follows exam commands fairly well, speech clear Cranial nerve II-XII: Pupils were equal round reactive to light. Extraocular movements were full, visual field were full on confrontational test. Facial sensation and strength were normal. Head turning and shoulder shrug  were normal and symmetric. Motor: Good strength all extremities Sensory: Sensory testing is intact to soft touch on all 4 extremities. No evidence of extinction is noted.  Coordination: Apraxia with these commands Gait and station: In wheelchair, slow to rise from seated position with pushoff, requires examiner assistance, gait is wide-based, cautious, stooped posture  DIAGNOSTIC DATA (LABS, IMAGING, TESTING) - I reviewed patient records, labs, notes, testing and imaging myself where available.  Lab Results  Component Value Date   WBC 8.0 04/08/2020   HGB 13.9 04/08/2020   HCT 41.0 04/08/2020   MCV 97.7 04/08/2020   PLT 238 04/08/2020      Component Value Date/Time   NA 141 04/08/2020 0424   NA 144 09/27/2019 1210   K 4.3 04/08/2020 0424   CL 120 (H) 04/08/2020 0424   CO2 17 (L) 04/08/2020 0257   GLUCOSE 117 (H) 04/08/2020 0424   BUN 32 (H) 04/08/2020 0424   BUN 18 09/27/2019 1210   CREATININE 1.20 (H) 04/08/2020 0424   CREATININE 1.06 (H) 07/05/2015 1148   CALCIUM 10.2 04/08/2020 0257   PROT 7.5 01/08/2020 1838   PROT 7.4 05/03/2019 1116   ALBUMIN 4.0 01/08/2020 1838   ALBUMIN 4.5 05/03/2019 1116   AST 28 01/08/2020 1838   ALT 24 01/08/2020 1838   ALKPHOS 65 01/08/2020 1838   BILITOT 0.6 01/08/2020 1838  BILITOT 0.5 05/03/2019 1116   GFRNONAA 40 (L) 04/08/2020 0257   GFRAA 59  (L) 09/27/2019 1210   Lab Results  Component Value Date   CHOL 138 01/09/2020   HDL 45 01/09/2020   LDLCALC 75 01/09/2020   TRIG 92 01/09/2020   CHOLHDL 3.1 01/09/2020   Lab Results  Component Value Date   HGBA1C 6.1 (H) 01/09/2020   No results found for: [id number] Lab Results  Component Value Date   TSH 0.443 11/19/2014    ASSESSMENT AND PLAN 85 y.o. year old female  has a past medical history of Alzheimer disease ([place]) (06/27/2019), Atherosclerosis of renal artery ([place]), Carotid arterial disease ([place]), CKD (chronic kidney disease), stage III ([place]), Essential hypertension, Fibrocystic breast, Fibromuscular dysplasia ([place]), Hyperglycemia, Mitral regurgitation, Paroxysmal atrial fibrillation ([place]), Paroxysmal atrial flutter ([place]), and Tricuspid regurgitation. here with:  Dementia, Alzheimer's Disease History of headache, resolved TIA event in Feb 2022  -Decline in memory, couldn't complete MMSE, is disoriented, but is agreeable and cooperative today -Not receiving any medications from this office, but remains on Namzaric from PCP, discussed with her daughter, probably reasonable to stop this, but can continue if tolerating  -Has an in-home sitter, is under hospice care at this point  -Wishes to continue to follow-up at our office, will return in 1 year or sooner if needed, her headaches have resolved  Butler Denmark, AGNP-C, DNP 06/27/2020, 1:24 PM [facility] [address], [place], [place] [address] [phone]

## 2020-06-27 NOTE — Patient Instructions (Signed)
Continue current medications See you back in 1 year  Continue to see your primary doctor

## 2020-06-28 DIAGNOSIS — I672 Cerebral atherosclerosis: Secondary | ICD-10-CM | POA: Diagnosis not present

## 2020-06-28 DIAGNOSIS — F0280 Dementia in other diseases classified elsewhere without behavioral disturbance: Secondary | ICD-10-CM | POA: Diagnosis not present

## 2020-06-28 DIAGNOSIS — G309 Alzheimer's disease, unspecified: Secondary | ICD-10-CM | POA: Diagnosis not present

## 2020-06-28 DIAGNOSIS — N183 Chronic kidney disease, stage 3 unspecified: Secondary | ICD-10-CM | POA: Diagnosis not present

## 2020-06-28 DIAGNOSIS — I5032 Chronic diastolic (congestive) heart failure: Secondary | ICD-10-CM | POA: Diagnosis not present

## 2020-06-28 DIAGNOSIS — I130 Hypertensive heart and chronic kidney disease with heart failure and stage 1 through stage 4 chronic kidney disease, or unspecified chronic kidney disease: Secondary | ICD-10-CM | POA: Diagnosis not present

## 2020-06-29 DIAGNOSIS — G309 Alzheimer's disease, unspecified: Secondary | ICD-10-CM | POA: Diagnosis not present

## 2020-06-29 DIAGNOSIS — I5032 Chronic diastolic (congestive) heart failure: Secondary | ICD-10-CM | POA: Diagnosis not present

## 2020-06-29 DIAGNOSIS — N183 Chronic kidney disease, stage 3 unspecified: Secondary | ICD-10-CM | POA: Diagnosis not present

## 2020-06-29 DIAGNOSIS — F0280 Dementia in other diseases classified elsewhere without behavioral disturbance: Secondary | ICD-10-CM | POA: Diagnosis not present

## 2020-06-29 DIAGNOSIS — I672 Cerebral atherosclerosis: Secondary | ICD-10-CM | POA: Diagnosis not present

## 2020-06-29 DIAGNOSIS — I130 Hypertensive heart and chronic kidney disease with heart failure and stage 1 through stage 4 chronic kidney disease, or unspecified chronic kidney disease: Secondary | ICD-10-CM | POA: Diagnosis not present

## 2020-07-02 DIAGNOSIS — G309 Alzheimer's disease, unspecified: Secondary | ICD-10-CM | POA: Diagnosis not present

## 2020-07-02 DIAGNOSIS — F0280 Dementia in other diseases classified elsewhere without behavioral disturbance: Secondary | ICD-10-CM | POA: Diagnosis not present

## 2020-07-02 DIAGNOSIS — I672 Cerebral atherosclerosis: Secondary | ICD-10-CM | POA: Diagnosis not present

## 2020-07-02 DIAGNOSIS — N183 Chronic kidney disease, stage 3 unspecified: Secondary | ICD-10-CM | POA: Diagnosis not present

## 2020-07-02 DIAGNOSIS — I5032 Chronic diastolic (congestive) heart failure: Secondary | ICD-10-CM | POA: Diagnosis not present

## 2020-07-02 DIAGNOSIS — I130 Hypertensive heart and chronic kidney disease with heart failure and stage 1 through stage 4 chronic kidney disease, or unspecified chronic kidney disease: Secondary | ICD-10-CM | POA: Diagnosis not present

## 2020-07-03 DIAGNOSIS — G309 Alzheimer's disease, unspecified: Secondary | ICD-10-CM | POA: Diagnosis not present

## 2020-07-03 DIAGNOSIS — I5032 Chronic diastolic (congestive) heart failure: Secondary | ICD-10-CM | POA: Diagnosis not present

## 2020-07-03 DIAGNOSIS — I130 Hypertensive heart and chronic kidney disease with heart failure and stage 1 through stage 4 chronic kidney disease, or unspecified chronic kidney disease: Secondary | ICD-10-CM | POA: Diagnosis not present

## 2020-07-03 DIAGNOSIS — N183 Chronic kidney disease, stage 3 unspecified: Secondary | ICD-10-CM | POA: Diagnosis not present

## 2020-07-03 DIAGNOSIS — I672 Cerebral atherosclerosis: Secondary | ICD-10-CM | POA: Diagnosis not present

## 2020-07-03 DIAGNOSIS — F0280 Dementia in other diseases classified elsewhere without behavioral disturbance: Secondary | ICD-10-CM | POA: Diagnosis not present

## 2020-07-04 DIAGNOSIS — I5032 Chronic diastolic (congestive) heart failure: Secondary | ICD-10-CM | POA: Diagnosis not present

## 2020-07-04 DIAGNOSIS — I130 Hypertensive heart and chronic kidney disease with heart failure and stage 1 through stage 4 chronic kidney disease, or unspecified chronic kidney disease: Secondary | ICD-10-CM | POA: Diagnosis not present

## 2020-07-04 DIAGNOSIS — I509 Heart failure, unspecified: Secondary | ICD-10-CM | POA: Diagnosis not present

## 2020-07-04 DIAGNOSIS — F0280 Dementia in other diseases classified elsewhere without behavioral disturbance: Secondary | ICD-10-CM | POA: Diagnosis not present

## 2020-07-04 DIAGNOSIS — I701 Atherosclerosis of renal artery: Secondary | ICD-10-CM | POA: Diagnosis not present

## 2020-07-04 DIAGNOSIS — G309 Alzheimer's disease, unspecified: Secondary | ICD-10-CM | POA: Diagnosis not present

## 2020-07-04 DIAGNOSIS — I4891 Unspecified atrial fibrillation: Secondary | ICD-10-CM | POA: Diagnosis not present

## 2020-07-04 DIAGNOSIS — F341 Dysthymic disorder: Secondary | ICD-10-CM | POA: Diagnosis not present

## 2020-07-04 DIAGNOSIS — G459 Transient cerebral ischemic attack, unspecified: Secondary | ICD-10-CM | POA: Diagnosis not present

## 2020-07-04 DIAGNOSIS — I672 Cerebral atherosclerosis: Secondary | ICD-10-CM | POA: Diagnosis not present

## 2020-07-04 DIAGNOSIS — E782 Mixed hyperlipidemia: Secondary | ICD-10-CM | POA: Diagnosis not present

## 2020-07-04 DIAGNOSIS — E119 Type 2 diabetes mellitus without complications: Secondary | ICD-10-CM | POA: Diagnosis not present

## 2020-07-04 DIAGNOSIS — N183 Chronic kidney disease, stage 3 unspecified: Secondary | ICD-10-CM | POA: Diagnosis not present

## 2020-07-04 DIAGNOSIS — F0390 Unspecified dementia without behavioral disturbance: Secondary | ICD-10-CM | POA: Diagnosis not present

## 2020-07-04 DIAGNOSIS — I10 Essential (primary) hypertension: Secondary | ICD-10-CM | POA: Diagnosis not present

## 2020-07-05 DIAGNOSIS — F0280 Dementia in other diseases classified elsewhere without behavioral disturbance: Secondary | ICD-10-CM | POA: Diagnosis not present

## 2020-07-05 DIAGNOSIS — I672 Cerebral atherosclerosis: Secondary | ICD-10-CM | POA: Diagnosis not present

## 2020-07-05 DIAGNOSIS — I5032 Chronic diastolic (congestive) heart failure: Secondary | ICD-10-CM | POA: Diagnosis not present

## 2020-07-05 DIAGNOSIS — N183 Chronic kidney disease, stage 3 unspecified: Secondary | ICD-10-CM | POA: Diagnosis not present

## 2020-07-05 DIAGNOSIS — I130 Hypertensive heart and chronic kidney disease with heart failure and stage 1 through stage 4 chronic kidney disease, or unspecified chronic kidney disease: Secondary | ICD-10-CM | POA: Diagnosis not present

## 2020-07-05 DIAGNOSIS — G309 Alzheimer's disease, unspecified: Secondary | ICD-10-CM | POA: Diagnosis not present

## 2020-07-06 DIAGNOSIS — N6019 Diffuse cystic mastopathy of unspecified breast: Secondary | ICD-10-CM | POA: Diagnosis not present

## 2020-07-06 DIAGNOSIS — I5032 Chronic diastolic (congestive) heart failure: Secondary | ICD-10-CM | POA: Diagnosis not present

## 2020-07-06 DIAGNOSIS — I081 Rheumatic disorders of both mitral and tricuspid valves: Secondary | ICD-10-CM | POA: Diagnosis not present

## 2020-07-06 DIAGNOSIS — I130 Hypertensive heart and chronic kidney disease with heart failure and stage 1 through stage 4 chronic kidney disease, or unspecified chronic kidney disease: Secondary | ICD-10-CM | POA: Diagnosis not present

## 2020-07-06 DIAGNOSIS — F0280 Dementia in other diseases classified elsewhere without behavioral disturbance: Secondary | ICD-10-CM | POA: Diagnosis not present

## 2020-07-06 DIAGNOSIS — Z8673 Personal history of transient ischemic attack (TIA), and cerebral infarction without residual deficits: Secondary | ICD-10-CM | POA: Diagnosis not present

## 2020-07-06 DIAGNOSIS — I701 Atherosclerosis of renal artery: Secondary | ICD-10-CM | POA: Diagnosis not present

## 2020-07-06 DIAGNOSIS — I672 Cerebral atherosclerosis: Secondary | ICD-10-CM | POA: Diagnosis not present

## 2020-07-06 DIAGNOSIS — Z7901 Long term (current) use of anticoagulants: Secondary | ICD-10-CM | POA: Diagnosis not present

## 2020-07-06 DIAGNOSIS — I480 Paroxysmal atrial fibrillation: Secondary | ICD-10-CM | POA: Diagnosis not present

## 2020-07-06 DIAGNOSIS — N183 Chronic kidney disease, stage 3 unspecified: Secondary | ICD-10-CM | POA: Diagnosis not present

## 2020-07-06 DIAGNOSIS — G309 Alzheimer's disease, unspecified: Secondary | ICD-10-CM | POA: Diagnosis not present

## 2020-07-06 DIAGNOSIS — R32 Unspecified urinary incontinence: Secondary | ICD-10-CM | POA: Diagnosis not present

## 2020-07-06 DIAGNOSIS — I773 Arterial fibromuscular dysplasia: Secondary | ICD-10-CM | POA: Diagnosis not present

## 2020-07-09 DIAGNOSIS — G309 Alzheimer's disease, unspecified: Secondary | ICD-10-CM | POA: Diagnosis not present

## 2020-07-09 DIAGNOSIS — I672 Cerebral atherosclerosis: Secondary | ICD-10-CM | POA: Diagnosis not present

## 2020-07-09 DIAGNOSIS — I130 Hypertensive heart and chronic kidney disease with heart failure and stage 1 through stage 4 chronic kidney disease, or unspecified chronic kidney disease: Secondary | ICD-10-CM | POA: Diagnosis not present

## 2020-07-09 DIAGNOSIS — N183 Chronic kidney disease, stage 3 unspecified: Secondary | ICD-10-CM | POA: Diagnosis not present

## 2020-07-09 DIAGNOSIS — F0280 Dementia in other diseases classified elsewhere without behavioral disturbance: Secondary | ICD-10-CM | POA: Diagnosis not present

## 2020-07-09 DIAGNOSIS — I5032 Chronic diastolic (congestive) heart failure: Secondary | ICD-10-CM | POA: Diagnosis not present

## 2020-07-10 DIAGNOSIS — I5032 Chronic diastolic (congestive) heart failure: Secondary | ICD-10-CM | POA: Diagnosis not present

## 2020-07-10 DIAGNOSIS — G309 Alzheimer's disease, unspecified: Secondary | ICD-10-CM | POA: Diagnosis not present

## 2020-07-10 DIAGNOSIS — F0280 Dementia in other diseases classified elsewhere without behavioral disturbance: Secondary | ICD-10-CM | POA: Diagnosis not present

## 2020-07-10 DIAGNOSIS — N183 Chronic kidney disease, stage 3 unspecified: Secondary | ICD-10-CM | POA: Diagnosis not present

## 2020-07-10 DIAGNOSIS — I672 Cerebral atherosclerosis: Secondary | ICD-10-CM | POA: Diagnosis not present

## 2020-07-10 DIAGNOSIS — I130 Hypertensive heart and chronic kidney disease with heart failure and stage 1 through stage 4 chronic kidney disease, or unspecified chronic kidney disease: Secondary | ICD-10-CM | POA: Diagnosis not present

## 2020-07-11 DIAGNOSIS — N183 Chronic kidney disease, stage 3 unspecified: Secondary | ICD-10-CM | POA: Diagnosis not present

## 2020-07-11 DIAGNOSIS — I130 Hypertensive heart and chronic kidney disease with heart failure and stage 1 through stage 4 chronic kidney disease, or unspecified chronic kidney disease: Secondary | ICD-10-CM | POA: Diagnosis not present

## 2020-07-11 DIAGNOSIS — G309 Alzheimer's disease, unspecified: Secondary | ICD-10-CM | POA: Diagnosis not present

## 2020-07-11 DIAGNOSIS — I5032 Chronic diastolic (congestive) heart failure: Secondary | ICD-10-CM | POA: Diagnosis not present

## 2020-07-11 DIAGNOSIS — I672 Cerebral atherosclerosis: Secondary | ICD-10-CM | POA: Diagnosis not present

## 2020-07-11 DIAGNOSIS — F0280 Dementia in other diseases classified elsewhere without behavioral disturbance: Secondary | ICD-10-CM | POA: Diagnosis not present

## 2020-07-12 DIAGNOSIS — N183 Chronic kidney disease, stage 3 unspecified: Secondary | ICD-10-CM | POA: Diagnosis not present

## 2020-07-12 DIAGNOSIS — F0280 Dementia in other diseases classified elsewhere without behavioral disturbance: Secondary | ICD-10-CM | POA: Diagnosis not present

## 2020-07-12 DIAGNOSIS — I672 Cerebral atherosclerosis: Secondary | ICD-10-CM | POA: Diagnosis not present

## 2020-07-12 DIAGNOSIS — G309 Alzheimer's disease, unspecified: Secondary | ICD-10-CM | POA: Diagnosis not present

## 2020-07-12 DIAGNOSIS — I130 Hypertensive heart and chronic kidney disease with heart failure and stage 1 through stage 4 chronic kidney disease, or unspecified chronic kidney disease: Secondary | ICD-10-CM | POA: Diagnosis not present

## 2020-07-12 DIAGNOSIS — I5032 Chronic diastolic (congestive) heart failure: Secondary | ICD-10-CM | POA: Diagnosis not present

## 2020-07-13 DIAGNOSIS — F0280 Dementia in other diseases classified elsewhere without behavioral disturbance: Secondary | ICD-10-CM | POA: Diagnosis not present

## 2020-07-13 DIAGNOSIS — I130 Hypertensive heart and chronic kidney disease with heart failure and stage 1 through stage 4 chronic kidney disease, or unspecified chronic kidney disease: Secondary | ICD-10-CM | POA: Diagnosis not present

## 2020-07-13 DIAGNOSIS — I672 Cerebral atherosclerosis: Secondary | ICD-10-CM | POA: Diagnosis not present

## 2020-07-13 DIAGNOSIS — N183 Chronic kidney disease, stage 3 unspecified: Secondary | ICD-10-CM | POA: Diagnosis not present

## 2020-07-13 DIAGNOSIS — I5032 Chronic diastolic (congestive) heart failure: Secondary | ICD-10-CM | POA: Diagnosis not present

## 2020-07-13 DIAGNOSIS — G309 Alzheimer's disease, unspecified: Secondary | ICD-10-CM | POA: Diagnosis not present

## 2020-07-17 DIAGNOSIS — I130 Hypertensive heart and chronic kidney disease with heart failure and stage 1 through stage 4 chronic kidney disease, or unspecified chronic kidney disease: Secondary | ICD-10-CM | POA: Diagnosis not present

## 2020-07-17 DIAGNOSIS — G309 Alzheimer's disease, unspecified: Secondary | ICD-10-CM | POA: Diagnosis not present

## 2020-07-17 DIAGNOSIS — I5032 Chronic diastolic (congestive) heart failure: Secondary | ICD-10-CM | POA: Diagnosis not present

## 2020-07-17 DIAGNOSIS — I672 Cerebral atherosclerosis: Secondary | ICD-10-CM | POA: Diagnosis not present

## 2020-07-17 DIAGNOSIS — Z20822 Contact with and (suspected) exposure to covid-19: Secondary | ICD-10-CM | POA: Diagnosis not present

## 2020-07-17 DIAGNOSIS — F0280 Dementia in other diseases classified elsewhere without behavioral disturbance: Secondary | ICD-10-CM | POA: Diagnosis not present

## 2020-07-17 DIAGNOSIS — N183 Chronic kidney disease, stage 3 unspecified: Secondary | ICD-10-CM | POA: Diagnosis not present

## 2020-07-18 DIAGNOSIS — I5032 Chronic diastolic (congestive) heart failure: Secondary | ICD-10-CM | POA: Diagnosis not present

## 2020-07-18 DIAGNOSIS — I130 Hypertensive heart and chronic kidney disease with heart failure and stage 1 through stage 4 chronic kidney disease, or unspecified chronic kidney disease: Secondary | ICD-10-CM | POA: Diagnosis not present

## 2020-07-18 DIAGNOSIS — N183 Chronic kidney disease, stage 3 unspecified: Secondary | ICD-10-CM | POA: Diagnosis not present

## 2020-07-18 DIAGNOSIS — I672 Cerebral atherosclerosis: Secondary | ICD-10-CM | POA: Diagnosis not present

## 2020-07-18 DIAGNOSIS — G309 Alzheimer's disease, unspecified: Secondary | ICD-10-CM | POA: Diagnosis not present

## 2020-07-18 DIAGNOSIS — F0280 Dementia in other diseases classified elsewhere without behavioral disturbance: Secondary | ICD-10-CM | POA: Diagnosis not present

## 2020-07-19 DIAGNOSIS — G309 Alzheimer's disease, unspecified: Secondary | ICD-10-CM | POA: Diagnosis not present

## 2020-07-19 DIAGNOSIS — N183 Chronic kidney disease, stage 3 unspecified: Secondary | ICD-10-CM | POA: Diagnosis not present

## 2020-07-19 DIAGNOSIS — I5032 Chronic diastolic (congestive) heart failure: Secondary | ICD-10-CM | POA: Diagnosis not present

## 2020-07-19 DIAGNOSIS — F0280 Dementia in other diseases classified elsewhere without behavioral disturbance: Secondary | ICD-10-CM | POA: Diagnosis not present

## 2020-07-19 DIAGNOSIS — I672 Cerebral atherosclerosis: Secondary | ICD-10-CM | POA: Diagnosis not present

## 2020-07-19 DIAGNOSIS — I130 Hypertensive heart and chronic kidney disease with heart failure and stage 1 through stage 4 chronic kidney disease, or unspecified chronic kidney disease: Secondary | ICD-10-CM | POA: Diagnosis not present

## 2020-07-20 DIAGNOSIS — G309 Alzheimer's disease, unspecified: Secondary | ICD-10-CM | POA: Diagnosis not present

## 2020-07-20 DIAGNOSIS — F0280 Dementia in other diseases classified elsewhere without behavioral disturbance: Secondary | ICD-10-CM | POA: Diagnosis not present

## 2020-07-20 DIAGNOSIS — I130 Hypertensive heart and chronic kidney disease with heart failure and stage 1 through stage 4 chronic kidney disease, or unspecified chronic kidney disease: Secondary | ICD-10-CM | POA: Diagnosis not present

## 2020-07-20 DIAGNOSIS — I5032 Chronic diastolic (congestive) heart failure: Secondary | ICD-10-CM | POA: Diagnosis not present

## 2020-07-20 DIAGNOSIS — N183 Chronic kidney disease, stage 3 unspecified: Secondary | ICD-10-CM | POA: Diagnosis not present

## 2020-07-20 DIAGNOSIS — I672 Cerebral atherosclerosis: Secondary | ICD-10-CM | POA: Diagnosis not present

## 2020-07-23 DIAGNOSIS — F0280 Dementia in other diseases classified elsewhere without behavioral disturbance: Secondary | ICD-10-CM | POA: Diagnosis not present

## 2020-07-23 DIAGNOSIS — I130 Hypertensive heart and chronic kidney disease with heart failure and stage 1 through stage 4 chronic kidney disease, or unspecified chronic kidney disease: Secondary | ICD-10-CM | POA: Diagnosis not present

## 2020-07-23 DIAGNOSIS — I672 Cerebral atherosclerosis: Secondary | ICD-10-CM | POA: Diagnosis not present

## 2020-07-23 DIAGNOSIS — I5032 Chronic diastolic (congestive) heart failure: Secondary | ICD-10-CM | POA: Diagnosis not present

## 2020-07-23 DIAGNOSIS — G309 Alzheimer's disease, unspecified: Secondary | ICD-10-CM | POA: Diagnosis not present

## 2020-07-23 DIAGNOSIS — N183 Chronic kidney disease, stage 3 unspecified: Secondary | ICD-10-CM | POA: Diagnosis not present

## 2020-07-24 DIAGNOSIS — I5032 Chronic diastolic (congestive) heart failure: Secondary | ICD-10-CM | POA: Diagnosis not present

## 2020-07-24 DIAGNOSIS — G309 Alzheimer's disease, unspecified: Secondary | ICD-10-CM | POA: Diagnosis not present

## 2020-07-24 DIAGNOSIS — F0280 Dementia in other diseases classified elsewhere without behavioral disturbance: Secondary | ICD-10-CM | POA: Diagnosis not present

## 2020-07-24 DIAGNOSIS — I130 Hypertensive heart and chronic kidney disease with heart failure and stage 1 through stage 4 chronic kidney disease, or unspecified chronic kidney disease: Secondary | ICD-10-CM | POA: Diagnosis not present

## 2020-07-24 DIAGNOSIS — N183 Chronic kidney disease, stage 3 unspecified: Secondary | ICD-10-CM | POA: Diagnosis not present

## 2020-07-24 DIAGNOSIS — I672 Cerebral atherosclerosis: Secondary | ICD-10-CM | POA: Diagnosis not present

## 2020-07-25 DIAGNOSIS — I672 Cerebral atherosclerosis: Secondary | ICD-10-CM | POA: Diagnosis not present

## 2020-07-25 DIAGNOSIS — I5032 Chronic diastolic (congestive) heart failure: Secondary | ICD-10-CM | POA: Diagnosis not present

## 2020-07-25 DIAGNOSIS — F0280 Dementia in other diseases classified elsewhere without behavioral disturbance: Secondary | ICD-10-CM | POA: Diagnosis not present

## 2020-07-25 DIAGNOSIS — N183 Chronic kidney disease, stage 3 unspecified: Secondary | ICD-10-CM | POA: Diagnosis not present

## 2020-07-25 DIAGNOSIS — G309 Alzheimer's disease, unspecified: Secondary | ICD-10-CM | POA: Diagnosis not present

## 2020-07-25 DIAGNOSIS — I130 Hypertensive heart and chronic kidney disease with heart failure and stage 1 through stage 4 chronic kidney disease, or unspecified chronic kidney disease: Secondary | ICD-10-CM | POA: Diagnosis not present

## 2020-07-26 DIAGNOSIS — F0280 Dementia in other diseases classified elsewhere without behavioral disturbance: Secondary | ICD-10-CM | POA: Diagnosis not present

## 2020-07-26 DIAGNOSIS — G309 Alzheimer's disease, unspecified: Secondary | ICD-10-CM | POA: Diagnosis not present

## 2020-07-26 DIAGNOSIS — I672 Cerebral atherosclerosis: Secondary | ICD-10-CM | POA: Diagnosis not present

## 2020-07-26 DIAGNOSIS — I5032 Chronic diastolic (congestive) heart failure: Secondary | ICD-10-CM | POA: Diagnosis not present

## 2020-07-26 DIAGNOSIS — I130 Hypertensive heart and chronic kidney disease with heart failure and stage 1 through stage 4 chronic kidney disease, or unspecified chronic kidney disease: Secondary | ICD-10-CM | POA: Diagnosis not present

## 2020-07-26 DIAGNOSIS — N183 Chronic kidney disease, stage 3 unspecified: Secondary | ICD-10-CM | POA: Diagnosis not present

## 2020-07-27 ENCOUNTER — Telehealth: Payer: Self-pay | Admitting: Podiatry

## 2020-07-27 ENCOUNTER — Ambulatory Visit (INDEPENDENT_AMBULATORY_CARE_PROVIDER_SITE_OTHER): Payer: Medicare Other

## 2020-07-27 ENCOUNTER — Ambulatory Visit (INDEPENDENT_AMBULATORY_CARE_PROVIDER_SITE_OTHER): Payer: Medicare Other | Admitting: Podiatry

## 2020-07-27 ENCOUNTER — Other Ambulatory Visit: Payer: Self-pay

## 2020-07-27 DIAGNOSIS — S99922A Unspecified injury of left foot, initial encounter: Secondary | ICD-10-CM | POA: Diagnosis not present

## 2020-07-27 DIAGNOSIS — I5032 Chronic diastolic (congestive) heart failure: Secondary | ICD-10-CM | POA: Diagnosis not present

## 2020-07-27 DIAGNOSIS — I672 Cerebral atherosclerosis: Secondary | ICD-10-CM | POA: Diagnosis not present

## 2020-07-27 DIAGNOSIS — I130 Hypertensive heart and chronic kidney disease with heart failure and stage 1 through stage 4 chronic kidney disease, or unspecified chronic kidney disease: Secondary | ICD-10-CM | POA: Diagnosis not present

## 2020-07-27 DIAGNOSIS — G309 Alzheimer's disease, unspecified: Secondary | ICD-10-CM | POA: Diagnosis not present

## 2020-07-27 DIAGNOSIS — N183 Chronic kidney disease, stage 3 unspecified: Secondary | ICD-10-CM | POA: Diagnosis not present

## 2020-07-27 DIAGNOSIS — S90219A Contusion of unspecified great toe with damage to nail, initial encounter: Secondary | ICD-10-CM | POA: Diagnosis not present

## 2020-07-27 DIAGNOSIS — F0280 Dementia in other diseases classified elsewhere without behavioral disturbance: Secondary | ICD-10-CM | POA: Diagnosis not present

## 2020-07-27 NOTE — Progress Notes (Signed)
  Subjective:  Patient ID: ABCDE SPEZIA, female    DOB: Nov 29, 1935,  MRN: [id number]  Chief Complaint  Patient presents with   Toe Injury    left great toe injury   85 y.o. female presents with the above complaint. History confirmed with patient. Unsure of what happened, unsure of when it happened. Does not have pain when she applies pressure to the toe.  Objective:  Physical Exam: warm, good capillary refill, nail exam subungual hematoma left hallux with minor nail bed abrasion, no laceration to the nail bed., no trophic changes or ulcerative lesions, normal DP and PT pulses, and normal sensory exam. No images are attached to the encounter.  Radiographs: X-ray of the left foot: no fracture, dislocation, swelling or degenerative changes noted Assessment:   1. Injury of great toe of left foot, initial encounter   2. Subungual hematoma of great toe    Plan:  Patient was evaluated and treated and all questions answered.  Contusion left great toe with subungual hematoma  -XR reviewed -Gently debrided nail, no evidence of deep nail bed laceration -Nail should regrow    No follow-ups on file.

## 2020-07-27 NOTE — Telephone Encounter (Signed)
Requesting a call back with update on patients condition.

## 2020-07-27 NOTE — Telephone Encounter (Signed)
Called and gave update on patient's mother's visit after confirming pt info with 2 identifiers

## 2020-07-30 DIAGNOSIS — N183 Chronic kidney disease, stage 3 unspecified: Secondary | ICD-10-CM | POA: Diagnosis not present

## 2020-07-30 DIAGNOSIS — F0280 Dementia in other diseases classified elsewhere without behavioral disturbance: Secondary | ICD-10-CM | POA: Diagnosis not present

## 2020-07-30 DIAGNOSIS — I5032 Chronic diastolic (congestive) heart failure: Secondary | ICD-10-CM | POA: Diagnosis not present

## 2020-07-30 DIAGNOSIS — I672 Cerebral atherosclerosis: Secondary | ICD-10-CM | POA: Diagnosis not present

## 2020-07-30 DIAGNOSIS — I130 Hypertensive heart and chronic kidney disease with heart failure and stage 1 through stage 4 chronic kidney disease, or unspecified chronic kidney disease: Secondary | ICD-10-CM | POA: Diagnosis not present

## 2020-07-30 DIAGNOSIS — G309 Alzheimer's disease, unspecified: Secondary | ICD-10-CM | POA: Diagnosis not present

## 2020-08-01 DIAGNOSIS — N183 Chronic kidney disease, stage 3 unspecified: Secondary | ICD-10-CM | POA: Diagnosis not present

## 2020-08-01 DIAGNOSIS — F0280 Dementia in other diseases classified elsewhere without behavioral disturbance: Secondary | ICD-10-CM | POA: Diagnosis not present

## 2020-08-01 DIAGNOSIS — G309 Alzheimer's disease, unspecified: Secondary | ICD-10-CM | POA: Diagnosis not present

## 2020-08-01 DIAGNOSIS — I672 Cerebral atherosclerosis: Secondary | ICD-10-CM | POA: Diagnosis not present

## 2020-08-01 DIAGNOSIS — I5032 Chronic diastolic (congestive) heart failure: Secondary | ICD-10-CM | POA: Diagnosis not present

## 2020-08-01 DIAGNOSIS — I130 Hypertensive heart and chronic kidney disease with heart failure and stage 1 through stage 4 chronic kidney disease, or unspecified chronic kidney disease: Secondary | ICD-10-CM | POA: Diagnosis not present

## 2020-08-03 DIAGNOSIS — G309 Alzheimer's disease, unspecified: Secondary | ICD-10-CM | POA: Diagnosis not present

## 2020-08-03 DIAGNOSIS — I672 Cerebral atherosclerosis: Secondary | ICD-10-CM | POA: Diagnosis not present

## 2020-08-03 DIAGNOSIS — I5032 Chronic diastolic (congestive) heart failure: Secondary | ICD-10-CM | POA: Diagnosis not present

## 2020-08-03 DIAGNOSIS — I130 Hypertensive heart and chronic kidney disease with heart failure and stage 1 through stage 4 chronic kidney disease, or unspecified chronic kidney disease: Secondary | ICD-10-CM | POA: Diagnosis not present

## 2020-08-03 DIAGNOSIS — N183 Chronic kidney disease, stage 3 unspecified: Secondary | ICD-10-CM | POA: Diagnosis not present

## 2020-08-03 DIAGNOSIS — F0280 Dementia in other diseases classified elsewhere without behavioral disturbance: Secondary | ICD-10-CM | POA: Diagnosis not present

## 2020-08-05 DIAGNOSIS — G309 Alzheimer's disease, unspecified: Secondary | ICD-10-CM | POA: Diagnosis not present

## 2020-08-05 DIAGNOSIS — I130 Hypertensive heart and chronic kidney disease with heart failure and stage 1 through stage 4 chronic kidney disease, or unspecified chronic kidney disease: Secondary | ICD-10-CM | POA: Diagnosis not present

## 2020-08-05 DIAGNOSIS — I5032 Chronic diastolic (congestive) heart failure: Secondary | ICD-10-CM | POA: Diagnosis not present

## 2020-08-05 DIAGNOSIS — I672 Cerebral atherosclerosis: Secondary | ICD-10-CM | POA: Diagnosis not present

## 2020-08-05 DIAGNOSIS — F0280 Dementia in other diseases classified elsewhere without behavioral disturbance: Secondary | ICD-10-CM | POA: Diagnosis not present

## 2020-08-05 DIAGNOSIS — N183 Chronic kidney disease, stage 3 unspecified: Secondary | ICD-10-CM | POA: Diagnosis not present

## 2020-08-06 DIAGNOSIS — I5032 Chronic diastolic (congestive) heart failure: Secondary | ICD-10-CM | POA: Diagnosis not present

## 2020-08-06 DIAGNOSIS — I773 Arterial fibromuscular dysplasia: Secondary | ICD-10-CM | POA: Diagnosis not present

## 2020-08-06 DIAGNOSIS — N183 Chronic kidney disease, stage 3 unspecified: Secondary | ICD-10-CM | POA: Diagnosis not present

## 2020-08-06 DIAGNOSIS — F0280 Dementia in other diseases classified elsewhere without behavioral disturbance: Secondary | ICD-10-CM | POA: Diagnosis not present

## 2020-08-06 DIAGNOSIS — Z20822 Contact with and (suspected) exposure to covid-19: Secondary | ICD-10-CM | POA: Diagnosis not present

## 2020-08-06 DIAGNOSIS — Z7901 Long term (current) use of anticoagulants: Secondary | ICD-10-CM | POA: Diagnosis not present

## 2020-08-06 DIAGNOSIS — N6019 Diffuse cystic mastopathy of unspecified breast: Secondary | ICD-10-CM | POA: Diagnosis not present

## 2020-08-06 DIAGNOSIS — I480 Paroxysmal atrial fibrillation: Secondary | ICD-10-CM | POA: Diagnosis not present

## 2020-08-06 DIAGNOSIS — G309 Alzheimer's disease, unspecified: Secondary | ICD-10-CM | POA: Diagnosis not present

## 2020-08-06 DIAGNOSIS — I672 Cerebral atherosclerosis: Secondary | ICD-10-CM | POA: Diagnosis not present

## 2020-08-06 DIAGNOSIS — I081 Rheumatic disorders of both mitral and tricuspid valves: Secondary | ICD-10-CM | POA: Diagnosis not present

## 2020-08-06 DIAGNOSIS — I701 Atherosclerosis of renal artery: Secondary | ICD-10-CM | POA: Diagnosis not present

## 2020-08-06 DIAGNOSIS — I130 Hypertensive heart and chronic kidney disease with heart failure and stage 1 through stage 4 chronic kidney disease, or unspecified chronic kidney disease: Secondary | ICD-10-CM | POA: Diagnosis not present

## 2020-08-06 DIAGNOSIS — R32 Unspecified urinary incontinence: Secondary | ICD-10-CM | POA: Diagnosis not present

## 2020-08-06 DIAGNOSIS — Z8673 Personal history of transient ischemic attack (TIA), and cerebral infarction without residual deficits: Secondary | ICD-10-CM | POA: Diagnosis not present

## 2020-08-07 DIAGNOSIS — N183 Chronic kidney disease, stage 3 unspecified: Secondary | ICD-10-CM | POA: Diagnosis not present

## 2020-08-07 DIAGNOSIS — I5032 Chronic diastolic (congestive) heart failure: Secondary | ICD-10-CM | POA: Diagnosis not present

## 2020-08-07 DIAGNOSIS — I672 Cerebral atherosclerosis: Secondary | ICD-10-CM | POA: Diagnosis not present

## 2020-08-07 DIAGNOSIS — F0280 Dementia in other diseases classified elsewhere without behavioral disturbance: Secondary | ICD-10-CM | POA: Diagnosis not present

## 2020-08-07 DIAGNOSIS — G309 Alzheimer's disease, unspecified: Secondary | ICD-10-CM | POA: Diagnosis not present

## 2020-08-07 DIAGNOSIS — I130 Hypertensive heart and chronic kidney disease with heart failure and stage 1 through stage 4 chronic kidney disease, or unspecified chronic kidney disease: Secondary | ICD-10-CM | POA: Diagnosis not present

## 2020-08-08 DIAGNOSIS — I130 Hypertensive heart and chronic kidney disease with heart failure and stage 1 through stage 4 chronic kidney disease, or unspecified chronic kidney disease: Secondary | ICD-10-CM | POA: Diagnosis not present

## 2020-08-08 DIAGNOSIS — N183 Chronic kidney disease, stage 3 unspecified: Secondary | ICD-10-CM | POA: Diagnosis not present

## 2020-08-08 DIAGNOSIS — F0280 Dementia in other diseases classified elsewhere without behavioral disturbance: Secondary | ICD-10-CM | POA: Diagnosis not present

## 2020-08-08 DIAGNOSIS — I5032 Chronic diastolic (congestive) heart failure: Secondary | ICD-10-CM | POA: Diagnosis not present

## 2020-08-08 DIAGNOSIS — G309 Alzheimer's disease, unspecified: Secondary | ICD-10-CM | POA: Diagnosis not present

## 2020-08-08 DIAGNOSIS — I672 Cerebral atherosclerosis: Secondary | ICD-10-CM | POA: Diagnosis not present

## 2020-08-10 DIAGNOSIS — F0280 Dementia in other diseases classified elsewhere without behavioral disturbance: Secondary | ICD-10-CM | POA: Diagnosis not present

## 2020-08-10 DIAGNOSIS — G309 Alzheimer's disease, unspecified: Secondary | ICD-10-CM | POA: Diagnosis not present

## 2020-08-10 DIAGNOSIS — I5032 Chronic diastolic (congestive) heart failure: Secondary | ICD-10-CM | POA: Diagnosis not present

## 2020-08-10 DIAGNOSIS — I672 Cerebral atherosclerosis: Secondary | ICD-10-CM | POA: Diagnosis not present

## 2020-08-10 DIAGNOSIS — N183 Chronic kidney disease, stage 3 unspecified: Secondary | ICD-10-CM | POA: Diagnosis not present

## 2020-08-10 DIAGNOSIS — I130 Hypertensive heart and chronic kidney disease with heart failure and stage 1 through stage 4 chronic kidney disease, or unspecified chronic kidney disease: Secondary | ICD-10-CM | POA: Diagnosis not present

## 2020-08-13 DIAGNOSIS — F0280 Dementia in other diseases classified elsewhere without behavioral disturbance: Secondary | ICD-10-CM | POA: Diagnosis not present

## 2020-08-13 DIAGNOSIS — G309 Alzheimer's disease, unspecified: Secondary | ICD-10-CM | POA: Diagnosis not present

## 2020-08-13 DIAGNOSIS — I130 Hypertensive heart and chronic kidney disease with heart failure and stage 1 through stage 4 chronic kidney disease, or unspecified chronic kidney disease: Secondary | ICD-10-CM | POA: Diagnosis not present

## 2020-08-13 DIAGNOSIS — I672 Cerebral atherosclerosis: Secondary | ICD-10-CM | POA: Diagnosis not present

## 2020-08-13 DIAGNOSIS — I5032 Chronic diastolic (congestive) heart failure: Secondary | ICD-10-CM | POA: Diagnosis not present

## 2020-08-13 DIAGNOSIS — N183 Chronic kidney disease, stage 3 unspecified: Secondary | ICD-10-CM | POA: Diagnosis not present

## 2020-08-15 DIAGNOSIS — F0280 Dementia in other diseases classified elsewhere without behavioral disturbance: Secondary | ICD-10-CM | POA: Diagnosis not present

## 2020-08-15 DIAGNOSIS — I130 Hypertensive heart and chronic kidney disease with heart failure and stage 1 through stage 4 chronic kidney disease, or unspecified chronic kidney disease: Secondary | ICD-10-CM | POA: Diagnosis not present

## 2020-08-15 DIAGNOSIS — G309 Alzheimer's disease, unspecified: Secondary | ICD-10-CM | POA: Diagnosis not present

## 2020-08-15 DIAGNOSIS — I5032 Chronic diastolic (congestive) heart failure: Secondary | ICD-10-CM | POA: Diagnosis not present

## 2020-08-15 DIAGNOSIS — I672 Cerebral atherosclerosis: Secondary | ICD-10-CM | POA: Diagnosis not present

## 2020-08-15 DIAGNOSIS — N183 Chronic kidney disease, stage 3 unspecified: Secondary | ICD-10-CM | POA: Diagnosis not present

## 2020-08-16 DIAGNOSIS — N183 Chronic kidney disease, stage 3 unspecified: Secondary | ICD-10-CM | POA: Diagnosis not present

## 2020-08-16 DIAGNOSIS — I5032 Chronic diastolic (congestive) heart failure: Secondary | ICD-10-CM | POA: Diagnosis not present

## 2020-08-16 DIAGNOSIS — I672 Cerebral atherosclerosis: Secondary | ICD-10-CM | POA: Diagnosis not present

## 2020-08-16 DIAGNOSIS — I130 Hypertensive heart and chronic kidney disease with heart failure and stage 1 through stage 4 chronic kidney disease, or unspecified chronic kidney disease: Secondary | ICD-10-CM | POA: Diagnosis not present

## 2020-08-16 DIAGNOSIS — G309 Alzheimer's disease, unspecified: Secondary | ICD-10-CM | POA: Diagnosis not present

## 2020-08-16 DIAGNOSIS — F0280 Dementia in other diseases classified elsewhere without behavioral disturbance: Secondary | ICD-10-CM | POA: Diagnosis not present

## 2020-08-20 DIAGNOSIS — G309 Alzheimer's disease, unspecified: Secondary | ICD-10-CM | POA: Diagnosis not present

## 2020-08-20 DIAGNOSIS — I5032 Chronic diastolic (congestive) heart failure: Secondary | ICD-10-CM | POA: Diagnosis not present

## 2020-08-20 DIAGNOSIS — I130 Hypertensive heart and chronic kidney disease with heart failure and stage 1 through stage 4 chronic kidney disease, or unspecified chronic kidney disease: Secondary | ICD-10-CM | POA: Diagnosis not present

## 2020-08-20 DIAGNOSIS — N183 Chronic kidney disease, stage 3 unspecified: Secondary | ICD-10-CM | POA: Diagnosis not present

## 2020-08-20 DIAGNOSIS — I672 Cerebral atherosclerosis: Secondary | ICD-10-CM | POA: Diagnosis not present

## 2020-08-20 DIAGNOSIS — F0280 Dementia in other diseases classified elsewhere without behavioral disturbance: Secondary | ICD-10-CM | POA: Diagnosis not present

## 2020-08-22 DIAGNOSIS — N183 Chronic kidney disease, stage 3 unspecified: Secondary | ICD-10-CM | POA: Diagnosis not present

## 2020-08-22 DIAGNOSIS — G309 Alzheimer's disease, unspecified: Secondary | ICD-10-CM | POA: Diagnosis not present

## 2020-08-22 DIAGNOSIS — I672 Cerebral atherosclerosis: Secondary | ICD-10-CM | POA: Diagnosis not present

## 2020-08-22 DIAGNOSIS — I5032 Chronic diastolic (congestive) heart failure: Secondary | ICD-10-CM | POA: Diagnosis not present

## 2020-08-22 DIAGNOSIS — F0280 Dementia in other diseases classified elsewhere without behavioral disturbance: Secondary | ICD-10-CM | POA: Diagnosis not present

## 2020-08-22 DIAGNOSIS — I130 Hypertensive heart and chronic kidney disease with heart failure and stage 1 through stage 4 chronic kidney disease, or unspecified chronic kidney disease: Secondary | ICD-10-CM | POA: Diagnosis not present

## 2020-08-24 DIAGNOSIS — F0280 Dementia in other diseases classified elsewhere without behavioral disturbance: Secondary | ICD-10-CM | POA: Diagnosis not present

## 2020-08-24 DIAGNOSIS — G309 Alzheimer's disease, unspecified: Secondary | ICD-10-CM | POA: Diagnosis not present

## 2020-08-24 DIAGNOSIS — I5032 Chronic diastolic (congestive) heart failure: Secondary | ICD-10-CM | POA: Diagnosis not present

## 2020-08-24 DIAGNOSIS — N183 Chronic kidney disease, stage 3 unspecified: Secondary | ICD-10-CM | POA: Diagnosis not present

## 2020-08-24 DIAGNOSIS — I130 Hypertensive heart and chronic kidney disease with heart failure and stage 1 through stage 4 chronic kidney disease, or unspecified chronic kidney disease: Secondary | ICD-10-CM | POA: Diagnosis not present

## 2020-08-24 DIAGNOSIS — I672 Cerebral atherosclerosis: Secondary | ICD-10-CM | POA: Diagnosis not present

## 2020-08-27 DIAGNOSIS — N183 Chronic kidney disease, stage 3 unspecified: Secondary | ICD-10-CM | POA: Diagnosis not present

## 2020-08-27 DIAGNOSIS — I672 Cerebral atherosclerosis: Secondary | ICD-10-CM | POA: Diagnosis not present

## 2020-08-27 DIAGNOSIS — G309 Alzheimer's disease, unspecified: Secondary | ICD-10-CM | POA: Diagnosis not present

## 2020-08-27 DIAGNOSIS — I5032 Chronic diastolic (congestive) heart failure: Secondary | ICD-10-CM | POA: Diagnosis not present

## 2020-08-27 DIAGNOSIS — F0280 Dementia in other diseases classified elsewhere without behavioral disturbance: Secondary | ICD-10-CM | POA: Diagnosis not present

## 2020-08-27 DIAGNOSIS — I130 Hypertensive heart and chronic kidney disease with heart failure and stage 1 through stage 4 chronic kidney disease, or unspecified chronic kidney disease: Secondary | ICD-10-CM | POA: Diagnosis not present

## 2020-08-28 DIAGNOSIS — N183 Chronic kidney disease, stage 3 unspecified: Secondary | ICD-10-CM | POA: Diagnosis not present

## 2020-08-28 DIAGNOSIS — F0280 Dementia in other diseases classified elsewhere without behavioral disturbance: Secondary | ICD-10-CM | POA: Diagnosis not present

## 2020-08-28 DIAGNOSIS — I130 Hypertensive heart and chronic kidney disease with heart failure and stage 1 through stage 4 chronic kidney disease, or unspecified chronic kidney disease: Secondary | ICD-10-CM | POA: Diagnosis not present

## 2020-08-28 DIAGNOSIS — G309 Alzheimer's disease, unspecified: Secondary | ICD-10-CM | POA: Diagnosis not present

## 2020-08-28 DIAGNOSIS — I5032 Chronic diastolic (congestive) heart failure: Secondary | ICD-10-CM | POA: Diagnosis not present

## 2020-08-28 DIAGNOSIS — I672 Cerebral atherosclerosis: Secondary | ICD-10-CM | POA: Diagnosis not present

## 2020-08-29 DIAGNOSIS — G309 Alzheimer's disease, unspecified: Secondary | ICD-10-CM | POA: Diagnosis not present

## 2020-08-29 DIAGNOSIS — I672 Cerebral atherosclerosis: Secondary | ICD-10-CM | POA: Diagnosis not present

## 2020-08-29 DIAGNOSIS — F0280 Dementia in other diseases classified elsewhere without behavioral disturbance: Secondary | ICD-10-CM | POA: Diagnosis not present

## 2020-08-29 DIAGNOSIS — N183 Chronic kidney disease, stage 3 unspecified: Secondary | ICD-10-CM | POA: Diagnosis not present

## 2020-08-29 DIAGNOSIS — I5032 Chronic diastolic (congestive) heart failure: Secondary | ICD-10-CM | POA: Diagnosis not present

## 2020-08-29 DIAGNOSIS — I130 Hypertensive heart and chronic kidney disease with heart failure and stage 1 through stage 4 chronic kidney disease, or unspecified chronic kidney disease: Secondary | ICD-10-CM | POA: Diagnosis not present

## 2020-08-31 DIAGNOSIS — I130 Hypertensive heart and chronic kidney disease with heart failure and stage 1 through stage 4 chronic kidney disease, or unspecified chronic kidney disease: Secondary | ICD-10-CM | POA: Diagnosis not present

## 2020-08-31 DIAGNOSIS — I5032 Chronic diastolic (congestive) heart failure: Secondary | ICD-10-CM | POA: Diagnosis not present

## 2020-08-31 DIAGNOSIS — N183 Chronic kidney disease, stage 3 unspecified: Secondary | ICD-10-CM | POA: Diagnosis not present

## 2020-08-31 DIAGNOSIS — F0280 Dementia in other diseases classified elsewhere without behavioral disturbance: Secondary | ICD-10-CM | POA: Diagnosis not present

## 2020-08-31 DIAGNOSIS — G309 Alzheimer's disease, unspecified: Secondary | ICD-10-CM | POA: Diagnosis not present

## 2020-08-31 DIAGNOSIS — I672 Cerebral atherosclerosis: Secondary | ICD-10-CM | POA: Diagnosis not present

## 2020-09-03 DIAGNOSIS — I5032 Chronic diastolic (congestive) heart failure: Secondary | ICD-10-CM | POA: Diagnosis not present

## 2020-09-03 DIAGNOSIS — G309 Alzheimer's disease, unspecified: Secondary | ICD-10-CM | POA: Diagnosis not present

## 2020-09-03 DIAGNOSIS — I130 Hypertensive heart and chronic kidney disease with heart failure and stage 1 through stage 4 chronic kidney disease, or unspecified chronic kidney disease: Secondary | ICD-10-CM | POA: Diagnosis not present

## 2020-09-03 DIAGNOSIS — I672 Cerebral atherosclerosis: Secondary | ICD-10-CM | POA: Diagnosis not present

## 2020-09-03 DIAGNOSIS — F0280 Dementia in other diseases classified elsewhere without behavioral disturbance: Secondary | ICD-10-CM | POA: Diagnosis not present

## 2020-09-03 DIAGNOSIS — N183 Chronic kidney disease, stage 3 unspecified: Secondary | ICD-10-CM | POA: Diagnosis not present

## 2020-09-04 DIAGNOSIS — N183 Chronic kidney disease, stage 3 unspecified: Secondary | ICD-10-CM | POA: Diagnosis not present

## 2020-09-04 DIAGNOSIS — I130 Hypertensive heart and chronic kidney disease with heart failure and stage 1 through stage 4 chronic kidney disease, or unspecified chronic kidney disease: Secondary | ICD-10-CM | POA: Diagnosis not present

## 2020-09-04 DIAGNOSIS — I5032 Chronic diastolic (congestive) heart failure: Secondary | ICD-10-CM | POA: Diagnosis not present

## 2020-09-04 DIAGNOSIS — I672 Cerebral atherosclerosis: Secondary | ICD-10-CM | POA: Diagnosis not present

## 2020-09-04 DIAGNOSIS — F0280 Dementia in other diseases classified elsewhere without behavioral disturbance: Secondary | ICD-10-CM | POA: Diagnosis not present

## 2020-09-04 DIAGNOSIS — G309 Alzheimer's disease, unspecified: Secondary | ICD-10-CM | POA: Diagnosis not present

## 2020-09-05 DIAGNOSIS — N183 Chronic kidney disease, stage 3 unspecified: Secondary | ICD-10-CM | POA: Diagnosis not present

## 2020-09-05 DIAGNOSIS — G309 Alzheimer's disease, unspecified: Secondary | ICD-10-CM | POA: Diagnosis not present

## 2020-09-05 DIAGNOSIS — F0280 Dementia in other diseases classified elsewhere without behavioral disturbance: Secondary | ICD-10-CM | POA: Diagnosis not present

## 2020-09-05 DIAGNOSIS — I130 Hypertensive heart and chronic kidney disease with heart failure and stage 1 through stage 4 chronic kidney disease, or unspecified chronic kidney disease: Secondary | ICD-10-CM | POA: Diagnosis not present

## 2020-09-05 DIAGNOSIS — I5032 Chronic diastolic (congestive) heart failure: Secondary | ICD-10-CM | POA: Diagnosis not present

## 2020-09-05 DIAGNOSIS — I672 Cerebral atherosclerosis: Secondary | ICD-10-CM | POA: Diagnosis not present

## 2020-09-06 DIAGNOSIS — G309 Alzheimer's disease, unspecified: Secondary | ICD-10-CM | POA: Diagnosis not present

## 2020-09-06 DIAGNOSIS — N183 Chronic kidney disease, stage 3 unspecified: Secondary | ICD-10-CM | POA: Diagnosis not present

## 2020-09-06 DIAGNOSIS — Z8673 Personal history of transient ischemic attack (TIA), and cerebral infarction without residual deficits: Secondary | ICD-10-CM | POA: Diagnosis not present

## 2020-09-06 DIAGNOSIS — I672 Cerebral atherosclerosis: Secondary | ICD-10-CM | POA: Diagnosis not present

## 2020-09-06 DIAGNOSIS — I701 Atherosclerosis of renal artery: Secondary | ICD-10-CM | POA: Diagnosis not present

## 2020-09-06 DIAGNOSIS — I5032 Chronic diastolic (congestive) heart failure: Secondary | ICD-10-CM | POA: Diagnosis not present

## 2020-09-06 DIAGNOSIS — R32 Unspecified urinary incontinence: Secondary | ICD-10-CM | POA: Diagnosis not present

## 2020-09-06 DIAGNOSIS — I130 Hypertensive heart and chronic kidney disease with heart failure and stage 1 through stage 4 chronic kidney disease, or unspecified chronic kidney disease: Secondary | ICD-10-CM | POA: Diagnosis not present

## 2020-09-06 DIAGNOSIS — N6019 Diffuse cystic mastopathy of unspecified breast: Secondary | ICD-10-CM | POA: Diagnosis not present

## 2020-09-06 DIAGNOSIS — F0280 Dementia in other diseases classified elsewhere without behavioral disturbance: Secondary | ICD-10-CM | POA: Diagnosis not present

## 2020-09-06 DIAGNOSIS — Z7901 Long term (current) use of anticoagulants: Secondary | ICD-10-CM | POA: Diagnosis not present

## 2020-09-06 DIAGNOSIS — I480 Paroxysmal atrial fibrillation: Secondary | ICD-10-CM | POA: Diagnosis not present

## 2020-09-06 DIAGNOSIS — I773 Arterial fibromuscular dysplasia: Secondary | ICD-10-CM | POA: Diagnosis not present

## 2020-09-06 DIAGNOSIS — I081 Rheumatic disorders of both mitral and tricuspid valves: Secondary | ICD-10-CM | POA: Diagnosis not present

## 2020-09-07 DIAGNOSIS — Z4889 Encounter for other specified surgical aftercare: Secondary | ICD-10-CM | POA: Diagnosis not present

## 2020-09-07 DIAGNOSIS — I672 Cerebral atherosclerosis: Secondary | ICD-10-CM | POA: Diagnosis not present

## 2020-09-07 DIAGNOSIS — Z7984 Long term (current) use of oral hypoglycemic drugs: Secondary | ICD-10-CM | POA: Diagnosis not present

## 2020-09-07 DIAGNOSIS — F0280 Dementia in other diseases classified elsewhere without behavioral disturbance: Secondary | ICD-10-CM | POA: Diagnosis not present

## 2020-09-07 DIAGNOSIS — G309 Alzheimer's disease, unspecified: Secondary | ICD-10-CM | POA: Diagnosis not present

## 2020-09-07 DIAGNOSIS — N183 Chronic kidney disease, stage 3 unspecified: Secondary | ICD-10-CM | POA: Diagnosis not present

## 2020-09-07 DIAGNOSIS — I130 Hypertensive heart and chronic kidney disease with heart failure and stage 1 through stage 4 chronic kidney disease, or unspecified chronic kidney disease: Secondary | ICD-10-CM | POA: Diagnosis not present

## 2020-09-07 DIAGNOSIS — E119 Type 2 diabetes mellitus without complications: Secondary | ICD-10-CM | POA: Diagnosis not present

## 2020-09-07 DIAGNOSIS — I5032 Chronic diastolic (congestive) heart failure: Secondary | ICD-10-CM | POA: Diagnosis not present

## 2020-09-07 DIAGNOSIS — F0390 Unspecified dementia without behavioral disturbance: Secondary | ICD-10-CM | POA: Diagnosis not present

## 2020-09-07 DIAGNOSIS — E782 Mixed hyperlipidemia: Secondary | ICD-10-CM | POA: Diagnosis not present

## 2020-09-12 DIAGNOSIS — G309 Alzheimer's disease, unspecified: Secondary | ICD-10-CM | POA: Diagnosis not present

## 2020-09-12 DIAGNOSIS — I672 Cerebral atherosclerosis: Secondary | ICD-10-CM | POA: Diagnosis not present

## 2020-09-12 DIAGNOSIS — F0280 Dementia in other diseases classified elsewhere without behavioral disturbance: Secondary | ICD-10-CM | POA: Diagnosis not present

## 2020-09-12 DIAGNOSIS — N183 Chronic kidney disease, stage 3 unspecified: Secondary | ICD-10-CM | POA: Diagnosis not present

## 2020-09-12 DIAGNOSIS — I130 Hypertensive heart and chronic kidney disease with heart failure and stage 1 through stage 4 chronic kidney disease, or unspecified chronic kidney disease: Secondary | ICD-10-CM | POA: Diagnosis not present

## 2020-09-12 DIAGNOSIS — I5032 Chronic diastolic (congestive) heart failure: Secondary | ICD-10-CM | POA: Diagnosis not present

## 2020-09-14 DIAGNOSIS — I130 Hypertensive heart and chronic kidney disease with heart failure and stage 1 through stage 4 chronic kidney disease, or unspecified chronic kidney disease: Secondary | ICD-10-CM | POA: Diagnosis not present

## 2020-09-14 DIAGNOSIS — N183 Chronic kidney disease, stage 3 unspecified: Secondary | ICD-10-CM | POA: Diagnosis not present

## 2020-09-14 DIAGNOSIS — I5032 Chronic diastolic (congestive) heart failure: Secondary | ICD-10-CM | POA: Diagnosis not present

## 2020-09-14 DIAGNOSIS — F0280 Dementia in other diseases classified elsewhere without behavioral disturbance: Secondary | ICD-10-CM | POA: Diagnosis not present

## 2020-09-14 DIAGNOSIS — G309 Alzheimer's disease, unspecified: Secondary | ICD-10-CM | POA: Diagnosis not present

## 2020-09-14 DIAGNOSIS — I672 Cerebral atherosclerosis: Secondary | ICD-10-CM | POA: Diagnosis not present

## 2020-09-17 DIAGNOSIS — I5032 Chronic diastolic (congestive) heart failure: Secondary | ICD-10-CM | POA: Diagnosis not present

## 2020-09-17 DIAGNOSIS — G309 Alzheimer's disease, unspecified: Secondary | ICD-10-CM | POA: Diagnosis not present

## 2020-09-17 DIAGNOSIS — I130 Hypertensive heart and chronic kidney disease with heart failure and stage 1 through stage 4 chronic kidney disease, or unspecified chronic kidney disease: Secondary | ICD-10-CM | POA: Diagnosis not present

## 2020-09-17 DIAGNOSIS — N183 Chronic kidney disease, stage 3 unspecified: Secondary | ICD-10-CM | POA: Diagnosis not present

## 2020-09-17 DIAGNOSIS — I672 Cerebral atherosclerosis: Secondary | ICD-10-CM | POA: Diagnosis not present

## 2020-09-17 DIAGNOSIS — F0280 Dementia in other diseases classified elsewhere without behavioral disturbance: Secondary | ICD-10-CM | POA: Diagnosis not present

## 2020-09-18 DIAGNOSIS — I5032 Chronic diastolic (congestive) heart failure: Secondary | ICD-10-CM | POA: Diagnosis not present

## 2020-09-18 DIAGNOSIS — N183 Chronic kidney disease, stage 3 unspecified: Secondary | ICD-10-CM | POA: Diagnosis not present

## 2020-09-18 DIAGNOSIS — F0280 Dementia in other diseases classified elsewhere without behavioral disturbance: Secondary | ICD-10-CM | POA: Diagnosis not present

## 2020-09-18 DIAGNOSIS — G309 Alzheimer's disease, unspecified: Secondary | ICD-10-CM | POA: Diagnosis not present

## 2020-09-18 DIAGNOSIS — I672 Cerebral atherosclerosis: Secondary | ICD-10-CM | POA: Diagnosis not present

## 2020-09-18 DIAGNOSIS — I130 Hypertensive heart and chronic kidney disease with heart failure and stage 1 through stage 4 chronic kidney disease, or unspecified chronic kidney disease: Secondary | ICD-10-CM | POA: Diagnosis not present

## 2020-09-19 DIAGNOSIS — F0280 Dementia in other diseases classified elsewhere without behavioral disturbance: Secondary | ICD-10-CM | POA: Diagnosis not present

## 2020-09-19 DIAGNOSIS — I5032 Chronic diastolic (congestive) heart failure: Secondary | ICD-10-CM | POA: Diagnosis not present

## 2020-09-19 DIAGNOSIS — N183 Chronic kidney disease, stage 3 unspecified: Secondary | ICD-10-CM | POA: Diagnosis not present

## 2020-09-19 DIAGNOSIS — I672 Cerebral atherosclerosis: Secondary | ICD-10-CM | POA: Diagnosis not present

## 2020-09-19 DIAGNOSIS — G309 Alzheimer's disease, unspecified: Secondary | ICD-10-CM | POA: Diagnosis not present

## 2020-09-19 DIAGNOSIS — I130 Hypertensive heart and chronic kidney disease with heart failure and stage 1 through stage 4 chronic kidney disease, or unspecified chronic kidney disease: Secondary | ICD-10-CM | POA: Diagnosis not present

## 2020-09-21 DIAGNOSIS — N183 Chronic kidney disease, stage 3 unspecified: Secondary | ICD-10-CM | POA: Diagnosis not present

## 2020-09-21 DIAGNOSIS — I5032 Chronic diastolic (congestive) heart failure: Secondary | ICD-10-CM | POA: Diagnosis not present

## 2020-09-21 DIAGNOSIS — I672 Cerebral atherosclerosis: Secondary | ICD-10-CM | POA: Diagnosis not present

## 2020-09-21 DIAGNOSIS — F0280 Dementia in other diseases classified elsewhere without behavioral disturbance: Secondary | ICD-10-CM | POA: Diagnosis not present

## 2020-09-21 DIAGNOSIS — G309 Alzheimer's disease, unspecified: Secondary | ICD-10-CM | POA: Diagnosis not present

## 2020-09-21 DIAGNOSIS — I130 Hypertensive heart and chronic kidney disease with heart failure and stage 1 through stage 4 chronic kidney disease, or unspecified chronic kidney disease: Secondary | ICD-10-CM | POA: Diagnosis not present

## 2020-09-24 DIAGNOSIS — N183 Chronic kidney disease, stage 3 unspecified: Secondary | ICD-10-CM | POA: Diagnosis not present

## 2020-09-24 DIAGNOSIS — I672 Cerebral atherosclerosis: Secondary | ICD-10-CM | POA: Diagnosis not present

## 2020-09-24 DIAGNOSIS — F0280 Dementia in other diseases classified elsewhere without behavioral disturbance: Secondary | ICD-10-CM | POA: Diagnosis not present

## 2020-09-24 DIAGNOSIS — G309 Alzheimer's disease, unspecified: Secondary | ICD-10-CM | POA: Diagnosis not present

## 2020-09-24 DIAGNOSIS — I5032 Chronic diastolic (congestive) heart failure: Secondary | ICD-10-CM | POA: Diagnosis not present

## 2020-09-24 DIAGNOSIS — I130 Hypertensive heart and chronic kidney disease with heart failure and stage 1 through stage 4 chronic kidney disease, or unspecified chronic kidney disease: Secondary | ICD-10-CM | POA: Diagnosis not present

## 2020-09-25 DIAGNOSIS — I5032 Chronic diastolic (congestive) heart failure: Secondary | ICD-10-CM | POA: Diagnosis not present

## 2020-09-25 DIAGNOSIS — I672 Cerebral atherosclerosis: Secondary | ICD-10-CM | POA: Diagnosis not present

## 2020-09-25 DIAGNOSIS — I130 Hypertensive heart and chronic kidney disease with heart failure and stage 1 through stage 4 chronic kidney disease, or unspecified chronic kidney disease: Secondary | ICD-10-CM | POA: Diagnosis not present

## 2020-09-25 DIAGNOSIS — G309 Alzheimer's disease, unspecified: Secondary | ICD-10-CM | POA: Diagnosis not present

## 2020-09-25 DIAGNOSIS — Z20822 Contact with and (suspected) exposure to covid-19: Secondary | ICD-10-CM | POA: Diagnosis not present

## 2020-09-25 DIAGNOSIS — N183 Chronic kidney disease, stage 3 unspecified: Secondary | ICD-10-CM | POA: Diagnosis not present

## 2020-09-25 DIAGNOSIS — F0280 Dementia in other diseases classified elsewhere without behavioral disturbance: Secondary | ICD-10-CM | POA: Diagnosis not present

## 2020-09-26 DIAGNOSIS — I5032 Chronic diastolic (congestive) heart failure: Secondary | ICD-10-CM | POA: Diagnosis not present

## 2020-09-26 DIAGNOSIS — N183 Chronic kidney disease, stage 3 unspecified: Secondary | ICD-10-CM | POA: Diagnosis not present

## 2020-09-26 DIAGNOSIS — G309 Alzheimer's disease, unspecified: Secondary | ICD-10-CM | POA: Diagnosis not present

## 2020-09-26 DIAGNOSIS — F0280 Dementia in other diseases classified elsewhere without behavioral disturbance: Secondary | ICD-10-CM | POA: Diagnosis not present

## 2020-09-26 DIAGNOSIS — I130 Hypertensive heart and chronic kidney disease with heart failure and stage 1 through stage 4 chronic kidney disease, or unspecified chronic kidney disease: Secondary | ICD-10-CM | POA: Diagnosis not present

## 2020-09-26 DIAGNOSIS — I672 Cerebral atherosclerosis: Secondary | ICD-10-CM | POA: Diagnosis not present

## 2020-09-27 DIAGNOSIS — I672 Cerebral atherosclerosis: Secondary | ICD-10-CM | POA: Diagnosis not present

## 2020-09-27 DIAGNOSIS — F0280 Dementia in other diseases classified elsewhere without behavioral disturbance: Secondary | ICD-10-CM | POA: Diagnosis not present

## 2020-09-27 DIAGNOSIS — G309 Alzheimer's disease, unspecified: Secondary | ICD-10-CM | POA: Diagnosis not present

## 2020-09-27 DIAGNOSIS — I5032 Chronic diastolic (congestive) heart failure: Secondary | ICD-10-CM | POA: Diagnosis not present

## 2020-09-27 DIAGNOSIS — I130 Hypertensive heart and chronic kidney disease with heart failure and stage 1 through stage 4 chronic kidney disease, or unspecified chronic kidney disease: Secondary | ICD-10-CM | POA: Diagnosis not present

## 2020-09-27 DIAGNOSIS — N183 Chronic kidney disease, stage 3 unspecified: Secondary | ICD-10-CM | POA: Diagnosis not present

## 2020-09-28 DIAGNOSIS — F0280 Dementia in other diseases classified elsewhere without behavioral disturbance: Secondary | ICD-10-CM | POA: Diagnosis not present

## 2020-09-28 DIAGNOSIS — G309 Alzheimer's disease, unspecified: Secondary | ICD-10-CM | POA: Diagnosis not present

## 2020-09-28 DIAGNOSIS — I130 Hypertensive heart and chronic kidney disease with heart failure and stage 1 through stage 4 chronic kidney disease, or unspecified chronic kidney disease: Secondary | ICD-10-CM | POA: Diagnosis not present

## 2020-09-28 DIAGNOSIS — I5032 Chronic diastolic (congestive) heart failure: Secondary | ICD-10-CM | POA: Diagnosis not present

## 2020-09-28 DIAGNOSIS — N183 Chronic kidney disease, stage 3 unspecified: Secondary | ICD-10-CM | POA: Diagnosis not present

## 2020-09-28 DIAGNOSIS — I672 Cerebral atherosclerosis: Secondary | ICD-10-CM | POA: Diagnosis not present

## 2020-10-02 DIAGNOSIS — I672 Cerebral atherosclerosis: Secondary | ICD-10-CM | POA: Diagnosis not present

## 2020-10-02 DIAGNOSIS — I130 Hypertensive heart and chronic kidney disease with heart failure and stage 1 through stage 4 chronic kidney disease, or unspecified chronic kidney disease: Secondary | ICD-10-CM | POA: Diagnosis not present

## 2020-10-02 DIAGNOSIS — G309 Alzheimer's disease, unspecified: Secondary | ICD-10-CM | POA: Diagnosis not present

## 2020-10-02 DIAGNOSIS — N183 Chronic kidney disease, stage 3 unspecified: Secondary | ICD-10-CM | POA: Diagnosis not present

## 2020-10-02 DIAGNOSIS — F0280 Dementia in other diseases classified elsewhere without behavioral disturbance: Secondary | ICD-10-CM | POA: Diagnosis not present

## 2020-10-02 DIAGNOSIS — I5032 Chronic diastolic (congestive) heart failure: Secondary | ICD-10-CM | POA: Diagnosis not present

## 2020-10-04 DIAGNOSIS — I130 Hypertensive heart and chronic kidney disease with heart failure and stage 1 through stage 4 chronic kidney disease, or unspecified chronic kidney disease: Secondary | ICD-10-CM | POA: Diagnosis not present

## 2020-10-04 DIAGNOSIS — G309 Alzheimer's disease, unspecified: Secondary | ICD-10-CM | POA: Diagnosis not present

## 2020-10-04 DIAGNOSIS — I672 Cerebral atherosclerosis: Secondary | ICD-10-CM | POA: Diagnosis not present

## 2020-10-04 DIAGNOSIS — I5032 Chronic diastolic (congestive) heart failure: Secondary | ICD-10-CM | POA: Diagnosis not present

## 2020-10-04 DIAGNOSIS — N183 Chronic kidney disease, stage 3 unspecified: Secondary | ICD-10-CM | POA: Diagnosis not present

## 2020-10-04 DIAGNOSIS — F0280 Dementia in other diseases classified elsewhere without behavioral disturbance: Secondary | ICD-10-CM | POA: Diagnosis not present

## 2020-10-05 DIAGNOSIS — I5032 Chronic diastolic (congestive) heart failure: Secondary | ICD-10-CM | POA: Diagnosis not present

## 2020-10-05 DIAGNOSIS — G309 Alzheimer's disease, unspecified: Secondary | ICD-10-CM | POA: Diagnosis not present

## 2020-10-05 DIAGNOSIS — F0280 Dementia in other diseases classified elsewhere without behavioral disturbance: Secondary | ICD-10-CM | POA: Diagnosis not present

## 2020-10-05 DIAGNOSIS — I672 Cerebral atherosclerosis: Secondary | ICD-10-CM | POA: Diagnosis not present

## 2020-10-05 DIAGNOSIS — I130 Hypertensive heart and chronic kidney disease with heart failure and stage 1 through stage 4 chronic kidney disease, or unspecified chronic kidney disease: Secondary | ICD-10-CM | POA: Diagnosis not present

## 2020-10-05 DIAGNOSIS — N183 Chronic kidney disease, stage 3 unspecified: Secondary | ICD-10-CM | POA: Diagnosis not present

## 2020-10-06 DIAGNOSIS — I5032 Chronic diastolic (congestive) heart failure: Secondary | ICD-10-CM | POA: Diagnosis not present

## 2020-10-06 DIAGNOSIS — Z7901 Long term (current) use of anticoagulants: Secondary | ICD-10-CM | POA: Diagnosis not present

## 2020-10-06 DIAGNOSIS — I672 Cerebral atherosclerosis: Secondary | ICD-10-CM | POA: Diagnosis not present

## 2020-10-06 DIAGNOSIS — N6019 Diffuse cystic mastopathy of unspecified breast: Secondary | ICD-10-CM | POA: Diagnosis not present

## 2020-10-06 DIAGNOSIS — G309 Alzheimer's disease, unspecified: Secondary | ICD-10-CM | POA: Diagnosis not present

## 2020-10-06 DIAGNOSIS — R32 Unspecified urinary incontinence: Secondary | ICD-10-CM | POA: Diagnosis not present

## 2020-10-06 DIAGNOSIS — I480 Paroxysmal atrial fibrillation: Secondary | ICD-10-CM | POA: Diagnosis not present

## 2020-10-06 DIAGNOSIS — F0280 Dementia in other diseases classified elsewhere without behavioral disturbance: Secondary | ICD-10-CM | POA: Diagnosis not present

## 2020-10-06 DIAGNOSIS — I773 Arterial fibromuscular dysplasia: Secondary | ICD-10-CM | POA: Diagnosis not present

## 2020-10-06 DIAGNOSIS — N183 Chronic kidney disease, stage 3 unspecified: Secondary | ICD-10-CM | POA: Diagnosis not present

## 2020-10-06 DIAGNOSIS — Z8673 Personal history of transient ischemic attack (TIA), and cerebral infarction without residual deficits: Secondary | ICD-10-CM | POA: Diagnosis not present

## 2020-10-06 DIAGNOSIS — I130 Hypertensive heart and chronic kidney disease with heart failure and stage 1 through stage 4 chronic kidney disease, or unspecified chronic kidney disease: Secondary | ICD-10-CM | POA: Diagnosis not present

## 2020-10-06 DIAGNOSIS — I081 Rheumatic disorders of both mitral and tricuspid valves: Secondary | ICD-10-CM | POA: Diagnosis not present

## 2020-10-06 DIAGNOSIS — I701 Atherosclerosis of renal artery: Secondary | ICD-10-CM | POA: Diagnosis not present

## 2020-10-08 ENCOUNTER — Other Ambulatory Visit: Payer: Self-pay

## 2020-10-08 MED ORDER — FUROSEMIDE 20 MG PO TABS: 20.0000 mg | ORAL_TABLET | Freq: Every day | ORAL | 2 refills | Status: AC

## 2020-10-09 DIAGNOSIS — N183 Chronic kidney disease, stage 3 unspecified: Secondary | ICD-10-CM | POA: Diagnosis not present

## 2020-10-09 DIAGNOSIS — G309 Alzheimer's disease, unspecified: Secondary | ICD-10-CM | POA: Diagnosis not present

## 2020-10-09 DIAGNOSIS — I5032 Chronic diastolic (congestive) heart failure: Secondary | ICD-10-CM | POA: Diagnosis not present

## 2020-10-09 DIAGNOSIS — I672 Cerebral atherosclerosis: Secondary | ICD-10-CM | POA: Diagnosis not present

## 2020-10-09 DIAGNOSIS — F0280 Dementia in other diseases classified elsewhere without behavioral disturbance: Secondary | ICD-10-CM | POA: Diagnosis not present

## 2020-10-09 DIAGNOSIS — I130 Hypertensive heart and chronic kidney disease with heart failure and stage 1 through stage 4 chronic kidney disease, or unspecified chronic kidney disease: Secondary | ICD-10-CM | POA: Diagnosis not present

## 2020-10-10 ENCOUNTER — Telehealth: Payer: Self-pay | Admitting: Interventional Cardiology

## 2020-10-10 ENCOUNTER — Other Ambulatory Visit: Payer: Self-pay | Admitting: *Deleted

## 2020-10-10 DIAGNOSIS — I440 Atrioventricular block, first degree: Secondary | ICD-10-CM

## 2020-10-10 DIAGNOSIS — G309 Alzheimer's disease, unspecified: Secondary | ICD-10-CM | POA: Diagnosis not present

## 2020-10-10 DIAGNOSIS — F0280 Dementia in other diseases classified elsewhere without behavioral disturbance: Secondary | ICD-10-CM | POA: Diagnosis not present

## 2020-10-10 DIAGNOSIS — I5032 Chronic diastolic (congestive) heart failure: Secondary | ICD-10-CM | POA: Diagnosis not present

## 2020-10-10 DIAGNOSIS — I672 Cerebral atherosclerosis: Secondary | ICD-10-CM | POA: Diagnosis not present

## 2020-10-10 DIAGNOSIS — I480 Paroxysmal atrial fibrillation: Secondary | ICD-10-CM

## 2020-10-10 DIAGNOSIS — N183 Chronic kidney disease, stage 3 unspecified: Secondary | ICD-10-CM | POA: Diagnosis not present

## 2020-10-10 DIAGNOSIS — I130 Hypertensive heart and chronic kidney disease with heart failure and stage 1 through stage 4 chronic kidney disease, or unspecified chronic kidney disease: Secondary | ICD-10-CM | POA: Diagnosis not present

## 2020-10-10 MED ORDER — METOPROLOL SUCCINATE ER 25 MG PO TB24: 12.5000 mg | ORAL_TABLET | Freq: Every day | ORAL | 3 refills | Status: AC

## 2020-10-10 NOTE — Telephone Encounter (Signed)
STAT if HR is under 50 or over 120 (normal HR is 60-100 beats per minute)  What is your heart rate? 60  Do you have a log of your heart rate readings (document readings)? 60 on the 29th, 60 on the 27th  Do you have any other symptoms? Pt has dementia and cannot verbalize if so

## 2020-10-10 NOTE — Telephone Encounter (Signed)
Spoke with daughter and she states that Hospice nurse was in and mentioned HR 59-60, BP 116/80 and was concerned about increased weakness in pt.  Daughter's that come in to see pt had mentioned that pt was having difficulty transferring due to weakness.  Pt is not able to verbalize if she's having symptoms.  Inquired if pt grimaces like she is in pain or holds her head like she is dizzy.  Daughter states her sisters have not mentioned that but she's in [place] so she hasn't personally seen her.  Advised to start with pt seeing PCP as vitals are normal and this could be coming from multiple things including a UTI.  Daughter verbalized understanding and was appreciative for call.

## 2020-10-11 DIAGNOSIS — F0280 Dementia in other diseases classified elsewhere without behavioral disturbance: Secondary | ICD-10-CM | POA: Diagnosis not present

## 2020-10-11 DIAGNOSIS — N183 Chronic kidney disease, stage 3 unspecified: Secondary | ICD-10-CM | POA: Diagnosis not present

## 2020-10-11 DIAGNOSIS — I672 Cerebral atherosclerosis: Secondary | ICD-10-CM | POA: Diagnosis not present

## 2020-10-11 DIAGNOSIS — I5032 Chronic diastolic (congestive) heart failure: Secondary | ICD-10-CM | POA: Diagnosis not present

## 2020-10-11 DIAGNOSIS — I130 Hypertensive heart and chronic kidney disease with heart failure and stage 1 through stage 4 chronic kidney disease, or unspecified chronic kidney disease: Secondary | ICD-10-CM | POA: Diagnosis not present

## 2020-10-11 DIAGNOSIS — G309 Alzheimer's disease, unspecified: Secondary | ICD-10-CM | POA: Diagnosis not present

## 2020-10-12 DIAGNOSIS — M7581 Other shoulder lesions, right shoulder: Secondary | ICD-10-CM | POA: Diagnosis not present

## 2020-10-16 DIAGNOSIS — F0280 Dementia in other diseases classified elsewhere without behavioral disturbance: Secondary | ICD-10-CM | POA: Diagnosis not present

## 2020-10-16 DIAGNOSIS — I5032 Chronic diastolic (congestive) heart failure: Secondary | ICD-10-CM | POA: Diagnosis not present

## 2020-10-16 DIAGNOSIS — I672 Cerebral atherosclerosis: Secondary | ICD-10-CM | POA: Diagnosis not present

## 2020-10-16 DIAGNOSIS — N183 Chronic kidney disease, stage 3 unspecified: Secondary | ICD-10-CM | POA: Diagnosis not present

## 2020-10-16 DIAGNOSIS — I130 Hypertensive heart and chronic kidney disease with heart failure and stage 1 through stage 4 chronic kidney disease, or unspecified chronic kidney disease: Secondary | ICD-10-CM | POA: Diagnosis not present

## 2020-10-16 DIAGNOSIS — G309 Alzheimer's disease, unspecified: Secondary | ICD-10-CM | POA: Diagnosis not present

## 2020-10-22 DIAGNOSIS — G309 Alzheimer's disease, unspecified: Secondary | ICD-10-CM | POA: Diagnosis not present

## 2020-10-22 DIAGNOSIS — I672 Cerebral atherosclerosis: Secondary | ICD-10-CM | POA: Diagnosis not present

## 2020-10-22 DIAGNOSIS — F0280 Dementia in other diseases classified elsewhere without behavioral disturbance: Secondary | ICD-10-CM | POA: Diagnosis not present

## 2020-10-22 DIAGNOSIS — N183 Chronic kidney disease, stage 3 unspecified: Secondary | ICD-10-CM | POA: Diagnosis not present

## 2020-10-22 DIAGNOSIS — I5032 Chronic diastolic (congestive) heart failure: Secondary | ICD-10-CM | POA: Diagnosis not present

## 2020-10-22 DIAGNOSIS — I130 Hypertensive heart and chronic kidney disease with heart failure and stage 1 through stage 4 chronic kidney disease, or unspecified chronic kidney disease: Secondary | ICD-10-CM | POA: Diagnosis not present

## 2020-10-30 DIAGNOSIS — F0280 Dementia in other diseases classified elsewhere without behavioral disturbance: Secondary | ICD-10-CM | POA: Diagnosis not present

## 2020-10-30 DIAGNOSIS — I130 Hypertensive heart and chronic kidney disease with heart failure and stage 1 through stage 4 chronic kidney disease, or unspecified chronic kidney disease: Secondary | ICD-10-CM | POA: Diagnosis not present

## 2020-10-30 DIAGNOSIS — I672 Cerebral atherosclerosis: Secondary | ICD-10-CM | POA: Diagnosis not present

## 2020-10-30 DIAGNOSIS — I5032 Chronic diastolic (congestive) heart failure: Secondary | ICD-10-CM | POA: Diagnosis not present

## 2020-10-30 DIAGNOSIS — G309 Alzheimer's disease, unspecified: Secondary | ICD-10-CM | POA: Diagnosis not present

## 2020-10-30 DIAGNOSIS — N183 Chronic kidney disease, stage 3 unspecified: Secondary | ICD-10-CM | POA: Diagnosis not present

## 2020-11-02 DIAGNOSIS — F0280 Dementia in other diseases classified elsewhere without behavioral disturbance: Secondary | ICD-10-CM | POA: Diagnosis not present

## 2020-11-02 DIAGNOSIS — I5032 Chronic diastolic (congestive) heart failure: Secondary | ICD-10-CM | POA: Diagnosis not present

## 2020-11-02 DIAGNOSIS — I130 Hypertensive heart and chronic kidney disease with heart failure and stage 1 through stage 4 chronic kidney disease, or unspecified chronic kidney disease: Secondary | ICD-10-CM | POA: Diagnosis not present

## 2020-11-02 DIAGNOSIS — N183 Chronic kidney disease, stage 3 unspecified: Secondary | ICD-10-CM | POA: Diagnosis not present

## 2020-11-02 DIAGNOSIS — G309 Alzheimer's disease, unspecified: Secondary | ICD-10-CM | POA: Diagnosis not present

## 2020-11-02 DIAGNOSIS — I672 Cerebral atherosclerosis: Secondary | ICD-10-CM | POA: Diagnosis not present

## 2020-11-06 DIAGNOSIS — I480 Paroxysmal atrial fibrillation: Secondary | ICD-10-CM | POA: Diagnosis not present

## 2020-11-06 DIAGNOSIS — I773 Arterial fibromuscular dysplasia: Secondary | ICD-10-CM | POA: Diagnosis not present

## 2020-11-06 DIAGNOSIS — I081 Rheumatic disorders of both mitral and tricuspid valves: Secondary | ICD-10-CM | POA: Diagnosis not present

## 2020-11-06 DIAGNOSIS — N6019 Diffuse cystic mastopathy of unspecified breast: Secondary | ICD-10-CM | POA: Diagnosis not present

## 2020-11-06 DIAGNOSIS — Z8673 Personal history of transient ischemic attack (TIA), and cerebral infarction without residual deficits: Secondary | ICD-10-CM | POA: Diagnosis not present

## 2020-11-06 DIAGNOSIS — I130 Hypertensive heart and chronic kidney disease with heart failure and stage 1 through stage 4 chronic kidney disease, or unspecified chronic kidney disease: Secondary | ICD-10-CM | POA: Diagnosis not present

## 2020-11-06 DIAGNOSIS — Z7901 Long term (current) use of anticoagulants: Secondary | ICD-10-CM | POA: Diagnosis not present

## 2020-11-06 DIAGNOSIS — G309 Alzheimer's disease, unspecified: Secondary | ICD-10-CM | POA: Diagnosis not present

## 2020-11-06 DIAGNOSIS — I701 Atherosclerosis of renal artery: Secondary | ICD-10-CM | POA: Diagnosis not present

## 2020-11-06 DIAGNOSIS — I672 Cerebral atherosclerosis: Secondary | ICD-10-CM | POA: Diagnosis not present

## 2020-11-06 DIAGNOSIS — I5032 Chronic diastolic (congestive) heart failure: Secondary | ICD-10-CM | POA: Diagnosis not present

## 2020-11-06 DIAGNOSIS — F0280 Dementia in other diseases classified elsewhere without behavioral disturbance: Secondary | ICD-10-CM | POA: Diagnosis not present

## 2020-11-06 DIAGNOSIS — N183 Chronic kidney disease, stage 3 unspecified: Secondary | ICD-10-CM | POA: Diagnosis not present

## 2020-11-06 DIAGNOSIS — R32 Unspecified urinary incontinence: Secondary | ICD-10-CM | POA: Diagnosis not present

## 2020-11-08 DIAGNOSIS — F0280 Dementia in other diseases classified elsewhere without behavioral disturbance: Secondary | ICD-10-CM | POA: Diagnosis not present

## 2020-11-08 DIAGNOSIS — I5032 Chronic diastolic (congestive) heart failure: Secondary | ICD-10-CM | POA: Diagnosis not present

## 2020-11-08 DIAGNOSIS — I672 Cerebral atherosclerosis: Secondary | ICD-10-CM | POA: Diagnosis not present

## 2020-11-08 DIAGNOSIS — G309 Alzheimer's disease, unspecified: Secondary | ICD-10-CM | POA: Diagnosis not present

## 2020-11-08 DIAGNOSIS — I130 Hypertensive heart and chronic kidney disease with heart failure and stage 1 through stage 4 chronic kidney disease, or unspecified chronic kidney disease: Secondary | ICD-10-CM | POA: Diagnosis not present

## 2020-11-08 DIAGNOSIS — N183 Chronic kidney disease, stage 3 unspecified: Secondary | ICD-10-CM | POA: Diagnosis not present

## 2020-11-12 DIAGNOSIS — Z20828 Contact with and (suspected) exposure to other viral communicable diseases: Secondary | ICD-10-CM | POA: Diagnosis not present

## 2020-11-12 DIAGNOSIS — Z20822 Contact with and (suspected) exposure to covid-19: Secondary | ICD-10-CM | POA: Diagnosis not present

## 2020-11-13 DIAGNOSIS — I130 Hypertensive heart and chronic kidney disease with heart failure and stage 1 through stage 4 chronic kidney disease, or unspecified chronic kidney disease: Secondary | ICD-10-CM | POA: Diagnosis not present

## 2020-11-13 DIAGNOSIS — F0280 Dementia in other diseases classified elsewhere without behavioral disturbance: Secondary | ICD-10-CM | POA: Diagnosis not present

## 2020-11-13 DIAGNOSIS — G309 Alzheimer's disease, unspecified: Secondary | ICD-10-CM | POA: Diagnosis not present

## 2020-11-13 DIAGNOSIS — N183 Chronic kidney disease, stage 3 unspecified: Secondary | ICD-10-CM | POA: Diagnosis not present

## 2020-11-13 DIAGNOSIS — I672 Cerebral atherosclerosis: Secondary | ICD-10-CM | POA: Diagnosis not present

## 2020-11-13 DIAGNOSIS — I5032 Chronic diastolic (congestive) heart failure: Secondary | ICD-10-CM | POA: Diagnosis not present

## 2020-11-20 DIAGNOSIS — I130 Hypertensive heart and chronic kidney disease with heart failure and stage 1 through stage 4 chronic kidney disease, or unspecified chronic kidney disease: Secondary | ICD-10-CM | POA: Diagnosis not present

## 2020-11-20 DIAGNOSIS — I5032 Chronic diastolic (congestive) heart failure: Secondary | ICD-10-CM | POA: Diagnosis not present

## 2020-11-20 DIAGNOSIS — F0280 Dementia in other diseases classified elsewhere without behavioral disturbance: Secondary | ICD-10-CM | POA: Diagnosis not present

## 2020-11-20 DIAGNOSIS — G309 Alzheimer's disease, unspecified: Secondary | ICD-10-CM | POA: Diagnosis not present

## 2020-11-20 DIAGNOSIS — I672 Cerebral atherosclerosis: Secondary | ICD-10-CM | POA: Diagnosis not present

## 2020-11-20 DIAGNOSIS — N183 Chronic kidney disease, stage 3 unspecified: Secondary | ICD-10-CM | POA: Diagnosis not present

## 2020-11-21 DIAGNOSIS — G309 Alzheimer's disease, unspecified: Secondary | ICD-10-CM | POA: Diagnosis not present

## 2020-11-21 DIAGNOSIS — F0280 Dementia in other diseases classified elsewhere without behavioral disturbance: Secondary | ICD-10-CM | POA: Diagnosis not present

## 2020-11-21 DIAGNOSIS — I130 Hypertensive heart and chronic kidney disease with heart failure and stage 1 through stage 4 chronic kidney disease, or unspecified chronic kidney disease: Secondary | ICD-10-CM | POA: Diagnosis not present

## 2020-11-21 DIAGNOSIS — I5032 Chronic diastolic (congestive) heart failure: Secondary | ICD-10-CM | POA: Diagnosis not present

## 2020-11-21 DIAGNOSIS — I672 Cerebral atherosclerosis: Secondary | ICD-10-CM | POA: Diagnosis not present

## 2020-11-21 DIAGNOSIS — N183 Chronic kidney disease, stage 3 unspecified: Secondary | ICD-10-CM | POA: Diagnosis not present

## 2020-11-23 DIAGNOSIS — E782 Mixed hyperlipidemia: Secondary | ICD-10-CM | POA: Diagnosis not present

## 2020-11-23 DIAGNOSIS — E119 Type 2 diabetes mellitus without complications: Secondary | ICD-10-CM | POA: Diagnosis not present

## 2020-11-23 DIAGNOSIS — Z23 Encounter for immunization: Secondary | ICD-10-CM | POA: Diagnosis not present

## 2020-11-23 DIAGNOSIS — F32A Depression, unspecified: Secondary | ICD-10-CM | POA: Diagnosis not present

## 2020-11-23 DIAGNOSIS — M5136 Other intervertebral disc degeneration, lumbar region: Secondary | ICD-10-CM | POA: Diagnosis not present

## 2020-11-23 DIAGNOSIS — F0390 Unspecified dementia without behavioral disturbance: Secondary | ICD-10-CM | POA: Diagnosis not present

## 2020-11-23 DIAGNOSIS — R32 Unspecified urinary incontinence: Secondary | ICD-10-CM | POA: Diagnosis not present

## 2020-11-23 DIAGNOSIS — I700 Atherosclerosis of aorta: Secondary | ICD-10-CM | POA: Diagnosis not present

## 2020-11-26 DIAGNOSIS — I672 Cerebral atherosclerosis: Secondary | ICD-10-CM | POA: Diagnosis not present

## 2020-11-26 DIAGNOSIS — N183 Chronic kidney disease, stage 3 unspecified: Secondary | ICD-10-CM | POA: Diagnosis not present

## 2020-11-26 DIAGNOSIS — F0280 Dementia in other diseases classified elsewhere without behavioral disturbance: Secondary | ICD-10-CM | POA: Diagnosis not present

## 2020-11-26 DIAGNOSIS — I130 Hypertensive heart and chronic kidney disease with heart failure and stage 1 through stage 4 chronic kidney disease, or unspecified chronic kidney disease: Secondary | ICD-10-CM | POA: Diagnosis not present

## 2020-11-26 DIAGNOSIS — G309 Alzheimer's disease, unspecified: Secondary | ICD-10-CM | POA: Diagnosis not present

## 2020-11-26 DIAGNOSIS — I5032 Chronic diastolic (congestive) heart failure: Secondary | ICD-10-CM | POA: Diagnosis not present

## 2020-11-28 DIAGNOSIS — I5032 Chronic diastolic (congestive) heart failure: Secondary | ICD-10-CM | POA: Diagnosis not present

## 2020-11-28 DIAGNOSIS — F0280 Dementia in other diseases classified elsewhere without behavioral disturbance: Secondary | ICD-10-CM | POA: Diagnosis not present

## 2020-11-28 DIAGNOSIS — I672 Cerebral atherosclerosis: Secondary | ICD-10-CM | POA: Diagnosis not present

## 2020-11-28 DIAGNOSIS — N183 Chronic kidney disease, stage 3 unspecified: Secondary | ICD-10-CM | POA: Diagnosis not present

## 2020-11-28 DIAGNOSIS — G309 Alzheimer's disease, unspecified: Secondary | ICD-10-CM | POA: Diagnosis not present

## 2020-11-28 DIAGNOSIS — I130 Hypertensive heart and chronic kidney disease with heart failure and stage 1 through stage 4 chronic kidney disease, or unspecified chronic kidney disease: Secondary | ICD-10-CM | POA: Diagnosis not present

## 2020-12-04 DIAGNOSIS — N183 Chronic kidney disease, stage 3 unspecified: Secondary | ICD-10-CM | POA: Diagnosis not present

## 2020-12-04 DIAGNOSIS — I672 Cerebral atherosclerosis: Secondary | ICD-10-CM | POA: Diagnosis not present

## 2020-12-04 DIAGNOSIS — F0280 Dementia in other diseases classified elsewhere without behavioral disturbance: Secondary | ICD-10-CM | POA: Diagnosis not present

## 2020-12-04 DIAGNOSIS — I130 Hypertensive heart and chronic kidney disease with heart failure and stage 1 through stage 4 chronic kidney disease, or unspecified chronic kidney disease: Secondary | ICD-10-CM | POA: Diagnosis not present

## 2020-12-04 DIAGNOSIS — I5032 Chronic diastolic (congestive) heart failure: Secondary | ICD-10-CM | POA: Diagnosis not present

## 2020-12-04 DIAGNOSIS — G309 Alzheimer's disease, unspecified: Secondary | ICD-10-CM | POA: Diagnosis not present

## 2020-12-06 DIAGNOSIS — I081 Rheumatic disorders of both mitral and tricuspid valves: Secondary | ICD-10-CM | POA: Diagnosis not present

## 2020-12-06 DIAGNOSIS — Z8673 Personal history of transient ischemic attack (TIA), and cerebral infarction without residual deficits: Secondary | ICD-10-CM | POA: Diagnosis not present

## 2020-12-06 DIAGNOSIS — Z7901 Long term (current) use of anticoagulants: Secondary | ICD-10-CM | POA: Diagnosis not present

## 2020-12-06 DIAGNOSIS — R32 Unspecified urinary incontinence: Secondary | ICD-10-CM | POA: Diagnosis not present

## 2020-12-06 DIAGNOSIS — I5032 Chronic diastolic (congestive) heart failure: Secondary | ICD-10-CM | POA: Diagnosis not present

## 2020-12-06 DIAGNOSIS — I672 Cerebral atherosclerosis: Secondary | ICD-10-CM | POA: Diagnosis not present

## 2020-12-06 DIAGNOSIS — I701 Atherosclerosis of renal artery: Secondary | ICD-10-CM | POA: Diagnosis not present

## 2020-12-06 DIAGNOSIS — N183 Chronic kidney disease, stage 3 unspecified: Secondary | ICD-10-CM | POA: Diagnosis not present

## 2020-12-06 DIAGNOSIS — N6019 Diffuse cystic mastopathy of unspecified breast: Secondary | ICD-10-CM | POA: Diagnosis not present

## 2020-12-06 DIAGNOSIS — F0280 Dementia in other diseases classified elsewhere without behavioral disturbance: Secondary | ICD-10-CM | POA: Diagnosis not present

## 2020-12-06 DIAGNOSIS — I480 Paroxysmal atrial fibrillation: Secondary | ICD-10-CM | POA: Diagnosis not present

## 2020-12-06 DIAGNOSIS — G309 Alzheimer's disease, unspecified: Secondary | ICD-10-CM | POA: Diagnosis not present

## 2020-12-06 DIAGNOSIS — I130 Hypertensive heart and chronic kidney disease with heart failure and stage 1 through stage 4 chronic kidney disease, or unspecified chronic kidney disease: Secondary | ICD-10-CM | POA: Diagnosis not present

## 2020-12-06 DIAGNOSIS — I773 Arterial fibromuscular dysplasia: Secondary | ICD-10-CM | POA: Diagnosis not present

## 2020-12-07 DIAGNOSIS — G309 Alzheimer's disease, unspecified: Secondary | ICD-10-CM | POA: Diagnosis not present

## 2020-12-07 DIAGNOSIS — I5032 Chronic diastolic (congestive) heart failure: Secondary | ICD-10-CM | POA: Diagnosis not present

## 2020-12-07 DIAGNOSIS — F0280 Dementia in other diseases classified elsewhere without behavioral disturbance: Secondary | ICD-10-CM | POA: Diagnosis not present

## 2020-12-07 DIAGNOSIS — I130 Hypertensive heart and chronic kidney disease with heart failure and stage 1 through stage 4 chronic kidney disease, or unspecified chronic kidney disease: Secondary | ICD-10-CM | POA: Diagnosis not present

## 2020-12-07 DIAGNOSIS — I672 Cerebral atherosclerosis: Secondary | ICD-10-CM | POA: Diagnosis not present

## 2020-12-07 DIAGNOSIS — N183 Chronic kidney disease, stage 3 unspecified: Secondary | ICD-10-CM | POA: Diagnosis not present

## 2020-12-12 DIAGNOSIS — G309 Alzheimer's disease, unspecified: Secondary | ICD-10-CM | POA: Diagnosis not present

## 2020-12-12 DIAGNOSIS — N183 Chronic kidney disease, stage 3 unspecified: Secondary | ICD-10-CM | POA: Diagnosis not present

## 2020-12-12 DIAGNOSIS — F0280 Dementia in other diseases classified elsewhere without behavioral disturbance: Secondary | ICD-10-CM | POA: Diagnosis not present

## 2020-12-12 DIAGNOSIS — I5032 Chronic diastolic (congestive) heart failure: Secondary | ICD-10-CM | POA: Diagnosis not present

## 2020-12-12 DIAGNOSIS — I130 Hypertensive heart and chronic kidney disease with heart failure and stage 1 through stage 4 chronic kidney disease, or unspecified chronic kidney disease: Secondary | ICD-10-CM | POA: Diagnosis not present

## 2020-12-12 DIAGNOSIS — I672 Cerebral atherosclerosis: Secondary | ICD-10-CM | POA: Diagnosis not present

## 2020-12-14 ENCOUNTER — Ambulatory Visit: Payer: BLUE CROSS/BLUE SHIELD | Admitting: Interventional Cardiology

## 2020-12-14 DIAGNOSIS — G309 Alzheimer's disease, unspecified: Secondary | ICD-10-CM | POA: Diagnosis not present

## 2020-12-14 DIAGNOSIS — I130 Hypertensive heart and chronic kidney disease with heart failure and stage 1 through stage 4 chronic kidney disease, or unspecified chronic kidney disease: Secondary | ICD-10-CM | POA: Diagnosis not present

## 2020-12-14 DIAGNOSIS — N183 Chronic kidney disease, stage 3 unspecified: Secondary | ICD-10-CM | POA: Diagnosis not present

## 2020-12-14 DIAGNOSIS — F0280 Dementia in other diseases classified elsewhere without behavioral disturbance: Secondary | ICD-10-CM | POA: Diagnosis not present

## 2020-12-14 DIAGNOSIS — I5032 Chronic diastolic (congestive) heart failure: Secondary | ICD-10-CM | POA: Diagnosis not present

## 2020-12-14 DIAGNOSIS — I672 Cerebral atherosclerosis: Secondary | ICD-10-CM | POA: Diagnosis not present

## 2020-12-16 DIAGNOSIS — N183 Chronic kidney disease, stage 3 unspecified: Secondary | ICD-10-CM | POA: Diagnosis not present

## 2020-12-16 DIAGNOSIS — F0280 Dementia in other diseases classified elsewhere without behavioral disturbance: Secondary | ICD-10-CM | POA: Diagnosis not present

## 2020-12-16 DIAGNOSIS — I672 Cerebral atherosclerosis: Secondary | ICD-10-CM | POA: Diagnosis not present

## 2020-12-16 DIAGNOSIS — G309 Alzheimer's disease, unspecified: Secondary | ICD-10-CM | POA: Diagnosis not present

## 2020-12-16 DIAGNOSIS — I5032 Chronic diastolic (congestive) heart failure: Secondary | ICD-10-CM | POA: Diagnosis not present

## 2020-12-16 DIAGNOSIS — I130 Hypertensive heart and chronic kidney disease with heart failure and stage 1 through stage 4 chronic kidney disease, or unspecified chronic kidney disease: Secondary | ICD-10-CM | POA: Diagnosis not present

## 2020-12-17 DIAGNOSIS — I130 Hypertensive heart and chronic kidney disease with heart failure and stage 1 through stage 4 chronic kidney disease, or unspecified chronic kidney disease: Secondary | ICD-10-CM | POA: Diagnosis not present

## 2020-12-17 DIAGNOSIS — G309 Alzheimer's disease, unspecified: Secondary | ICD-10-CM | POA: Diagnosis not present

## 2020-12-17 DIAGNOSIS — F0280 Dementia in other diseases classified elsewhere without behavioral disturbance: Secondary | ICD-10-CM | POA: Diagnosis not present

## 2020-12-17 DIAGNOSIS — I672 Cerebral atherosclerosis: Secondary | ICD-10-CM | POA: Diagnosis not present

## 2020-12-17 DIAGNOSIS — I5032 Chronic diastolic (congestive) heart failure: Secondary | ICD-10-CM | POA: Diagnosis not present

## 2020-12-17 DIAGNOSIS — N183 Chronic kidney disease, stage 3 unspecified: Secondary | ICD-10-CM | POA: Diagnosis not present

## 2020-12-20 DIAGNOSIS — G309 Alzheimer's disease, unspecified: Secondary | ICD-10-CM | POA: Diagnosis not present

## 2020-12-20 DIAGNOSIS — F0280 Dementia in other diseases classified elsewhere without behavioral disturbance: Secondary | ICD-10-CM | POA: Diagnosis not present

## 2020-12-20 DIAGNOSIS — N183 Chronic kidney disease, stage 3 unspecified: Secondary | ICD-10-CM | POA: Diagnosis not present

## 2020-12-20 DIAGNOSIS — I672 Cerebral atherosclerosis: Secondary | ICD-10-CM | POA: Diagnosis not present

## 2020-12-20 DIAGNOSIS — I130 Hypertensive heart and chronic kidney disease with heart failure and stage 1 through stage 4 chronic kidney disease, or unspecified chronic kidney disease: Secondary | ICD-10-CM | POA: Diagnosis not present

## 2020-12-20 DIAGNOSIS — I5032 Chronic diastolic (congestive) heart failure: Secondary | ICD-10-CM | POA: Diagnosis not present

## 2020-12-25 DIAGNOSIS — I672 Cerebral atherosclerosis: Secondary | ICD-10-CM | POA: Diagnosis not present

## 2020-12-25 DIAGNOSIS — F0280 Dementia in other diseases classified elsewhere without behavioral disturbance: Secondary | ICD-10-CM | POA: Diagnosis not present

## 2020-12-25 DIAGNOSIS — I130 Hypertensive heart and chronic kidney disease with heart failure and stage 1 through stage 4 chronic kidney disease, or unspecified chronic kidney disease: Secondary | ICD-10-CM | POA: Diagnosis not present

## 2020-12-25 DIAGNOSIS — G309 Alzheimer's disease, unspecified: Secondary | ICD-10-CM | POA: Diagnosis not present

## 2020-12-25 DIAGNOSIS — N183 Chronic kidney disease, stage 3 unspecified: Secondary | ICD-10-CM | POA: Diagnosis not present

## 2020-12-25 DIAGNOSIS — I5032 Chronic diastolic (congestive) heart failure: Secondary | ICD-10-CM | POA: Diagnosis not present

## 2020-12-28 DIAGNOSIS — I672 Cerebral atherosclerosis: Secondary | ICD-10-CM | POA: Diagnosis not present

## 2020-12-28 DIAGNOSIS — G309 Alzheimer's disease, unspecified: Secondary | ICD-10-CM | POA: Diagnosis not present

## 2020-12-28 DIAGNOSIS — F0280 Dementia in other diseases classified elsewhere without behavioral disturbance: Secondary | ICD-10-CM | POA: Diagnosis not present

## 2020-12-28 DIAGNOSIS — I5032 Chronic diastolic (congestive) heart failure: Secondary | ICD-10-CM | POA: Diagnosis not present

## 2020-12-28 DIAGNOSIS — N183 Chronic kidney disease, stage 3 unspecified: Secondary | ICD-10-CM | POA: Diagnosis not present

## 2020-12-28 DIAGNOSIS — I130 Hypertensive heart and chronic kidney disease with heart failure and stage 1 through stage 4 chronic kidney disease, or unspecified chronic kidney disease: Secondary | ICD-10-CM | POA: Diagnosis not present

## 2021-01-02 DIAGNOSIS — N183 Chronic kidney disease, stage 3 unspecified: Secondary | ICD-10-CM | POA: Diagnosis not present

## 2021-01-02 DIAGNOSIS — G309 Alzheimer's disease, unspecified: Secondary | ICD-10-CM | POA: Diagnosis not present

## 2021-01-02 DIAGNOSIS — F0280 Dementia in other diseases classified elsewhere without behavioral disturbance: Secondary | ICD-10-CM | POA: Diagnosis not present

## 2021-01-02 DIAGNOSIS — I130 Hypertensive heart and chronic kidney disease with heart failure and stage 1 through stage 4 chronic kidney disease, or unspecified chronic kidney disease: Secondary | ICD-10-CM | POA: Diagnosis not present

## 2021-01-02 DIAGNOSIS — I672 Cerebral atherosclerosis: Secondary | ICD-10-CM | POA: Diagnosis not present

## 2021-01-02 DIAGNOSIS — I5032 Chronic diastolic (congestive) heart failure: Secondary | ICD-10-CM | POA: Diagnosis not present

## 2021-01-04 DIAGNOSIS — N183 Chronic kidney disease, stage 3 unspecified: Secondary | ICD-10-CM | POA: Diagnosis not present

## 2021-01-04 DIAGNOSIS — I5032 Chronic diastolic (congestive) heart failure: Secondary | ICD-10-CM | POA: Diagnosis not present

## 2021-01-04 DIAGNOSIS — F0280 Dementia in other diseases classified elsewhere without behavioral disturbance: Secondary | ICD-10-CM | POA: Diagnosis not present

## 2021-01-04 DIAGNOSIS — G309 Alzheimer's disease, unspecified: Secondary | ICD-10-CM | POA: Diagnosis not present

## 2021-01-04 DIAGNOSIS — I672 Cerebral atherosclerosis: Secondary | ICD-10-CM | POA: Diagnosis not present

## 2021-01-04 DIAGNOSIS — I130 Hypertensive heart and chronic kidney disease with heart failure and stage 1 through stage 4 chronic kidney disease, or unspecified chronic kidney disease: Secondary | ICD-10-CM | POA: Diagnosis not present

## 2021-01-06 DIAGNOSIS — Z7901 Long term (current) use of anticoagulants: Secondary | ICD-10-CM | POA: Diagnosis not present

## 2021-01-06 DIAGNOSIS — I081 Rheumatic disorders of both mitral and tricuspid valves: Secondary | ICD-10-CM | POA: Diagnosis not present

## 2021-01-06 DIAGNOSIS — I672 Cerebral atherosclerosis: Secondary | ICD-10-CM | POA: Diagnosis not present

## 2021-01-06 DIAGNOSIS — R32 Unspecified urinary incontinence: Secondary | ICD-10-CM | POA: Diagnosis not present

## 2021-01-06 DIAGNOSIS — N183 Chronic kidney disease, stage 3 unspecified: Secondary | ICD-10-CM | POA: Diagnosis not present

## 2021-01-06 DIAGNOSIS — I5032 Chronic diastolic (congestive) heart failure: Secondary | ICD-10-CM | POA: Diagnosis not present

## 2021-01-06 DIAGNOSIS — F0280 Dementia in other diseases classified elsewhere without behavioral disturbance: Secondary | ICD-10-CM | POA: Diagnosis not present

## 2021-01-06 DIAGNOSIS — Z66 Do not resuscitate: Secondary | ICD-10-CM | POA: Diagnosis not present

## 2021-01-06 DIAGNOSIS — I480 Paroxysmal atrial fibrillation: Secondary | ICD-10-CM | POA: Diagnosis not present

## 2021-01-06 DIAGNOSIS — I701 Atherosclerosis of renal artery: Secondary | ICD-10-CM | POA: Diagnosis not present

## 2021-01-06 DIAGNOSIS — G309 Alzheimer's disease, unspecified: Secondary | ICD-10-CM | POA: Diagnosis not present

## 2021-01-06 DIAGNOSIS — I130 Hypertensive heart and chronic kidney disease with heart failure and stage 1 through stage 4 chronic kidney disease, or unspecified chronic kidney disease: Secondary | ICD-10-CM | POA: Diagnosis not present

## 2021-01-06 DIAGNOSIS — N6019 Diffuse cystic mastopathy of unspecified breast: Secondary | ICD-10-CM | POA: Diagnosis not present

## 2021-01-06 DIAGNOSIS — Z8673 Personal history of transient ischemic attack (TIA), and cerebral infarction without residual deficits: Secondary | ICD-10-CM | POA: Diagnosis not present

## 2021-01-06 DIAGNOSIS — I773 Arterial fibromuscular dysplasia: Secondary | ICD-10-CM | POA: Diagnosis not present

## 2021-01-08 DIAGNOSIS — F0280 Dementia in other diseases classified elsewhere without behavioral disturbance: Secondary | ICD-10-CM | POA: Diagnosis not present

## 2021-01-08 DIAGNOSIS — I672 Cerebral atherosclerosis: Secondary | ICD-10-CM | POA: Diagnosis not present

## 2021-01-08 DIAGNOSIS — N183 Chronic kidney disease, stage 3 unspecified: Secondary | ICD-10-CM | POA: Diagnosis not present

## 2021-01-08 DIAGNOSIS — I130 Hypertensive heart and chronic kidney disease with heart failure and stage 1 through stage 4 chronic kidney disease, or unspecified chronic kidney disease: Secondary | ICD-10-CM | POA: Diagnosis not present

## 2021-01-08 DIAGNOSIS — G309 Alzheimer's disease, unspecified: Secondary | ICD-10-CM | POA: Diagnosis not present

## 2021-01-08 DIAGNOSIS — I5032 Chronic diastolic (congestive) heart failure: Secondary | ICD-10-CM | POA: Diagnosis not present

## 2021-01-10 DIAGNOSIS — G309 Alzheimer's disease, unspecified: Secondary | ICD-10-CM | POA: Diagnosis not present

## 2021-01-10 DIAGNOSIS — F0280 Dementia in other diseases classified elsewhere without behavioral disturbance: Secondary | ICD-10-CM | POA: Diagnosis not present

## 2021-01-10 DIAGNOSIS — I130 Hypertensive heart and chronic kidney disease with heart failure and stage 1 through stage 4 chronic kidney disease, or unspecified chronic kidney disease: Secondary | ICD-10-CM | POA: Diagnosis not present

## 2021-01-10 DIAGNOSIS — I5032 Chronic diastolic (congestive) heart failure: Secondary | ICD-10-CM | POA: Diagnosis not present

## 2021-01-10 DIAGNOSIS — I672 Cerebral atherosclerosis: Secondary | ICD-10-CM | POA: Diagnosis not present

## 2021-01-10 DIAGNOSIS — N183 Chronic kidney disease, stage 3 unspecified: Secondary | ICD-10-CM | POA: Diagnosis not present

## 2021-01-11 DIAGNOSIS — I5032 Chronic diastolic (congestive) heart failure: Secondary | ICD-10-CM | POA: Diagnosis not present

## 2021-01-11 DIAGNOSIS — I672 Cerebral atherosclerosis: Secondary | ICD-10-CM | POA: Diagnosis not present

## 2021-01-11 DIAGNOSIS — F0280 Dementia in other diseases classified elsewhere without behavioral disturbance: Secondary | ICD-10-CM | POA: Diagnosis not present

## 2021-01-11 DIAGNOSIS — I130 Hypertensive heart and chronic kidney disease with heart failure and stage 1 through stage 4 chronic kidney disease, or unspecified chronic kidney disease: Secondary | ICD-10-CM | POA: Diagnosis not present

## 2021-01-11 DIAGNOSIS — G309 Alzheimer's disease, unspecified: Secondary | ICD-10-CM | POA: Diagnosis not present

## 2021-01-11 DIAGNOSIS — N183 Chronic kidney disease, stage 3 unspecified: Secondary | ICD-10-CM | POA: Diagnosis not present

## 2021-01-12 DIAGNOSIS — N183 Chronic kidney disease, stage 3 unspecified: Secondary | ICD-10-CM | POA: Diagnosis not present

## 2021-01-12 DIAGNOSIS — I672 Cerebral atherosclerosis: Secondary | ICD-10-CM | POA: Diagnosis not present

## 2021-01-12 DIAGNOSIS — I5032 Chronic diastolic (congestive) heart failure: Secondary | ICD-10-CM | POA: Diagnosis not present

## 2021-01-12 DIAGNOSIS — F0280 Dementia in other diseases classified elsewhere without behavioral disturbance: Secondary | ICD-10-CM | POA: Diagnosis not present

## 2021-01-12 DIAGNOSIS — I130 Hypertensive heart and chronic kidney disease with heart failure and stage 1 through stage 4 chronic kidney disease, or unspecified chronic kidney disease: Secondary | ICD-10-CM | POA: Diagnosis not present

## 2021-01-12 DIAGNOSIS — G309 Alzheimer's disease, unspecified: Secondary | ICD-10-CM | POA: Diagnosis not present

## 2021-01-13 DIAGNOSIS — F0280 Dementia in other diseases classified elsewhere without behavioral disturbance: Secondary | ICD-10-CM | POA: Diagnosis not present

## 2021-01-13 DIAGNOSIS — I672 Cerebral atherosclerosis: Secondary | ICD-10-CM | POA: Diagnosis not present

## 2021-01-13 DIAGNOSIS — I5032 Chronic diastolic (congestive) heart failure: Secondary | ICD-10-CM | POA: Diagnosis not present

## 2021-01-13 DIAGNOSIS — G309 Alzheimer's disease, unspecified: Secondary | ICD-10-CM | POA: Diagnosis not present

## 2021-01-13 DIAGNOSIS — I130 Hypertensive heart and chronic kidney disease with heart failure and stage 1 through stage 4 chronic kidney disease, or unspecified chronic kidney disease: Secondary | ICD-10-CM | POA: Diagnosis not present

## 2021-01-13 DIAGNOSIS — N183 Chronic kidney disease, stage 3 unspecified: Secondary | ICD-10-CM | POA: Diagnosis not present

## 2021-01-14 DIAGNOSIS — G309 Alzheimer's disease, unspecified: Secondary | ICD-10-CM | POA: Diagnosis not present

## 2021-01-14 DIAGNOSIS — I130 Hypertensive heart and chronic kidney disease with heart failure and stage 1 through stage 4 chronic kidney disease, or unspecified chronic kidney disease: Secondary | ICD-10-CM | POA: Diagnosis not present

## 2021-01-14 DIAGNOSIS — F0280 Dementia in other diseases classified elsewhere without behavioral disturbance: Secondary | ICD-10-CM | POA: Diagnosis not present

## 2021-01-14 DIAGNOSIS — I672 Cerebral atherosclerosis: Secondary | ICD-10-CM | POA: Diagnosis not present

## 2021-01-14 DIAGNOSIS — I5032 Chronic diastolic (congestive) heart failure: Secondary | ICD-10-CM | POA: Diagnosis not present

## 2021-01-14 DIAGNOSIS — N183 Chronic kidney disease, stage 3 unspecified: Secondary | ICD-10-CM | POA: Diagnosis not present

## 2021-01-15 DIAGNOSIS — F0280 Dementia in other diseases classified elsewhere without behavioral disturbance: Secondary | ICD-10-CM | POA: Diagnosis not present

## 2021-01-15 DIAGNOSIS — I5032 Chronic diastolic (congestive) heart failure: Secondary | ICD-10-CM | POA: Diagnosis not present

## 2021-01-15 DIAGNOSIS — I130 Hypertensive heart and chronic kidney disease with heart failure and stage 1 through stage 4 chronic kidney disease, or unspecified chronic kidney disease: Secondary | ICD-10-CM | POA: Diagnosis not present

## 2021-01-15 DIAGNOSIS — G309 Alzheimer's disease, unspecified: Secondary | ICD-10-CM | POA: Diagnosis not present

## 2021-01-15 DIAGNOSIS — I672 Cerebral atherosclerosis: Secondary | ICD-10-CM | POA: Diagnosis not present

## 2021-01-15 DIAGNOSIS — N183 Chronic kidney disease, stage 3 unspecified: Secondary | ICD-10-CM | POA: Diagnosis not present

## 2021-01-16 ENCOUNTER — Other Ambulatory Visit: Payer: Self-pay | Admitting: Interventional Cardiology

## 2021-01-16 DIAGNOSIS — I480 Paroxysmal atrial fibrillation: Secondary | ICD-10-CM

## 2021-01-16 DIAGNOSIS — F0280 Dementia in other diseases classified elsewhere without behavioral disturbance: Secondary | ICD-10-CM | POA: Diagnosis not present

## 2021-01-16 DIAGNOSIS — N183 Chronic kidney disease, stage 3 unspecified: Secondary | ICD-10-CM | POA: Diagnosis not present

## 2021-01-16 DIAGNOSIS — I672 Cerebral atherosclerosis: Secondary | ICD-10-CM | POA: Diagnosis not present

## 2021-01-16 DIAGNOSIS — I5032 Chronic diastolic (congestive) heart failure: Secondary | ICD-10-CM | POA: Diagnosis not present

## 2021-01-16 DIAGNOSIS — I130 Hypertensive heart and chronic kidney disease with heart failure and stage 1 through stage 4 chronic kidney disease, or unspecified chronic kidney disease: Secondary | ICD-10-CM | POA: Diagnosis not present

## 2021-01-16 DIAGNOSIS — G309 Alzheimer's disease, unspecified: Secondary | ICD-10-CM | POA: Diagnosis not present

## 2021-01-17 DIAGNOSIS — I672 Cerebral atherosclerosis: Secondary | ICD-10-CM | POA: Diagnosis not present

## 2021-01-17 DIAGNOSIS — F0280 Dementia in other diseases classified elsewhere without behavioral disturbance: Secondary | ICD-10-CM | POA: Diagnosis not present

## 2021-01-17 DIAGNOSIS — G309 Alzheimer's disease, unspecified: Secondary | ICD-10-CM | POA: Diagnosis not present

## 2021-01-17 DIAGNOSIS — I5032 Chronic diastolic (congestive) heart failure: Secondary | ICD-10-CM | POA: Diagnosis not present

## 2021-01-17 DIAGNOSIS — I130 Hypertensive heart and chronic kidney disease with heart failure and stage 1 through stage 4 chronic kidney disease, or unspecified chronic kidney disease: Secondary | ICD-10-CM | POA: Diagnosis not present

## 2021-01-17 DIAGNOSIS — N183 Chronic kidney disease, stage 3 unspecified: Secondary | ICD-10-CM | POA: Diagnosis not present

## 2021-01-18 DIAGNOSIS — I130 Hypertensive heart and chronic kidney disease with heart failure and stage 1 through stage 4 chronic kidney disease, or unspecified chronic kidney disease: Secondary | ICD-10-CM | POA: Diagnosis not present

## 2021-01-18 DIAGNOSIS — N183 Chronic kidney disease, stage 3 unspecified: Secondary | ICD-10-CM | POA: Diagnosis not present

## 2021-01-18 DIAGNOSIS — F0280 Dementia in other diseases classified elsewhere without behavioral disturbance: Secondary | ICD-10-CM | POA: Diagnosis not present

## 2021-01-18 DIAGNOSIS — G309 Alzheimer's disease, unspecified: Secondary | ICD-10-CM | POA: Diagnosis not present

## 2021-01-18 DIAGNOSIS — I672 Cerebral atherosclerosis: Secondary | ICD-10-CM | POA: Diagnosis not present

## 2021-01-18 DIAGNOSIS — I5032 Chronic diastolic (congestive) heart failure: Secondary | ICD-10-CM | POA: Diagnosis not present

## 2021-01-19 DIAGNOSIS — N183 Chronic kidney disease, stage 3 unspecified: Secondary | ICD-10-CM | POA: Diagnosis not present

## 2021-01-19 DIAGNOSIS — I5032 Chronic diastolic (congestive) heart failure: Secondary | ICD-10-CM | POA: Diagnosis not present

## 2021-01-19 DIAGNOSIS — I130 Hypertensive heart and chronic kidney disease with heart failure and stage 1 through stage 4 chronic kidney disease, or unspecified chronic kidney disease: Secondary | ICD-10-CM | POA: Diagnosis not present

## 2021-01-19 DIAGNOSIS — I672 Cerebral atherosclerosis: Secondary | ICD-10-CM | POA: Diagnosis not present

## 2021-01-19 DIAGNOSIS — F0280 Dementia in other diseases classified elsewhere without behavioral disturbance: Secondary | ICD-10-CM | POA: Diagnosis not present

## 2021-01-19 DIAGNOSIS — G309 Alzheimer's disease, unspecified: Secondary | ICD-10-CM | POA: Diagnosis not present

## 2021-01-20 DIAGNOSIS — N183 Chronic kidney disease, stage 3 unspecified: Secondary | ICD-10-CM | POA: Diagnosis not present

## 2021-01-20 DIAGNOSIS — I672 Cerebral atherosclerosis: Secondary | ICD-10-CM | POA: Diagnosis not present

## 2021-01-20 DIAGNOSIS — F0280 Dementia in other diseases classified elsewhere without behavioral disturbance: Secondary | ICD-10-CM | POA: Diagnosis not present

## 2021-01-20 DIAGNOSIS — I5032 Chronic diastolic (congestive) heart failure: Secondary | ICD-10-CM | POA: Diagnosis not present

## 2021-01-20 DIAGNOSIS — G309 Alzheimer's disease, unspecified: Secondary | ICD-10-CM | POA: Diagnosis not present

## 2021-01-20 DIAGNOSIS — I130 Hypertensive heart and chronic kidney disease with heart failure and stage 1 through stage 4 chronic kidney disease, or unspecified chronic kidney disease: Secondary | ICD-10-CM | POA: Diagnosis not present

## 2021-01-21 DIAGNOSIS — G309 Alzheimer's disease, unspecified: Secondary | ICD-10-CM | POA: Diagnosis not present

## 2021-01-21 DIAGNOSIS — I5032 Chronic diastolic (congestive) heart failure: Secondary | ICD-10-CM | POA: Diagnosis not present

## 2021-01-21 DIAGNOSIS — I130 Hypertensive heart and chronic kidney disease with heart failure and stage 1 through stage 4 chronic kidney disease, or unspecified chronic kidney disease: Secondary | ICD-10-CM | POA: Diagnosis not present

## 2021-01-21 DIAGNOSIS — I672 Cerebral atherosclerosis: Secondary | ICD-10-CM | POA: Diagnosis not present

## 2021-01-21 DIAGNOSIS — F0280 Dementia in other diseases classified elsewhere without behavioral disturbance: Secondary | ICD-10-CM | POA: Diagnosis not present

## 2021-01-21 DIAGNOSIS — N183 Chronic kidney disease, stage 3 unspecified: Secondary | ICD-10-CM | POA: Diagnosis not present

## 2021-01-23 DIAGNOSIS — I672 Cerebral atherosclerosis: Secondary | ICD-10-CM | POA: Diagnosis not present

## 2021-01-23 DIAGNOSIS — G309 Alzheimer's disease, unspecified: Secondary | ICD-10-CM | POA: Diagnosis not present

## 2021-01-23 DIAGNOSIS — I5032 Chronic diastolic (congestive) heart failure: Secondary | ICD-10-CM | POA: Diagnosis not present

## 2021-01-23 DIAGNOSIS — F0280 Dementia in other diseases classified elsewhere without behavioral disturbance: Secondary | ICD-10-CM | POA: Diagnosis not present

## 2021-01-23 DIAGNOSIS — I130 Hypertensive heart and chronic kidney disease with heart failure and stage 1 through stage 4 chronic kidney disease, or unspecified chronic kidney disease: Secondary | ICD-10-CM | POA: Diagnosis not present

## 2021-01-23 DIAGNOSIS — N183 Chronic kidney disease, stage 3 unspecified: Secondary | ICD-10-CM | POA: Diagnosis not present

## 2021-01-24 DIAGNOSIS — I130 Hypertensive heart and chronic kidney disease with heart failure and stage 1 through stage 4 chronic kidney disease, or unspecified chronic kidney disease: Secondary | ICD-10-CM | POA: Diagnosis not present

## 2021-01-24 DIAGNOSIS — F0280 Dementia in other diseases classified elsewhere without behavioral disturbance: Secondary | ICD-10-CM | POA: Diagnosis not present

## 2021-01-24 DIAGNOSIS — G309 Alzheimer's disease, unspecified: Secondary | ICD-10-CM | POA: Diagnosis not present

## 2021-01-24 DIAGNOSIS — N183 Chronic kidney disease, stage 3 unspecified: Secondary | ICD-10-CM | POA: Diagnosis not present

## 2021-01-24 DIAGNOSIS — I672 Cerebral atherosclerosis: Secondary | ICD-10-CM | POA: Diagnosis not present

## 2021-01-24 DIAGNOSIS — I5032 Chronic diastolic (congestive) heart failure: Secondary | ICD-10-CM | POA: Diagnosis not present

## 2021-01-25 DIAGNOSIS — F0280 Dementia in other diseases classified elsewhere without behavioral disturbance: Secondary | ICD-10-CM | POA: Diagnosis not present

## 2021-01-25 DIAGNOSIS — I672 Cerebral atherosclerosis: Secondary | ICD-10-CM | POA: Diagnosis not present

## 2021-01-25 DIAGNOSIS — N183 Chronic kidney disease, stage 3 unspecified: Secondary | ICD-10-CM | POA: Diagnosis not present

## 2021-01-25 DIAGNOSIS — I5032 Chronic diastolic (congestive) heart failure: Secondary | ICD-10-CM | POA: Diagnosis not present

## 2021-01-25 DIAGNOSIS — G309 Alzheimer's disease, unspecified: Secondary | ICD-10-CM | POA: Diagnosis not present

## 2021-01-25 DIAGNOSIS — I130 Hypertensive heart and chronic kidney disease with heart failure and stage 1 through stage 4 chronic kidney disease, or unspecified chronic kidney disease: Secondary | ICD-10-CM | POA: Diagnosis not present

## 2021-01-26 DIAGNOSIS — I5032 Chronic diastolic (congestive) heart failure: Secondary | ICD-10-CM | POA: Diagnosis not present

## 2021-01-26 DIAGNOSIS — N183 Chronic kidney disease, stage 3 unspecified: Secondary | ICD-10-CM | POA: Diagnosis not present

## 2021-01-26 DIAGNOSIS — F0280 Dementia in other diseases classified elsewhere without behavioral disturbance: Secondary | ICD-10-CM | POA: Diagnosis not present

## 2021-01-26 DIAGNOSIS — I130 Hypertensive heart and chronic kidney disease with heart failure and stage 1 through stage 4 chronic kidney disease, or unspecified chronic kidney disease: Secondary | ICD-10-CM | POA: Diagnosis not present

## 2021-01-26 DIAGNOSIS — G309 Alzheimer's disease, unspecified: Secondary | ICD-10-CM | POA: Diagnosis not present

## 2021-01-26 DIAGNOSIS — I672 Cerebral atherosclerosis: Secondary | ICD-10-CM | POA: Diagnosis not present

## 2021-01-27 DIAGNOSIS — F0280 Dementia in other diseases classified elsewhere without behavioral disturbance: Secondary | ICD-10-CM | POA: Diagnosis not present

## 2021-01-27 DIAGNOSIS — N183 Chronic kidney disease, stage 3 unspecified: Secondary | ICD-10-CM | POA: Diagnosis not present

## 2021-01-27 DIAGNOSIS — I5032 Chronic diastolic (congestive) heart failure: Secondary | ICD-10-CM | POA: Diagnosis not present

## 2021-01-27 DIAGNOSIS — I672 Cerebral atherosclerosis: Secondary | ICD-10-CM | POA: Diagnosis not present

## 2021-01-27 DIAGNOSIS — G309 Alzheimer's disease, unspecified: Secondary | ICD-10-CM | POA: Diagnosis not present

## 2021-01-27 DIAGNOSIS — I130 Hypertensive heart and chronic kidney disease with heart failure and stage 1 through stage 4 chronic kidney disease, or unspecified chronic kidney disease: Secondary | ICD-10-CM | POA: Diagnosis not present

## 2021-02-06 DEATH — deceased

## 2021-02-20 ENCOUNTER — Encounter: Payer: Self-pay | Admitting: Podiatry

## 2021-02-21 NOTE — Telephone Encounter (Signed)
Patient is deceased.

## 2021-04-24 ENCOUNTER — Ambulatory Visit: Payer: BLUE CROSS/BLUE SHIELD | Admitting: Interventional Cardiology

## 2021-07-01 ENCOUNTER — Ambulatory Visit: Payer: BLUE CROSS/BLUE SHIELD | Admitting: Neurology

## 2021-07-03 ENCOUNTER — Ambulatory Visit: Payer: Medicare Other | Admitting: Neurology
